# Patient Record
Sex: Female | Born: 1945
Health system: Southern US, Community
[De-identification: ages and names within clinical notes are randomized; demographics above are authoritative.]

## PROBLEM LIST (undated history)

## (undated) DIAGNOSIS — K5790 Diverticulosis of intestine, part unspecified, without perforation or abscess without bleeding: Secondary | ICD-10-CM

## (undated) DIAGNOSIS — K219 Gastro-esophageal reflux disease without esophagitis: Secondary | ICD-10-CM

## (undated) DIAGNOSIS — H269 Unspecified cataract: Secondary | ICD-10-CM

## (undated) DIAGNOSIS — M1990 Unspecified osteoarthritis, unspecified site: Secondary | ICD-10-CM

## (undated) DIAGNOSIS — T8859XA Other complications of anesthesia, initial encounter: Secondary | ICD-10-CM

## (undated) DIAGNOSIS — I10 Essential (primary) hypertension: Secondary | ICD-10-CM

## (undated) DIAGNOSIS — L80 Vitiligo: Secondary | ICD-10-CM

## (undated) DIAGNOSIS — K200 Eosinophilic esophagitis: Secondary | ICD-10-CM

## (undated) DIAGNOSIS — E079 Disorder of thyroid, unspecified: Secondary | ICD-10-CM

## (undated) DIAGNOSIS — Z9889 Other specified postprocedural states: Secondary | ICD-10-CM

## (undated) DIAGNOSIS — Z9289 Personal history of other medical treatment: Secondary | ICD-10-CM

## (undated) DIAGNOSIS — M8580 Other specified disorders of bone density and structure, unspecified site: Secondary | ICD-10-CM

## (undated) DIAGNOSIS — M62838 Other muscle spasm: Secondary | ICD-10-CM

## (undated) DIAGNOSIS — E785 Hyperlipidemia, unspecified: Secondary | ICD-10-CM

## (undated) DIAGNOSIS — T7840XA Allergy, unspecified, initial encounter: Secondary | ICD-10-CM

## (undated) HISTORY — PX: ABDOMINAL HYSTERECTOMY: SHX81

## (undated) HISTORY — DX: Hyperlipidemia, unspecified: E78.5

## (undated) HISTORY — PX: COLONOSCOPY: SHX174

## (undated) HISTORY — DX: Disorder of thyroid, unspecified: E07.9

## (undated) HISTORY — DX: Essential (primary) hypertension: I10

## (undated) HISTORY — PX: FRACTURE SURGERY: SHX138

## (undated) HISTORY — PX: BREAST SURGERY: SHX581

## (undated) HISTORY — DX: Unspecified osteoarthritis, unspecified site: M19.90

## (undated) HISTORY — PX: CHOLECYSTECTOMY: SHX55

## (undated) HISTORY — PX: TUBAL LIGATION: SHX77

## (undated) HISTORY — PX: ROTATOR CUFF REPAIR: SHX139

## (undated) HISTORY — DX: Unspecified cataract: H26.9

## (undated) HISTORY — PX: ESOPHAGEAL DILATION: SHX303

## (undated) HISTORY — DX: Gastro-esophageal reflux disease without esophagitis: K21.9

## (undated) HISTORY — DX: Allergy, unspecified, initial encounter: T78.40XA

## (undated) HISTORY — PX: APPENDECTOMY: SHX54

## (undated) HISTORY — DX: Diverticulosis of intestine, part unspecified, without perforation or abscess without bleeding: K57.90

## (undated) HISTORY — PX: UPPER GASTROINTESTINAL ENDOSCOPY: SHX188

## (undated) HISTORY — DX: Eosinophilic esophagitis: K20.0

## (undated) HISTORY — PX: BREAST EXCISIONAL BIOPSY: SUR124

## (undated) HISTORY — PX: EYE SURGERY: SHX253

## (undated) HISTORY — DX: Vitiligo: L80

## (undated) HISTORY — DX: Other muscle spasm: M62.838

## (undated) HISTORY — DX: Other specified disorders of bone density and structure, unspecified site: M85.80

## (undated) HISTORY — DX: Personal history of other medical treatment: Z92.89

---

## 1966-08-12 DIAGNOSIS — Z9289 Personal history of other medical treatment: Secondary | ICD-10-CM

## 1966-08-12 HISTORY — DX: Personal history of other medical treatment: Z92.89

## 1970-08-12 HISTORY — PX: BREAST EXCISIONAL BIOPSY: SUR124

## 1990-08-12 DIAGNOSIS — T7840XA Allergy, unspecified, initial encounter: Secondary | ICD-10-CM

## 1990-08-12 HISTORY — DX: Allergy, unspecified, initial encounter: T78.40XA

## 1997-12-21 ENCOUNTER — Other Ambulatory Visit: Admission: RE | Admit: 1997-12-21 | Discharge: 1997-12-21 | Payer: Self-pay | Admitting: Obstetrics and Gynecology

## 1998-12-26 ENCOUNTER — Other Ambulatory Visit: Admission: RE | Admit: 1998-12-26 | Discharge: 1998-12-26 | Payer: Self-pay | Admitting: Obstetrics and Gynecology

## 1999-12-27 ENCOUNTER — Other Ambulatory Visit: Admission: RE | Admit: 1999-12-27 | Discharge: 1999-12-27 | Payer: Self-pay | Admitting: Obstetrics and Gynecology

## 2000-09-10 ENCOUNTER — Ambulatory Visit (HOSPITAL_COMMUNITY): Admission: RE | Admit: 2000-09-10 | Discharge: 2000-09-10 | Payer: Self-pay | Admitting: Gastroenterology

## 2000-09-10 ENCOUNTER — Encounter: Payer: Self-pay | Admitting: Gastroenterology

## 2001-04-24 ENCOUNTER — Ambulatory Visit (HOSPITAL_COMMUNITY): Admission: RE | Admit: 2001-04-24 | Discharge: 2001-04-24 | Payer: Self-pay | Admitting: Internal Medicine

## 2001-04-24 ENCOUNTER — Encounter: Payer: Self-pay | Admitting: Internal Medicine

## 2001-05-01 ENCOUNTER — Other Ambulatory Visit: Admission: RE | Admit: 2001-05-01 | Discharge: 2001-05-01 | Payer: Self-pay | Admitting: Obstetrics and Gynecology

## 2002-06-13 ENCOUNTER — Encounter: Admission: RE | Admit: 2002-06-13 | Discharge: 2002-06-13 | Payer: Self-pay | Admitting: Orthopedic Surgery

## 2002-06-13 ENCOUNTER — Encounter: Payer: Self-pay | Admitting: Orthopedic Surgery

## 2002-07-05 ENCOUNTER — Other Ambulatory Visit: Admission: RE | Admit: 2002-07-05 | Discharge: 2002-07-05 | Payer: Self-pay | Admitting: Obstetrics and Gynecology

## 2003-07-14 ENCOUNTER — Other Ambulatory Visit: Admission: RE | Admit: 2003-07-14 | Discharge: 2003-07-14 | Payer: Self-pay | Admitting: Obstetrics and Gynecology

## 2003-09-26 ENCOUNTER — Encounter: Payer: Self-pay | Admitting: Gastroenterology

## 2003-11-15 ENCOUNTER — Encounter: Payer: Self-pay | Admitting: Internal Medicine

## 2003-11-25 ENCOUNTER — Encounter: Admission: RE | Admit: 2003-11-25 | Discharge: 2003-11-25 | Payer: Self-pay | Admitting: Internal Medicine

## 2004-03-20 ENCOUNTER — Encounter: Payer: Self-pay | Admitting: Internal Medicine

## 2004-06-27 ENCOUNTER — Ambulatory Visit: Payer: Self-pay | Admitting: Internal Medicine

## 2004-08-22 ENCOUNTER — Other Ambulatory Visit: Admission: RE | Admit: 2004-08-22 | Discharge: 2004-08-22 | Payer: Self-pay | Admitting: Obstetrics and Gynecology

## 2004-08-30 ENCOUNTER — Ambulatory Visit: Payer: Self-pay | Admitting: Internal Medicine

## 2004-09-11 ENCOUNTER — Ambulatory Visit: Payer: Self-pay | Admitting: Internal Medicine

## 2004-10-29 ENCOUNTER — Ambulatory Visit: Payer: Self-pay | Admitting: Family Medicine

## 2004-12-19 ENCOUNTER — Ambulatory Visit: Payer: Self-pay | Admitting: Internal Medicine

## 2004-12-25 ENCOUNTER — Ambulatory Visit: Payer: Self-pay | Admitting: Internal Medicine

## 2005-03-27 ENCOUNTER — Ambulatory Visit: Payer: Self-pay | Admitting: Internal Medicine

## 2005-04-03 ENCOUNTER — Ambulatory Visit: Payer: Self-pay | Admitting: Internal Medicine

## 2005-04-29 ENCOUNTER — Ambulatory Visit: Payer: Self-pay | Admitting: Gastroenterology

## 2005-05-23 ENCOUNTER — Ambulatory Visit: Payer: Self-pay | Admitting: Internal Medicine

## 2005-09-04 ENCOUNTER — Other Ambulatory Visit: Admission: RE | Admit: 2005-09-04 | Discharge: 2005-09-04 | Payer: Self-pay | Admitting: Obstetrics and Gynecology

## 2005-10-07 ENCOUNTER — Ambulatory Visit: Payer: Self-pay | Admitting: Gastroenterology

## 2005-10-21 ENCOUNTER — Ambulatory Visit: Payer: Self-pay | Admitting: Gastroenterology

## 2005-10-21 LAB — HM COLONOSCOPY

## 2005-11-19 ENCOUNTER — Ambulatory Visit: Payer: Self-pay | Admitting: Internal Medicine

## 2005-11-22 ENCOUNTER — Ambulatory Visit: Payer: Self-pay | Admitting: Internal Medicine

## 2006-01-15 ENCOUNTER — Ambulatory Visit: Payer: Self-pay | Admitting: Internal Medicine

## 2006-01-30 ENCOUNTER — Ambulatory Visit: Payer: Self-pay | Admitting: Internal Medicine

## 2006-02-03 ENCOUNTER — Ambulatory Visit: Payer: Self-pay | Admitting: Internal Medicine

## 2006-12-31 DIAGNOSIS — M899 Disorder of bone, unspecified: Secondary | ICD-10-CM

## 2006-12-31 DIAGNOSIS — M949 Disorder of cartilage, unspecified: Secondary | ICD-10-CM

## 2006-12-31 DIAGNOSIS — L80 Vitiligo: Secondary | ICD-10-CM

## 2007-01-07 ENCOUNTER — Ambulatory Visit: Payer: Self-pay | Admitting: Internal Medicine

## 2007-01-07 ENCOUNTER — Encounter: Payer: Self-pay | Admitting: Internal Medicine

## 2007-01-07 LAB — CONVERTED CEMR LAB
ALT: 19 units/L (ref 0–40)
AST: 21 units/L (ref 0–37)
Albumin: 3.9 g/dL (ref 3.5–5.2)
Alkaline Phosphatase: 73 units/L (ref 39–117)
BUN: 9 mg/dL (ref 6–23)
Basophils Absolute: 0 10*3/uL (ref 0.0–0.1)
Basophils Relative: 0.1 % (ref 0.0–1.0)
Bilirubin, Direct: 0.1 mg/dL (ref 0.0–0.3)
CO2: 32 meq/L (ref 19–32)
Calcium: 8.9 mg/dL (ref 8.4–10.5)
Chloride: 105 meq/L (ref 96–112)
Cholesterol: 179 mg/dL (ref 0–200)
Creatinine, Ser: 0.8 mg/dL (ref 0.4–1.2)
Eosinophils Absolute: 0.2 10*3/uL (ref 0.0–0.6)
Eosinophils Relative: 3.3 % (ref 0.0–5.0)
GFR calc Af Amer: 94 mL/min
GFR calc non Af Amer: 78 mL/min
Glucose, Bld: 104 mg/dL — ABNORMAL HIGH (ref 70–99)
HCT: 43 % (ref 36.0–46.0)
HDL: 53.3 mg/dL (ref >39.0)
Hemoglobin: 14.7 g/dL (ref 12.0–15.0)
Hgb A1c MFr Bld: 5.7 % (ref 4.6–6.0)
LDL Cholesterol: 106 mg/dL — ABNORMAL HIGH (ref 0–99)
Lymphocytes Relative: 23.2 % (ref 12.0–46.0)
MCHC: 34.2 g/dL (ref 30.0–36.0)
MCV: 89.2 fL (ref 78.0–100.0)
Monocytes Absolute: 0.7 10*3/uL (ref 0.2–0.7)
Monocytes Relative: 10.7 % (ref 3.0–11.0)
Neutro Abs: 3.9 10*3/uL (ref 1.4–7.7)
Neutrophils Relative %: 62.7 % (ref 43.0–77.0)
Platelets: 286 10*3/uL (ref 150–400)
Potassium: 4 meq/L (ref 3.5–5.1)
RBC: 4.82 M/uL (ref 3.87–5.11)
RDW: 12.1 % (ref 11.5–14.6)
Sodium: 141 meq/L (ref 135–145)
TSH: 4.77 microintl units/mL (ref 0.35–5.50)
Total Bilirubin: 0.7 mg/dL (ref 0.3–1.2)
Total CHOL/HDL Ratio: 3.4
Total Protein: 6.7 g/dL (ref 6.0–8.3)
Triglycerides: 99 mg/dL (ref 0–149)
VLDL: 20 mg/dL (ref 0–40)
WBC: 6.3 10*3/uL (ref 4.5–10.5)

## 2007-01-19 ENCOUNTER — Ambulatory Visit: Payer: Self-pay | Admitting: Gastroenterology

## 2007-01-21 ENCOUNTER — Ambulatory Visit: Payer: Self-pay | Admitting: Internal Medicine

## 2007-01-21 DIAGNOSIS — L259 Unspecified contact dermatitis, unspecified cause: Secondary | ICD-10-CM

## 2007-01-23 ENCOUNTER — Telehealth (INDEPENDENT_AMBULATORY_CARE_PROVIDER_SITE_OTHER): Payer: Self-pay | Admitting: *Deleted

## 2007-01-27 ENCOUNTER — Telehealth (INDEPENDENT_AMBULATORY_CARE_PROVIDER_SITE_OTHER): Payer: Self-pay | Admitting: *Deleted

## 2007-12-10 ENCOUNTER — Ambulatory Visit: Payer: Self-pay | Admitting: Internal Medicine

## 2007-12-26 DIAGNOSIS — K219 Gastro-esophageal reflux disease without esophagitis: Secondary | ICD-10-CM | POA: Insufficient documentation

## 2007-12-26 DIAGNOSIS — K5730 Diverticulosis of large intestine without perforation or abscess without bleeding: Secondary | ICD-10-CM

## 2007-12-26 DIAGNOSIS — E785 Hyperlipidemia, unspecified: Secondary | ICD-10-CM

## 2007-12-26 DIAGNOSIS — E039 Hypothyroidism, unspecified: Secondary | ICD-10-CM

## 2007-12-26 DIAGNOSIS — K319 Disease of stomach and duodenum, unspecified: Secondary | ICD-10-CM

## 2007-12-26 DIAGNOSIS — I10 Essential (primary) hypertension: Secondary | ICD-10-CM | POA: Insufficient documentation

## 2007-12-28 ENCOUNTER — Encounter (INDEPENDENT_AMBULATORY_CARE_PROVIDER_SITE_OTHER): Payer: Self-pay | Admitting: *Deleted

## 2008-02-09 ENCOUNTER — Telehealth: Payer: Self-pay | Admitting: Gastroenterology

## 2008-02-10 ENCOUNTER — Telehealth (INDEPENDENT_AMBULATORY_CARE_PROVIDER_SITE_OTHER): Payer: Self-pay | Admitting: *Deleted

## 2008-03-01 ENCOUNTER — Ambulatory Visit: Payer: Self-pay | Admitting: Internal Medicine

## 2008-03-07 ENCOUNTER — Telehealth: Payer: Self-pay | Admitting: Gastroenterology

## 2008-03-16 ENCOUNTER — Telehealth (INDEPENDENT_AMBULATORY_CARE_PROVIDER_SITE_OTHER): Payer: Self-pay | Admitting: *Deleted

## 2008-04-13 ENCOUNTER — Ambulatory Visit: Payer: Self-pay | Admitting: Gastroenterology

## 2008-04-13 ENCOUNTER — Encounter: Payer: Self-pay | Admitting: Internal Medicine

## 2008-05-05 ENCOUNTER — Ambulatory Visit: Payer: Self-pay | Admitting: Internal Medicine

## 2008-05-17 ENCOUNTER — Telehealth (INDEPENDENT_AMBULATORY_CARE_PROVIDER_SITE_OTHER): Payer: Self-pay | Admitting: *Deleted

## 2008-05-17 LAB — CONVERTED CEMR LAB
ALT: 28 units/L (ref 0–35)
AST: 29 units/L (ref 0–37)
Albumin: 3.6 g/dL (ref 3.5–5.2)
Alkaline Phosphatase: 71 units/L (ref 39–117)
BUN: 9 mg/dL (ref 6–23)
Basophils Absolute: 0 10*3/uL (ref 0.0–0.1)
Basophils Relative: 0.9 % (ref 0.0–3.0)
Bilirubin, Direct: 0.1 mg/dL (ref 0.0–0.3)
CO2: 30 meq/L (ref 19–32)
Calcium: 9 mg/dL (ref 8.4–10.5)
Chloride: 108 meq/L (ref 96–112)
Cholesterol: 134 mg/dL (ref 0–200)
Creatinine, Ser: 0.7 mg/dL (ref 0.4–1.2)
Eosinophils Absolute: 0.3 10*3/uL (ref 0.0–0.7)
Eosinophils Relative: 6 % — ABNORMAL HIGH (ref 0.0–5.0)
GFR calc Af Amer: 109 mL/min
GFR calc non Af Amer: 90 mL/min
Glucose, Bld: 97 mg/dL (ref 70–99)
HCT: 40 % (ref 36.0–46.0)
HDL: 42.4 mg/dL (ref >39.0)
Hemoglobin: 13.8 g/dL (ref 12.0–15.0)
Hgb A1c MFr Bld: 5.6 % (ref 4.6–6.0)
LDL Cholesterol: 74 mg/dL (ref 0–99)
Lymphocytes Relative: 25.3 % (ref 12.0–46.0)
MCHC: 34.4 g/dL (ref 30.0–36.0)
MCV: 89.5 fL (ref 78.0–100.0)
Monocytes Absolute: 0.7 10*3/uL (ref 0.1–1.0)
Monocytes Relative: 13.5 % — ABNORMAL HIGH (ref 3.0–12.0)
Neutro Abs: 3 10*3/uL (ref 1.4–7.7)
Neutrophils Relative %: 54.3 % (ref 43.0–77.0)
Platelets: 251 10*3/uL (ref 150–400)
Potassium: 4.3 meq/L (ref 3.5–5.1)
RBC: 4.47 M/uL (ref 3.87–5.11)
RDW: 13.1 % (ref 11.5–14.6)
Sodium: 144 meq/L (ref 135–145)
TSH: 1.95 microintl units/mL (ref 0.35–5.50)
Total Bilirubin: 0.6 mg/dL (ref 0.3–1.2)
Total CHOL/HDL Ratio: 3.2
Total Protein: 6.5 g/dL (ref 6.0–8.3)
Triglycerides: 86 mg/dL (ref 0–149)
VLDL: 17 mg/dL (ref 0–40)
WBC: 5.3 10*3/uL (ref 4.5–10.5)

## 2008-05-18 ENCOUNTER — Encounter (INDEPENDENT_AMBULATORY_CARE_PROVIDER_SITE_OTHER): Payer: Self-pay | Admitting: *Deleted

## 2008-08-15 ENCOUNTER — Telehealth (INDEPENDENT_AMBULATORY_CARE_PROVIDER_SITE_OTHER): Payer: Self-pay | Admitting: *Deleted

## 2008-08-31 ENCOUNTER — Ambulatory Visit: Payer: Self-pay | Admitting: Internal Medicine

## 2009-03-15 ENCOUNTER — Telehealth (INDEPENDENT_AMBULATORY_CARE_PROVIDER_SITE_OTHER): Payer: Self-pay | Admitting: *Deleted

## 2009-05-08 ENCOUNTER — Ambulatory Visit: Payer: Self-pay | Admitting: Internal Medicine

## 2009-05-08 LAB — CONVERTED CEMR LAB
ALT: 19 units/L (ref 0–35)
AST: 24 units/L (ref 0–37)
Albumin: 3.9 g/dL (ref 3.5–5.2)
Alkaline Phosphatase: 74 units/L (ref 39–117)
BUN: 11 mg/dL (ref 6–23)
Basophils Absolute: 0 10*3/uL (ref 0.0–0.1)
Basophils Relative: 0.6 % (ref 0.0–3.0)
Bilirubin, Direct: 0 mg/dL (ref 0.0–0.3)
CO2: 32 meq/L (ref 19–32)
Calcium: 9 mg/dL (ref 8.4–10.5)
Chloride: 106 meq/L (ref 96–112)
Cholesterol: 136 mg/dL (ref 0–200)
Creatinine, Ser: 0.7 mg/dL (ref 0.4–1.2)
Eosinophils Absolute: 0.4 10*3/uL (ref 0.0–0.7)
Eosinophils Relative: 6.9 % — ABNORMAL HIGH (ref 0.0–5.0)
GFR calc non Af Amer: 89.57 mL/min (ref >60)
Glucose, Bld: 92 mg/dL (ref 70–99)
HCT: 42.5 % (ref 36.0–46.0)
HDL: 41 mg/dL (ref >39.00)
Hemoglobin: 14.4 g/dL (ref 12.0–15.0)
LDL Cholesterol: 72 mg/dL (ref 0–99)
Lymphocytes Relative: 25.8 % (ref 12.0–46.0)
Lymphs Abs: 1.5 10*3/uL (ref 0.7–4.0)
MCHC: 34 g/dL (ref 30.0–36.0)
MCV: 89.7 fL (ref 78.0–100.0)
Monocytes Absolute: 0.6 10*3/uL (ref 0.1–1.0)
Monocytes Relative: 10.1 % (ref 3.0–12.0)
Neutro Abs: 3.2 10*3/uL (ref 1.4–7.7)
Neutrophils Relative %: 56.6 % (ref 43.0–77.0)
Platelets: 231 10*3/uL (ref 150.0–400.0)
Potassium: 4 meq/L (ref 3.5–5.1)
RBC: 4.73 M/uL (ref 3.87–5.11)
RDW: 12.5 % (ref 11.5–14.6)
Sodium: 142 meq/L (ref 135–145)
TSH: 7.64 microintl units/mL — ABNORMAL HIGH (ref 0.35–5.50)
Total Bilirubin: 0.7 mg/dL (ref 0.3–1.2)
Total CHOL/HDL Ratio: 3
Total Protein: 6.9 g/dL (ref 6.0–8.3)
Triglycerides: 117 mg/dL (ref 0.0–149.0)
VLDL: 23.4 mg/dL (ref 0.0–40.0)
WBC: 5.7 10*3/uL (ref 4.5–10.5)

## 2009-05-12 ENCOUNTER — Telehealth (INDEPENDENT_AMBULATORY_CARE_PROVIDER_SITE_OTHER): Payer: Self-pay | Admitting: *Deleted

## 2009-05-16 ENCOUNTER — Ambulatory Visit: Payer: Self-pay | Admitting: Internal Medicine

## 2009-05-16 DIAGNOSIS — E559 Vitamin D deficiency, unspecified: Secondary | ICD-10-CM

## 2009-05-29 ENCOUNTER — Telehealth: Payer: Self-pay | Admitting: Gastroenterology

## 2009-05-31 ENCOUNTER — Encounter (INDEPENDENT_AMBULATORY_CARE_PROVIDER_SITE_OTHER): Payer: Self-pay | Admitting: *Deleted

## 2009-05-31 ENCOUNTER — Ambulatory Visit: Payer: Self-pay | Admitting: Gastroenterology

## 2009-05-31 DIAGNOSIS — R1319 Other dysphagia: Secondary | ICD-10-CM

## 2009-06-26 ENCOUNTER — Emergency Department: Payer: Self-pay | Admitting: Emergency Medicine

## 2009-06-28 ENCOUNTER — Telehealth: Payer: Self-pay | Admitting: Gastroenterology

## 2009-06-30 ENCOUNTER — Ambulatory Visit: Payer: Self-pay | Admitting: Gastroenterology

## 2009-07-11 ENCOUNTER — Encounter: Payer: Self-pay | Admitting: Gastroenterology

## 2009-07-11 ENCOUNTER — Ambulatory Visit: Payer: Self-pay | Admitting: Internal Medicine

## 2009-07-19 ENCOUNTER — Encounter (INDEPENDENT_AMBULATORY_CARE_PROVIDER_SITE_OTHER): Payer: Self-pay | Admitting: *Deleted

## 2009-07-19 LAB — CONVERTED CEMR LAB: Vit D, 25-Hydroxy: 39 ng/mL (ref 30–89)

## 2009-07-24 LAB — CONVERTED CEMR LAB: TSH: 2.45 microintl units/mL (ref 0.35–5.50)

## 2009-07-26 ENCOUNTER — Ambulatory Visit: Payer: Self-pay | Admitting: Internal Medicine

## 2009-08-02 ENCOUNTER — Telehealth (INDEPENDENT_AMBULATORY_CARE_PROVIDER_SITE_OTHER): Payer: Self-pay | Admitting: *Deleted

## 2009-09-21 ENCOUNTER — Telehealth (INDEPENDENT_AMBULATORY_CARE_PROVIDER_SITE_OTHER): Payer: Self-pay | Admitting: *Deleted

## 2009-12-19 ENCOUNTER — Encounter: Payer: Self-pay | Admitting: Gastroenterology

## 2010-03-02 ENCOUNTER — Telehealth (INDEPENDENT_AMBULATORY_CARE_PROVIDER_SITE_OTHER): Payer: Self-pay | Admitting: *Deleted

## 2010-03-22 ENCOUNTER — Ambulatory Visit: Payer: Self-pay | Admitting: Internal Medicine

## 2010-03-22 ENCOUNTER — Encounter: Payer: Self-pay | Admitting: Internal Medicine

## 2010-05-07 ENCOUNTER — Ambulatory Visit: Payer: Self-pay | Admitting: Internal Medicine

## 2010-05-08 LAB — CONVERTED CEMR LAB
ALT: 20 units/L (ref 0–35)
AST: 22 units/L (ref 0–37)
Albumin: 3.8 g/dL (ref 3.5–5.2)
Alkaline Phosphatase: 79 units/L (ref 39–117)
BUN: 13 mg/dL (ref 6–23)
Basophils Absolute: 0.1 10*3/uL (ref 0.0–0.1)
Basophils Relative: 1.4 % (ref 0.0–3.0)
Bilirubin, Direct: 0.1 mg/dL (ref 0.0–0.3)
CO2: 32 meq/L (ref 19–32)
Calcium: 9.3 mg/dL (ref 8.4–10.5)
Chloride: 103 meq/L (ref 96–112)
Cholesterol: 141 mg/dL (ref 0–200)
Creatinine, Ser: 0.6 mg/dL (ref 0.4–1.2)
Eosinophils Absolute: 0.4 10*3/uL (ref 0.0–0.7)
Eosinophils Relative: 8.5 % — ABNORMAL HIGH (ref 0.0–5.0)
GFR calc non Af Amer: 99.02 mL/min (ref >60)
Glucose, Bld: 86 mg/dL (ref 70–99)
HCT: 42.5 % (ref 36.0–46.0)
HDL: 41.4 mg/dL (ref >39.00)
Hemoglobin: 14.5 g/dL (ref 12.0–15.0)
LDL Cholesterol: 79 mg/dL (ref 0–99)
Lymphocytes Relative: 31.8 % (ref 12.0–46.0)
Lymphs Abs: 1.6 10*3/uL (ref 0.7–4.0)
MCHC: 34.1 g/dL (ref 30.0–36.0)
MCV: 89.2 fL (ref 78.0–100.0)
Monocytes Absolute: 0.6 10*3/uL (ref 0.1–1.0)
Monocytes Relative: 11.8 % (ref 3.0–12.0)
Neutro Abs: 2.4 10*3/uL (ref 1.4–7.7)
Neutrophils Relative %: 46.5 % (ref 43.0–77.0)
Platelets: 236 10*3/uL (ref 150.0–400.0)
Potassium: 5 meq/L (ref 3.5–5.1)
RBC: 4.77 M/uL (ref 3.87–5.11)
RDW: 13.7 % (ref 11.5–14.6)
Sodium: 140 meq/L (ref 135–145)
TSH: 7.84 microintl units/mL — ABNORMAL HIGH (ref 0.35–5.50)
Total Bilirubin: 0.2 mg/dL — ABNORMAL LOW (ref 0.3–1.2)
Total CHOL/HDL Ratio: 3
Total Protein: 6.4 g/dL (ref 6.0–8.3)
Triglycerides: 101 mg/dL (ref 0.0–149.0)
VLDL: 20.2 mg/dL (ref 0.0–40.0)
WBC: 5.2 10*3/uL (ref 4.5–10.5)

## 2010-05-21 ENCOUNTER — Encounter: Payer: Self-pay | Admitting: Internal Medicine

## 2010-05-21 ENCOUNTER — Ambulatory Visit: Payer: Self-pay | Admitting: Internal Medicine

## 2010-05-21 DIAGNOSIS — M5010 Cervical disc disorder with radiculopathy, unspecified cervical region: Secondary | ICD-10-CM

## 2010-05-21 LAB — CONVERTED CEMR LAB
HDL goal, serum: 40 mg/dL
LDL Goal: 130 mg/dL

## 2010-06-20 ENCOUNTER — Telehealth: Payer: Self-pay | Admitting: Gastroenterology

## 2010-06-21 ENCOUNTER — Ambulatory Visit: Payer: Self-pay | Admitting: Internal Medicine

## 2010-08-01 ENCOUNTER — Ambulatory Visit: Payer: Self-pay | Admitting: Gastroenterology

## 2010-09-11 NOTE — Progress Notes (Signed)
Summary: cretore refill   Phone Note Refill Request Message from:  Fax from Pharmacy on [facility] FAX [phone]  Refills Requested: Medication #1:  CRESTOR 20 MG TABS 1/2 tab qd Initial call taken by: Barb Merino,  September 21, 2009 8:46 AM    Prescriptions: CRESTOR 20 MG TABS (ROSUVASTATIN CALCIUM) 1/2 tab qd  #90 x 1   Entered by:   Doristine Devoid   Authorized by:   Marga Melnick MD   Signed by:   Doristine Devoid on 09/21/2009   Method used:   Faxed to ...       [facility] (mail[facility]order)       [address] [place], [place]  [address]       Ph: [phone]       Fax: [phone]   RxID:   [id number]

## 2010-09-11 NOTE — Assessment & Plan Note (Signed)
Summary: PNEMONIA SHOT/KN   Nurse Visit  CC: Pneumo vacc./kb   Allergies: 1)  ! * Boniva 2)  ! Fosamax (Alendronate Sodium)  Immunizations Administered:  Pneumonia Vaccine:    Vaccine Type: Pneumovax    Site: left deltoid    Mfr: Merck    Dose: 0.5 ml    Route: IM    Given by: Lucious Groves CMA    Exp. Date: 10/27/2011    Lot #: [id number]    VIS given: 07/17/09 version given June 21, 2010.  Orders Added: 1)  Pneumococcal Vaccine [90732] 2)  Admin 1st Vaccine [04540]

## 2010-09-11 NOTE — Medication Information (Signed)
Summary: Prilosec Approved/[facility]  Prilosec Approved/[facility]   Imported By: Sherian Rein 12/27/2009 14:30:02  _____________________________________________________________________  External Attachment:    Type:   Image     Comment:   External Document

## 2010-09-11 NOTE — Progress Notes (Signed)
Summary: Medication Refill  Medications Added OMEPRAZOLE 40 MG CPDR (OMEPRAZOLE) one tablet by mouth once daily       Phone Note Call from Patient Call back at Home Phone [phone]   Caller: Patient Call For: Dr. Russella Dar Reason for Call: Refill Medication Details for Reason: Medication Refill Summary of Call: Pt. needs new prescription for Omeprazole as her insurance is changing. She requests that it be written for 90 days and also asks if she can come by and pick up a written prescription instead of calling it into her pharmacy. Please call pt. when the prescription is ready to be picked up.  Thank you. Initial call taken by: Schuyler Amor,  June 20, 2010 10:08 AM  Follow-up for Phone Call        Left message for patient  to call back and discuss omeprazole refills and scheduling her yearly follow up visit.  Follow-up by: Christie Nottingham CMA Duncan Dull),  June 20, 2010 1:37 PM  Additional Follow-up for Phone Call Additional follow up Details #1::        Scheduled appt on 08/01/10 at 10:00am and pt's wants a printed prescription for a one month supply until her office visit. Printed and left out front for pt to pick up. Additional Follow-up by: Christie Nottingham CMA Duncan Dull),  June 20, 2010 3:03 PM    New/Updated Medications: OMEPRAZOLE 40 MG CPDR (OMEPRAZOLE) one tablet by mouth once daily Prescriptions: OMEPRAZOLE 40 MG CPDR (OMEPRAZOLE) one tablet by mouth once daily  #30 x 0   Entered by:   Christie Nottingham CMA (AAMA)   Authorized by:   Meryl Dare MD [facility]   Signed by:   Christie Nottingham CMA (AAMA) on 06/20/2010   Method used:   Print then Give to Patient   RxID:   [id number]

## 2010-09-11 NOTE — Progress Notes (Signed)
Summary: ORDERS FOR LABS TO BE INTO THE SLOT  Phone Note Call from Patient   Summary of Call: PT CALL AND HAD HER CPX CHANGE FROM NOV-2011 TO 05/17/10 AND THEN MADE AN APPT FOR LABS TO BE DONE 05-07-2010 NEED ORDERS TO BE PUTINTHE SLOT FOR THAT DAY AND SHE KNOWS SHE NEED TO BE FASTING. Initial call taken by: Freddy Jaksch,  March 02, 2010 10:20 AM  Follow-up for Phone Call        TLB-Lipid Panel [80061-LIPID] TLB-BMP (Basic Metabolic Panel-BMET) [80048-METABOL] TLB-TSH (Thyroid Stimulating Hormone) [84443-TSH] TLB-CBC Platelet - w/Differential [85025-CBCD] TLB-Hepatic/Liver Function Pnl [80076-HEPATIC] Stool Cards V70.0/ 244.9/ 272.4/ 401.9/ 995.20 Follow-up by: Shonna Chock CMA,  March 05, 2010 1:50 PM  Additional Follow-up for Phone Call Additional follow up Details #1::        oders place in patient chart. Additional Follow-up by: Freddy Jaksch,  March 07, 2010 7:36 AM

## 2010-09-11 NOTE — Miscellaneous (Signed)
Summary: BONE DENSITY  Clinical Lists Changes  Orders: Added new Test order of T-Bone Densitometry (77080) - Signed Added new Test order of T-Lumbar Vertebral Assessment (77082) - Signed 

## 2010-09-11 NOTE — Assessment & Plan Note (Signed)
Summary: cpx/fasting//kn   Vital Signs:  Patient profile:   65 year old female Height:      63.25 inches Weight:      147 pounds BMI:     25.93 Temp:     98.5 degrees F oral Pulse rate:   76 / minute Resp:     14 per minute BP sitting:   136 / 88  (left arm) Cuff size:   large  Vitals Entered By: Shonna Chock CMA (May 21, 2010 1:07 PM)   Primary Care Provider:  Marga Melnick, MD  CC:  Heartburn.  History of Present Illness: Crystal Kidd is here for a physical; her only concern is intermittent numbness  in R thigh, better with ambulation  for past  6-7 months.      She also  presents  for ERD F/U.  The patient reports some  acid reflux, but denies sour taste in mouth, epigastric pain, chest pain, trouble swallowing, weight loss, and weight gain.  The patient denies the following alarm features: melena, dysphagia, hematemesis, and vomiting.  Symptoms are worse with citrus.  Prior evaluation has included EGD ( see PMH).  The patient has found the following treatments to be effective: a PPI.        The patient also presents for Hyperlipidemia follow-up.  The patient reports muscle aches, but denies flushing, itching, constipation, diarrhea, and fatigue.  The patient denies the following symptoms: exercise intolerance, dypsnea, palpitations, syncope, and pedal edema.  Compliance with medications (by patient report) has been near 100%.  Dietary compliance has been good.       The patient also presents for Hypertension follow-up.  The patient denies lightheadedness, urinary frequency, and headaches.  Adjunctive measures currently used by the patient include  modified salt restriction.  Off meds BP averages 118/70-128/80 @ home.  Lipid Management History:      Positive NCEP/ATP III risk factors include female age 54 years old or older and hypertension.  Negative NCEP/ATP III risk factors include non-diabetic, no family history for ischemic heart disease, non-tobacco-user status, no ASHD  (atherosclerotic heart disease), no prior stroke/TIA, no peripheral vascular disease, and no history of aortic aneurysm.     Current Medications (verified): 1)  Omeprazole 40 Mg Cpdr (Omeprazole) .... One Tablet By Mouth Once Daily 2)  Evista 60 Mg Tabs (Raloxifene Hcl) .Marland Kitchen.. 1 By Mouth Qd 3)  Multivitamin With Iron 4)  Hydrochlorothiazide 25 Mg Tabs (Hydrochlorothiazide) .... 1/2 Tab Once Daily Prn 5)  Crestor 20 Mg Tabs (Rosuvastatin Calcium) .... 1/2 Tab Qd 6)  Levothroid 50 Mcg Tabs (Levothyroxine Sodium) .Marland Kitchen.. 1 By Mouth Once Daily Except 1 & 1/2 On Weds 7)  Zyrtec Allergy 10 Mg  Tabs (Cetirizine Hcl) .Marland Kitchen.. 1 By Mouth Once Daily 8)  Vitamin D 1000 Unit  Tabs (Cholecalciferol) .Marland Kitchen.. 1 By Mouth Once Daily 9)  Tums 500 Mg  Chew (Calcium Carbonate Antacid) .... 2 By Mouth Once Daily 10)  Adult Gummy Calcium .... 500mg  Calcium, 1000 Vit D. 1 By Mouth Once Daily  Allergies: 1)  ! * Boniva 2)  ! Fosamax (Alendronate Sodium)  Past History:  Past Medical History: Diverticulosis  GERD with stricture Allergic rhinitis Hyperlipidemia: [place] Study LDL goal = < 130. NMR 2005: LDL 045(4098/119), HDL 50, TG 154. LDL goal = < 140. Hypertension Hypothyroidism Vitiligo ; Osteopenia: T score -2.1 @ femoral neck 2011 ; Vitamin D deficiency  Past Surgical History: Rotator cuff repair R shoulder Hysterectomy & USO for dysfunctional menses &  scar tissue  breast biopsy X 2 ; Esophageal dilation X2 ; Colonoscopy X2 : diverticulosis , Dr Russella Dar, due 2012  Family History: M breast & stomach cancer; s: breast cancer ;M aunt: colon cancer; F: COPD;cousin: DM; M aunt : cancer ? primary, benign cns tumor  Social History: Occupation:[employer] , part time Patient has never smoked.  Alcohol Use :yes- wine rarely Daily Caffeine Use -1 cup or < Patient gets regular exercise: as walking, biking>30 min , 3-5  X/week  Widowed ;2 sons  Review of Systems General:  Denies chills, fever, sleep  disorder, and sweats. Eyes:  Complains of blurring; denies double vision and vision loss-both eyes; Dry eyes & early cataract as per Ophth, Dr Hazle Quant. ENT:  Denies hoarseness. Resp:  Denies cough, shortness of breath, and sputum productive. GU:  Denies discharge, dysuria, and hematuria. MS:  Complains of stiffness; denies joint pain, joint redness, joint swelling, mid back pain, and thoracic pain; Occasional LBP. Derm:  Denies changes in nail beds, dryness, and hair loss. Neuro:  Denies brief paralysis, disturbances in coordination, falling down, poor balance, tingling, and weakness. Psych:  Denies anxiety and depression. Endo:  Complains of cold intolerance; denies heat intolerance. Heme:  Denies abnormal bruising and bleeding. Allergy:  Denies itching eyes and sneezing.  Physical Exam  General:  well-nourished; alert,appropriate and cooperative throughout examination Head:  Normocephalic and atraumatic without obvious abnormalities. Eyes:  No corneal or conjunctival inflammation noted. Perrla. Funduscopic exam benign, without hemorrhages, exudates or papilledema. Slight decreased light reflex OD. Some loss lateral eyebrows. No lid lag Ears:  External ear exam shows no significant lesions or deformities.  Otoscopic examination reveals clear canals, tympanic membranes are intact bilaterally without bulging, retraction, inflammation or discharge. Hearing is grossly normal bilaterally. Nose:  External nasal examination shows no deformity or inflammation. Nasal mucosa are pink and moist without lesions or exudates. Septal dislocation Mouth:  Oral mucosa and oropharynx without lesions or exudates.  Teeth in good repair.Upper & lower partials Neck:  No deformities, masses, or tenderness noted. Firm R goiter suggested Lungs:  Normal respiratory effort, chest expands symmetrically. Lungs are clear to auscultation, no crackles or wheezes. Heart:  Normal rate and regular rhythm. S1 and S2 normal without  gallop,  click, rub .S4 Grade 1/2  systolic murmur LSB Abdomen:  Bowel sounds positive,abdomen soft and non-tender without masses, organomegaly or hernias noted. Genitalia:  Dr Vincente Poli Msk:  No deformity or scoliosis noted of thoracic or lumbar spine.  Lay down & sat  w/o help Pulses:  R and L carotid,radial and posterior tibial pulses are full and equal bilaterally.DPP  decreased w/o ischemic changes Extremities:  No clubbing, cyanosis, edema, or deformity noted with normal full range of motion of all joints.   Neurologic:  alert & oriented X3, strength normal in all extremities, and DTRs symmetrical and normal.   Neg SLR  Skin:  Scattered vitiligo Cervical Nodes:  No lymphadenopathy noted Axillary Nodes:  No palpable lymphadenopathy Psych:  memory intact for recent and remote, normally interactive, and good eye contact.     Impression & Recommendations:  Problem # 1:  ROUTINE GENERAL MEDICAL EXAM@HEALTH  CARE FACL (ICD-V70.0)  Orders: EKG w/ Interpretation (93000)  Problem # 2:  LUMBAR RADICULOPATHY, RIGHT (ICD-724.4) intermittent ,probably from DDD  Problem # 3:  HYPERTENSION (ICD-401.9)  Her updated medication list for this problem includes:    Hydrochlorothiazide 25 Mg Tabs (Hydrochlorothiazide) .Marland Kitchen... 1/2 tab once daily prn  Problem # 4:  HYPOTHYROIDISM (  ICD-244.9)  Her updated medication list for this problem includes:    Levothroid 50 Mcg Tabs (Levothyroxine sodium) .Marland Kitchen... 1 by mouth once daily except 1 & 1/2 mon, weds & fri  Problem # 5:  DYSLIPIDEMIA (ICD-272.4)  The following medications were removed from the medication list:    Crestor 20 Mg Tabs (Rosuvastatin calcium) .Marland Kitchen... 1/2 tab qd  Problem # 6:  GERD (ICD-530.81)  Her updated medication list for this problem includes:    Omeprazole 40 Mg Cpdr (Omeprazole) ..... One tablet by mouth once daily    Tums 500 Mg Chew (Calcium carbonate antacid) .Marland Kitchen... 2 by mouth once daily  Complete Medication List: 1)  Omeprazole  40 Mg Cpdr (Omeprazole) .... One tablet by mouth once daily 2)  Evista 60 Mg Tabs (Raloxifene hcl) .Marland Kitchen.. 1 by mouth qd 3)  Multivitamin With Iron  4)  Hydrochlorothiazide 25 Mg Tabs (Hydrochlorothiazide) .... 1/2 tab once daily prn 5)  Levothroid 50 Mcg Tabs (Levothyroxine sodium) .Marland Kitchen.. 1 by mouth once daily except 1 & 1/2 mon, weds & fri 6)  Zyrtec Allergy 10 Mg Tabs (Cetirizine hcl) .Marland Kitchen.. 1 by mouth once daily 7)  Vitamin D 1000 Unit Tabs (Cholecalciferol) .Marland Kitchen.. 1 by mouth once daily 8)  Tums 500 Mg Chew (Calcium carbonate antacid) .... 2 by mouth once daily 9)  Adult Gummy Calcium  .... 500mg  calcium, 1000 vit d. 1 by mouth once daily  Other Orders: Admin 1st Vaccine (62130) Flu Vaccine 61yrs + (86578)  Lipid Assessment/Plan:      Based on NCEP/ATP III, the patient's risk factor category is "2 or more risk factors and a calculated 10 year CAD risk of < 20%".  The patient's lipid goals are as follows: Total cholesterol goal is 200; LDL cholesterol goal is 130; HDL cholesterol goal is 40; Triglyceride goal is 150.    Patient Instructions: 1)  Stop Crestor ; increase Thyroid as prescribed.Discuss  Evista with Dr Grewal.(Note : intolerant to Bisphosphonates X 2 trials) 2)  Please schedule a follow-up appointment in 4 months. 3)  Lipid Panel prior to visit, ICD-9:272.4 4)  TSH prior to visit, ICD-9:244.9 5)  Avoid foods high in acid (tomatoes, citrus juices, spicy foods). Avoid eating within two hours of lying down or before exercising. Do not over eat; try smaller more frequent meals. Elevate head of bed twelve inches when sleeping. Prescriptions: LEVOTHROID 50 MCG TABS (LEVOTHYROXINE SODIUM) 1 by mouth once daily except 1 & 1/2 Mon, Weds & Fri  #90 x 3   Entered and Authorized by:   Marga Melnick MD   Signed by:   Marga Melnick MD on 05/21/2010   Method used:   Print then Give to Patient   RxID:   [id number]  Flu Vaccine Consent Questions     Do you have a history of severe allergic  reactions to this vaccine? no    Any prior history of allergic reactions to egg and/or gelatin? no    Do you have a sensitivity to the preservative Thimersol? no    Do you have a past history of Guillan-Barre Syndrome? no    Do you currently have an acute febrile illness? no    Have you ever had a severe reaction to latex? no    Vaccine information given and explained to patient? yes    Are you currently pregnant? no    Lot Number:[id number]   Exp Date:02/09/2011   Site Given  Right Deltoid IMd by:   Marga Melnick MD  on 05/21/2010   Method used:   Print then Give to Patient   RxID:   [id number]    .lbflu

## 2010-09-13 NOTE — Assessment & Plan Note (Signed)
Summary: yearly GERD f/u   History of Present Illness Visit Type: Follow-up Visit Primary GI MD: Elie Goody MD [facility] Primary Provider: Marga Melnick, MD Chief Complaint: Yearly follow up for GERD and Omeprazole refills for 90day supply History of Present Illness:   This is a return office visit for GERD with a history of a peptic stricture. Her reflux symptoms are under excellent control on omeprazole 40 mg daily. She has no heartburn or dysphagia. She has no gastrointestinal complaints.   GI Review of Systems      Denies abdominal pain, acid reflux, belching, bloating, chest pain, dysphagia with liquids, dysphagia with solids, heartburn, loss of appetite, nausea, vomiting, vomiting blood, weight loss, and  weight gain.        Denies anal fissure, black tarry stools, change in bowel habit, constipation, diarrhea, diverticulosis, fecal incontinence, heme positive stool, hemorrhoids, irritable bowel syndrome, jaundice, light color stool, liver problems, rectal bleeding, and  rectal pain.   Current Medications (verified): 1)  Omeprazole 40 Mg Cpdr (Omeprazole) .... One Tablet By Mouth Once Daily 2)  Evista 60 Mg Tabs (Raloxifene Hcl) .Marland Kitchen.. 1 By Mouth Qd 3)  Multivitamin With Iron 4)  Hydrochlorothiazide 25 Mg Tabs (Hydrochlorothiazide) .... 1/2 Tab Once Daily Prn 5)  Levothroid 50 Mcg Tabs (Levothyroxine Sodium) .Marland Kitchen.. 1 By Mouth Once Daily Except 1 & 1/2 Mon, Weds & Fri 6)  Zyrtec Allergy 10 Mg  Tabs (Cetirizine Hcl) .Marland Kitchen.. 1 By Mouth Once Daily 7)  Vitamin D 1000 Unit  Tabs (Cholecalciferol) .Marland Kitchen.. 1 By Mouth Once Daily 8)  Tums 500 Mg  Chew (Calcium Carbonate Antacid) .... 2 By Mouth Once Daily 9)  Adult Gummy Calcium .... 500mg  Calcium, 1000 Vit D. 1 By Mouth Once Daily  Allergies (verified): 1)  ! * Boniva 2)  ! Fosamax (Alendronate Sodium)  Past History:  Past Medical History: Last updated: 05/21/2010 Diverticulosis  GERD with stricture Allergic rhinitis Hyperlipidemia:  [place] Study LDL goal = < 130. NMR 2005: LDL 981(1914/782), HDL 50, TG 154. LDL goal = < 140. Hypertension Hypothyroidism Vitiligo ; Osteopenia: T score -2.1 @ femoral neck 2011 ; Vitamin D deficiency  Past Surgical History: Last updated: 05/21/2010 Rotator cuff repair R shoulder Hysterectomy & USO for dysfunctional menses & scar tissue  breast biopsy X 2 ; Esophageal dilation X2 ; Colonoscopy X2 : diverticulosis , Dr Russella Dar, due 2012  Family History: Last updated: 05/21/2010 M breast & stomach cancer; s: breast cancer ;M aunt: colon cancer; F: COPD;cousin: DM; M aunt : cancer ? primary, benign cns tumor  Social History: Last updated: 05/21/2010 Occupation:[employer] , part time Patient has never smoked.  Alcohol Use :yes- wine rarely Daily Caffeine Use -1 cup or < Patient gets regular exercise: as walking, biking>30 min , 3-5  X/week  Widowed ;2 sons  Review of Systems       The pertinent positives and negatives are noted as above and in the HPI. All other ROS were reviewed and were negative.   Vital Signs:  Patient profile:   65 year old female Height:      63.25 inches Weight:      145 pounds BMI:     25.58 BSA:     1.69 Pulse rate:   80 / minute Pulse rhythm:   regular BP sitting:   140 / 84  (left arm)  Vitals Entered By: Merri Ray CMA Duncan Dull) (August 01, 2010 9:49 AM)  Physical Exam  General:  Well developed, well  nourished, no acute distress. Head:  Normocephalic and atraumatic. Eyes:  PERRLA, no icterus. Mouth:  No deformity or lesions, dentition normal. Lungs:  Clear throughout to auscultation. Heart:  Regular rate and rhythm; no murmurs, rubs,  or bruits. Abdomen:  Soft, nontender and nondistended. No masses, hepatosplenomegaly or hernias noted. Normal bowel sounds. Psych:  Alert and cooperative. Normal mood and affect.  Impression & Recommendations:  Problem # 1:  GERD (ICD-530.81) GERD with a prior history of peptic stricture.  Endoscopy with dilation performed last year. Continue standard antireflux measures and omeprazole 40 mg q.a.m. for long-term management.  Problem # 2:  Family Hx of COLON NEOPLASM, MALIGNANT (ICD-V16.0) Surveillance colonoscopy recommended March 2012.  Problem # 3:  DIVERTICULOSIS, COLON (ICD-562.10) Long-term high fiber diet with adequate daily water intake.  Patient Instructions: 1)  Omeprazole has been sent to your pharmacy for 90 day supply. 2)  Please schedule a follow-up appointment in 1 year. 3)  Copy sent to : Marga Melnick, MD 4)  The medication list was reviewed and reconciled.  All changed / newly prescribed medications were explained.  A complete medication list was provided to the patient / caregiver.  Prescriptions: OMEPRAZOLE 40 MG CPDR (OMEPRAZOLE) one tablet by mouth once daily  #90 x 3   Entered by:   Christie Nottingham CMA (AAMA)   Authorized by:   Meryl Dare MD [facility]   Signed by:   Christie Nottingham CMA (AAMA) on 08/01/2010   Method used:   Electronically to        [facility]* (retail)       [address]       [place], [place]  [address]       Ph: [phone]       Fax: [phone]   RxID:   [id number]

## 2010-09-14 ENCOUNTER — Other Ambulatory Visit: Payer: Self-pay | Admitting: Internal Medicine

## 2010-09-14 ENCOUNTER — Other Ambulatory Visit (INDEPENDENT_AMBULATORY_CARE_PROVIDER_SITE_OTHER): Payer: Medicare Other

## 2010-09-14 ENCOUNTER — Ambulatory Visit: Admit: 2010-09-14 | Payer: Self-pay | Admitting: Internal Medicine

## 2010-09-14 ENCOUNTER — Encounter (INDEPENDENT_AMBULATORY_CARE_PROVIDER_SITE_OTHER): Payer: Self-pay | Admitting: *Deleted

## 2010-09-14 DIAGNOSIS — E039 Hypothyroidism, unspecified: Secondary | ICD-10-CM

## 2010-09-14 DIAGNOSIS — E785 Hyperlipidemia, unspecified: Secondary | ICD-10-CM

## 2010-09-14 LAB — TSH: TSH: 4.27 u[IU]/mL (ref 0.35–5.50)

## 2010-09-14 LAB — LIPID PANEL
Total CHOL/HDL Ratio: 6
Triglycerides: 129 mg/dL (ref 0.0–149.0)

## 2010-09-21 ENCOUNTER — Ambulatory Visit (INDEPENDENT_AMBULATORY_CARE_PROVIDER_SITE_OTHER): Payer: Medicare Other | Admitting: Internal Medicine

## 2010-09-21 ENCOUNTER — Encounter: Payer: Self-pay | Admitting: Internal Medicine

## 2010-09-21 DIAGNOSIS — M949 Disorder of cartilage, unspecified: Secondary | ICD-10-CM

## 2010-09-21 DIAGNOSIS — E039 Hypothyroidism, unspecified: Secondary | ICD-10-CM

## 2010-09-21 DIAGNOSIS — E785 Hyperlipidemia, unspecified: Secondary | ICD-10-CM

## 2010-09-27 NOTE — Assessment & Plan Note (Signed)
Summary: FOLLOW UP OFFICE VISIT/DISCUSS LABS///[facility]   Vital Signs:  Patient profile:   65 year old female Weight:      147.8 pounds BMI:     26.07 Pulse rate:   72 / minute Resp:     14 per minute BP sitting:   140 / 78  (left arm) Cuff size:   large  Vitals Entered By: Shonna Chock CMA (September 21, 2010 9:17 AM) CC: Follow-up visit: discuss labs (copy given). Off Evista x 3 weeks (discuss)   Primary Care Provider:  Marga Melnick, MD  CC:  Follow-up visit: discuss labs (copy given). Off Evista x 3 weeks (discuss).  History of Present Illness:    Labs reviewed & risks discussed; LDL 182.4 off Crestor 20 mg 1/2  daily.  LDL goal = < 140. The patient denies the following symptoms: chest pain/pressure, exercise intolerance, dypsnea, palpitations, syncope, and pedal edema.  Dietary compliance has been good.  The patient reports exercising 3 X per week.    Allergies: 1)  ! * Boniva 2)  ! Fosamax (Alendronate Sodium)   Impression & Recommendations:  Problem # 1:  DYSLIPIDEMIA (ICD-272.4)  Her updated medication list for this problem includes:    Pravastatin Sodium 20 Mg Tabs (Pravastatin sodium) .Marland Kitchen... 1 at bedtime  Orders: Prescription Created Electronically [phone])  Problem # 2:  OSTEOPENIA (ICD-733.90)  Her updated medication list for this problem includes:    Evista 60 Mg Tabs (Raloxifene hcl) .Marland Kitchen... 1 by mouth once daily (taken > 10 tears)  Problem # 3:  HYPOTHYROIDISM (ICD-244.9) TSH is therapeutic Her updated medication list for this problem includes:    Levothroid 50 Mcg Tabs (Levothyroxine sodium) .Marland Kitchen... 1 by mouth once daily except 1 & 1/2 mon, weds & fri  Complete Medication List: 1)  Omeprazole 40 Mg Cpdr (Omeprazole) .... One tablet by mouth once daily 2)  Evista 60 Mg Tabs (Raloxifene hcl) .Marland Kitchen.. 1 by mouth qd 3)  Multivitamin With Iron  4)  Hydrochlorothiazide 25 Mg Tabs (Hydrochlorothiazide) .... 1/2 tab once daily prn 5)  Levothroid 50 Mcg Tabs  (Levothyroxine sodium) .Marland Kitchen.. 1 by mouth once daily except 1 & 1/2 mon, weds & fri 6)  Zyrtec Allergy 10 Mg Tabs (Cetirizine hcl) .Marland Kitchen.. 1 by mouth once daily 7)  Vitamin D 1000 Unit Tabs (Cholecalciferol) .Marland Kitchen.. 1 by mouth once daily 8)  Tums 500 Mg Chew (Calcium carbonate antacid) .... 2 by mouth once daily 9)  Adult Gummy Calcium  .... 500mg  calcium, 1000 vit d. 1 by mouth once daily 10)  Vitamin C 500 Mg Tabs (Ascorbic acid) .Marland Kitchen.. 1 by mouth once daily 11)  Pravastatin Sodium 20 Mg Tabs (Pravastatin sodium) .Marland Kitchen.. 1 at bedtime  Patient Instructions: 1)   Stop Evista; BMD every 25 months.Please schedule a follow-up appointment in 3 months. 2)  Hepatic Panel prior to visit, ICD-9:995.20 3)  Lipid Panel prior to visit, ICD-9:272.4 4)  TSH prior to visit, ICD-9:99244.9 Prescriptions: PRAVASTATIN SODIUM 20 MG TABS (PRAVASTATIN SODIUM) 1 at bedtime  #90 x 0   Entered and Authorized by:   Marga Melnick MD   Signed by:   Marga Melnick MD on 09/21/2010   Method used:   Electronically to        [facility]* (retail)       [address]       [place], [place]  [address]       Ph: [phone]       Fax: [phone]   RxID:  [id number]    Orders Added: 1)  Est. Patient Level III [[address]] 2)  Prescription Created Electronically [phone]  Appended Document: FOLLOW UP OFFICE VISIT/DISCUSS LABS///SPH S4 with slurring; all pulses intact; no carotid  bruits . No edema.

## 2010-10-01 ENCOUNTER — Telehealth: Payer: Self-pay | Admitting: Internal Medicine

## 2010-10-09 NOTE — Progress Notes (Signed)
Summary: Thyroid med rx misplaced by pharmacy  Phone Note Refill Request Message from:  Pharmacy on October 01, 2010 10:05 AM  Refills Requested: Medication #1:  LEVOTHROID 50 MCG TABS 1 by mouth once daily except 1 & 1/2 Mon Pharmacy notes that the patient brought them the script and told them to place it on hold. They have since misplaced the prescription and I gave it via phone.  Initial call taken by: Lucious Groves CMA,  October 01, 2010 10:05 AM    Prescriptions: LEVOTHROID 50 MCG TABS (LEVOTHYROXINE SODIUM) 1 by mouth once daily except 1 & 1/2 Mon, [place] & Fri  #102 x 0   Entered by:   Lucious Groves CMA   Authorized by:   Marga Melnick MD   Signed by:   Lucious Groves CMA on 10/01/2010   Method used:   Telephoned to ...       [facility]* (retail)       [address]       [place], [place]  [address]       Ph: [phone]       Fax: [phone]   RxID:   [id number]

## 2010-11-01 ENCOUNTER — Encounter: Payer: Self-pay | Admitting: Gastroenterology

## 2010-11-05 ENCOUNTER — Other Ambulatory Visit: Payer: Self-pay | Admitting: Dermatology

## 2010-11-08 NOTE — Letter (Signed)
Summary: Colonoscopy Date Change Letter  [facility]  [address] [place], [place] [address]   Phone: [phone]  Fax: [phone]      November 01, 2010 MRN: [id number]   Crystal Kidd [address] [place], [place]  [address]   Dear Ms. Lemma,   Previously you were recommended to have a repeat colonoscopy around this time. Your chart was recently reviewed by Dr. Claudette Head of [facility]. Follow up colonoscopy is now recommended in March 2017. This revised recommendation is based on current, nationally recognized guidelines for colorectal cancer screening and polyp surveillance. These guidelines are endorsed by the American Cancer Society, [employer] on Colorectal Cancer as well as numerous other major medical organizations.  Please understand that our recommendation assumes that you do not have any new symptoms such as bleeding, a change in bowel habits, anemia, or significant abdominal discomfort. If you do have any concerning GI symptoms or want to discuss the guideline recommendations, please call to arrange an office visit at your earliest convenience. Otherwise we will keep you in our reminder system and contact you 1-2 months prior to the date listed above to schedule your next colonoscopy.  Thank you,  Judie Petit T. Russella Dar, M.D.  [facility] [phone]

## 2010-12-18 ENCOUNTER — Other Ambulatory Visit: Payer: Self-pay

## 2010-12-18 MED ORDER — PRAVASTATIN SODIUM 20 MG PO TABS: 20.0000 mg | ORAL_TABLET | Freq: Every day | ORAL | Status: DC

## 2010-12-18 MED ORDER — LEVOTHYROXINE SODIUM 50 MCG PO TABS: 50.0000 ug | ORAL_TABLET | ORAL | Status: DC

## 2010-12-18 NOTE — Telephone Encounter (Signed)
Patient Instructions: 1)   Stop Evista; BMD every 25 months.Please schedule a follow-up appointment in 3 months. 2)  Hepatic Panel prior to visit, ICD-9:995.20 3)  Lipid Panel prior to visit, ICD-9:272.4 4)  TSH prior to visit, ICD-9:99244.9  Copied from 09/21/2010 OV patient instruction, labs due May 2012

## 2010-12-25 NOTE — Assessment & Plan Note (Signed)
[facility]                         GASTROENTEROLOGY OFFICE NOTE   Crystal, Pokorny MILINA Kidd                     MRN:          [id number]  DATE:01/19/2007                            DOB:          03-01-1946    This is a return office visit for GERD complicated by a peptic  stricture.  Her reflux symptoms have been under excellent control and  she has had no dysphagia, odynophagia, abdominal pain or  gastrointestinal bleeding.  Her last upper endoscopy was performed in  February 2005, with a dilation performed at that time. She has a family  history of colon cancer and her last colonoscopy was performed in March  2007, showing only diverticulosis.   CURRENT MEDICATIONS:  Listed on the chart, updated and reviewed.   ALLERGIES:  CODEINE leading to gastrointestinal upset.   PHYSICAL EXAMINATION:  GENERAL APPEARANCE:  No acute distress.  VITAL SIGNS:  Weight 148.6 pounds, blood pressure 122/70, pulse 76 and  regular.  CHEST:  Clear to auscultation bilaterally.  CARDIOVASCULAR:  Regular rate and rhythm without murmurs.  ABDOMEN:  Soft and nontender with normal active bowel sounds.   ASSESSMENT/PLAN:  1. Gastroesophageal reflux disease complicated by peptic stricture.      Maintain standard antireflux measures and omeprazole 20 mg p.o.      q.a.m.  Refills for one year supplied.  Return office visit one      year.  2. Diverticulosis.  Maintain a high fiber diet with adequate fluid      intake.  3. Family history of colon cancer.  Recall colonoscopy in March 2012.     Venita Lick. Russella Dar, MD, [facility]  Electronically Signed    MTS/MedQ  DD: 01/19/2007  DT: 01/19/2007  Job #: [id number]

## 2011-01-22 ENCOUNTER — Other Ambulatory Visit: Payer: Self-pay | Admitting: Internal Medicine

## 2011-01-22 DIAGNOSIS — E785 Hyperlipidemia, unspecified: Secondary | ICD-10-CM

## 2011-01-22 DIAGNOSIS — E039 Hypothyroidism, unspecified: Secondary | ICD-10-CM

## 2011-01-22 DIAGNOSIS — T887XXA Unspecified adverse effect of drug or medicament, initial encounter: Secondary | ICD-10-CM

## 2011-01-23 ENCOUNTER — Other Ambulatory Visit (INDEPENDENT_AMBULATORY_CARE_PROVIDER_SITE_OTHER): Payer: Medicare Other

## 2011-01-23 DIAGNOSIS — E039 Hypothyroidism, unspecified: Secondary | ICD-10-CM

## 2011-01-23 DIAGNOSIS — E785 Hyperlipidemia, unspecified: Secondary | ICD-10-CM

## 2011-01-23 DIAGNOSIS — T887XXA Unspecified adverse effect of drug or medicament, initial encounter: Secondary | ICD-10-CM

## 2011-01-23 LAB — HEPATIC FUNCTION PANEL
Alkaline Phosphatase: 82 U/L (ref 39–117)
Bilirubin, Direct: 0.1 mg/dL (ref 0.0–0.3)
Total Protein: 6.8 g/dL (ref 6.0–8.3)

## 2011-01-23 LAB — LIPID PANEL
HDL: 44.2 mg/dL (ref >39.00)
Total CHOL/HDL Ratio: 4
VLDL: 29.4 mg/dL (ref 0.0–40.0)

## 2011-01-23 LAB — TSH: TSH: 3.51 u[IU]/mL (ref 0.35–5.50)

## 2011-01-23 NOTE — Progress Notes (Signed)
Labs only

## 2011-01-26 ENCOUNTER — Encounter: Payer: Self-pay | Admitting: Internal Medicine

## 2011-01-30 ENCOUNTER — Encounter: Payer: Self-pay | Admitting: Internal Medicine

## 2011-01-30 ENCOUNTER — Ambulatory Visit (INDEPENDENT_AMBULATORY_CARE_PROVIDER_SITE_OTHER): Payer: Medicare Other | Admitting: Internal Medicine

## 2011-01-30 DIAGNOSIS — E039 Hypothyroidism, unspecified: Secondary | ICD-10-CM

## 2011-01-30 DIAGNOSIS — M949 Disorder of cartilage, unspecified: Secondary | ICD-10-CM

## 2011-01-30 DIAGNOSIS — E785 Hyperlipidemia, unspecified: Secondary | ICD-10-CM

## 2011-01-30 DIAGNOSIS — E559 Vitamin D deficiency, unspecified: Secondary | ICD-10-CM

## 2011-01-30 DIAGNOSIS — M899 Disorder of bone, unspecified: Secondary | ICD-10-CM

## 2011-01-30 DIAGNOSIS — I10 Essential (primary) hypertension: Secondary | ICD-10-CM

## 2011-01-30 MED ORDER — HYDROCHLOROTHIAZIDE 25 MG PO TABS: 25.0000 mg | ORAL_TABLET | Freq: Every day | ORAL | Status: DC

## 2011-01-30 MED ORDER — SIMVASTATIN 20 MG PO TABS: 20.0000 mg | ORAL_TABLET | Freq: Every evening | ORAL | Status: DC

## 2011-01-30 MED ORDER — LEVOTHYROXINE SODIUM 50 MCG PO TABS: 50.0000 ug | ORAL_TABLET | ORAL | Status: DC

## 2011-01-30 NOTE — Progress Notes (Signed)
  Subjective:    Patient ID: Crystal Kidd, female    DOB: June 25, 1946, 65 y.o.   MRN: [id number]  HPI  #1 CHRONIC HYPERTENSION Disease Monitoring  Blood pressure range: 117-157/57-76  Chest pain: no   Dyspnea: no   Claudication: no  Medication compliance: yes  Medication Side Effects  Lightheadedness: no   Urinary frequency: yes, nocturia x1   Edema: no    Preventitive Healthcare:  Exercise: yes, 45 walking 4X/week   Diet Pattern: no specific  Salt Restriction: minimal intake  #2 Dyslipidemia assessment: Prior Advanced Lipid Testing: NMR Lipoprofile 2005: LDL 141 (2338/1765), HDL 38, TG 175..   Family history of premature CAD/ MI: none .  Diabetes : no . Smoking history  : never .   #3 Hypothyroidism:  Weight :  Down 6 #. ROS: fatigue: no ;  palpitations: no; abd pain/bowel changes: no ; myalgias:no;  syncope : no ; memory loss:no skin changes: no; Temp intolerance: to cold. Lab results reviewed :TSH therapeutic.     Review of Systems     Objective:   Physical Exam Gen.: Healthy and well-nourished in appearance. Alert, appropriate and cooperative throughout exam. Eyes: No corneal or conjunctival inflammation noted.  Extraocular motion intact.no lid lag Neck: No deformities, masses, or tenderness noted. Range of motion normal. Thyroid firm & ULN insize. Lungs: Normal respiratory effort; chest expands symmetrically. Lungs are clear to auscultation without rales, wheezes, or increased work of breathing. Heart: Normal rate and rhythm. Normal S1 and S2. No gallop, click, or rub. S4 w/o  murmur. Abdomen: Bowel sounds normal; abdomen soft and nontender. No masses, organomegaly or hernias noted. No clubbing, cyanosis, or edema noted. Tone & strength  normal.Joints  With DIP OA changes. Nail health  good. Vascular: Carotid, radial artery, and  posterior tibial pulses are full and equal. No bruits present. No AAA . Decreased DPP Neurologic: Alert and oriented x3. Deep tendon reflexes  symmetrical and normal. No tremor       Skin: Intact without suspicious lesions or rashes. Lymph: No cervical or axillary  lymphadenopathy present. Psych: Mood and affect are normal. Normally interactive                                                                                         Assessment & Plan:  See problem List & Assessments Plan: change statin , check vit D level

## 2011-01-30 NOTE — Patient Instructions (Signed)
The most common cause of elevated triglycerides is the ingestion of sugar from high fructose corn syrup sources. You should consume less than 40 grams  of sugar per day from foods and drinks with high fructose corn syrup as number 2, 3, or #4 on the label. As TG go up, HDL or good cholesterol goes down. Also uric acid which causes gout will go up. Please  schedule fasting Labs in 10 weeks : BMET,Lipids, hepatic panel,  Vitamin D level; see Diagnoses for Codes.

## 2011-04-09 ENCOUNTER — Other Ambulatory Visit: Payer: Self-pay | Admitting: Internal Medicine

## 2011-04-09 DIAGNOSIS — E039 Hypothyroidism, unspecified: Secondary | ICD-10-CM

## 2011-04-09 DIAGNOSIS — M949 Disorder of cartilage, unspecified: Secondary | ICD-10-CM

## 2011-04-09 DIAGNOSIS — E785 Hyperlipidemia, unspecified: Secondary | ICD-10-CM

## 2011-04-09 DIAGNOSIS — I10 Essential (primary) hypertension: Secondary | ICD-10-CM

## 2011-04-09 DIAGNOSIS — E559 Vitamin D deficiency, unspecified: Secondary | ICD-10-CM

## 2011-04-10 ENCOUNTER — Other Ambulatory Visit (INDEPENDENT_AMBULATORY_CARE_PROVIDER_SITE_OTHER): Payer: Medicare Other

## 2011-04-10 DIAGNOSIS — E039 Hypothyroidism, unspecified: Secondary | ICD-10-CM

## 2011-04-10 DIAGNOSIS — M949 Disorder of cartilage, unspecified: Secondary | ICD-10-CM

## 2011-04-10 DIAGNOSIS — I10 Essential (primary) hypertension: Secondary | ICD-10-CM

## 2011-04-10 DIAGNOSIS — M899 Disorder of bone, unspecified: Secondary | ICD-10-CM

## 2011-04-10 DIAGNOSIS — E785 Hyperlipidemia, unspecified: Secondary | ICD-10-CM

## 2011-04-10 DIAGNOSIS — E559 Vitamin D deficiency, unspecified: Secondary | ICD-10-CM

## 2011-04-10 LAB — HEPATIC FUNCTION PANEL
ALT: 20 U/L (ref 0–35)
Bilirubin, Direct: 0 mg/dL (ref 0.0–0.3)
Total Bilirubin: 0.5 mg/dL (ref 0.3–1.2)

## 2011-04-10 LAB — LIPID PANEL
HDL: 49 mg/dL (ref >39.00)
Total CHOL/HDL Ratio: 3
VLDL: 21.2 mg/dL (ref 0.0–40.0)

## 2011-04-10 LAB — BASIC METABOLIC PANEL
BUN: 12 mg/dL (ref 6–23)
CO2: 32 mEq/L (ref 19–32)
GFR: 89.03 mL/min (ref >60.00)
Glucose, Bld: 93 mg/dL (ref 70–99)
Potassium: 4 mEq/L (ref 3.5–5.1)
Sodium: 141 mEq/L (ref 135–145)

## 2011-04-10 LAB — VITAMIN D 25 HYDROXY (VIT D DEFICIENCY, FRACTURES): Vit D, 25-Hydroxy: 44 ng/mL (ref 30–89)

## 2011-04-10 NOTE — Progress Notes (Signed)
Labs only

## 2011-04-13 ENCOUNTER — Ambulatory Visit: Payer: Self-pay | Admitting: Oncology

## 2011-04-27 ENCOUNTER — Emergency Department: Payer: Self-pay | Admitting: Emergency Medicine

## 2011-04-30 ENCOUNTER — Encounter: Payer: Self-pay | Admitting: Internal Medicine

## 2011-04-30 ENCOUNTER — Ambulatory Visit (INDEPENDENT_AMBULATORY_CARE_PROVIDER_SITE_OTHER): Payer: Medicare Other | Admitting: Internal Medicine

## 2011-04-30 DIAGNOSIS — M542 Cervicalgia: Secondary | ICD-10-CM

## 2011-04-30 DIAGNOSIS — R937 Abnormal findings on diagnostic imaging of other parts of musculoskeletal system: Secondary | ICD-10-CM

## 2011-04-30 DIAGNOSIS — I10 Essential (primary) hypertension: Secondary | ICD-10-CM

## 2011-04-30 MED ORDER — LOSARTAN POTASSIUM 50 MG PO TABS: 50.0000 mg | ORAL_TABLET | Freq: Every day | ORAL | Status: DC

## 2011-04-30 MED ORDER — DICLOFENAC SODIUM 75 MG PO TBEC: 75.0000 mg | DELAYED_RELEASE_TABLET | Freq: Two times a day (BID) | ORAL | Status: DC

## 2011-04-30 NOTE — Progress Notes (Signed)
Subjective:    Patient ID: Crystal Kidd, female    DOB: 05/30/1946, 65 y.o.   MRN: [id number]  HPI  #1 Neck Pain : Location: L base of skull   Onset: 9/13 eve   Severity: up to 10 Pain is described as: sharp - burning  Worse with: L  lateral rotation    Better with: Aleve; narcotic in ER but it caused itching  Pain radiates to: to base of neck & L shoulder   Impaired range of motion: no  History of repetitive motion:  no  History of overuse or hyperextension:  no  History of trauma:  no   Past history of similar problem:  yes, only lasted 1 day , 1 yr ago   Symptoms Back Pain:  no  Numbness/tingling:  no  Weakness:  no  Red Flags Fever:  yes, 99.6 high  Headache:  no  Bowel/bladder dysfunction:  No  CAT scan : "holes or lesions " in upper spine; "brain OK". Referral made to Dr Orlie Dakin , Oncologist    #2 HYPERTENSION: Disease Monitoring Blood pressure range-acute exacerbation to 213/100+ with pain & again today  Chest pain- no       Dyspnea- earlier today, resolved Medications Compliance- HCTZ 25 mg   Lightheadedness- yes    Edema- no       Review of Systems     Objective:   Physical Exam Gen.: Healthy and well-nourished in appearance. Appears tired but appropriate and cooperative throughout exam but uncomfortable. Head: Normocephalic without obvious abnormalities Eyes: No corneal or conjunctival inflammation noted. Pupils equal round reactive to light and accommodation.  Extraocular motion intact.FOV &  Vision grossly normal. Ears: External  ear exam reveals no significant lesions or deformities. Canals clear .TMs normal. Hearing is grossly normal bilaterally. Nose: External nasal exam reveals no deformity or inflammation. Nasal mucosa are pink and moist. No lesions or exudates noted. Septum dislocation Mouth: Oral mucosa and oropharynx reveal no lesions or exudates. Teeth in good repair.Tongue not deviated.Upper partial Neck: No deformities,  masses.Tenderness noted over base of spine. Range of motion normal; supple. . Thyroid normal.? Cervical rib on L Lungs: Normal respiratory effort; chest expands symmetrically. Lungs are clear to auscultation without rales, wheezes, or increased work of breathing. Heart: Normal rate and rhythm. Normal S1 and S2. No gallop, click, or rub. S4 w/o murmur.                                                                                    Musculoskeletal/extremities: No deformity or scoliosis noted of  the thoracic or lumbar spine. No clubbing, cyanosis, edema, or deformity noted. Range of motion  normal .Tone & strength  Normal.Joints: scattered DIP OA changes. Nail health  good. Vascular: Carotid, radial artery, dorsalis pedis and  posterior tibial pulses are full and equal. No bruits present. Neurologic: Alert and oriented x3. Deep tendon reflexes symmetrical and normal.          Skin: Intact without suspicious lesions or rashes.Scattered vitiligo Lymph: No cervical, axillary lymphadenopathy present. Psych: Mood and affect are normal. Normally interactive  Assessment & Plan:  #1 neck pain #2 abnormal CT of spine  #3 HTN exacerbated by pain Plan : see Orders

## 2011-04-30 NOTE — Patient Instructions (Signed)
.  Share results with all MDs seen  

## 2011-05-01 ENCOUNTER — Telehealth: Payer: Self-pay | Admitting: Internal Medicine

## 2011-05-01 LAB — CBC WITH DIFFERENTIAL/PLATELET
Basophils Relative: 1.8 % (ref 0.0–3.0)
Eosinophils Absolute: 0.1 10*3/uL (ref 0.0–0.7)
Hemoglobin: 15 g/dL (ref 12.0–15.0)
Lymphocytes Relative: 54.2 % — ABNORMAL HIGH (ref 12.0–46.0)
MCHC: 33.9 g/dL (ref 30.0–36.0)
MCV: 88.6 fl (ref 78.0–100.0)
Monocytes Absolute: 0.4 10*3/uL (ref 0.1–1.0)
Neutro Abs: 0.4 10*3/uL — ABNORMAL LOW (ref 1.4–7.7)
RBC: 5.01 Mil/uL (ref 3.87–5.11)

## 2011-05-01 NOTE — Telephone Encounter (Signed)
Pt would like to know if Dr hopper was able to get scan done at [facility]. Pt was seen on yesterday.

## 2011-05-01 NOTE — Telephone Encounter (Signed)
Spoke with patient, Patient would like to know what Dr.Hopper thought about reports from [facility] (reports were placed on the ledge for review this morning), patient would like to know if she should see cancer Dr on Monday? If yes would Dr.Hopper prefer that she see one in [place]. (currently with pending appointment with Dr.Finnergan in [place])   (cell: [phone])   Dr.Hopper please advise (patient informed out of office until tomorrow)

## 2011-05-02 ENCOUNTER — Ambulatory Visit: Payer: Medicare Other | Admitting: Internal Medicine

## 2011-05-03 NOTE — Telephone Encounter (Signed)
Please see CBC &dif. The low white count and types of cells suggests a possible recent viral infection. Please avoid contact with anyone who is ill. I reviewed the results from the emergency room. She should see the specialist on Monday. The special blood test & urine test we ordered are pending.

## 2011-05-03 NOTE — Telephone Encounter (Signed)
Pt is calling again to see if anything has been decided on from 9/19 phone call.Please call pt back as soon as possible.

## 2011-05-03 NOTE — Telephone Encounter (Signed)
Discuss with patient  

## 2011-05-03 NOTE — Telephone Encounter (Signed)
Left message to call office

## 2011-05-06 ENCOUNTER — Ambulatory Visit: Payer: Self-pay | Admitting: Oncology

## 2011-05-07 LAB — CANCER ANTIGEN 19-9: CA 19-9: <1 U/mL (ref 0–35)

## 2011-05-07 LAB — CANCER ANTIGEN 27.29: CA 27.29: 29.3 U/mL (ref 0.0–38.6)

## 2011-05-08 LAB — KAPPA/LAMBDA FREE LIGHT CHAINS (ARMC)

## 2011-05-08 LAB — PROT IMMUNOELECTROPHORES(ARMC)

## 2011-05-13 ENCOUNTER — Ambulatory Visit: Payer: Self-pay | Admitting: Oncology

## 2011-05-22 ENCOUNTER — Ambulatory Visit (INDEPENDENT_AMBULATORY_CARE_PROVIDER_SITE_OTHER): Payer: Medicare Other

## 2011-05-22 DIAGNOSIS — Z23 Encounter for immunization: Secondary | ICD-10-CM

## 2011-05-31 ENCOUNTER — Encounter: Payer: Self-pay | Admitting: Internal Medicine

## 2011-06-13 ENCOUNTER — Ambulatory Visit: Payer: Self-pay | Admitting: Oncology

## 2011-07-29 ENCOUNTER — Telehealth: Payer: Self-pay | Admitting: Internal Medicine

## 2011-07-29 NOTE — Telephone Encounter (Signed)
Patient was  Seen several months ago for shoulder/neck pain she was given pill - she is out of town & has same pain tylenol isn't helping wants a few of the same pills   ---  [employer] Kizzie Bane - [phone]

## 2011-07-29 NOTE — Telephone Encounter (Signed)
Dr.Hopper please advise ( I reviewed med list, patient was given voltaren)

## 2011-07-30 NOTE — Telephone Encounter (Signed)
GenericDiclofenac 50 mg twice a day with food as needed; dispense 14

## 2011-07-30 NOTE — Telephone Encounter (Signed)
RX called in .

## 2011-07-30 NOTE — Telephone Encounter (Signed)
Patient called back requesting medication be sent to [facility] pharmacy in [place] [phone]

## 2011-08-05 ENCOUNTER — Encounter: Payer: Self-pay | Admitting: Internal Medicine

## 2011-08-08 ENCOUNTER — Other Ambulatory Visit: Payer: Self-pay | Admitting: Internal Medicine

## 2011-08-08 ENCOUNTER — Encounter: Payer: Self-pay | Admitting: Internal Medicine

## 2011-08-08 ENCOUNTER — Ambulatory Visit (INDEPENDENT_AMBULATORY_CARE_PROVIDER_SITE_OTHER): Payer: Medicare Other | Admitting: Internal Medicine

## 2011-08-08 DIAGNOSIS — M542 Cervicalgia: Secondary | ICD-10-CM

## 2011-08-08 DIAGNOSIS — R51 Headache: Secondary | ICD-10-CM

## 2011-08-08 DIAGNOSIS — H538 Other visual disturbances: Secondary | ICD-10-CM

## 2011-08-08 MED ORDER — GABAPENTIN 100 MG PO CAPS: 100.0000 mg | ORAL_CAPSULE | Freq: Three times a day (TID) | ORAL | Status: DC

## 2011-08-08 NOTE — Telephone Encounter (Signed)
RX already filled, called pharmacy and informed them if not on file to please fill

## 2011-08-08 NOTE — Progress Notes (Signed)
  Subjective:    Patient ID: Crystal Kidd, female    DOB: 1945-10-17, 65 y.o.   MRN: [id number]  HPI NECK PAIN: Location: L neck   Onset: 2 weeks ago   Severity: up to 10+ Pain is described as: sharp , burning  Worse with: no exacerbating factors    Better with: ice  Pain radiates to: L ear & occiput   Impaired range of motion: no  History of repetitive motion:  no  History of overuse or hyperextension:  no  History of trauma:  no   Past history of similar problem:  yes, in 9/12 . It resolved with pain meds   Symptoms Back Pain:  no  Numbness/tingling:  no  Weakness:  no  Red Flags Fever:  no  Headache:  yes, occasionally over crown  Bowel/bladder dysfunction:  No  Some blurring of vision with neck pain      Review of Systems The abnormal spine & skull lesions in 9/12  were evaluated by Oncologist in [place] ; initially Multiple Myeloma diagnosed but subsequently diagnosis was CT scan malfunction.     Objective:   Physical Exam  Gen. appearance: Well-nourished, in no distress. Eyes: Extraocular motion intact, field of vision normal, vision grossly intact with lenses, no nystagmus ENT: Canals clear, tympanic membranes normal, tuning fork exam normal, hearing grossly normal Neck: Normal range of motion, no masses, normal thyroid Cardiovascular: Rate and rhythm normal; no murmurs, gallops or extra heart sounds Muscle skeletal:  tone, &  strength normal; mild OA hand changes Neuro:no cranial nerve deficit, deep tendon  reflexes normal, gait normal Lymph: No cervical or axillary LA Skin: Warm and dry without suspicious lesions or rashes.Tender to touch L temple Psych: no anxiety or mood change. Normally interactive and cooperative.         Assessment & Plan:  #1 neck pain & headaches. R/O Temporal Arteritis, PMR,cervical neuralgia , vs atypical migraine (doubt) Plan: see labs & meds . Ophthalmology  assessment

## 2011-08-22 ENCOUNTER — Other Ambulatory Visit: Payer: Self-pay | Admitting: Internal Medicine

## 2011-08-22 ENCOUNTER — Encounter: Payer: Self-pay | Admitting: Internal Medicine

## 2011-08-22 ENCOUNTER — Ambulatory Visit (INDEPENDENT_AMBULATORY_CARE_PROVIDER_SITE_OTHER): Payer: Medicare Other | Admitting: Internal Medicine

## 2011-08-22 VITALS — BP 122/80 | HR 94 | Temp 98.5°F | Resp 12 | Ht 63.0 in | Wt 145.6 lb

## 2011-08-22 DIAGNOSIS — M542 Cervicalgia: Secondary | ICD-10-CM

## 2011-08-22 DIAGNOSIS — K5730 Diverticulosis of large intestine without perforation or abscess without bleeding: Secondary | ICD-10-CM

## 2011-08-22 DIAGNOSIS — R51 Headache: Secondary | ICD-10-CM

## 2011-08-22 DIAGNOSIS — R519 Headache, unspecified: Secondary | ICD-10-CM

## 2011-08-22 DIAGNOSIS — K219 Gastro-esophageal reflux disease without esophagitis: Secondary | ICD-10-CM

## 2011-08-22 DIAGNOSIS — Z0000 Encounter for general adult medical examination without abnormal findings: Secondary | ICD-10-CM

## 2011-08-22 LAB — CBC WITH DIFFERENTIAL/PLATELET
Basophils Absolute: 0.1 10*3/uL (ref 0.0–0.1)
Basophils Relative: 1.4 % (ref 0.0–3.0)
Eosinophils Absolute: 0.2 10*3/uL (ref 0.0–0.7)
Eosinophils Relative: 4.2 % (ref 0.0–5.0)
HCT: 41.8 % (ref 36.0–46.0)
Hemoglobin: 14.3 g/dL (ref 12.0–15.0)
Lymphocytes Relative: 21 % (ref 12.0–46.0)
Lymphs Abs: 1.1 10*3/uL (ref 0.7–4.0)
MCHC: 34.1 g/dL (ref 30.0–36.0)
MCV: 90.1 fl (ref 78.0–100.0)
Monocytes Absolute: 0.5 10*3/uL (ref 0.1–1.0)
Monocytes Relative: 9.5 % (ref 3.0–12.0)
Neutro Abs: 3.3 10*3/uL (ref 1.4–7.7)
Neutrophils Relative %: 63.9 % (ref 43.0–77.0)
Platelets: 294 10*3/uL (ref 150.0–400.0)
RBC: 4.64 Mil/uL (ref 3.87–5.11)
RDW: 13 % (ref 11.5–14.6)
WBC: 5.2 10*3/uL (ref 4.5–10.5)

## 2011-08-22 MED ORDER — GABAPENTIN 100 MG PO CAPS: 100.0000 mg | ORAL_CAPSULE | Freq: Three times a day (TID) | ORAL | Status: DC

## 2011-08-22 NOTE — Patient Instructions (Signed)

## 2011-08-22 NOTE — Progress Notes (Signed)
Subjective:    Patient ID: Crystal Kidd, female    DOB: 12/25/1945, 66 y.o.   MRN: [id number]  HPI Medicare Wellness Visit:  The following psychosocial & medical history were reviewed as required by Medicare.   Social history: caffeine: 2 cups coffee/ day , alcohol:  Rarely  ,  tobacco use : never  & exercise :walking 3X/ week < 30 min  Home & personal  safety / fall risk: no issues, activities of daily living:no limitations , seatbelt use : yes , and smoke alarm employment : yes .  Power of Attorney/Living Will status : in place  Vision ( as recorded per Nurse) & Hearing  evaluation :  Complete Ophth exam last week (see below).Slight decreased acuity L ear. Orientation :oriented X 3  , memory & recall :good ,  math testing: good,and mood & affect : normal . Depression / anxiety: denied Travel history : 85 [place] , immunization status :up to date , transfusion history:  Yes , post partum hemorrhage 1968, and preventive health surveillance ( colonoscopies, BMD , etc as per protocol/ SOC): to see GI this month, Dental care: every 6 mos . Chart reviewed &  Updated. Active issues reviewed & addressed.       Review of Systems   The ophthalmologist found no significant findings on exam. C. reactive protein and CBC and differential were recommended by specialist .  Gabapentin resulted in dramatic improvement in the headaches. Intermittently she'll have a sharp pain in the left occipital area.       Objective:   Physical Exam Gen.: Healthy and well-nourished in appearance. Alert, appropriate and cooperative throughout exam. Head: Normocephalic without obvious abnormalities;  No temple tenderness Eyes: No corneal or conjunctival inflammation noted.  Extraocular motion intact.Slight ptosis OD. Ears: External  ear exam reveals no significant lesions or deformities. Canals clear .TMs normal.  Nose: External nasal exam reveals no deformity or inflammation. Nasal mucosa are pink and moist. No  lesions or exudates noted. Septum  :slight dislocation  Mouth: Oral mucosa and oropharynx reveal no lesions or exudates. Teeth in good repair; upper & lower partials. Neck: No deformities, masses, or tenderness noted. Range of motion normal. Thyroid : Physiologic asymmetry with right lobe greater than left. Thyroid is firm without nodularity. Lungs: Normal respiratory effort; chest expands symmetrically. Lungs are clear to auscultation without rales, wheezes, or increased work of breathing. Heart: Normal rate and rhythm. Normal S1 and S2. No gallop, click, or rub. Very faint, grade 1/2 systolic murmur. Abdomen: Bowel sounds normal; abdomen soft and nontender. No masses, organomegaly or hernias noted. Genitalia: Dr Vincente Poli   .                                                                                   Musculoskeletal/extremities: No deformity or scoliosis noted of  the thoracic or lumbar spine. No clubbing, cyanosis, edema, or deformity noted. Range of motion  normal .Tone & strength  normal.Joints :mild DIP changes. Nail health  good. Vascular: Carotid, radial artery, dorsalis pedis and  posterior tibial pulses are full and equal. No bruits present. Neurologic: Alert and oriented x3. Deep tendon reflexes symmetrical and normal.  Skin: Intact without suspicious lesions or rashes.Scattered vitiligo Lymph: No cervical, axillary lymphadenopathy present. Psych: Mood and affect are normal. Normally interactive                                                                                         Assessment & Plan:  #1 Medicare Wellness Exam; criteria met ; data entered #2 Problem List reviewed ; Assessment/ Recommendations made Plan: see Orders

## 2011-08-22 NOTE — Telephone Encounter (Signed)
Patient here in office now for CPX. Dr.Hopper will fill if med is to be continued

## 2011-08-22 NOTE — Assessment & Plan Note (Addendum)
She will be seeing her gastroenterologist in the near future.

## 2011-08-22 NOTE — Assessment & Plan Note (Signed)
She has no symptoms of dysphasia, melena, or rectal bleeding. Hematocrit was 44.4 on 04/30/11. CBC and differential will be rechecked.

## 2011-08-23 ENCOUNTER — Telehealth: Payer: Self-pay

## 2011-08-23 LAB — C-REACTIVE PROTEIN: CRP: 0.17 mg/dL (ref <0.60)

## 2011-08-23 MED ORDER — SIMVASTATIN 20 MG PO TABS: 20.0000 mg | ORAL_TABLET | Freq: Every evening | ORAL | Status: DC

## 2011-08-23 NOTE — Telephone Encounter (Signed)
Pt requesting rx for cholesterol.    Ok per Dr. Alwyn Ren to send rx.  Rx sent to pharmacy electronically.  Left a message for pt.

## 2011-08-26 ENCOUNTER — Other Ambulatory Visit: Payer: Self-pay | Admitting: Internal Medicine

## 2011-08-26 NOTE — Telephone Encounter (Signed)
Filled on 1 /06/2012, Duplicate request

## 2011-09-04 ENCOUNTER — Telehealth: Payer: Self-pay | Admitting: Gastroenterology

## 2011-09-04 MED ORDER — OMEPRAZOLE 40 MG PO CPDR: 40.0000 mg | DELAYED_RELEASE_CAPSULE | Freq: Every day | ORAL | Status: DC

## 2011-09-04 NOTE — Telephone Encounter (Signed)
Sent a prescription until she comes in for her appt in March and told her to keep her appt for any further refills. Pt agreed.

## 2011-09-05 ENCOUNTER — Ambulatory Visit: Payer: Medicare Other | Admitting: Gastroenterology

## 2011-10-16 ENCOUNTER — Ambulatory Visit: Payer: Medicare Other | Admitting: Gastroenterology

## 2011-10-28 ENCOUNTER — Encounter: Payer: Self-pay | Admitting: Gastroenterology

## 2011-10-28 ENCOUNTER — Ambulatory Visit (INDEPENDENT_AMBULATORY_CARE_PROVIDER_SITE_OTHER): Payer: Medicare Other | Admitting: Gastroenterology

## 2011-10-28 VITALS — BP 124/76 | HR 62 | Ht 63.0 in | Wt 149.0 lb

## 2011-10-28 DIAGNOSIS — K219 Gastro-esophageal reflux disease without esophagitis: Secondary | ICD-10-CM

## 2011-10-28 DIAGNOSIS — Z800 Family history of malignant neoplasm of digestive organs: Secondary | ICD-10-CM

## 2011-10-28 MED ORDER — OMEPRAZOLE 40 MG PO CPDR: 40.0000 mg | DELAYED_RELEASE_CAPSULE | Freq: Every day | ORAL | Status: DC

## 2011-10-28 NOTE — Progress Notes (Signed)
History of Present Illness: This is a 66 year old female with long-standing GERD and a history of esophageal stricture. Her reflux symptoms are under excellent control she only has rare episodes of breakthrough symptoms. She states she has annual Hemoccults done through her gynecologists office. Denies weight loss, abdominal pain, constipation, diarrhea, change in stool caliber, melena, hematochezia, nausea, vomiting, dysphagia, chest pain.  Current Medications, Allergies, Past Medical History, Past Surgical History, Family History and Social History were reviewed in [employer] record.  Physical Exam: General: Well developed , well nourished, no acute distress Head: Normocephalic and atraumatic Eyes:  sclerae anicteric, EOMI Ears: Normal auditory acuity Mouth: No deformity or lesions Lungs: Clear throughout to auscultation Heart: Regular rate and rhythm; no murmurs, rubs or bruits Abdomen: Soft, non tender and non distended. No masses, hepatosplenomegaly or hernias noted. Normal Bowel sounds Musculoskeletal: Symmetrical with no gross deformities  Pulses:  Normal pulses noted Extremities: No clubbing, cyanosis, edema or deformities noted Neurological: Alert oriented x 4, grossly nonfocal Psychological:  Alert and cooperative. Normal mood and affect  Assessment and Recommendations:  1. GERD with a history of an esophageal stricture. Continue standard antireflux measures and omeprazole 40 mg daily. Continue bone density studies through primary care office  2. Family history of colon cancer in a maternal aunt. We discussed the recommended guidelines for every 10 year colonoscopies for second degree relatives with colon cancer. Since is more than 5 years since her last colonoscopy I would continue with annual Hemoccults by her gynecologist or PCP.  3. Diverticulosis. Long-term high-fiber diet with adequate daily water intake.

## 2011-11-07 ENCOUNTER — Other Ambulatory Visit: Payer: Self-pay | Admitting: Internal Medicine

## 2011-11-07 DIAGNOSIS — I10 Essential (primary) hypertension: Secondary | ICD-10-CM

## 2011-11-07 MED ORDER — LOSARTAN POTASSIUM 50 MG PO TABS: 50.0000 mg | ORAL_TABLET | Freq: Every day | ORAL | Status: DC

## 2011-11-07 NOTE — Telephone Encounter (Signed)
Refill for  Losarton Potassium 50MG  Tab #30 Qty 30 Last filled 2.21.13 Take 1-tablet by mouth every day

## 2011-11-07 NOTE — Telephone Encounter (Signed)
RX sent

## 2011-11-28 ENCOUNTER — Encounter: Payer: Self-pay | Admitting: Internal Medicine

## 2011-12-03 ENCOUNTER — Other Ambulatory Visit: Payer: Self-pay | Admitting: Internal Medicine

## 2011-12-03 MED ORDER — SIMVASTATIN 20 MG PO TABS: 20.0000 mg | ORAL_TABLET | Freq: Every evening | ORAL | Status: DC

## 2011-12-03 NOTE — Telephone Encounter (Signed)
refill Simvastatin 20MG  Tab #90 Qty 90 Take one tablet by mouth each evening Last filled 1.11.2013 Last OV 1.10.2013

## 2011-12-03 NOTE — Telephone Encounter (Signed)
RX sent

## 2012-02-10 ENCOUNTER — Telehealth: Payer: Self-pay | Admitting: Internal Medicine

## 2012-02-10 DIAGNOSIS — E039 Hypothyroidism, unspecified: Secondary | ICD-10-CM

## 2012-02-10 MED ORDER — LEVOTHYROXINE SODIUM 50 MCG PO TABS: 50.0000 ug | ORAL_TABLET | ORAL | Status: DC

## 2012-02-10 NOTE — Telephone Encounter (Signed)
Refill: Levothyroxine sodium tab #108. Take one tablet by mouth every day except 1 1/2 tabs on mon, wed, and fri. Last fill 11-18-11

## 2012-03-11 ENCOUNTER — Other Ambulatory Visit (INDEPENDENT_AMBULATORY_CARE_PROVIDER_SITE_OTHER): Payer: Medicare Other

## 2012-03-11 DIAGNOSIS — E039 Hypothyroidism, unspecified: Secondary | ICD-10-CM

## 2012-03-11 LAB — TSH: TSH: 2.84 u[IU]/mL (ref 0.35–5.50)

## 2012-03-16 ENCOUNTER — Other Ambulatory Visit: Payer: Self-pay | Admitting: Internal Medicine

## 2012-03-16 DIAGNOSIS — E039 Hypothyroidism, unspecified: Secondary | ICD-10-CM

## 2012-03-16 MED ORDER — LEVOTHYROXINE SODIUM 50 MCG PO TABS: 50.0000 ug | ORAL_TABLET | ORAL | Status: DC

## 2012-03-16 NOTE — Telephone Encounter (Signed)
Refill Levothyroxine Sodium (Tab) SYNTHROID, LEVOTHROID 50 MCG #36 Take 1 tablet (50 mcg total) by mouth as directed. 1 by mouth daily except 1 1/2 by mouth M,W,& F  Last fill 3.18.13 Last OV 3.13.13 Follow up labs done 7.31.13

## 2012-04-29 ENCOUNTER — Ambulatory Visit (INDEPENDENT_AMBULATORY_CARE_PROVIDER_SITE_OTHER): Payer: Medicare Other

## 2012-04-29 DIAGNOSIS — Z23 Encounter for immunization: Secondary | ICD-10-CM

## 2012-05-07 ENCOUNTER — Telehealth: Payer: Self-pay | Admitting: Internal Medicine

## 2012-05-07 DIAGNOSIS — I10 Essential (primary) hypertension: Secondary | ICD-10-CM

## 2012-05-07 MED ORDER — LOSARTAN POTASSIUM 50 MG PO TABS: 50.0000 mg | ORAL_TABLET | Freq: Every day | ORAL | Status: DC

## 2012-05-07 MED ORDER — LOSARTAN POTASSIUM 50 MG PO TABS
50.0000 mg | ORAL_TABLET | Freq: Every day | ORAL | Status: DC
Start: 2012-05-07 — End: 2012-05-07

## 2012-05-07 NOTE — Telephone Encounter (Signed)
RX sent

## 2012-05-07 NOTE — Telephone Encounter (Signed)
Refill: Losartan potassium 50mg  tab #30. Take one tablet by mouth every day. Last fill 04-14-12

## 2012-06-19 ENCOUNTER — Telehealth: Payer: Self-pay | Admitting: Internal Medicine

## 2012-06-19 MED ORDER — SIMVASTATIN 20 MG PO TABS: 20.0000 mg | ORAL_TABLET | Freq: Every evening | ORAL | Status: DC

## 2012-06-19 NOTE — Telephone Encounter (Signed)
Refill: Simvastatin 20 mg tab #90. Take one tablet by mouth each evening. Last fill 03-09-12

## 2012-06-19 NOTE — Telephone Encounter (Signed)
RX sent, patient with pending appointment 08/2012

## 2012-08-24 ENCOUNTER — Ambulatory Visit (INDEPENDENT_AMBULATORY_CARE_PROVIDER_SITE_OTHER): Payer: Medicare Other | Admitting: Internal Medicine

## 2012-08-24 ENCOUNTER — Encounter: Payer: Self-pay | Admitting: Internal Medicine

## 2012-08-24 VITALS — BP 124/72 | HR 89 | Temp 98.0°F | Resp 12 | Wt 145.2 lb

## 2012-08-24 DIAGNOSIS — I10 Essential (primary) hypertension: Secondary | ICD-10-CM

## 2012-08-24 DIAGNOSIS — R51 Headache: Secondary | ICD-10-CM

## 2012-08-24 DIAGNOSIS — Z0000 Encounter for general adult medical examination without abnormal findings: Secondary | ICD-10-CM

## 2012-08-24 DIAGNOSIS — E559 Vitamin D deficiency, unspecified: Secondary | ICD-10-CM

## 2012-08-24 DIAGNOSIS — M949 Disorder of cartilage, unspecified: Secondary | ICD-10-CM

## 2012-08-24 DIAGNOSIS — K219 Gastro-esophageal reflux disease without esophagitis: Secondary | ICD-10-CM

## 2012-08-24 DIAGNOSIS — R918 Other nonspecific abnormal finding of lung field: Secondary | ICD-10-CM | POA: Insufficient documentation

## 2012-08-24 DIAGNOSIS — E785 Hyperlipidemia, unspecified: Secondary | ICD-10-CM

## 2012-08-24 DIAGNOSIS — E039 Hypothyroidism, unspecified: Secondary | ICD-10-CM

## 2012-08-24 LAB — HEPATIC FUNCTION PANEL
AST: 19 U/L (ref 0–37)
Albumin: 3.9 g/dL (ref 3.5–5.2)
Alkaline Phosphatase: 85 U/L (ref 39–117)
Total Bilirubin: 0.5 mg/dL (ref 0.3–1.2)

## 2012-08-24 LAB — CBC WITH DIFFERENTIAL/PLATELET
Basophils Absolute: 0.1 10*3/uL (ref 0.0–0.1)
Basophils Relative: 1.3 % (ref 0.0–3.0)
Eosinophils Absolute: 0.2 10*3/uL (ref 0.0–0.7)
Eosinophils Relative: 4.9 % (ref 0.0–5.0)
HCT: 42 % (ref 36.0–46.0)
Hemoglobin: 14.1 g/dL (ref 12.0–15.0)
Lymphocytes Relative: 23.3 % (ref 12.0–46.0)
Lymphs Abs: 1.1 10*3/uL (ref 0.7–4.0)
MCHC: 33.6 g/dL (ref 30.0–36.0)
MCV: 87.2 fl (ref 78.0–100.0)
Monocytes Absolute: 0.5 10*3/uL (ref 0.1–1.0)
Monocytes Relative: 9.7 % (ref 3.0–12.0)
Neutro Abs: 3 10*3/uL (ref 1.4–7.7)
Neutrophils Relative %: 60.8 % (ref 43.0–77.0)
Platelets: 259 10*3/uL (ref 150.0–400.0)
RBC: 4.81 Mil/uL (ref 3.87–5.11)
RDW: 13.7 % (ref 11.5–14.6)
WBC: 4.9 10*3/uL (ref 4.5–10.5)

## 2012-08-24 LAB — LIPID PANEL
Cholesterol: 150 mg/dL (ref 0–200)
HDL: 41.3 mg/dL (ref >39.00)
LDL Cholesterol: 85 mg/dL (ref 0–99)
VLDL: 23.6 mg/dL (ref 0.0–40.0)

## 2012-08-24 LAB — BASIC METABOLIC PANEL
CO2: 29 mEq/L (ref 19–32)
Calcium: 9.2 mg/dL (ref 8.4–10.5)
Chloride: 107 mEq/L (ref 96–112)
Sodium: 141 mEq/L (ref 135–145)

## 2012-08-24 MED ORDER — HYDROCHLOROTHIAZIDE 25 MG PO TABS: ORAL_TABLET | ORAL | Status: DC

## 2012-08-24 MED ORDER — GABAPENTIN 100 MG PO CAPS: 100.0000 mg | ORAL_CAPSULE | Freq: Three times a day (TID) | ORAL | Status: DC

## 2012-08-24 MED ORDER — LEVOTHYROXINE SODIUM 50 MCG PO TABS: 50.0000 ug | ORAL_TABLET | ORAL | Status: DC

## 2012-08-24 MED ORDER — LOSARTAN POTASSIUM 50 MG PO TABS: 50.0000 mg | ORAL_TABLET | Freq: Every day | ORAL | Status: DC

## 2012-08-24 NOTE — Patient Instructions (Addendum)
Preventive Health Care: Exercise  30-45  minutes a day, 3-4 days a week. Walking is especially valuable in preventing Osteoporosis. Eat a low-fat diet with lots of fruits and vegetables, up to 7-9 servings per day. Consume less than 30 grams of sugar per day from foods & drinks with High Fructose Corn Syrup as #1,2,3 or #4 on label. Minimal Blood Pressure Goal= AVERAGE < 140/90;  Ideal is an AVERAGE < 135/85. This AVERAGE should be calculated from @ least 5-7 BP readings taken @ different times of day on different days of week. You should not respond to isolated BP readings , but rather the AVERAGE for that week .Please bring your  blood pressure cuff to office visits to verify that it is reliable.It  can also be checked against the blood pressure device at the pharmacy. Finger or wrist cuffs are not dependable; an arm cuff is.  If you activate My Chart; the results can be released to you as soon as they populate from the lab. If you choose not to use this program; the labs have to be reviewed, copied & mailed   causing a delay in getting the results to you.

## 2012-08-24 NOTE — Progress Notes (Signed)
Subjective:    Patient ID: Crystal Kidd, female    DOB: 1946/02/19, 67 y.o.   MRN: [id number]  HPI Medicare Wellness Visit:  The following psychosocial & medical history were reviewed as required by Medicare.   Social history: caffeine: 1 cup coffee / day & 1 can soda , alcohol:  rare ,  tobacco use : never  & exercise : care giver for relative.   Home & personal  safety / fall risk: no issues, activities of daily living: no limitations , seatbelt use : yes , and smoke alarm employment : yes .  Power of Attorney/Living Will status :?  in place  Vision ( as recorded per Nurse) & Hearing  evaluation :  Ophth exam today; no hearing exam. Orientation :oriented X 3 , memory & recall :good,  math testing: good,and mood & affect : normal . Depression / anxiety: denied Travel history : last 1996 [place] , immunization status :? Up to date , transfusion history:  1968 post partum, and preventive health surveillance ( colonoscopies, BMD , etc as per protocol/ [facility]): colonoscopy & mammograms up to date, Dental care: every 6 months . Chart reviewed &  Updated. Active issues reviewed & addressed.       Review of Systems  HYPERTENSION: Disease Monitoring: Blood pressure range-1206-140/69-80  Chest pain, palpitations- no       Dyspnea- no Medications: Compliance- yes  Lightheadedness,Syncope- rare lightheadedness posturally   Edema- no  HYPERLIPIDEMIA: Disease Monitoring: See symptoms for Hypertension Medications: Compliance- yes  Abd pain, bowel changes- no Muscle aches- no         Objective:   Physical Exam Gen.: Healthy and well-nourished in appearance. Alert, appropriate and cooperative throughout exam.   Head: Normocephalic without obvious abnormalities  Eyes: No corneal or conjunctival inflammation noted. Pupils equal round reactive to light and accommodation.  Extraocular motion intact. Vision grossly normal with lenses. Ears: External  ear exam reveals no significant lesions or  deformities. Canals clear .TMs normal. Hearing is grossly normal bilaterally. Nose: External nasal exam reveals no deformity or inflammation. Nasal mucosa are pink and moist. No lesions or exudates noted.   Mouth: Oral mucosa and oropharynx reveal no lesions or exudates. Teeth in good repair. Neck: No deformities, masses, or tenderness noted. Range of motion normal. Thyroid firm w/o enlargement or nodules. Lungs: Normal respiratory effort; chest expands symmetrically. Lungs are clear to auscultation without rales, wheezes, or increased work of breathing. Chest: R clavicular head > L. Heart: Normal rate and rhythm. Normal S1 and S2. No gallop, click, or rub. S4 w/o murmur. Abdomen: Bowel sounds normal; abdomen soft and nontender. No masses, organomegaly or hernias noted.                                    Musculoskeletal/extremities: No deformity or scoliosis noted of  the thoracic or lumbar spine. No clubbing, cyanosis, edema, or significant extremity  deformity noted. Range of motion normal .Tone & strength  normal.Joints normal / reveal mild  DJD DIP changes, L > R. Nail health good. Able to lie down & sit up w/o help. Negative SLR bilaterally Vascular: Carotid, radial artery, dorsalis pedis and  posterior tibial pulses are full and equal. No bruits present. Neurologic: Alert and oriented x3. Deep tendon reflexes symmetrical and normal. Rhomberg & finger to nose testing normal.Gait  normal.        Skin: Intact without suspicious  lesions or rashes. Lymph: No cervical, axillary lymphadenopathy present. Psych: Mood and affect are normal. Normally interactive                                                                                         Assessment & Plan:  #1 Medicare Wellness Exam; criteria met ; data entered #2 Problem List reviewed ; Assessment/ Recommendations made Plan: see Orders

## 2012-08-25 ENCOUNTER — Ambulatory Visit: Payer: Medicare Other

## 2012-08-25 DIAGNOSIS — R7309 Other abnormal glucose: Secondary | ICD-10-CM

## 2012-08-25 LAB — HEMOGLOBIN A1C: Hgb A1c MFr Bld: 5.6 % (ref 4.6–6.5)

## 2012-08-28 LAB — VITAMIN D 1,25 DIHYDROXY: Vitamin D 1, 25 (OH)2 Total: 42 pg/mL (ref 18–72)

## 2012-09-01 ENCOUNTER — Ambulatory Visit (INDEPENDENT_AMBULATORY_CARE_PROVIDER_SITE_OTHER)
Admission: RE | Admit: 2012-09-01 | Discharge: 2012-09-01 | Disposition: A | Discharge status: Home / Self Care | Payer: Medicare Other | Source: Ambulatory Visit

## 2012-09-01 DIAGNOSIS — M949 Disorder of cartilage, unspecified: Secondary | ICD-10-CM

## 2012-09-01 DIAGNOSIS — M899 Disorder of bone, unspecified: Secondary | ICD-10-CM

## 2012-09-13 ENCOUNTER — Other Ambulatory Visit: Payer: Self-pay | Admitting: Internal Medicine

## 2012-09-14 MED ORDER — SIMVASTATIN 20 MG PO TABS: 20.0000 mg | ORAL_TABLET | Freq: Every evening | ORAL | Status: DC

## 2012-09-29 ENCOUNTER — Other Ambulatory Visit (INDEPENDENT_AMBULATORY_CARE_PROVIDER_SITE_OTHER): Payer: Medicare Other

## 2012-09-29 DIAGNOSIS — Z1289 Encounter for screening for malignant neoplasm of other sites: Secondary | ICD-10-CM

## 2012-09-29 LAB — HEMOCCULT GUIAC POC 1CARD (OFFICE)
Card #2 Fecal Occult Blod, POC: NEGATIVE
Card #3 Fecal Occult Blood, POC: NEGATIVE
Fecal Occult Blood, POC: NEGATIVE

## 2012-11-17 ENCOUNTER — Telehealth: Payer: Self-pay | Admitting: Gastroenterology

## 2012-11-17 MED ORDER — OMEPRAZOLE 40 MG PO CPDR: 40.0000 mg | DELAYED_RELEASE_CAPSULE | Freq: Every day | ORAL | Status: DC

## 2012-11-17 NOTE — Telephone Encounter (Signed)
Informed patient that we need to see her in the office soon if she is getting prescription refills with [place] unless she would like to get her omeprazole with her PCP. Patient states she was told to come every 2 years for follow up with Dr. Russella Dar. Told patient if she is getting prescription refills with [place] then it is every year we have to see her. Patient states if we can give her one 3 month supply until she can call her PCP then she will call them for future refills. Agreed and sent one 3 month supply to [facility].

## 2012-12-09 ENCOUNTER — Encounter: Payer: Self-pay | Admitting: Internal Medicine

## 2013-02-17 ENCOUNTER — Encounter: Payer: Self-pay | Admitting: Gastroenterology

## 2013-02-17 ENCOUNTER — Ambulatory Visit (INDEPENDENT_AMBULATORY_CARE_PROVIDER_SITE_OTHER): Payer: Medicare Other | Admitting: Gastroenterology

## 2013-02-17 VITALS — BP 132/70 | HR 78 | Ht 63.0 in | Wt 145.2 lb

## 2013-02-17 DIAGNOSIS — R143 Flatulence: Secondary | ICD-10-CM

## 2013-02-17 DIAGNOSIS — K219 Gastro-esophageal reflux disease without esophagitis: Secondary | ICD-10-CM

## 2013-02-17 DIAGNOSIS — R142 Eructation: Secondary | ICD-10-CM

## 2013-02-17 MED ORDER — OMEPRAZOLE 40 MG PO CPDR: 40.0000 mg | DELAYED_RELEASE_CAPSULE | Freq: Every day | ORAL | Status: DC

## 2013-02-17 NOTE — Patient Instructions (Addendum)
We sent omeprazole to your mail order pharmacy.   Use Gas-x over the counter four times a day as needed for gas and bloating.  Use a lactose free diet x 1 week.   You have been given a gas prevention diet.   Thank you for choosing me and [facility].  Venita Lick. Pleas Koch., MD., Clementeen Graham

## 2013-02-17 NOTE — Progress Notes (Signed)
History of Present Illness: This is a   Current Medications, Allergies, Past Medical History, Past Surgical History, Family History and Social History were reviewed in [employer] record.  Physical Exam: General: Well developed , well nourished, no acute distress Head: Normocephalic and atraumatic Eyes:  sclerae anicteric, EOMI Ears: Normal auditory acuity Mouth: No deformity or lesions Lungs: Clear throughout to auscultation Heart: Regular rate and rhythm; no murmurs, rubs or bruits Abdomen: Soft, non tender and non distended. No masses, hepatosplenomegaly or hernias noted. Normal Bowel sounds Musculoskeletal: Symmetrical with no gross deformities  Pulses:  Normal pulses noted Extremities: No clubbing, cyanosis, edema or deformities noted Neurological: Alert oriented x 4, grossly nonfocal Psychological:  Alert and cooperative. Normal mood and affect  Assessment and Recommendations:  1. GERD with a history of an esophageal stricture. Continue standard antireflux measures and omeprazole 40 mg daily. Continue bone density studies through primary care office.  2. Family history of colon cancer in a maternal aunt. We discussed the recommended guidelines for every 10 year colonoscopies, 10/2015, for a second degree relatives with colon cancer. Since is more than 5 years since her last colonoscopy I would continue with annual Hemoccults by her gynecologist or PCP.   3. Diverticulosis. Long-term high-fiber diet with adequate daily water intake.   4. Intestinal gas. Lactose free diet for 1 week. Low gas diet. Gas-X qid prn.

## 2013-03-29 ENCOUNTER — Telehealth: Payer: Self-pay | Admitting: Internal Medicine

## 2013-03-29 ENCOUNTER — Ambulatory Visit (INDEPENDENT_AMBULATORY_CARE_PROVIDER_SITE_OTHER): Payer: Medicare Other | Admitting: Internal Medicine

## 2013-03-29 ENCOUNTER — Encounter: Payer: Self-pay | Admitting: Internal Medicine

## 2013-03-29 VITALS — BP 150/80 | HR 80 | Temp 98.2°F | Ht 63.75 in | Wt 145.8 lb

## 2013-03-29 DIAGNOSIS — R5381 Other malaise: Secondary | ICD-10-CM

## 2013-03-29 DIAGNOSIS — I10 Essential (primary) hypertension: Secondary | ICD-10-CM

## 2013-03-29 LAB — BASIC METABOLIC PANEL
BUN: 10 mg/dL (ref 6–23)
Calcium: 9.3 mg/dL (ref 8.4–10.5)
Chloride: 107 mEq/L (ref 96–112)
Creatinine, Ser: 0.6 mg/dL (ref 0.4–1.2)
GFR: 103.74 mL/min (ref >60.00)

## 2013-03-29 LAB — CBC WITH DIFFERENTIAL/PLATELET
Basophils Relative: 1.1 % (ref 0.0–3.0)
Eosinophils Relative: 6.8 % — ABNORMAL HIGH (ref 0.0–5.0)
Lymphocytes Relative: 24.1 % (ref 12.0–46.0)
Monocytes Absolute: 0.6 10*3/uL (ref 0.1–1.0)
Monocytes Relative: 11 % (ref 3.0–12.0)
Neutrophils Relative %: 57 % (ref 43.0–77.0)
Platelets: 242 10*3/uL (ref 150.0–400.0)
RBC: 4.9 Mil/uL (ref 3.87–5.11)
WBC: 5.2 10*3/uL (ref 4.5–10.5)

## 2013-03-29 LAB — TSH: TSH: 1.56 u[IU]/mL (ref 0.35–5.50)

## 2013-03-29 MED ORDER — LOSARTAN POTASSIUM 50 MG PO TABS: ORAL_TABLET | ORAL | Status: DC

## 2013-03-29 NOTE — Progress Notes (Signed)
  Subjective:    Patient ID: Crystal Kidd, female    DOB: 06-Dec-1945, 67 y.o.   MRN: [id number]  HPI CHRONIC HYPERTENSION follow-up:  Home blood pressure range 120/67-170/72( X1) , usually 150-160/70 since 8/16. This has been associated with fatigue.  Patient is compliant with medications except HCTZ is as needed  No adverse effects noted from medication  Exercise program as walking or mowing 3 times  per week for  20-76minutes  No specific dietary program but no cooking with salt     Review of Systems No chest pain, palpitations, dyspnea, claudication,edema or paroxysmal nocturnal dyspnea described  No significant lightheadedness, headache, epistaxis, or syncope  She has  had increased heartburn which she relates to ingesting cucumbers in salads 8/17          Objective:   Physical Exam  Appears healthy and well-nourished & in no acute distress  No carotid bruits are present.No neck pain distention present at 10 - 15 degrees. Thyroid normal to palpation.  Asymmetry of the thyroid; right lobe small; left lobe upper limits of normal. No nodules palpable.  Heart rhythm and rate are normal with no significant murmurs or gallops. S 4  Chest is clear with no increased work of breathing  There is no evidence of aortic aneurysm or renal artery bruits  Abdomen soft with no organomegaly or masses. No HJR  No clubbing, cyanosis or edema present.DIP OA changes LUE > RUE  Pedal pulses are intact   No ischemic skin changes are present . Nails  Healthy .Scattered vitiligo Alert and oriented. Strength, tone, DTRs reflexes normal       Assessment & Plan:  #1 HTN #2 fatigue See orders

## 2013-03-29 NOTE — Patient Instructions (Addendum)

## 2013-03-29 NOTE — Telephone Encounter (Signed)
Patient Information:  Caller Name: Trena  Phone: [phone]  Patient: Crystal Kidd, Crystal Kidd  Gender: Female  DOB: 28-Jan-1946  Age: 67 Years  PCP: Marga Melnick  Office Follow Up:  Does the office need to follow up with this patient?: No  Instructions For The Office: N/A  RN Note:  Asking if needs to increse BP medication.  Symptoms  Reason For Call & Symptoms: Increasing systolic blood pressure.  BP range 150-170/67-72.  BP 120/67 at 0645 then 151/70 at 0830. Noted > stress lately.  Reviewed Health History In EMR: Yes  Reviewed Medications In EMR: Yes  Reviewed Allergies In EMR: Yes  Reviewed Surgeries / Procedures: Yes  Date of Onset of Symptoms: 03/27/2013  Treatments Tried: walking daily  Treatments Tried Worked: No  Guideline(s) Used:  High Blood Pressure  Disposition Per Guideline:   See Within 2 Weeks in Office  Reason For Disposition Reached:   BP > 140/90 and is taking BP medications  Advice Given:  General:  Untreated high blood pressure may cause damage to the heart, brain, kidneys, and eyes.  Treatment of high blood pressure can reduce the risk of stroke, heart attack, and heart failure.  The goal of blood pressure treatment for most patients with hypertension is to keep the blood pressure under 140/90.  Lifestyle Changes  Maintain a healthy weight. Lose weight if you are overweight.  Do 30 minutes of aerobic physical activity (e.g., brisk walking) most days of the week.  Call Back If:  Headache, blurred vision, difficulty talking, or difficulty walking occurs  Chest pain or difficulty breathing occurs  You want to go in to the office for a blood pressure check  You become worse.  Patient Will Follow Care Advice:  YES  Appointment Scheduled:  03/29/2013 11:00:00 Appointment Scheduled Provider:  Marga Melnick

## 2013-03-30 ENCOUNTER — Encounter: Payer: Self-pay | Admitting: Internal Medicine

## 2013-03-31 NOTE — Telephone Encounter (Signed)
Pt has on her med list that she's taking Zrytec Allergy 10mg  daily.   Please advise.//AB/CMA

## 2013-04-07 ENCOUNTER — Encounter: Payer: Self-pay | Admitting: Internal Medicine

## 2013-04-08 MED ORDER — FLUTICASONE PROPIONATE 50 MCG/ACT NA SUSP: 2.0000 | Freq: Every day | NASAL | Status: DC

## 2013-04-08 NOTE — Telephone Encounter (Signed)
Does the pt need to be seen or can a nasal spray be called in.  Please advise.//AB/CMA

## 2013-06-17 ENCOUNTER — Other Ambulatory Visit: Payer: Self-pay

## 2013-06-22 ENCOUNTER — Encounter: Payer: Self-pay | Admitting: Internal Medicine

## 2013-06-23 ENCOUNTER — Other Ambulatory Visit: Payer: Self-pay | Admitting: *Deleted

## 2013-06-23 DIAGNOSIS — I10 Essential (primary) hypertension: Secondary | ICD-10-CM

## 2013-06-23 MED ORDER — LOSARTAN POTASSIUM 50 MG PO TABS: ORAL_TABLET | ORAL | Status: DC

## 2013-06-23 NOTE — Telephone Encounter (Signed)
Losartan refilled 

## 2013-06-23 NOTE — Telephone Encounter (Signed)
Patient is calling in regards to her losartan (COZAAR) 50 MG refill. She needs this sent to [facility] instead. Requesting 90-day refill. Please advise.

## 2013-06-23 NOTE — Telephone Encounter (Signed)
Losartan resent to [facility] per patient request

## 2013-07-07 ENCOUNTER — Other Ambulatory Visit: Payer: Self-pay | Admitting: *Deleted

## 2013-07-07 DIAGNOSIS — E039 Hypothyroidism, unspecified: Secondary | ICD-10-CM

## 2013-07-07 MED ORDER — LEVOTHYROXINE SODIUM 50 MCG PO TABS: 50.0000 ug | ORAL_TABLET | ORAL | Status: DC

## 2013-07-07 NOTE — Telephone Encounter (Signed)
Levothyroxine refilled per protocol 

## 2013-07-16 ENCOUNTER — Other Ambulatory Visit: Payer: Self-pay | Admitting: *Deleted

## 2013-07-16 ENCOUNTER — Telehealth: Payer: Self-pay | Admitting: Internal Medicine

## 2013-07-16 DIAGNOSIS — E039 Hypothyroidism, unspecified: Secondary | ICD-10-CM

## 2013-07-16 MED ORDER — SIMVASTATIN 20 MG PO TABS: 20.0000 mg | ORAL_TABLET | Freq: Every evening | ORAL | Status: DC

## 2013-07-16 MED ORDER — LEVOTHYROXINE SODIUM 50 MCG PO TABS: 50.0000 ug | ORAL_TABLET | ORAL | Status: DC

## 2013-07-16 NOTE — Telephone Encounter (Signed)
Simvastatin refilled per protocol.  

## 2013-07-16 NOTE — Telephone Encounter (Signed)
Patient states that [facility] never received her levothyroxine (SYNTHROID, LEVOTHROID) 50 MCG tablet refill and she is also needing a refill on simvastatin (ZOCOR) 20 MG tablet. Please advise.

## 2013-07-16 NOTE — Telephone Encounter (Signed)
Both medications sent to [facility]. JG//CMA

## 2013-08-18 ENCOUNTER — Telehealth: Payer: Self-pay | Admitting: *Deleted

## 2013-08-18 NOTE — Telephone Encounter (Signed)
Patient is experiencing problems with her blood pressure going up and down. Patient wanted to talk to a nurse

## 2013-08-24 NOTE — Telephone Encounter (Signed)
Ok but monitor BP.See goals: Minimal Blood Pressure Goal= AVERAGE < 140/90;  Ideal is an AVERAGE < 135/85. This AVERAGE should be calculated from @ least 5-7 BP readings taken @ different times of day on different days of week.

## 2013-08-24 NOTE — Telephone Encounter (Signed)
Spoke with the pt on 08-20-13 and she stated that her Losartan was increased to 11/2 tablets a day.  Pt stated that her BP has been averaging 130/72.  But this week she noticed her BP has been running low.  The top number has been between (112-116), but the bottom number was (79,48,01).  Pt stated that she has been exercising for 4 weeks 3x/week 40 mins each time since Nov.  Pt wanting to know if she should stop taking the 1/2 pill and only take 1 tablet.  Pt advise.//AB/CMA

## 2013-08-25 NOTE — Telephone Encounter (Signed)
Called and spoke with patient regarding Dr. Clayborn Heron recommendations. She stated she would continue to monitor her BP and bring them in next week to her appointment. JG//CMA

## 2013-08-31 ENCOUNTER — Telehealth: Payer: Self-pay

## 2013-08-31 NOTE — Telephone Encounter (Signed)
Medication and allergies:  Reviewed and updated  90 day supply/mail order: [facility] Local pharmacy: [place]   Immunizations due:  UED  A/P:   No changes to FH, PSH or Personal Hx Flu vaccine--04/2013 Tdap--04/2004 PNA--06/2010 Shingles--07/2009 MMG--11/2012--nml Bone Density--08/2012--osteopenia CCS--2007--Dr Stark--per 02/2013 visit should have iFOB by pcp or gyn due to hx) Endoscopy 2010  To Discuss with Provider: Please pass along Patient had questions concerning referrals per new insurance--patient will not need to do anything.  Her specialist that need referrals will contact PCP. Referrals are only valid for 3 months

## 2013-09-02 ENCOUNTER — Ambulatory Visit (INDEPENDENT_AMBULATORY_CARE_PROVIDER_SITE_OTHER): Payer: Medicare HMO | Admitting: Internal Medicine

## 2013-09-02 ENCOUNTER — Encounter: Payer: Self-pay | Admitting: Internal Medicine

## 2013-09-02 VITALS — BP 147/73 | HR 81 | Temp 98.3°F | Ht 63.0 in | Wt 117.6 lb

## 2013-09-02 DIAGNOSIS — K5730 Diverticulosis of large intestine without perforation or abscess without bleeding: Secondary | ICD-10-CM

## 2013-09-02 DIAGNOSIS — E559 Vitamin D deficiency, unspecified: Secondary | ICD-10-CM

## 2013-09-02 DIAGNOSIS — Z0000 Encounter for general adult medical examination without abnormal findings: Secondary | ICD-10-CM

## 2013-09-02 DIAGNOSIS — Z9289 Personal history of other medical treatment: Secondary | ICD-10-CM

## 2013-09-02 DIAGNOSIS — E039 Hypothyroidism, unspecified: Secondary | ICD-10-CM

## 2013-09-02 DIAGNOSIS — Z9189 Other specified personal risk factors, not elsewhere classified: Secondary | ICD-10-CM

## 2013-09-02 DIAGNOSIS — E785 Hyperlipidemia, unspecified: Secondary | ICD-10-CM

## 2013-09-02 DIAGNOSIS — I10 Essential (primary) hypertension: Secondary | ICD-10-CM

## 2013-09-02 DIAGNOSIS — R51 Headache: Secondary | ICD-10-CM

## 2013-09-02 LAB — HEPATIC FUNCTION PANEL
ALT: 17 U/L (ref 0–35)
AST: 18 U/L (ref 0–37)
Albumin: 3.9 g/dL (ref 3.5–5.2)
Alkaline Phosphatase: 81 U/L (ref 39–117)
BILIRUBIN TOTAL: 0.6 mg/dL (ref 0.3–1.2)
Bilirubin, Direct: 0 mg/dL (ref 0.0–0.3)
Total Protein: 7.1 g/dL (ref 6.0–8.3)

## 2013-09-02 LAB — CBC WITH DIFFERENTIAL/PLATELET
Basophils Absolute: 0 10*3/uL (ref 0.0–0.1)
Basophils Relative: 0.7 % (ref 0.0–3.0)
Eosinophils Absolute: 0.2 10*3/uL (ref 0.0–0.7)
Eosinophils Relative: 3.9 % (ref 0.0–5.0)
HEMATOCRIT: 42.8 % (ref 36.0–46.0)
Hemoglobin: 14.3 g/dL (ref 12.0–15.0)
LYMPHS ABS: 1.4 10*3/uL (ref 0.7–4.0)
Lymphocytes Relative: 21.7 % (ref 12.0–46.0)
MCHC: 33.4 g/dL (ref 30.0–36.0)
MCV: 88.5 fl (ref 78.0–100.0)
MONO ABS: 0.6 10*3/uL (ref 0.1–1.0)
Monocytes Relative: 9.3 % (ref 3.0–12.0)
NEUTROS PCT: 64.4 % (ref 43.0–77.0)
Neutro Abs: 4.1 10*3/uL (ref 1.4–7.7)
PLATELETS: 270 10*3/uL (ref 150.0–400.0)
RBC: 4.84 Mil/uL (ref 3.87–5.11)
RDW: 13.3 % (ref 11.5–14.6)
WBC: 6.4 10*3/uL (ref 4.5–10.5)

## 2013-09-02 LAB — BASIC METABOLIC PANEL
BUN: 12 mg/dL (ref 6–23)
CO2: 29 mEq/L (ref 19–32)
CREATININE: 0.8 mg/dL (ref 0.4–1.2)
Calcium: 9.3 mg/dL (ref 8.4–10.5)
Chloride: 103 mEq/L (ref 96–112)
GFR: 79.18 mL/min (ref >60.00)
GLUCOSE: 98 mg/dL (ref 70–99)
POTASSIUM: 3.7 meq/L (ref 3.5–5.1)
Sodium: 140 mEq/L (ref 135–145)

## 2013-09-02 LAB — LIPID PANEL
CHOLESTEROL: 159 mg/dL (ref 0–200)
HDL: 52.6 mg/dL (ref >39.00)
LDL Cholesterol: 86 mg/dL (ref 0–99)
Total CHOL/HDL Ratio: 3
Triglycerides: 102 mg/dL (ref 0.0–149.0)
VLDL: 20.4 mg/dL (ref 0.0–40.0)

## 2013-09-02 LAB — TSH: TSH: 2.71 u[IU]/mL (ref 0.35–5.50)

## 2013-09-02 MED ORDER — GABAPENTIN 100 MG PO CAPS: 100.0000 mg | ORAL_CAPSULE | Freq: Three times a day (TID) | ORAL | Status: DC

## 2013-09-02 MED ORDER — FLUTICASONE PROPIONATE 50 MCG/ACT NA SUSP: 2.0000 | Freq: Every day | NASAL | Status: DC

## 2013-09-02 MED ORDER — HYDROCHLOROTHIAZIDE 25 MG PO TABS: ORAL_TABLET | ORAL | Status: DC

## 2013-09-02 NOTE — Patient Instructions (Signed)

## 2013-09-02 NOTE — Progress Notes (Signed)
Pre visit review using our clinic review tool, if applicable. No additional management support is needed unless otherwise documented below in the visit note. 

## 2013-09-02 NOTE — Progress Notes (Signed)
Subjective:    Patient ID: Crystal Kidd, female    DOB: [birth year], 68 y.o.   MRN: [id number]  HPI  Medicare Wellness Visit: Psychosocial and medical history were reviewed as required by Medicare (history related to abuse, antisocial behavior , firearm risk). Social history: Caffeine: 2 cups coffee & diet cola , Alcohol: no , Tobacco XTG:GYIRS Exercise:3X a week, 30 minutes to one hour Personal safety/fall risk:None Limitations of activities of daily living:None Seatbelt/ smoke alarm use:Yes Healthcare Power of Attorney/Living Will status:in place  Ophthalmologic exam status:UTD Hearing evaluation status:not current Orientation: Oriented X 4 Memory and recall: Intact Math testing:Intact  Depression/anxiety assessment: no Foreign travel history:2014 [place] Immunization status for influenza/pneumonia/ shingles /tetanus:UTD Transfusion history:1968 Preventive health care maintenance status: Colonoscopy/BMD/mammogram/Pap as per protocol/standard care: UTD (TAH/USO) Dental care:every 6 mos Chart reviewed and updated. Active issues reviewed and addressed as documented below.   Review of Systems Blood pressure range 106/54-135/70; average 119/62 Compliant with anti hypertemsive medication. No lightheadedness or other adverse medication effect described.  Significant headaches, epistaxis, chest pain, palpitations, exertional dyspnea, claudication, paroxysmal nocturnal dyspnea, or edema absent.     Objective:   Physical Exam  Gen.: Healthy and well-nourished in appearance. Alert, appropriate and cooperative throughout exam. Appears younger than stated age  Head: Normocephalic without obvious abnormalities Eyes: No corneal or conjunctival inflammation noted. Pupils equal round reactive to light and accommodation. Extraocular motion intact.  Ears: External  ear exam reveals no significant lesions or deformities. Canals clear .TMs normal. Hearing is grossly normal bilaterally. Nose:  External nasal exam reveals no deformity or inflammation. Nasal mucosa are pink and moist. No lesions or exudates noted. Septum slightly dislocated Mouth: Oral mucosa and oropharynx reveal no lesions or exudates. Teeth in good repair. Neck: No deformities, masses, or tenderness noted. Range of motion &. Thyroid normal. Lungs: Normal respiratory effort; chest expands symmetrically. Lungs are clear to auscultation without rales, wheezes, or increased work of breathing. Heart: Normal rate and rhythm. Normal S1 and S2. No gallop, click, or rub. S4 w/o murmur. Abdomen: Bowel sounds normal; abdomen soft and nontender. No masses, organomegaly or hernias noted. Genitalia:  as per Gyn                                  Musculoskeletal/extremities: No deformity or scoliosis noted of  the thoracic or lumbar spine.   No clubbing, cyanosis, edema, or significant extremity  deformity noted. Range of motion normal .Tone & strength normal. Hand joints  reveal mild  DJD DIP changes & 5th L PIP fusiform change. Fingernail health good. Able to lie down & sit up w/o help. Negative SLR bilaterally Vascular: Carotid, radial artery, dorsalis pedis and  posterior tibial pulses are full and equal. No bruits present. Neurologic: Alert and oriented x3. Deep tendon reflexes symmetrical and normal.        Skin: Intact without suspicious lesions .scattered vitiligo Lymph: No cervical, axillary lymphadenopathy present. Psych: Mood and affect are normal. Normally interactive  Assessment & Plan:  #1 Medicare Wellness Exam; criteria met ; data entered #2 Problem List/Diagnoses reviewed Plan:  Assessments made/ Orders entered  

## 2013-09-03 LAB — HEPATITIS C ANTIBODY: HCV Ab: NEGATIVE

## 2013-09-13 ENCOUNTER — Telehealth: Payer: Self-pay

## 2013-09-13 DIAGNOSIS — H269 Unspecified cataract: Secondary | ICD-10-CM

## 2013-09-13 DIAGNOSIS — Z1239 Encounter for other screening for malignant neoplasm of breast: Secondary | ICD-10-CM

## 2013-09-13 NOTE — Telephone Encounter (Signed)
The patient called and is in need of two referrals:  1) for a mammogram at [facility]  2) Eye dr for her cataracts - [address] - [phone]

## 2013-09-15 ENCOUNTER — Telehealth: Payer: Self-pay | Admitting: Internal Medicine

## 2013-09-15 NOTE — Telephone Encounter (Signed)
Relevant patient education assigned to patient using Emmi. ° °

## 2013-09-20 ENCOUNTER — Telehealth: Payer: Self-pay | Admitting: Internal Medicine

## 2013-09-20 DIAGNOSIS — E039 Hypothyroidism, unspecified: Secondary | ICD-10-CM

## 2013-09-20 MED ORDER — OMEPRAZOLE 40 MG PO CPDR: 40.0000 mg | DELAYED_RELEASE_CAPSULE | Freq: Every day | ORAL | Status: DC

## 2013-09-20 MED ORDER — SIMVASTATIN 20 MG PO TABS: 20.0000 mg | ORAL_TABLET | Freq: Every evening | ORAL | Status: DC

## 2013-09-20 MED ORDER — LOSARTAN POTASSIUM 50 MG PO TABS: 50.0000 mg | ORAL_TABLET | Freq: Every day | ORAL | Status: DC

## 2013-09-20 MED ORDER — LEVOTHYROXINE SODIUM 50 MCG PO TABS: 50.0000 ug | ORAL_TABLET | ORAL | Status: DC

## 2013-09-20 NOTE — Telephone Encounter (Signed)
Rx's sent to the pharmacy by e-script.//AB/CMA 

## 2013-09-20 NOTE — Telephone Encounter (Signed)
[facility] called with the patient on [address] to request rx's be sent to them to fill for patient. Patient needs refills on:  levothyroxine (SYNTHROID, LEVOTHROID) 50 MCG tablet  losartan (COZAAR) 50 MG tablet simvastatin (ZOCOR) 20 MG tablet  omeprazole (PRILOSEC) 40 MG capsule   Can be sent electronically or faxed to Fax#: [phone]  Ph#: [phone]

## 2013-11-15 LAB — HM MAMMOGRAPHY: HM MAMMO: NORMAL

## 2013-12-03 ENCOUNTER — Ambulatory Visit: Payer: Medicare HMO | Admitting: Internal Medicine

## 2013-12-06 ENCOUNTER — Encounter: Payer: Self-pay | Admitting: Internal Medicine

## 2013-12-06 ENCOUNTER — Ambulatory Visit (INDEPENDENT_AMBULATORY_CARE_PROVIDER_SITE_OTHER): Payer: Commercial Managed Care - HMO | Admitting: Internal Medicine

## 2013-12-06 VITALS — BP 148/70 | HR 84 | Temp 98.2°F | Resp 18 | Ht 63.0 in | Wt 146.5 lb

## 2013-12-06 DIAGNOSIS — R918 Other nonspecific abnormal finding of lung field: Secondary | ICD-10-CM

## 2013-12-06 DIAGNOSIS — H269 Unspecified cataract: Secondary | ICD-10-CM | POA: Insufficient documentation

## 2013-12-06 DIAGNOSIS — Z90710 Acquired absence of both cervix and uterus: Secondary | ICD-10-CM | POA: Insufficient documentation

## 2013-12-06 DIAGNOSIS — M25519 Pain in unspecified shoulder: Secondary | ICD-10-CM

## 2013-12-06 DIAGNOSIS — E785 Hyperlipidemia, unspecified: Secondary | ICD-10-CM

## 2013-12-06 DIAGNOSIS — Z90721 Acquired absence of ovaries, unilateral: Secondary | ICD-10-CM

## 2013-12-06 NOTE — Assessment & Plan Note (Signed)
Follow up ct at appropriate intervals will be advised.

## 2013-12-06 NOTE — Assessment & Plan Note (Signed)
Well controlled on current statin therapy.   Liver enzymes are normal , no changes today.  , Lab Results  Component Value Date   CHOL 159 09/02/2013   HDL 52.60 09/02/2013   LDLCALC 86 09/02/2013   LDLDIRECT 182.4 09/14/2010   TRIG 102.0 09/02/2013   CHOLHDL 3 09/02/2013   Lab Results  Component Value Date   ALT 17 09/02/2013   AST 18 09/02/2013   ALKPHOS 81 09/02/2013   BILITOT 0.6 09/02/2013

## 2013-12-06 NOTE — Progress Notes (Signed)
Patient ID: Crystal Kidd, female   DOB: 1946-05-06, 68 y.o.   MRN: [id number]  Patient Active Problem List   Diagnosis Date Noted  . Pain in joint, shoulder region 12/06/2013  . S/P hysterectomy with oophorectomy 12/06/2013  . Cataract 12/06/2013  . Transfusion history   . Abnormal CT scan of lung 08/24/2012  . New onset of headaches after age 46 08/22/2011  . Cervical disc disorder with radiculopathy of cervical region 05/21/2010  . OTHER DYSPHAGIA 05/31/2009  . VITAMIN D DEFICIENCY 05/16/2009  . HYPOTHYROIDISM 12/26/2007  . DYSLIPIDEMIA 12/26/2007  . HYPERTENSION 12/26/2007  . GERD 12/26/2007  . DIVERTICULOSIS, COLON 12/26/2007  . VITILIGO 12/31/2006  . OSTEOPENIA 12/31/2006    Subjective:  CC:   Chief Complaint  Patient presents with  . Establish Care    HPI:   Crystal Kidd a 68 y.o. female who presents  Past Medical History  Diagnosis Date  . Diverticulosis   . GERD (gastroesophageal reflux disease)     PMH of esophageal stricture  . Allergic rhinoconjunctivitis   . Hyperlipidemia   . Hypertension   . Thyroid disease     hypothyroidism  . Vitiligo   . Osteopenia     BMD @[facility]   . Transfusion history 1968    post partum       @[facility] @  Past Surgical History  Procedure Laterality Date  . Rotator cuff repair      R shoulder  . Abdominal hysterectomy      for dysfunctional menses; USO with TAH  . Breast surgery      biopsy X 2  . Appendectomy    . Esophageal dilation      X2  . Colonoscopy      X2; Tics; Dr Fuller Plan, GI    History   Social History  . Marital Status: Widowed    Spouse Name: N/A    Number of Children: N/A  . Years of Education: N/A   Occupational History  . Not on file.   Social History Main Topics  . Smoking status: Never Smoker   . Smokeless tobacco: Never Used  . Alcohol Use: Yes     Comment:  rare wine  . Drug Use: No  . Sexual Activity: Not on file   Other Topics Concern  . Not on file   Social History  Narrative  . No narrative on file   Family History  Problem Relation Age of Onset  . Cancer Mother     breast&stomach  . Breast cancer Sister     mammograms @ [facility]  . Colon cancer Maternal Aunt   . Cancer Maternal Aunt 80    colon cA  . COPD Father   . Coronary artery disease Brother   . Diabetes Neg Hx   . Stroke Neg Hx   . Heart disease Neg Hx   . Cancer Sister 30    BREAST     Review of Systems:   The rest of the review of systems was negative except those addressed in the HPI.      Objective:  BP 148/70  Pulse 84  Temp(Src) 98.2 F (36.8 C) (Oral)  Resp 18  Ht 5\' 3"  (1.6 m)  Wt 146 lb 8 oz (66.452 kg)  BMI 25.96 kg/m2  SpO2 97%  General appearance: alert, cooperative and appears stated age Ears: normal TM's and external ear canals both ears Throat: lips, mucosa, and tongue normal; teeth and gums normal Neck: no adenopathy, no carotid  bruit, supple, symmetrical, trachea midline and thyroid not enlarged, symmetric, no tenderness/mass/nodules Back: symmetric, no curvature. ROM normal. No CVA tenderness. Lungs: clear to auscultation bilaterally Heart: regular rate and rhythm, S1, S2 normal, no murmur, click, rub or gallop Abdomen: soft, non-tender; bowel sounds normal; no masses,  no organomegaly Pulses: 2+ and symmetric Skin: Skin color, texture, turgor normal. No rashes or lesions Lymph nodes: Cervical, supraclavicular, and axillary nodes normal.  Assessment and Plan:  Abnormal CT scan of lung Follow up ct at appropriate intervals will be advised.   DYSLIPIDEMIA Well controlled on current statin therapy.   Liver enzymes are normal , no changes today.  , Lab Results  Component Value Date   CHOL 159 09/02/2013   HDL 52.60 09/02/2013   LDLCALC 86 09/02/2013   LDLDIRECT 182.4 09/14/2010   TRIG 102.0 09/02/2013   CHOLHDL 3 09/02/2013   Lab Results  Component Value Date   ALT 17 09/02/2013   AST 18 09/02/2013   ALKPHOS 81 09/02/2013   BILITOT 0.6  09/02/2013      Updated Medication List Outpatient Encounter Prescriptions as of 12/06/2013  Medication Sig  . calcium carbonate (TUMS) 500 MG chewable tablet Chew 1 tablet by mouth as needed.   Marland Kitchen CALCIUM PO Take by mouth daily.    . cetirizine (ZYRTEC ALLERGY) 10 MG tablet Take 10 mg by mouth daily.    Marland Kitchen FIBER SELECT GUMMIES PO Take by mouth.    . fluticasone (FLONASE) 50 MCG/ACT nasal spray Place 2 sprays into both nostrils daily.  Marland Kitchen gabapentin (NEURONTIN) 100 MG capsule Take 1 capsule (100 mg total) by mouth 3 (three) times daily. Every 8 hours as needed  . hydrochlorothiazide (HYDRODIURIL) 25 MG tablet 1/2 tablet daily prn  . levothyroxine (SYNTHROID, LEVOTHROID) 50 MCG tablet Take 1 tablet (50 mcg total) by mouth as directed. 1 by mouth daily except 1 1/2 by mouth M,W,& F  . losartan (COZAAR) 50 MG tablet Take 1 tablet (50 mg total) by mouth daily. 1 tablets by mouth once daily  . Multiple Vitamin (MULTIVITAMIN) tablet Take 1 tablet by mouth daily.    Marland Kitchen omeprazole (PRILOSEC) 40 MG capsule Take 1 capsule (40 mg total) by mouth daily.  . simvastatin (ZOCOR) 20 MG tablet Take 1 tablet (20 mg total) by mouth every evening.     Orders Placed This Encounter  Procedures  . [facility] MAMMOGRAPHY    No Follow-up on file.

## 2013-12-06 NOTE — Progress Notes (Signed)
Pre-visit discussion using our clinic review tool. No additional management support is needed unless otherwise documented below in the visit note.  

## 2013-12-08 ENCOUNTER — Encounter: Payer: Self-pay | Admitting: Internal Medicine

## 2014-02-07 ENCOUNTER — Other Ambulatory Visit: Payer: Self-pay | Admitting: Internal Medicine

## 2014-02-14 ENCOUNTER — Other Ambulatory Visit: Payer: Self-pay | Admitting: Internal Medicine

## 2014-02-18 ENCOUNTER — Other Ambulatory Visit: Payer: Self-pay | Admitting: Internal Medicine

## 2014-02-18 ENCOUNTER — Telehealth: Payer: Self-pay | Admitting: Internal Medicine

## 2014-02-18 MED ORDER — LOSARTAN POTASSIUM 50 MG PO TABS: 50.0000 mg | ORAL_TABLET | Freq: Every day | ORAL | Status: DC

## 2014-02-18 NOTE — Telephone Encounter (Signed)
losartan (COZAAR) 50 MG tablet °

## 2014-02-18 NOTE — Telephone Encounter (Signed)
Refill sent.

## 2014-03-07 ENCOUNTER — Telehealth: Payer: Self-pay | Admitting: Internal Medicine

## 2014-03-07 DIAGNOSIS — R5381 Other malaise: Secondary | ICD-10-CM

## 2014-03-07 DIAGNOSIS — E785 Hyperlipidemia, unspecified: Secondary | ICD-10-CM

## 2014-03-07 DIAGNOSIS — E039 Hypothyroidism, unspecified: Secondary | ICD-10-CM

## 2014-03-07 DIAGNOSIS — E559 Vitamin D deficiency, unspecified: Secondary | ICD-10-CM

## 2014-03-07 DIAGNOSIS — R5383 Other fatigue: Secondary | ICD-10-CM

## 2014-03-07 NOTE — Telephone Encounter (Signed)
The patient is needing lab orders put in the system . She is coming in for lab work on Thursday 7.30.15

## 2014-03-10 ENCOUNTER — Other Ambulatory Visit (INDEPENDENT_AMBULATORY_CARE_PROVIDER_SITE_OTHER): Payer: Commercial Managed Care - HMO

## 2014-03-10 DIAGNOSIS — E039 Hypothyroidism, unspecified: Secondary | ICD-10-CM

## 2014-03-10 DIAGNOSIS — E785 Hyperlipidemia, unspecified: Secondary | ICD-10-CM

## 2014-03-10 DIAGNOSIS — R5381 Other malaise: Secondary | ICD-10-CM

## 2014-03-10 DIAGNOSIS — R5383 Other fatigue: Principal | ICD-10-CM

## 2014-03-10 DIAGNOSIS — E559 Vitamin D deficiency, unspecified: Secondary | ICD-10-CM

## 2014-03-11 ENCOUNTER — Encounter: Payer: Self-pay | Admitting: Internal Medicine

## 2014-03-11 LAB — COMPREHENSIVE METABOLIC PANEL
A/G RATIO: 1.8 (ref 1.1–2.5)
ALBUMIN: 4 g/dL (ref 3.6–4.8)
ALK PHOS: 96 IU/L (ref 39–117)
ALT: 10 IU/L (ref 0–32)
AST: 16 IU/L (ref 0–40)
BUN / CREAT RATIO: 17 (ref 11–26)
BUN: 13 mg/dL (ref 8–27)
CO2: 27 mmol/L (ref 18–29)
Calcium: 9 mg/dL (ref 8.7–10.3)
Chloride: 103 mmol/L (ref 97–108)
Creatinine, Ser: 0.78 mg/dL (ref 0.57–1.00)
GFR calc Af Amer: 90 mL/min/{1.73_m2} (ref >59)
GFR calc non Af Amer: 78 mL/min/{1.73_m2} (ref >59)
Globulin, Total: 2.2 g/dL (ref 1.5–4.5)
Glucose: 92 mg/dL (ref 65–99)
Potassium: 4.5 mmol/L (ref 3.5–5.2)
SODIUM: 143 mmol/L (ref 134–144)
Total Bilirubin: 0.4 mg/dL (ref 0.0–1.2)
Total Protein: 6.2 g/dL (ref 6.0–8.5)

## 2014-03-11 LAB — CBC WITH DIFFERENTIAL/PLATELET
Basophils Absolute: 0.1 10*3/uL (ref 0.0–0.2)
Basos: 1 %
EOS: 9 %
Eosinophils Absolute: 0.5 10*3/uL — ABNORMAL HIGH (ref 0.0–0.4)
HEMATOCRIT: 41.2 % (ref 34.0–46.6)
HEMOGLOBIN: 14.2 g/dL (ref 11.1–15.9)
IMMATURE GRANULOCYTES: 0 %
Immature Grans (Abs): 0 10*3/uL (ref 0.0–0.1)
LYMPHS ABS: 1.5 10*3/uL (ref 0.7–3.1)
Lymphs: 31 %
MCH: 29.8 pg (ref 26.6–33.0)
MCHC: 34.5 g/dL (ref 31.5–35.7)
MCV: 86 fL (ref 79–97)
MONOS ABS: 0.6 10*3/uL (ref 0.1–0.9)
Monocytes: 11 %
NEUTROS ABS: 2.4 10*3/uL (ref 1.4–7.0)
Neutrophils Relative %: 48 %
RBC: 4.77 x10E6/uL (ref 3.77–5.28)
RDW: 13.7 % (ref 12.3–15.4)
WBC: 5 10*3/uL (ref 3.4–10.8)

## 2014-03-11 LAB — LIPID PANEL
CHOL/HDL RATIO: 3.3 ratio (ref 0.0–4.4)
Cholesterol, Total: 157 mg/dL (ref 100–199)
HDL: 47 mg/dL (ref >39)
LDL Calculated: 87 mg/dL (ref 0–99)
Triglycerides: 117 mg/dL (ref 0–149)
VLDL Cholesterol Cal: 23 mg/dL (ref 5–40)

## 2014-03-11 LAB — VITAMIN D 25 HYDROXY (VIT D DEFICIENCY, FRACTURES): VIT D 25 HYDROXY: 35.5 ng/mL (ref 30.0–100.0)

## 2014-03-11 LAB — TSH: TSH: 4.28 u[IU]/mL (ref 0.450–4.500)

## 2014-04-11 ENCOUNTER — Other Ambulatory Visit: Payer: Self-pay | Admitting: Internal Medicine

## 2014-04-19 ENCOUNTER — Other Ambulatory Visit: Payer: Self-pay | Admitting: Internal Medicine

## 2014-04-20 ENCOUNTER — Ambulatory Visit (INDEPENDENT_AMBULATORY_CARE_PROVIDER_SITE_OTHER): Payer: Commercial Managed Care - HMO

## 2014-04-20 DIAGNOSIS — Z23 Encounter for immunization: Secondary | ICD-10-CM

## 2014-04-25 ENCOUNTER — Other Ambulatory Visit: Payer: Self-pay | Admitting: Internal Medicine

## 2014-04-25 ENCOUNTER — Telehealth: Payer: Self-pay | Admitting: Internal Medicine

## 2014-04-25 DIAGNOSIS — E039 Hypothyroidism, unspecified: Secondary | ICD-10-CM

## 2014-04-25 MED ORDER — SIMVASTATIN 20 MG PO TABS: 20.0000 mg | ORAL_TABLET | Freq: Every evening | ORAL | Status: DC

## 2014-04-25 MED ORDER — LEVOTHYROXINE SODIUM 50 MCG PO TABS: 50.0000 ug | ORAL_TABLET | ORAL | Status: DC

## 2014-04-25 MED ORDER — OMEPRAZOLE 40 MG PO CPDR: 40.0000 mg | DELAYED_RELEASE_CAPSULE | Freq: Every day | ORAL | Status: DC

## 2014-04-25 MED ORDER — LOSARTAN POTASSIUM 50 MG PO TABS: 50.0000 mg | ORAL_TABLET | Freq: Every day | ORAL | Status: DC

## 2014-04-25 NOTE — Telephone Encounter (Signed)
Patient called for refill request to be sent to [facility] refill sent.

## 2014-05-03 ENCOUNTER — Ambulatory Visit: Payer: Commercial Managed Care - HMO

## 2014-05-05 ENCOUNTER — Telehealth: Payer: Self-pay

## 2014-05-05 NOTE — Telephone Encounter (Signed)
The patient has an apt with her dermatologist (Dr.Susan Stinehelfer) in October  [place] # [id number]

## 2014-05-06 ENCOUNTER — Encounter: Payer: Self-pay | Admitting: Internal Medicine

## 2014-05-11 ENCOUNTER — Ambulatory Visit (INDEPENDENT_AMBULATORY_CARE_PROVIDER_SITE_OTHER): Payer: Commercial Managed Care - HMO | Admitting: *Deleted

## 2014-05-11 DIAGNOSIS — Z23 Encounter for immunization: Secondary | ICD-10-CM

## 2014-05-11 NOTE — Progress Notes (Signed)
Pt here for Tdap, has been 10 years since last Td. States she spoke to her insurance company and they will cover it in the office. Pt tolerated without difficulty

## 2014-06-13 ENCOUNTER — Ambulatory Visit (INDEPENDENT_AMBULATORY_CARE_PROVIDER_SITE_OTHER): Payer: Commercial Managed Care - HMO | Admitting: Internal Medicine

## 2014-06-13 ENCOUNTER — Encounter: Payer: Self-pay | Admitting: Internal Medicine

## 2014-06-13 VITALS — BP 134/78 | HR 74 | Temp 98.3°F | Resp 14 | Ht 63.0 in | Wt 142.8 lb

## 2014-06-13 DIAGNOSIS — E038 Other specified hypothyroidism: Secondary | ICD-10-CM

## 2014-06-13 DIAGNOSIS — E034 Atrophy of thyroid (acquired): Secondary | ICD-10-CM

## 2014-06-13 DIAGNOSIS — E559 Vitamin D deficiency, unspecified: Secondary | ICD-10-CM

## 2014-06-13 DIAGNOSIS — Z23 Encounter for immunization: Secondary | ICD-10-CM

## 2014-06-13 DIAGNOSIS — R5381 Other malaise: Secondary | ICD-10-CM

## 2014-06-13 DIAGNOSIS — R918 Other nonspecific abnormal finding of lung field: Secondary | ICD-10-CM

## 2014-06-13 DIAGNOSIS — I10 Essential (primary) hypertension: Secondary | ICD-10-CM

## 2014-06-13 DIAGNOSIS — E039 Hypothyroidism, unspecified: Secondary | ICD-10-CM

## 2014-06-13 DIAGNOSIS — R911 Solitary pulmonary nodule: Secondary | ICD-10-CM

## 2014-06-13 DIAGNOSIS — E785 Hyperlipidemia, unspecified: Secondary | ICD-10-CM

## 2014-06-13 DIAGNOSIS — Z0000 Encounter for general adult medical examination without abnormal findings: Secondary | ICD-10-CM

## 2014-06-13 DIAGNOSIS — Z79899 Other long term (current) drug therapy: Secondary | ICD-10-CM

## 2014-06-13 DIAGNOSIS — R5383 Other fatigue: Secondary | ICD-10-CM

## 2014-06-13 NOTE — Progress Notes (Signed)
Patient ID: Crystal Kidd, female   DOB: 19-Jan-1946, 68 y.o.   MRN: [id number]     Patient Active Problem List   Diagnosis Date Noted  . Medicare annual wellness visit, subsequent 06/14/2014  . Pain in joint, shoulder region 12/06/2013  . S/P hysterectomy with oophorectomy 12/06/2013  . Cataract 12/06/2013  . Transfusion history   . Abnormal CT scan of lung 08/24/2012  . New onset of headaches after age 49 08/22/2011  . Cervical disc disorder with radiculopathy of cervical region 05/21/2010  . OTHER DYSPHAGIA 05/31/2009  . VITAMIN D DEFICIENCY 05/16/2009  . Hypothyroidism 12/26/2007  . Hyperlipidemia LDL goal <100 12/26/2007  . Essential hypertension 12/26/2007  . GERD 12/26/2007  . DIVERTICULOSIS, COLON 12/26/2007  . VITILIGO 12/31/2006  . OSTEOPENIA 12/31/2006    Subjective:  CC:   Chief Complaint  Patient presents with  . Follow-up    Breast exam for Mammogram done in April. No longer has GYN and HX of breast cancer in family.    HPI:   Crystal Kidd is a 68 y.o. female who presents for  Follow up on chronic conditions including hypertension, hyperlipidemia and hypothyroidism.   She feels generally well and hs no new complaints.  She has been taking her medications as directed with  No side effects.  Exercising sveral days per week and watching her diet.   Past Medical History  Diagnosis Date  . Diverticulosis   . GERD (gastroesophageal reflux disease)     PMH of esophageal stricture  . Allergic rhinoconjunctivitis   . Hyperlipidemia   . Hypertension   . Thyroid disease     hypothyroidism  . Vitiligo   . Osteopenia     BMD @[facility]   . Transfusion history 1968    post partum    Past Surgical History  Procedure Laterality Date  . Rotator cuff repair      R shoulder  . Abdominal hysterectomy      for dysfunctional menses; USO with TAH  . Breast surgery      biopsy X 2  . Appendectomy    . Esophageal dilation      X2  . Colonoscopy      X2; Tics; Dr  Fuller Plan, GI       The following portions of the patient's history were reviewed and updated as appropriate: Allergies, current medications, and problem list.    Review of Systems:   Patient denies headache, fevers, malaise, unintentional weight loss, skin rash, eye pain, sinus congestion and sinus pain, sore throat, dysphagia,  hemoptysis , cough, dyspnea, wheezing, chest pain, palpitations, orthopnea, edema, abdominal pain, nausea, melena, diarrhea, constipation, flank pain, dysuria, hematuria, urinary  Frequency, nocturia, numbness, tingling, seizures,  Focal weakness, Loss of consciousness,  Tremor, insomnia, depression, anxiety, and suicidal ideation.     History   Social History  . Marital Status: Widowed    Spouse Name: N/A    Number of Children: N/A  . Years of Education: N/A   Occupational History  . Not on file.   Social History Main Topics  . Smoking status: Never Smoker   . Smokeless tobacco: Never Used  . Alcohol Use: Yes     Comment:  rare wine  . Drug Use: No  . Sexual Activity: Not on file   Other Topics Concern  . Not on file   Social History Narrative    Objective:  Filed Vitals:   06/13/14 0825  BP: 134/78  Pulse: 74  Temp: 98.3  F (36.8 C)  Resp: 14     General appearance: alert, cooperative and appears stated age Ears: normal TM's and external ear canals both ears Throat: lips, mucosa, and tongue normal; teeth and gums normal Neck: no adenopathy, no carotid bruit, supple, symmetrical, trachea midline and thyroid not enlarged, symmetric, no tenderness/mass/nodules Back: symmetric, no curvature. ROM normal. No CVA tenderness. Lungs: clear to auscultation bilaterally Heart: regular rate and rhythm, S1, S2 normal, no murmur, click, rub or gallop Abdomen: soft, non-tender; bowel sounds normal; no masses,  no organomegaly Pulses: 2+ and symmetric Skin: Skin color, texture, turgor normal. No rashes or lesions Lymph nodes: Cervical,  supraclavicular, and axillary nodes normal.  Assessment and Plan:  Hypothyroidism Thyroid function is WNL on current dose.  No current changes needed.   Lab Results  Component Value Date   TSH 3.470 06/13/2014     Essential hypertension Well controlled on current regimen. Renal function stable, no changes today.  Lab Results  Component Value Date   CREATININE 0.75 06/13/2014   Lab Results  Component Value Date   NA 140 06/13/2014   K 4.9 06/13/2014   CL 101 06/13/2014   CO2 27 06/13/2014     Hyperlipidemia LDL goal <100 LDL and triglycerides are at goal on current medications. He has no side effects and liver enzymes are normal. No changes today   Lab Results  Component Value Date   CHOL 159 09/02/2013   HDL 47 06/13/2014   LDLCALC 96 06/13/2014   LDLDIRECT 182.4 09/14/2010   TRIG 134 06/13/2014   CHOLHDL 3.6 06/13/2014   Lab Results  Component Value Date   ALT 8 06/13/2014   AST 13 06/13/2014   ALKPHOS 99 06/13/2014   BILITOT 0.4 06/13/2014      Updated Medication List Outpatient Encounter Prescriptions as of 06/13/2014  Medication Sig  . calcium carbonate (TUMS) 500 MG chewable tablet Chew 1 tablet by mouth as needed.   Marland Kitchen CALCIUM PO Take by mouth daily.    . cetirizine (ZYRTEC ALLERGY) 10 MG tablet Take 10 mg by mouth daily.    Marland Kitchen FIBER SELECT GUMMIES PO Take by mouth.    . fluticasone (FLONASE) 50 MCG/ACT nasal spray Place 2 sprays into both nostrils daily.  Marland Kitchen gabapentin (NEURONTIN) 100 MG capsule Take 1 capsule (100 mg total) by mouth 3 (three) times daily. Every 8 hours as needed  . hydrochlorothiazide (HYDRODIURIL) 25 MG tablet 1/2 tablet daily prn  . levothyroxine (SYNTHROID, LEVOTHROID) 50 MCG tablet Take 1 tablet (50 mcg total) by mouth as directed. 1 by mouth daily except 1 1/2 by mouth M,W,& F  . losartan (COZAAR) 50 MG tablet Take 1 tablet (50 mg total) by mouth daily. 1 tablets by mouth once daily  . Multiple Vitamin (MULTIVITAMIN) tablet  Take 1 tablet by mouth daily.    Marland Kitchen omeprazole (PRILOSEC) 40 MG capsule Take 1 capsule (40 mg total) by mouth daily.  . simvastatin (ZOCOR) 20 MG tablet Take 1 tablet (20 mg total) by mouth every evening.     Orders Placed This Encounter  Procedures  . CT Chest Wo Contrast  . Pneumococcal conjugate vaccine 13-valent  . Comprehensive metabolic panel  . TSH    No Follow-up on file.

## 2014-06-13 NOTE — Patient Instructions (Addendum)
Try Sudafed PE  10 mg every 6 to 8 hours for the congestion/ear issue you are experiencing.  If no improvement call for prednisone taper   We will try to get a breast MRI next year  When you are due for mammogram given your family history  Return in 6 months or in Appril for your annual wellness exam  We will call you with the labs  prevnar vaccine received today

## 2014-06-13 NOTE — Progress Notes (Signed)
Pre-visit discussion using our clinic review tool. No additional management support is needed unless otherwise documented below in the visit note.  

## 2014-06-14 DIAGNOSIS — Z0000 Encounter for general adult medical examination without abnormal findings: Secondary | ICD-10-CM | POA: Insufficient documentation

## 2014-06-14 LAB — CBC WITH DIFFERENTIAL/PLATELET
Basophils Absolute: 0.1 10*3/uL (ref 0.0–0.2)
Basos: 1 %
EOS ABS: 0.3 10*3/uL (ref 0.0–0.4)
Eos: 6 %
HCT: 42.1 % (ref 34.0–46.6)
Hemoglobin: 14.5 g/dL (ref 11.1–15.9)
IMMATURE GRANS (ABS): 0 10*3/uL (ref 0.0–0.1)
IMMATURE GRANULOCYTES: 0 %
Lymphocytes Absolute: 1.4 10*3/uL (ref 0.7–3.1)
Lymphs: 29 %
MCH: 29.6 pg (ref 26.6–33.0)
MCHC: 34.4 g/dL (ref 31.5–35.7)
MCV: 86 fL (ref 79–97)
MONOS ABS: 0.5 10*3/uL (ref 0.1–0.9)
Monocytes: 11 %
Neutrophils Absolute: 2.6 10*3/uL (ref 1.4–7.0)
Neutrophils Relative %: 53 %
RBC: 4.9 x10E6/uL (ref 3.77–5.28)
RDW: 13.5 % (ref 12.3–15.4)
WBC: 4.9 10*3/uL (ref 3.4–10.8)

## 2014-06-14 LAB — LIPID PANEL
CHOL/HDL RATIO: 3.6 ratio (ref 0.0–4.4)
Cholesterol, Total: 170 mg/dL (ref 100–199)
HDL: 47 mg/dL (ref >39)
LDL Calculated: 96 mg/dL (ref 0–99)
Triglycerides: 134 mg/dL (ref 0–149)
VLDL Cholesterol Cal: 27 mg/dL (ref 5–40)

## 2014-06-14 LAB — COMPREHENSIVE METABOLIC PANEL
A/G RATIO: 1.8 (ref 1.1–2.5)
ALT: 8 IU/L (ref 0–32)
AST: 13 IU/L (ref 0–40)
Albumin: 4.2 g/dL (ref 3.6–4.8)
Alkaline Phosphatase: 99 IU/L (ref 39–117)
BUN/Creatinine Ratio: 13 (ref 11–26)
BUN: 10 mg/dL (ref 8–27)
CO2: 27 mmol/L (ref 18–29)
Calcium: 9.3 mg/dL (ref 8.7–10.3)
Chloride: 101 mmol/L (ref 97–108)
Creatinine, Ser: 0.75 mg/dL (ref 0.57–1.00)
GFR calc Af Amer: 95 mL/min/{1.73_m2} (ref >59)
GFR, EST NON AFRICAN AMERICAN: 82 mL/min/{1.73_m2} (ref >59)
Globulin, Total: 2.3 g/dL (ref 1.5–4.5)
Glucose: 94 mg/dL (ref 65–99)
POTASSIUM: 4.9 mmol/L (ref 3.5–5.2)
SODIUM: 140 mmol/L (ref 134–144)
Total Bilirubin: 0.4 mg/dL (ref 0.0–1.2)
Total Protein: 6.5 g/dL (ref 6.0–8.5)

## 2014-06-14 LAB — TSH: TSH: 3.47 u[IU]/mL (ref 0.450–4.500)

## 2014-06-14 LAB — VITAMIN D 25 HYDROXY (VIT D DEFICIENCY, FRACTURES): Vit D, 25-Hydroxy: 33.3 ng/mL (ref 30.0–100.0)

## 2014-06-14 NOTE — Assessment & Plan Note (Signed)
Thyroid function is WNL on current dose.  No current changes needed.   Lab Results  Component Value Date   TSH 3.470 06/13/2014

## 2014-06-14 NOTE — Assessment & Plan Note (Signed)
LDL and triglycerides are at goal on current medications. He has no side effects and liver enzymes are normal. No changes today   Lab Results  Component Value Date   CHOL 159 09/02/2013   HDL 47 06/13/2014   LDLCALC 96 06/13/2014   LDLDIRECT 182.4 09/14/2010   TRIG 134 06/13/2014   CHOLHDL 3.6 06/13/2014   Lab Results  Component Value Date   ALT 8 06/13/2014   AST 13 06/13/2014   ALKPHOS 99 06/13/2014   BILITOT 0.4 06/13/2014

## 2014-06-14 NOTE — Assessment & Plan Note (Signed)
Well controlled on current regimen. Renal function stable, no changes today.  Lab Results  Component Value Date   CREATININE 0.75 06/13/2014   Lab Results  Component Value Date   NA 140 06/13/2014   K 4.9 06/13/2014   CL 101 06/13/2014   CO2 27 06/13/2014

## 2014-06-14 NOTE — Assessment & Plan Note (Deleted)

## 2014-06-15 ENCOUNTER — Other Ambulatory Visit: Payer: Self-pay | Admitting: Internal Medicine

## 2014-06-17 ENCOUNTER — Encounter: Payer: Self-pay | Admitting: Internal Medicine

## 2014-06-20 ENCOUNTER — Ambulatory Visit: Payer: Self-pay | Admitting: Internal Medicine

## 2014-06-21 ENCOUNTER — Telehealth: Payer: Self-pay | Admitting: Internal Medicine

## 2014-06-21 DIAGNOSIS — R918 Other nonspecific abnormal finding of lung field: Secondary | ICD-10-CM

## 2014-06-21 NOTE — Telephone Encounter (Signed)
Patient called checking on results of CT

## 2014-06-21 NOTE — Telephone Encounter (Signed)
The previously seen pulmonary nodules are unchanged from 2012 and are considered benign, therefore no further follow up Cts are needed

## 2014-06-21 NOTE — Assessment & Plan Note (Signed)
The previously seen pulmonary nodules are unchanged from 2012 and are considered benign, therefore no further follow up Cts are needed

## 2014-06-22 NOTE — Telephone Encounter (Signed)
Patient notified of results and voiced understanding. 

## 2014-06-28 ENCOUNTER — Other Ambulatory Visit: Payer: Self-pay | Admitting: Internal Medicine

## 2014-07-12 ENCOUNTER — Encounter: Payer: Self-pay | Admitting: Internal Medicine

## 2014-07-25 ENCOUNTER — Other Ambulatory Visit: Payer: Self-pay | Admitting: Internal Medicine

## 2014-08-16 ENCOUNTER — Other Ambulatory Visit: Payer: Self-pay | Admitting: Internal Medicine

## 2014-09-30 ENCOUNTER — Other Ambulatory Visit: Payer: Self-pay | Admitting: Internal Medicine

## 2014-10-21 ENCOUNTER — Other Ambulatory Visit: Payer: Self-pay | Admitting: *Deleted

## 2014-10-21 MED ORDER — LEVOTHYROXINE SODIUM 50 MCG PO TABS: ORAL_TABLET | ORAL | Status: DC

## 2014-11-07 ENCOUNTER — Other Ambulatory Visit: Payer: Self-pay | Admitting: Internal Medicine

## 2014-11-09 ENCOUNTER — Other Ambulatory Visit: Payer: Self-pay | Admitting: Internal Medicine

## 2014-11-14 ENCOUNTER — Encounter: Payer: Commercial Managed Care - HMO | Admitting: Internal Medicine

## 2014-11-18 ENCOUNTER — Encounter: Payer: Self-pay | Admitting: Internal Medicine

## 2014-11-18 ENCOUNTER — Other Ambulatory Visit (INDEPENDENT_AMBULATORY_CARE_PROVIDER_SITE_OTHER): Payer: Commercial Managed Care - HMO

## 2014-11-18 DIAGNOSIS — E038 Other specified hypothyroidism: Secondary | ICD-10-CM

## 2014-11-18 DIAGNOSIS — E034 Atrophy of thyroid (acquired): Secondary | ICD-10-CM

## 2014-11-18 LAB — TSH: TSH: 3.57 u[IU]/mL (ref 0.35–4.50)

## 2014-11-21 ENCOUNTER — Encounter: Payer: Self-pay | Admitting: Internal Medicine

## 2014-11-21 ENCOUNTER — Ambulatory Visit (INDEPENDENT_AMBULATORY_CARE_PROVIDER_SITE_OTHER): Payer: Commercial Managed Care - HMO | Admitting: Internal Medicine

## 2014-11-21 VITALS — BP 130/82 | HR 83 | Temp 98.4°F | Resp 16 | Ht 62.5 in | Wt 138.2 lb

## 2014-11-21 DIAGNOSIS — H269 Unspecified cataract: Secondary | ICD-10-CM

## 2014-11-21 DIAGNOSIS — Z1211 Encounter for screening for malignant neoplasm of colon: Secondary | ICD-10-CM

## 2014-11-21 DIAGNOSIS — I10 Essential (primary) hypertension: Secondary | ICD-10-CM | POA: Diagnosis not present

## 2014-11-21 DIAGNOSIS — E785 Hyperlipidemia, unspecified: Secondary | ICD-10-CM | POA: Diagnosis not present

## 2014-11-21 DIAGNOSIS — Z79899 Other long term (current) drug therapy: Secondary | ICD-10-CM

## 2014-11-21 DIAGNOSIS — Z0000 Encounter for general adult medical examination without abnormal findings: Secondary | ICD-10-CM | POA: Diagnosis not present

## 2014-11-21 DIAGNOSIS — R918 Other nonspecific abnormal finding of lung field: Secondary | ICD-10-CM

## 2014-11-21 NOTE — Patient Instructions (Signed)
Referral to [facility] is in process  Return in 6 months for repeat thyroid check  Please get your BP repeated at the pharmacy and let me know what the readings are   Health Maintenance Adopting a healthy lifestyle and getting preventive care can go a long way to promote health and wellness. Talk with your health care provider about what schedule of regular examinations is right for you. This is a good chance for you to check in with your provider about disease prevention and staying healthy. In between checkups, there are plenty of things you can do on your own. Experts have done a lot of research about which lifestyle changes and preventive measures are most likely to keep you healthy. Ask your health care provider for more information. WEIGHT AND DIET  Eat a healthy diet  Be sure to include plenty of vegetables, fruits, low-fat dairy products, and lean protein.  Do not eat a lot of foods high in solid fats, added sugars, or salt.  Get regular exercise. This is one of the most important things you can do for your health.  Most adults should exercise for at least 150 minutes each week. The exercise should increase your heart rate and make you sweat (moderate-intensity exercise).  Most adults should also do strengthening exercises at least twice a week. This is in addition to the moderate-intensity exercise.  Maintain a healthy weight  Body mass index (BMI) is a measurement that can be used to identify possible weight problems. It estimates body fat based on height and weight. Your health care provider can help determine your BMI and help you achieve or maintain a healthy weight.  For females 51 years of age and older:   A BMI below 18.5 is considered underweight.  A BMI of 18.5 to 24.9 is normal.  A BMI of 25 to 29.9 is considered overweight.  A BMI of 30 and above is considered obese.  Watch levels of cholesterol and blood lipids  You should start having your blood tested for  lipids and cholesterol at 70 years of age, then have this test every 5 years.  You may need to have your cholesterol levels checked more often if:  Your lipid or cholesterol levels are high.  You are older than 70 years of age.  You are at high risk for heart disease.  CANCER SCREENING   Lung Cancer  Lung cancer screening is recommended for adults 70-33 years old who are at high risk for lung cancer because of a history of smoking.  A yearly low-dose CT scan of the lungs is recommended for people who:  Currently smoke.  Have quit within the past 15 years.  Have at least a 30-pack-year history of smoking. A pack year is smoking an average of one pack of cigarettes a day for 1 year.  Yearly screening should continue until it has been 15 years since you quit.  Yearly screening should stop if you develop a health problem that would prevent you from having lung cancer treatment.  Breast Cancer  Practice breast self-awareness. This means understanding how your breasts normally appear and feel.  It also means doing regular breast self-exams. Let your health care provider know about any changes, no matter how small.  If you are in your 20s or 30s, you should have a clinical breast exam (CBE) by a health care provider every 1-3 years as part of a regular health exam.  If you are 74 or older, have a  CBE every year. Also consider having a breast X-ray (mammogram) every year.  If you have a family history of breast cancer, talk to your health care provider about genetic screening.  If you are at high risk for breast cancer, talk to your health care provider about having an MRI and a mammogram every year.  Breast cancer gene (BRCA) assessment is recommended for women who have family members with BRCA-related cancers. BRCA-related cancers include:  Breast.  Ovarian.  Tubal.  Peritoneal cancers.  Results of the assessment will determine the need for genetic counseling and BRCA1  and BRCA2 testing. Cervical Cancer Routine pelvic examinations to screen for cervical cancer are no longer recommended for nonpregnant women who are considered low risk for cancer of the pelvic organs (ovaries, uterus, and vagina) and who do not have symptoms. A pelvic examination may be necessary if you have symptoms including those associated with pelvic infections. Ask your health care provider if a screening pelvic exam is right for you.   The Pap test is the screening test for cervical cancer for women who are considered at risk.  If you had a hysterectomy for a problem that was not cancer or a condition that could lead to cancer, then you no longer need Pap tests.  If you are older than 65 years, and you have had normal Pap tests for the past 10 years, you no longer need to have Pap tests.  If you have had past treatment for cervical cancer or a condition that could lead to cancer, you need Pap tests and screening for cancer for at least 20 years after your treatment.  If you no longer get a Pap test, assess your risk factors if they change (such as having a new sexual partner). This can affect whether you should start being screened again.  Some women have medical problems that increase their chance of getting cervical cancer. If this is the case for you, your health care provider may recommend more frequent screening and Pap tests.  The human papillomavirus (HPV) test is another test that may be used for cervical cancer screening. The HPV test looks for the virus that can cause cell changes in the cervix. The cells collected during the Pap test can be tested for HPV.  The HPV test can be used to screen women 45 years of age and older. Getting tested for HPV can extend the interval between normal Pap tests from three to five years.  An HPV test also should be used to screen women of any age who have unclear Pap test results.  After 70 years of age, women should have HPV testing as often  as Pap tests.  Colorectal Cancer  This type of cancer can be detected and often prevented.  Routine colorectal cancer screening usually begins at 70 years of age and continues through 70 years of age.  Your health care provider may recommend screening at an earlier age if you have risk factors for colon cancer.  Your health care provider may also recommend using home test kits to check for hidden blood in the stool.  A small camera at the end of a tube can be used to examine your colon directly (sigmoidoscopy or colonoscopy). This is done to check for the earliest forms of colorectal cancer.  Routine screening usually begins at age 3.  Direct examination of the colon should be repeated every 5-10 years through 70 years of age. However, you may need to be screened more often  if early forms of precancerous polyps or small growths are found. Skin Cancer  Check your skin from head to toe regularly.  Tell your health care provider about any new moles or changes in moles, especially if there is a change in a mole's shape or color.  Also tell your health care provider if you have a mole that is larger than the size of a pencil eraser.  Always use sunscreen. Apply sunscreen liberally and repeatedly throughout the day.  Protect yourself by wearing long sleeves, pants, a wide-brimmed hat, and sunglasses whenever you are outside. HEART DISEASE, DIABETES, AND HIGH BLOOD PRESSURE   Have your blood pressure checked at least every 1-2 years. High blood pressure causes heart disease and increases the risk of stroke.  If you are between 51 years and 67 years old, ask your health care provider if you should take aspirin to prevent strokes.  Have regular diabetes screenings. This involves taking a blood sample to check your fasting blood sugar level.  If you are at a normal weight and have a low risk for diabetes, have this test once every three years after 70 years of age.  If you are overweight  and have a high risk for diabetes, consider being tested at a younger age or more often. PREVENTING INFECTION  Hepatitis B  If you have a higher risk for hepatitis B, you should be screened for this virus. You are considered at high risk for hepatitis B if:  You were born in a country where hepatitis B is common. Ask your health care provider which countries are considered high risk.  Your parents were born in a high-risk country, and you have not been immunized against hepatitis B (hepatitis B vaccine).  You have HIV or AIDS.  You use needles to inject street drugs.  You live with someone who has hepatitis B.  You have had sex with someone who has hepatitis B.  You get hemodialysis treatment.  You take certain medicines for conditions, including cancer, organ transplantation, and autoimmune conditions. Hepatitis C  Blood testing is recommended for:  Everyone born from [birth year] through 1966.  Anyone with known risk factors for hepatitis C. Sexually transmitted infections (STIs)  You should be screened for sexually transmitted infections (STIs) including gonorrhea and chlamydia if:  You are sexually active and are younger than 70 years of age.  You are older than 70 years of age and your health care provider tells you that you are at risk for this type of infection.  Your sexual activity has changed since you were last screened and you are at an increased risk for chlamydia or gonorrhea. Ask your health care provider if you are at risk.  If you do not have HIV, but are at risk, it may be recommended that you take a prescription medicine daily to prevent HIV infection. This is called pre-exposure prophylaxis (PrEP). You are considered at risk if:  You are sexually active and do not regularly use condoms or know the HIV status of your partner(s).  You take drugs by injection.  You are sexually active with a partner who has HIV. Talk with your health care provider about whether  you are at high risk of being infected with HIV. If you choose to begin PrEP, you should first be tested for HIV. You should then be tested every 3 months for as long as you are taking PrEP.  PREGNANCY   If you are premenopausal and you may become pregnant,  ask your health care provider about preconception counseling.  If you may become pregnant, take 400 to 800 micrograms (mcg) of folic acid every day.  If you want to prevent pregnancy, talk to your health care provider about birth control (contraception). OSTEOPOROSIS AND MENOPAUSE   Osteoporosis is a disease in which the bones lose minerals and strength with aging. This can result in serious bone fractures. Your risk for osteoporosis can be identified using a bone density scan.  If you are 71 years of age or older, or if you are at risk for osteoporosis and fractures, ask your health care provider if you should be screened.  Ask your health care provider whether you should take a calcium or vitamin D supplement to lower your risk for osteoporosis.  Menopause may have certain physical symptoms and risks.  Hormone replacement therapy may reduce some of these symptoms and risks. Talk to your health care provider about whether hormone replacement therapy is right for you.  HOME CARE INSTRUCTIONS   Schedule regular health, dental, and eye exams.  Stay current with your immunizations.   Do not use any tobacco products including cigarettes, chewing tobacco, or electronic cigarettes.  If you are pregnant, do not drink alcohol.  If you are breastfeeding, limit how much and how often you drink alcohol.  Limit alcohol intake to no more than 1 drink per day for nonpregnant women. One drink equals 12 ounces of beer, 5 ounces of wine, or 1 ounces of hard liquor.  Do not use street drugs.  Do not share needles.  Ask your health care provider for help if you need support or information about quitting drugs.  Tell your health care  provider if you often feel depressed.  Tell your health care provider if you have ever been abused or do not feel safe at home. Document Released: 02/11/2011 Document Revised: 12/13/2013 Document Reviewed: 06/30/2013 [facility] Patient Information 2015 [place], [place]. This information is not intended to replace advice given to you by your health care provider. Make sure you discuss any questions you have with your health care provider.

## 2014-11-21 NOTE — Progress Notes (Signed)
Patient ID: Crystal Kidd, female   DOB: 1946/05/07, 69 y.o.   MRN: [id number]   The patient is here for annual Medicare wellness examination and management of other chronic and acute problems.  Annal wellness exam.  mammogram scheduled for tomorrow,  She has lost 8 lbs since last  wellness exam one year ago .  Working out 3 times week.   Home bps usually better,  Gets elevated with doctor's office and worried  About her granddaughter who is having uncontrolled seizures  Bowels moving regularly but  Having trouble finishing bowel evacuation  Has reduced fiber supplements recently, may be the cause  BP at home 170/70 to 140/80.  Gets elevated in doctor's office.   colonoscopy normal 2007,  Due to [place] was supposed to repeat in 5 years but dr stark offered her the alternative of annual FOBTs .   Has not done one since 2014     The risk factors are reflected in the social history.  The roster of all physicians providing medical care to patient - is listed in the Snapshot section of the chart.  Activities of daily living:  The patient is 100% independent in all ADLs: dressing, toileting, feeding as well as independent mobility  Home safety : The patient has smoke detectors in the home. They wear seatbelts.  There are no firearms at home. There is no violence in the home.   There is no risks for hepatitis, STDs or HIV. There is no   history of blood transfusion. They have no travel history to infectious disease endemic areas of the world.  The patient has seen their dentist in the last six month. They have seen their eye doctor in the last year. They admit to slight hearing difficulty with regard to whispered voices and some television programs.  They have deferred audiologic testing in the last year.  They do not  have excessive sun exposure. Discussed the need for sun protection: hats, long sleeves and use of sunscreen if there is significant sun exposure.   Diet: the importance of a healthy diet  is discussed. They do have a healthy diet.  The benefits of regular aerobic exercise were discussed. She walks 4 times per week ,  20 minutes.   Depression screen: there are no signs or vegative symptoms of depression- irritability, change in appetite, anhedonia, sadness/tearfullness  Cognitive assessment: the patient manages all their financial and personal affairs and is actively engaged. They could relate day,date,year and events; recalled 2/3 objects at 3 minutes; performed clock-face test normally.  The following portions of the patient's history were reviewed and updated as appropriate: allergies, current medications, past family history, past medical history,  past surgical history, past social history  and problem list.  Visual acuity was not assessed per patient preference since she has regular follow up with her ophthalmologist. Hearing and body mass index were assessed and reviewed.   During the course of the visit the patient was educated and counseled about appropriate screening and preventive services including : fall prevention , diabetes screening, nutrition counseling, colorectal cancer screening, and recommended immunizations.   Review of Systems:  Patient denies headache, fevers, malaise, unintentional weight loss, skin rash, eye pain, sinus congestion and sinus pain, sore throat, dysphagia,  hemoptysis , cough, dyspnea, wheezing, chest pain, palpitations, orthopnea, edema, abdominal pain, nausea, melena, diarrhea, constipation, flank pain, dysuria, hematuria, urinary  Frequency, nocturia, numbness, tingling, seizures,  Focal weakness, Loss of consciousness,  Tremor, insomnia, depression, anxiety, and  suicidal ideation.    Objective:  BP 130/82 mmHg  Pulse 83  Temp(Src) 98.4 F (36.9 C) (Oral)  Resp 16  Ht 5' 2.5" (1.588 m)  Wt 138 lb 4 oz (62.71 kg)  BMI 24.87 kg/m2  SpO2 97%  General appearance: alert, cooperative and appears stated age Head: Normocephalic, without  obvious abnormality, atraumatic Eyes: conjunctivae/corneas clear. PERRL, EOM's intact. Fundi benign. Ears: normal TM's and external ear canals both ears Nose: Nares normal. Septum midline. Mucosa normal. No drainage or sinus tenderness. Throat: lips, mucosa, and tongue normal; teeth and gums normal Neck: no adenopathy, no carotid bruit, no JVD, supple, symmetrical, trachea midline and thyroid not enlarged, symmetric, no tenderness/mass/nodules Lungs: clear to auscultation bilaterally Breasts: normal appearance, no masses or tenderness Heart: regular rate and rhythm, S1, S2 normal, no murmur, click, rub or gallop Abdomen: soft, non-tender; bowel sounds normal; no masses,  no organomegaly Extremities: extremities normal, atraumatic, no cyanosis or edema Pulses: 2+ and symmetric Skin: Skin color, texture, turgor normal. No rashes or lesions Neurologic: Alert and oriented X 3, normal strength and tone. Normal symmetric reflexes. Normal coordination and gait.    Assessment and Plan:  Problem List Items Addressed This Visit      Unprioritized   Hyperlipidemia LDL goal <100    LDL and triglycerides have been at goal on current medications. She  has no side effects and liver enzymes are normal. No changes today.and will return for fasting lipids.  Lab Results  Component Value Date   CHOL 170 06/13/2014   HDL 47 06/13/2014   LDLCALC 96 06/13/2014   LDLDIRECT 182.4 09/14/2010   TRIG 134 06/13/2014   CHOLHDL 3.6 06/13/2014         Essential hypertension    Elevated today.  Reviewed list of meds, patient is not taking OTC meds that could be causing,. It.  Have asked patient to recheck bp at home a minimum of 5 times over the next 4 weeks and call readings to office for adjustment of medications.        Abnormal CT scan of lung    The previously seen pulmonary nodules are unchanged by Nov 2015 CT and are considered benign, therefore no further follow up Cts are needed         Cataract - Primary   Relevant Orders   Ambulatory referral to Ophthalmology   Medicare annual wellness visit, subsequent    Annual Medicare wellness  exam was done as well as a comprehensive physical exam and management of acute and chronic conditions .  During the course of the visit the patient was educated and counseled about appropriate screening and preventive services including : fall prevention , diabetes screening, nutrition counseling, colorectal cancer screening, and recommended immunizations.  Printed recommendations for health maintenance screenings was given.        Other Visit Diagnoses    Long-term use of high-risk medication        Relevant Orders    Comprehensive metabolic panel (Completed)    Screening for colon cancer        Relevant Orders    Fecal occult blood, imunochemical

## 2014-11-21 NOTE — Progress Notes (Signed)
Pre-visit discussion using our clinic review tool. No additional management support is needed unless otherwise documented below in the visit note.  

## 2014-11-22 DIAGNOSIS — Z1231 Encounter for screening mammogram for malignant neoplasm of breast: Secondary | ICD-10-CM | POA: Diagnosis not present

## 2014-11-22 DIAGNOSIS — Z803 Family history of malignant neoplasm of breast: Secondary | ICD-10-CM | POA: Diagnosis not present

## 2014-11-22 LAB — COMPREHENSIVE METABOLIC PANEL
ALT: 14 U/L (ref 0–35)
AST: 16 U/L (ref 0–37)
Albumin: 3.9 g/dL (ref 3.5–5.2)
Alkaline Phosphatase: 95 U/L (ref 39–117)
BILIRUBIN TOTAL: 0.3 mg/dL (ref 0.2–1.2)
BUN: 11 mg/dL (ref 6–23)
CO2: 30 meq/L (ref 19–32)
CREATININE: 0.73 mg/dL (ref 0.40–1.20)
Calcium: 9.5 mg/dL (ref 8.4–10.5)
Chloride: 106 mEq/L (ref 96–112)
GFR: 83.91 mL/min (ref >60.00)
GLUCOSE: 103 mg/dL — AB (ref 70–99)
Potassium: 4.2 mEq/L (ref 3.5–5.1)
Sodium: 139 mEq/L (ref 135–145)
Total Protein: 6.7 g/dL (ref 6.0–8.3)

## 2014-11-22 NOTE — Assessment & Plan Note (Signed)
LDL and triglycerides have been at goal on current medications. She  has no side effects and liver enzymes are normal. No changes today.and will return for fasting lipids.  Lab Results  Component Value Date   CHOL 170 06/13/2014   HDL 47 06/13/2014   LDLCALC 96 06/13/2014   LDLDIRECT 182.4 09/14/2010   TRIG 134 06/13/2014   CHOLHDL 3.6 06/13/2014

## 2014-11-22 NOTE — Assessment & Plan Note (Signed)
Elevated today.  Reviewed list of meds, patient is not taking OTC meds that could be causing,. It.  Have asked patient to recheck bp at home a minimum of 5 times over the next 4 weeks and call readings to office for adjustment of medications.   

## 2014-11-22 NOTE — Assessment & Plan Note (Signed)

## 2014-11-22 NOTE — Assessment & Plan Note (Signed)
The previously seen pulmonary nodules are unchanged by Nov 2015 CT and are considered benign, therefore no further follow up Cts are needed

## 2014-11-24 ENCOUNTER — Telehealth: Payer: Self-pay

## 2014-11-24 DIAGNOSIS — G4452 New daily persistent headache (NDPH): Secondary | ICD-10-CM

## 2014-11-24 MED ORDER — GABAPENTIN 100 MG PO CAPS: 100.0000 mg | ORAL_CAPSULE | Freq: Three times a day (TID) | ORAL | Status: DC

## 2014-11-24 NOTE — Telephone Encounter (Signed)
The pt called and is hoping to get a refill on her gabapentin medication [facility]acy - [facility] [address]  Pt callback - [phone]

## 2014-11-24 NOTE — Telephone Encounter (Signed)
Script sent electronically 

## 2014-11-24 NOTE — Telephone Encounter (Signed)
OK to refill Gabapentin please advise filled by historical provider.

## 2014-11-24 NOTE — Telephone Encounter (Signed)
Ok to refill,  Refill sent  

## 2014-11-25 ENCOUNTER — Encounter: Payer: Self-pay | Admitting: Internal Medicine

## 2014-11-25 DIAGNOSIS — I10 Essential (primary) hypertension: Secondary | ICD-10-CM

## 2014-11-28 NOTE — Assessment & Plan Note (Signed)
REPEAT BP AT [facility] WAS 116/66  PER E MAIL

## 2014-11-29 ENCOUNTER — Other Ambulatory Visit (INDEPENDENT_AMBULATORY_CARE_PROVIDER_SITE_OTHER): Payer: Commercial Managed Care - HMO

## 2014-11-29 DIAGNOSIS — Z1211 Encounter for screening for malignant neoplasm of colon: Secondary | ICD-10-CM | POA: Diagnosis not present

## 2014-11-29 LAB — FECAL OCCULT BLOOD, IMMUNOCHEMICAL: Fecal Occult Bld: NEGATIVE

## 2014-12-01 ENCOUNTER — Encounter: Payer: Self-pay | Admitting: Internal Medicine

## 2014-12-09 LAB — HM MAMMOGRAPHY: HM Mammogram: NORMAL

## 2014-12-16 ENCOUNTER — Encounter: Payer: Self-pay | Admitting: Internal Medicine

## 2014-12-19 ENCOUNTER — Telehealth: Payer: Self-pay | Admitting: *Deleted

## 2014-12-19 ENCOUNTER — Telehealth: Payer: Self-pay | Admitting: Internal Medicine

## 2014-12-19 NOTE — Telephone Encounter (Signed)
After taking the first IFOB with the one stool card patient is concerned that insurance may pay for a second. The second being the 3 card test. Please advise.

## 2014-12-19 NOTE — Telephone Encounter (Signed)
They will .  I will code for it so they do

## 2014-12-20 DIAGNOSIS — Z961 Presence of intraocular lens: Secondary | ICD-10-CM | POA: Diagnosis not present

## 2014-12-20 DIAGNOSIS — H26493 Other secondary cataract, bilateral: Secondary | ICD-10-CM | POA: Diagnosis not present

## 2014-12-20 DIAGNOSIS — H04123 Dry eye syndrome of bilateral lacrimal glands: Secondary | ICD-10-CM | POA: Diagnosis not present

## 2014-12-20 NOTE — Telephone Encounter (Signed)
Patient notified

## 2014-12-20 NOTE — Telephone Encounter (Signed)
Left message for patient to call office.  

## 2014-12-23 ENCOUNTER — Other Ambulatory Visit (INDEPENDENT_AMBULATORY_CARE_PROVIDER_SITE_OTHER): Payer: Commercial Managed Care - HMO

## 2014-12-23 ENCOUNTER — Other Ambulatory Visit: Payer: Self-pay | Admitting: Internal Medicine

## 2014-12-23 DIAGNOSIS — Z1211 Encounter for screening for malignant neoplasm of colon: Secondary | ICD-10-CM

## 2014-12-23 LAB — FECAL OCCULT BLOOD, IMMUNOCHEMICAL: Fecal Occult Bld: NEGATIVE

## 2014-12-24 ENCOUNTER — Encounter: Payer: Self-pay | Admitting: Internal Medicine

## 2015-03-21 ENCOUNTER — Encounter: Payer: Self-pay | Admitting: Gastroenterology

## 2015-03-23 ENCOUNTER — Telehealth: Payer: Self-pay | Admitting: *Deleted

## 2015-03-23 NOTE — Telephone Encounter (Signed)
Patient has questions regarding her bill & billing told her to contact our office. Pt states that she had a wellness visit on 11/21/14 & apparently something was discussed that was not part of her wellness. Her statement shows a consultation fee. Please contact patient

## 2015-03-24 NOTE — Telephone Encounter (Signed)
Sent information to charge correction waiting on a response.

## 2015-03-31 ENCOUNTER — Other Ambulatory Visit: Payer: Self-pay | Admitting: Internal Medicine

## 2015-04-02 ENCOUNTER — Encounter: Payer: Self-pay | Admitting: Internal Medicine

## 2015-04-04 ENCOUNTER — Encounter: Payer: Self-pay | Admitting: Internal Medicine

## 2015-04-05 ENCOUNTER — Other Ambulatory Visit: Payer: Self-pay | Admitting: Internal Medicine

## 2015-04-05 DIAGNOSIS — I10 Essential (primary) hypertension: Secondary | ICD-10-CM

## 2015-04-05 MED ORDER — LOSARTAN POTASSIUM 50 MG PO TABS: 75.0000 mg | ORAL_TABLET | Freq: Every day | ORAL | Status: DC

## 2015-04-05 MED ORDER — HYDROCHLOROTHIAZIDE 25 MG PO TABS: 25.0000 mg | ORAL_TABLET | Freq: Every day | ORAL | Status: DC

## 2015-04-07 ENCOUNTER — Other Ambulatory Visit: Payer: Self-pay | Admitting: *Deleted

## 2015-04-07 DIAGNOSIS — I10 Essential (primary) hypertension: Secondary | ICD-10-CM

## 2015-04-07 MED ORDER — HYDROCHLOROTHIAZIDE 25 MG PO TABS: ORAL_TABLET | ORAL | Status: DC

## 2015-04-12 ENCOUNTER — Ambulatory Visit (INDEPENDENT_AMBULATORY_CARE_PROVIDER_SITE_OTHER): Payer: Commercial Managed Care - HMO | Admitting: Family Medicine

## 2015-04-12 ENCOUNTER — Encounter: Payer: Self-pay | Admitting: Family Medicine

## 2015-04-12 ENCOUNTER — Telehealth: Payer: Self-pay | Admitting: *Deleted

## 2015-04-12 VITALS — BP 138/84 | HR 84 | Temp 98.0°F | Ht 62.5 in | Wt 140.4 lb

## 2015-04-12 DIAGNOSIS — H00016 Hordeolum externum left eye, unspecified eyelid: Secondary | ICD-10-CM | POA: Diagnosis not present

## 2015-04-12 DIAGNOSIS — M6248 Contracture of muscle, other site: Secondary | ICD-10-CM | POA: Diagnosis not present

## 2015-04-12 DIAGNOSIS — M62838 Other muscle spasm: Secondary | ICD-10-CM

## 2015-04-12 MED ORDER — BACLOFEN 10 MG PO TABS: 5.0000 mg | ORAL_TABLET | Freq: Three times a day (TID) | ORAL | Status: DC | PRN

## 2015-04-12 NOTE — Progress Notes (Signed)
Pre visit review using our clinic review tool, if applicable. No additional management support is needed unless otherwise documented below in the visit note. 

## 2015-04-12 NOTE — Telephone Encounter (Signed)
Pt called states the directions on the Baclofen bottle states not to stop suddenly.  Pt requests clarification.  She says she was not planning on taking the medication for an extended period of time.  Please advise

## 2015-04-12 NOTE — Patient Instructions (Signed)
Nice to meet you. You likely have a muscle spasm in your neck. We will treat this with a muscle relaxer.  If you develop fever, numbness, weakness, worsening pain, loss of bowel or bladder function, or tingling please seek medical attention.  Please apply warm compresses to your left eye to help with the stye. If this enlarges, becomes painful, or red, or if you have vision changes please seek medical attention.

## 2015-04-12 NOTE — Progress Notes (Signed)
Patient ID: Crystal Kidd, female   DOB: 1946/03/24, 69 y.o.   MRN: [id number]  Crystal Rumps, MD Phone: [phone]  Crystal Kidd is a 69 y.o. female who presents today for same day appointment.   Neck pain: patient notes her left neck has been painful and tight for the past 3 weeks. Notes it feels like a pinching sensation and spasm in left neck and shoulder. She denies numbness, weakness, radiation of pain down arms, and fever. Notes she took gabapentin with no benefit. She tried aleve, though this made her blood pressure go up. Has history of cervical spine issues in the past. Did note one episode of tinnitus 2 weeks ago, though no ear pain or fullness or decreased hearing. No recurrence of this.   Left lower eyelid lesion: notes presence of a nodule for the past 1.5 weeks. Notes initially the area looked bruised, though this has improved. It is on the edge of the eyelid. No pain or redness. No drainage. Initially it had a point on the inner portion of her lid that is no longer there. No vision changes. Used OTC stye treatment for this. No warm compresses.   PMH: nonsmoker.   ROS see HPI  Objective  Physical Exam Filed Vitals:   04/12/15 0934  BP: 138/84  Pulse: 84  Temp: 98 F (36.7 C)    BP Readings from Last 3 Encounters:  04/12/15 138/84  11/21/14 130/82  06/13/14 134/78   Wt Readings from Last 3 Encounters:  04/12/15 140 lb 6.4 oz (63.685 kg)  11/21/14 138 lb 4 oz (62.71 kg)  06/13/14 142 lb 12 oz (64.751 kg)    Physical Exam  Constitutional: She is well-developed, well-nourished, and in no distress.  HENT:  Head: Normocephalic and atraumatic.  Right Ear: External ear normal.  Left Ear: External ear normal.  Mouth/Throat: Oropharynx is clear and moist.  Eyes: Conjunctivae are normal. Pupils are equal, round, and reactive to light. Right eye exhibits no discharge. Left eye exhibits no discharge.  Left lower lateral edge of eyelid with small 2-3 mm nodule  noted, no erythema, no tenderness, no conjunctival irritation  Neck: Neck supple.  Cardiovascular: Normal rate, regular rhythm and normal heart sounds.  Exam reveals no gallop and no friction rub.   No murmur heard. Pulmonary/Chest: Effort normal and breath sounds normal. No respiratory distress. She has no wheezes. She has no rales.  Musculoskeletal:  Left lateral and proximal portion of the trapezius with spasm and tenderness, no midline spine tenderness, no swelling or erythema, full neck ROM  Lymphadenopathy:    She has no cervical adenopathy.  Neurological: She is alert.  CN 2-12 intact, 5/5 strength in bilateral biceps, triceps, grip, quads, hamstrings, plantar and dorsiflexion, sensation to light touch intact in bilateral UE and LE, normal gait, 2+ patellar and brachioradialis reflexes  Skin: Skin is warm and dry. She is not diaphoretic.     Assessment/Plan: Please see individual problem list.  Trapezius muscle spasm Exam consistent with left trapezius spasm. No neurological deficits. Afebrile and appears well. Will treat with baclofen for spasm. Given return precautions.   Stye Nodule is likely a stye or chalazion. Advised on warm compresses and continued monitoring. If not improving with this will need to consider referral to optho for further evaluation. Given return precautions.     Meds ordered this encounter  Medications  . DISCONTD: baclofen (LIORESAL) 10 MG tablet    Sig: Take 0.5 tablets (5 mg total) by mouth 3 (  three) times daily as needed for muscle spasms.    Dispense:  30 each    Refill:  0  . baclofen (LIORESAL) 10 MG tablet    Sig: Take 0.5 tablets (5 mg total) by mouth 3 (three) times daily as needed for muscle spasms.    Dispense:  30 each    Refill:  0    Crystal Kidd

## 2015-04-13 DIAGNOSIS — M62838 Other muscle spasm: Secondary | ICD-10-CM | POA: Insufficient documentation

## 2015-04-13 DIAGNOSIS — H00019 Hordeolum externum unspecified eye, unspecified eyelid: Secondary | ICD-10-CM | POA: Insufficient documentation

## 2015-04-13 HISTORY — DX: Other muscle spasm: M62.838

## 2015-04-13 NOTE — Assessment & Plan Note (Signed)
Exam consistent with left trapezius spasm. No neurological deficits. Afebrile and appears well. Will treat with baclofen for spasm. Given return precautions.

## 2015-04-13 NOTE — Telephone Encounter (Signed)
There should be no issue with short term use at a low dosage. If she ends up taking this long term or needing higher doses we will need to discuss tapering her off of this medication. Thanks.

## 2015-04-13 NOTE — Assessment & Plan Note (Signed)
Nodule is likely a stye or chalazion. Advised on warm compresses and continued monitoring. If not improving with this will need to consider referral to optho for further evaluation. Given return precautions.

## 2015-04-13 NOTE — Telephone Encounter (Signed)
Spoke with pt advised of MDs message.  Pt verbalized understanding. 

## 2015-04-25 ENCOUNTER — Telehealth: Payer: Self-pay | Admitting: Internal Medicine

## 2015-04-25 ENCOUNTER — Other Ambulatory Visit: Payer: Self-pay | Admitting: Internal Medicine

## 2015-05-02 ENCOUNTER — Ambulatory Visit (INDEPENDENT_AMBULATORY_CARE_PROVIDER_SITE_OTHER): Payer: Commercial Managed Care - HMO

## 2015-05-02 DIAGNOSIS — Z23 Encounter for immunization: Secondary | ICD-10-CM | POA: Diagnosis not present

## 2015-05-12 ENCOUNTER — Other Ambulatory Visit: Payer: Self-pay

## 2015-05-12 ENCOUNTER — Telehealth: Payer: Self-pay

## 2015-05-12 MED ORDER — LOSARTAN POTASSIUM 50 MG PO TABS: 75.0000 mg | ORAL_TABLET | Freq: Every day | ORAL | Status: DC

## 2015-05-12 NOTE — Telephone Encounter (Signed)
I did not change the patients losartan. Thanks. Eric.

## 2015-05-12 NOTE — Telephone Encounter (Signed)
Please advise, Should patient be on Losartan 50mg , 1 tablet or 1.5tablet.  There are orders for both.  Thanks

## 2015-05-12 NOTE — Telephone Encounter (Signed)
I have not seen her since April,  But  Dr Caryl Bis saw her recently  Did he change it?

## 2015-05-12 NOTE — Telephone Encounter (Signed)
Dr. Caryl Bis, can you look at this patient's losartan order, did you happen to make changes at her last appointment? Thanks

## 2015-05-25 ENCOUNTER — Other Ambulatory Visit: Payer: Self-pay

## 2015-05-25 MED ORDER — LEVOTHYROXINE SODIUM 50 MCG PO TABS: ORAL_TABLET | ORAL | Status: DC

## 2015-05-26 ENCOUNTER — Encounter: Payer: Self-pay | Admitting: Internal Medicine

## 2015-05-26 ENCOUNTER — Ambulatory Visit (INDEPENDENT_AMBULATORY_CARE_PROVIDER_SITE_OTHER): Payer: Commercial Managed Care - HMO | Admitting: Internal Medicine

## 2015-05-26 VITALS — BP 120/78 | HR 85 | Temp 97.8°F | Resp 12 | Ht 63.0 in | Wt 138.2 lb

## 2015-05-26 DIAGNOSIS — Z0000 Encounter for general adult medical examination without abnormal findings: Secondary | ICD-10-CM | POA: Diagnosis not present

## 2015-05-26 DIAGNOSIS — E034 Atrophy of thyroid (acquired): Secondary | ICD-10-CM

## 2015-05-26 DIAGNOSIS — E785 Hyperlipidemia, unspecified: Secondary | ICD-10-CM

## 2015-05-26 DIAGNOSIS — E038 Other specified hypothyroidism: Secondary | ICD-10-CM | POA: Diagnosis not present

## 2015-05-26 DIAGNOSIS — I10 Essential (primary) hypertension: Secondary | ICD-10-CM | POA: Diagnosis not present

## 2015-05-26 DIAGNOSIS — K5730 Diverticulosis of large intestine without perforation or abscess without bleeding: Secondary | ICD-10-CM

## 2015-05-26 LAB — COMPREHENSIVE METABOLIC PANEL
ALBUMIN: 3.9 g/dL (ref 3.5–5.2)
ALT: 13 U/L (ref 0–35)
AST: 18 U/L (ref 0–37)
Alkaline Phosphatase: 86 U/L (ref 39–117)
BUN: 13 mg/dL (ref 6–23)
CHLORIDE: 101 meq/L (ref 96–112)
CO2: 32 mEq/L (ref 19–32)
CREATININE: 0.75 mg/dL (ref 0.40–1.20)
Calcium: 9.7 mg/dL (ref 8.4–10.5)
GFR: 81.21 mL/min (ref >60.00)
GLUCOSE: 92 mg/dL (ref 70–99)
POTASSIUM: 4.6 meq/L (ref 3.5–5.1)
SODIUM: 138 meq/L (ref 135–145)
TOTAL PROTEIN: 6.9 g/dL (ref 6.0–8.3)
Total Bilirubin: 0.4 mg/dL (ref 0.2–1.2)

## 2015-05-26 LAB — TSH: TSH: 2.85 u[IU]/mL (ref 0.35–4.50)

## 2015-05-26 MED ORDER — PROMETHAZINE HCL 12.5 MG PO TABS: 12.5000 mg | ORAL_TABLET | Freq: Three times a day (TID) | ORAL | Status: DC | PRN

## 2015-05-26 NOTE — Patient Instructions (Signed)
Your recent stomach issue may have been due to a  a viral gastroenteritis  [facility] for [facility]ergan,  This is a medication for nausea.  It will make you sleepy, but may help your next episode.

## 2015-05-26 NOTE — Progress Notes (Signed)
Pre-visit discussion using our clinic review tool. No additional management support is needed unless otherwise documented below in the visit note.  

## 2015-05-26 NOTE — Progress Notes (Signed)
Subjective:  Patient ID: Crystal Kidd, female    DOB: Oct 09, 1945  Age: 69 y.o. MRN: [id number]  CC: The primary encounter diagnosis was Medicare annual wellness visit, subsequent. Diagnoses of Hypothyroidism due to acquired atrophy of thyroid, Essential hypertension, Hyperlipidemia, Hyperlipidemia LDL goal <100, and Diverticulosis of large intestine without hemorrhage were also pertinent to this visit.   Crystal Kidd presents for 6 month follow up on chronic issues including hypertensoin, hyperlipidemaia and hypothyroidism, and follow up on recent acute issues.  She feels fine today and is tolerating her medications without side effects.    Her recent episode of trapezius  muscle spasms  has resolved with appropriate treatment .   She notes that her left foot 4th toe was broken 2 weeks ago when she stubbed it against a stool  Last week had a 48 to 72 hour episode of gastroenteritis with Tm 100.6.  Vomiting and abd pain lasted a full day,  No diarrhea,  Actually became constipated  Could not keep fluids down for nearly 12 hours , then on Saturday the vomiting stopped .     Outpatient Prescriptions Prior to Visit  Medication Sig Dispense Refill  . calcium carbonate (TUMS) 500 MG chewable tablet Chew 1 tablet by mouth as needed.     Marland Kitchen CALCIUM PO Take by mouth daily.      . Cholecalciferol (VITAMIN D3) 2000 UNITS TABS Take 2,000 Units by mouth daily.    Marland Kitchen FIBER SELECT GUMMIES PO Take by mouth.      . fluticasone (FLONASE) 50 MCG/ACT nasal spray Place 2 sprays into both nostrils daily. 16 g 6  . gabapentin (NEURONTIN) 100 MG capsule Take 1 capsule (100 mg total) by mouth 3 (three) times daily. Every 8 hours as needed 90 capsule 3  . hydrochlorothiazide (HYDRODIURIL) 25 MG tablet 1/2 tablet daily prn 45 tablet 1  . levothyroxine (SYNTHROID, LEVOTHROID) 50 MCG tablet TAKE 1 TABLET DAILY EXCEPT TAKE 1 AND 1/2 TABLETS (75MCG) MONDAY, WEDNESDAY AND FRIDAY AS DIRECTED 108 tablet 2  . loratadine  (CLARITIN) 10 MG tablet Take 10 mg by mouth daily.    Marland Kitchen losartan (COZAAR) 50 MG tablet Take 1.5 tablets (75 mg total) by mouth daily. 135 tablet 1  . Multiple Vitamin (MULTIVITAMIN) tablet Take 1 tablet by mouth daily.      Marland Kitchen omeprazole (PRILOSEC) 40 MG capsule TAKE 1 CAPSULE EVERY DAY 90 capsule 1  . simvastatin (ZOCOR) 20 MG tablet TAKE 1 TABLET EVERY EVENING 90 tablet 1  . baclofen (LIORESAL) 10 MG tablet Take 0.5 tablets (5 mg total) by mouth 3 (three) times daily as needed for muscle spasms. (Patient not taking: Reported on 05/26/2015) 30 each 0   No facility-administered medications prior to visit.    Review of Systems;  Patient denies headache, fevers, malaise, unintentional weight loss, skin rash, eye pain, sinus congestion and sinus pain, sore throat, dysphagia,  hemoptysis , cough, dyspnea, wheezing, chest pain, palpitations, orthopnea, edema, abdominal pain, nausea, melena, diarrhea, constipation, flank pain, dysuria, hematuria, urinary  Frequency, nocturia, numbness, tingling, seizures,  Focal weakness, Loss of consciousness,  Tremor, insomnia, depression, anxiety, and suicidal ideation.      Objective:  BP 120/78 mmHg  Pulse 85  Temp(Src) 97.8 F (36.6 C) (Oral)  Resp 12  Ht 5\' 3"  (1.6 m)  Wt 138 lb 4 oz (62.71 kg)  BMI 24.50 kg/m2  SpO2 98%  BP Readings from Last 3 Encounters:  05/26/15 120/78  04/12/15 138/84  11/21/14  130/82    Wt Readings from Last 3 Encounters:  05/26/15 138 lb 4 oz (62.71 kg)  04/12/15 140 lb 6.4 oz (63.685 kg)  11/21/14 138 lb 4 oz (62.71 kg)    General appearance: alert, cooperative and appears stated age Ears: normal TM's and external ear canals both ears Throat: lips, mucosa, and tongue normal; teeth and gums normal Neck: no adenopathy, no carotid bruit, supple, symmetrical, trachea midline and thyroid not enlarged, symmetric, no tenderness/mass/nodules Back: symmetric, no curvature. ROM normal. No CVA tenderness. Lungs: clear to  auscultation bilaterally Heart: regular rate and rhythm, S1, S2 normal, no murmur, click, rub or gallop Abdomen: soft, non-tender; bowel sounds normal; no masses,  no organomegaly Pulses: 2+ and symmetric Skin: Skin color, texture, turgor normal. No rashes or lesions Lymph nodes: Cervical, supraclavicular, and axillary nodes normal.  Lab Results  Component Value Date   HGBA1C 5.6 08/25/2012   HGBA1C 5.6 05/05/2008   HGBA1C 5.7 01/07/2007    Lab Results  Component Value Date   CREATININE 0.75 05/26/2015   CREATININE 0.73 11/21/2014   CREATININE 0.75 06/13/2014    Lab Results  Component Value Date   WBC 4.9 06/13/2014   HGB 14.5 06/13/2014   HCT 42.1 06/13/2014   PLT 270.0 09/02/2013   GLUCOSE 92 05/26/2015   CHOL 170 06/13/2014   TRIG 134 06/13/2014   HDL 47 06/13/2014   LDLDIRECT 182.4 09/14/2010   LDLCALC 96 06/13/2014   ALT 13 05/26/2015   AST 18 05/26/2015   NA 138 05/26/2015   K 4.6 05/26/2015   CL 101 05/26/2015   CREATININE 0.75 05/26/2015   BUN 13 05/26/2015   CO2 32 05/26/2015   TSH 2.85 05/26/2015   HGBA1C 5.6 08/25/2012    No results found.  Assessment & Plan:   Problem List Items Addressed This Visit    Hypothyroidism    Thyroid function is WNL on current dose.  No current changes needed.   Lab Results  Component Value Date   TSH 2.85 05/26/2015           Relevant Orders   TSH (Completed)   Hyperlipidemia LDL goal <100    LDL and triglycerides have been at goal on current medications. She  has no side effects and liver enzymes are normal. No changes today.and will return for fasting lipids.  Lab Results  Component Value Date   CHOL 170 06/13/2014   HDL 47 06/13/2014   LDLCALC 96 06/13/2014   LDLDIRECT 182.4 09/14/2010   TRIG 134 06/13/2014   CHOLHDL 3.6 06/13/2014   Lab Results  Component Value Date   ALT 13 05/26/2015   AST 18 05/26/2015   ALKPHOS 86 05/26/2015   BILITOT 0.4 05/26/2015         Essential hypertension     Well controlled on current regimen. Renal function stable, no changes today.  Lab Results  Component Value Date   CREATININE 0.75 05/26/2015   Lab Results  Component Value Date   NA 138 05/26/2015   K 4.6 05/26/2015   CL 101 05/26/2015   CO2 32 05/26/2015         Relevant Orders   Comprehensive metabolic panel (Completed)   Diverticulosis of large intestine    Recent episode of viral GE may have been diverticulitis given concurrent constipation. Exam is normal today. Phenergan given to keep on hand to managed vomiting.   Lab Results  Component Value Date   WBC 4.9 06/13/2014   HGB 14.5 06/13/2014  HCT 42.1 06/13/2014   MCV 86 06/13/2014   PLT 270.0 09/02/2013         Medicare annual wellness visit, subsequent - Primary    Other Visit Diagnoses    Hyperlipidemia           I am having Ms. Rockers maintain her CALCIUM PO, multivitamin, calcium carbonate, FIBER SELECT GUMMIES PO, fluticasone, simvastatin, loratadine, Vitamin D3, gabapentin, hydrochlorothiazide, baclofen, omeprazole, losartan, levothyroxine, and promethazine.  Meds ordered this encounter  Medications  . DISCONTD: promethazine (PHENERGAN) 12.5 MG tablet    Sig: Take 1 tablet (12.5 mg total) by mouth every 8 (eight) hours as needed for nausea or vomiting.    Dispense:  20 tablet    Refill:  0  . promethazine (PHENERGAN) 12.5 MG tablet    Sig: Take 1 tablet (12.5 mg total) by mouth every 8 (eight) hours as needed for nausea or vomiting.    Dispense:  20 tablet    Refill:  0    Medications Discontinued During This Encounter  Medication Reason  . promethazine (PHENERGAN) 12.5 MG tablet Reorder   A total of 25 minutes of face to face time was spent with patient more than half of which was spent in counselling about the above mentioned conditions  and coordination of care   Follow-up: Return in about 6 months (around 11/24/2015).   [name], MD

## 2015-05-28 ENCOUNTER — Encounter: Payer: Self-pay | Admitting: Internal Medicine

## 2015-05-28 NOTE — Assessment & Plan Note (Signed)
Well controlled on current regimen. Renal function stable, no changes today.  Lab Results  Component Value Date   CREATININE 0.75 05/26/2015   Lab Results  Component Value Date   NA 138 05/26/2015   K 4.6 05/26/2015   CL 101 05/26/2015   CO2 32 05/26/2015

## 2015-05-28 NOTE — Assessment & Plan Note (Addendum)
Recent episode of viral GE may have been diverticulitis given concurrent constipation. Exam is normal today. Phenergan given to keep on hand to managed vomiting.   Lab Results  Component Value Date   WBC 4.9 06/13/2014   HGB 14.5 06/13/2014   HCT 42.1 06/13/2014   MCV 86 06/13/2014   PLT 270.0 09/02/2013

## 2015-05-28 NOTE — Assessment & Plan Note (Signed)
LDL and triglycerides have been at goal on current medications. She  has no side effects and liver enzymes are normal. No changes today.and will return for fasting lipids.  Lab Results  Component Value Date   CHOL 170 06/13/2014   HDL 47 06/13/2014   LDLCALC 96 06/13/2014   LDLDIRECT 182.4 09/14/2010   TRIG 134 06/13/2014   CHOLHDL 3.6 06/13/2014   Lab Results  Component Value Date   ALT 13 05/26/2015   AST 18 05/26/2015   ALKPHOS 86 05/26/2015   BILITOT 0.4 05/26/2015

## 2015-05-28 NOTE — Assessment & Plan Note (Signed)
Thyroid function is WNL on current dose.  No current changes needed.   Lab Results  Component Value Date   TSH 2.85 05/26/2015

## 2015-05-29 NOTE — Telephone Encounter (Signed)
Patient acknowledged

## 2015-06-07 DIAGNOSIS — D2271 Melanocytic nevi of right lower limb, including hip: Secondary | ICD-10-CM | POA: Diagnosis not present

## 2015-06-07 DIAGNOSIS — L814 Other melanin hyperpigmentation: Secondary | ICD-10-CM | POA: Diagnosis not present

## 2015-06-07 DIAGNOSIS — D1801 Hemangioma of skin and subcutaneous tissue: Secondary | ICD-10-CM | POA: Diagnosis not present

## 2015-06-07 DIAGNOSIS — L821 Other seborrheic keratosis: Secondary | ICD-10-CM | POA: Diagnosis not present

## 2015-06-07 DIAGNOSIS — L80 Vitiligo: Secondary | ICD-10-CM | POA: Diagnosis not present

## 2015-06-07 DIAGNOSIS — D225 Melanocytic nevi of trunk: Secondary | ICD-10-CM | POA: Diagnosis not present

## 2015-06-13 ENCOUNTER — Other Ambulatory Visit: Payer: Self-pay | Admitting: Internal Medicine

## 2015-06-13 NOTE — Telephone Encounter (Signed)
Ok to fill 

## 2015-06-17 NOTE — Telephone Encounter (Signed)
Ok to refill,  Refill sent  

## 2015-06-21 ENCOUNTER — Other Ambulatory Visit: Payer: Self-pay | Admitting: Internal Medicine

## 2015-08-19 ENCOUNTER — Other Ambulatory Visit: Payer: Self-pay | Admitting: Internal Medicine

## 2015-09-06 ENCOUNTER — Other Ambulatory Visit: Payer: Self-pay | Admitting: Internal Medicine

## 2015-09-06 NOTE — Telephone Encounter (Signed)
filled

## 2015-09-21 ENCOUNTER — Encounter: Payer: Self-pay | Admitting: Gastroenterology

## 2015-09-25 ENCOUNTER — Other Ambulatory Visit: Payer: Self-pay | Admitting: Internal Medicine

## 2015-10-30 ENCOUNTER — Encounter: Payer: Self-pay | Admitting: Gastroenterology

## 2015-11-07 ENCOUNTER — Other Ambulatory Visit: Payer: Self-pay | Admitting: Internal Medicine

## 2015-11-24 DIAGNOSIS — Z803 Family history of malignant neoplasm of breast: Secondary | ICD-10-CM | POA: Diagnosis not present

## 2015-11-24 DIAGNOSIS — Z1231 Encounter for screening mammogram for malignant neoplasm of breast: Secondary | ICD-10-CM | POA: Diagnosis not present

## 2015-11-24 LAB — HM MAMMOGRAPHY

## 2015-11-30 ENCOUNTER — Encounter: Payer: Self-pay | Admitting: Internal Medicine

## 2015-11-30 ENCOUNTER — Ambulatory Visit (INDEPENDENT_AMBULATORY_CARE_PROVIDER_SITE_OTHER): Payer: Commercial Managed Care - HMO | Admitting: Internal Medicine

## 2015-11-30 VITALS — BP 138/78 | HR 75 | Temp 98.1°F | Resp 12 | Ht 62.5 in | Wt 142.8 lb

## 2015-11-30 DIAGNOSIS — M25421 Effusion, right elbow: Secondary | ICD-10-CM | POA: Diagnosis not present

## 2015-11-30 DIAGNOSIS — M8580 Other specified disorders of bone density and structure, unspecified site: Secondary | ICD-10-CM | POA: Diagnosis not present

## 2015-11-30 DIAGNOSIS — M7989 Other specified soft tissue disorders: Secondary | ICD-10-CM

## 2015-11-30 DIAGNOSIS — Z23 Encounter for immunization: Secondary | ICD-10-CM | POA: Diagnosis not present

## 2015-11-30 DIAGNOSIS — E038 Other specified hypothyroidism: Secondary | ICD-10-CM | POA: Diagnosis not present

## 2015-11-30 DIAGNOSIS — Z803 Family history of malignant neoplasm of breast: Secondary | ICD-10-CM

## 2015-11-30 DIAGNOSIS — E785 Hyperlipidemia, unspecified: Secondary | ICD-10-CM | POA: Diagnosis not present

## 2015-11-30 DIAGNOSIS — E559 Vitamin D deficiency, unspecified: Secondary | ICD-10-CM | POA: Diagnosis not present

## 2015-11-30 DIAGNOSIS — E034 Atrophy of thyroid (acquired): Secondary | ICD-10-CM

## 2015-11-30 DIAGNOSIS — Z0000 Encounter for general adult medical examination without abnormal findings: Secondary | ICD-10-CM

## 2015-11-30 DIAGNOSIS — I10 Essential (primary) hypertension: Secondary | ICD-10-CM | POA: Diagnosis not present

## 2015-11-30 LAB — COMPREHENSIVE METABOLIC PANEL
ALT: 16 U/L (ref 0–35)
AST: 21 U/L (ref 0–37)
Albumin: 4 g/dL (ref 3.5–5.2)
Alkaline Phosphatase: 85 U/L (ref 39–117)
BUN: 9 mg/dL (ref 6–23)
CHLORIDE: 105 meq/L (ref 96–112)
CO2: 31 meq/L (ref 19–32)
Calcium: 9.7 mg/dL (ref 8.4–10.5)
Creatinine, Ser: 0.71 mg/dL (ref 0.40–1.20)
GFR: 86.39 mL/min (ref >60.00)
GLUCOSE: 94 mg/dL (ref 70–99)
POTASSIUM: 4.6 meq/L (ref 3.5–5.1)
Sodium: 142 mEq/L (ref 135–145)
Total Bilirubin: 0.6 mg/dL (ref 0.2–1.2)
Total Protein: 6.3 g/dL (ref 6.0–8.3)

## 2015-11-30 LAB — CBC WITH DIFFERENTIAL/PLATELET
BASOS ABS: 0.1 10*3/uL (ref 0.0–0.1)
Basophils Relative: 1.3 % (ref 0.0–3.0)
EOS ABS: 0.4 10*3/uL (ref 0.0–0.7)
Eosinophils Relative: 7.3 % — ABNORMAL HIGH (ref 0.0–5.0)
HCT: 41 % (ref 36.0–46.0)
Hemoglobin: 13.9 g/dL (ref 12.0–15.0)
LYMPHS ABS: 1.1 10*3/uL (ref 0.7–4.0)
Lymphocytes Relative: 20.6 % (ref 12.0–46.0)
MCHC: 34.1 g/dL (ref 30.0–36.0)
MCV: 88.5 fl (ref 78.0–100.0)
Monocytes Absolute: 0.5 10*3/uL (ref 0.1–1.0)
Monocytes Relative: 9.6 % (ref 3.0–12.0)
NEUTROS ABS: 3.4 10*3/uL (ref 1.4–7.7)
NEUTROS PCT: 61.2 % (ref 43.0–77.0)
PLATELETS: 250 10*3/uL (ref 150.0–400.0)
RBC: 4.63 Mil/uL (ref 3.87–5.11)
RDW: 13.4 % (ref 11.5–15.5)
WBC: 5.5 10*3/uL (ref 4.0–10.5)

## 2015-11-30 LAB — D-DIMER, QUANTITATIVE (NOT AT ARMC): D DIMER QUANT: 0.27 ug{FEU}/mL (ref 0.00–0.48)

## 2015-11-30 LAB — LIPID PANEL
CHOL/HDL RATIO: 3
Cholesterol: 141 mg/dL (ref 0–200)
HDL: 44.2 mg/dL (ref >39.00)
LDL CALC: 72 mg/dL (ref 0–99)
NonHDL: 96.7
TRIGLYCERIDES: 125 mg/dL (ref 0.0–149.0)
VLDL: 25 mg/dL (ref 0.0–40.0)

## 2015-11-30 LAB — VITAMIN D 25 HYDROXY (VIT D DEFICIENCY, FRACTURES): VITD: 45.83 ng/mL (ref 30.00–100.00)

## 2015-11-30 LAB — TSH: TSH: 3.44 u[IU]/mL (ref 0.35–4.50)

## 2015-11-30 LAB — LDL CHOLESTEROL, DIRECT: LDL DIRECT: 79 mg/dL

## 2015-11-30 MED ORDER — FLUTICASONE PROPIONATE 50 MCG/ACT NA SUSP: NASAL | Status: DC

## 2015-11-30 NOTE — Progress Notes (Signed)
Patient ID: Crystal Kidd, female    DOB: 07/09/1946  Age: 70 y.o. MRN: [id number]  The patient is here for annual Medicare wellness examination and management of other chronic and acute problems.   FOBT NEG MAY 2016 MAMMOGRAM DONE LAST Friday  SOLIS MAY 216 high risk breast mri suggested last year  TAH AT AGE 29 FOR MENORRHAGIA  Took Evista for osteopenia , for 3 years but her PCP stopped it due to  Iron of BRCA  (SISTER IN LATE 50'S,  MOTHER AT AGE 68'S)  Colonoscopy may 15   The risk factors are reflected in the social history.  The roster of all physicians providing medical care to patient - is listed in the Snapshot section of the chart.  Activities of daily living:  The patient is 100% independent in all ADLs: dressing, toileting, feeding as well as independent mobility  Home safety : The patient has smoke detectors in the home. They wear seatbelts.  There are no firearms at home. There is no violence in the home.   There is no risks for hepatitis, STDs or HIV. There is no   history of blood transfusion. They have no travel history to infectious disease endemic areas of the world.  The patient has seen their dentist in the last six month. They have seen their eye doctor in the last year. They admit to slight hearing difficulty with regard to whispered voices and some television programs.  They have deferred audiologic testing in the last year.  They do not  have excessive sun exposure. Discussed the need for sun protection: hats, long sleeves and use of sunscreen if there is significant sun exposure.   Diet: the importance of a healthy diet is discussed. They do have a healthy diet.  The benefits of regular aerobic exercise were discussed. She walks 4 times per week ,  20 minutes.   Depression screen: there are no signs or vegative symptoms of depression- irritability, change in appetite, anhedonia, sadness/tearfullness.  Cognitive assessment: the patient manages all their financial  and personal affairs and is actively engaged. They could relate day,date,year and events; recalled 2/3 objects at 3 minutes; performed clock-face test normally.  The following portions of the patient's history were reviewed and updated as appropriate: allergies, current medications, past family history, past medical history,  past surgical history, past social history  and problem list.  Visual acuity was not assessed per patient preference since she has regular follow up with her ophthalmologist. Hearing and body mass index were assessed and reviewed.   During the course of the visit the patient was educated and counseled about appropriate screening and preventive services including : fall prevention , diabetes screening, nutrition counseling, colorectal cancer screening, and recommended immunizations.    CC: The primary encounter diagnosis was Swelling of joint of upper arm, right. Diagnoses of Osteopenia, Hypothyroidism due to acquired atrophy of thyroid, Hyperlipidemia, Vitamin D deficiency, Essential hypertension, Family history of breast cancer in first degree relative, Swelling of limb, left, Need for prophylactic vaccination against Streptococcus pneumoniae (pneumococcus), Medicare annual wellness visit, subsequent, and Swelling of right upper extremity were also pertinent to this visit.  Right arm has developed swelling in the medial epicondyle/antecubital fossa near the basilic vein and the beins in the arm above it are engorged  Allergic rhinitis symptoms without fever, headache or wheezing   History [place] has a past medical history of Diverticulosis; GERD (gastroesophageal reflux disease); Allergic rhinoconjunctivitis; Hyperlipidemia; Hypertension; Thyroid disease; Vitiligo; Osteopenia; Transfusion  history (1968); Allergy (1992); Arthritis; Cataract; and Trapezius muscle spasm (04/13/2015).   She has past surgical history that includes Rotator cuff repair; Abdominal hysterectomy; Breast  surgery; Appendectomy; Esophageal dilation; Colonoscopy; Eye surgery; Fracture surgery (2003or 2004); and Tubal ligation.   Her family history includes Breast cancer in her sister; COPD in her brother, father, and son; Cancer in her mother; Cancer (age of onset: 31) in her sister; Cancer (age of onset: 30) in her maternal aunt; Colon cancer in her maternal aunt; Coronary artery disease in her brother. There is no history of Diabetes, Stroke, or Heart disease.She reports that she has never smoked. She has never used smokeless tobacco. She reports that she drinks alcohol. She reports that she does not use illicit drugs.  Outpatient Prescriptions Prior to Visit  Medication Sig Dispense Refill  . baclofen (LIORESAL) 10 MG tablet Take 0.5 tablets (5 mg total) by mouth 3 (three) times daily as needed for muscle spasms. 30 each 0  . calcium carbonate (TUMS) 500 MG chewable tablet Chew 1 tablet by mouth as needed.     Marland Kitchen FIBER SELECT GUMMIES PO Take by mouth.      . gabapentin (NEURONTIN) 100 MG capsule TAKE 1 CAPSULE (100 MG TOTAL) BY MOUTH 3 (THREE) TIMES DAILY. EVERY 8 HOURS AS NEEDED 90 capsule 3  . hydrochlorothiazide (HYDRODIURIL) 25 MG tablet TAKE 1/2 TABLET DAILY AS NEEDED 45 tablet 1  . levothyroxine (SYNTHROID, LEVOTHROID) 50 MCG tablet TAKE 1 TABLET DAILY EXCEPT TAKE 1 AND 1/2 TABLETS (75MCG) MONDAY, WEDNESDAY AND FRIDAY AS DIRECTED 108 tablet 2  . loratadine (CLARITIN) 10 MG tablet Take 10 mg by mouth daily.    Marland Kitchen losartan (COZAAR) 50 MG tablet TAKE 1 AND 1/2 TABLETS EVERY DAY (CALL OFFICE FOR A NURSE BLOOD PRESSURE CHECK) 135 tablet 1  . Multiple Vitamin (MULTIVITAMIN) tablet Take 2 tablets by mouth daily.     Marland Kitchen omeprazole (PRILOSEC) 40 MG capsule TAKE 1 CAPSULE EVERY DAY 90 capsule 3  . promethazine (PHENERGAN) 12.5 MG tablet Take 1 tablet (12.5 mg total) by mouth every 8 (eight) hours as needed for nausea or vomiting. 20 tablet 0  . simvastatin (ZOCOR) 20 MG tablet TAKE 1 TABLET EVERY EVENING  90 tablet 1  . Cholecalciferol (VITAMIN D3) 2000 UNITS TABS Take 2,000 Units by mouth daily. Reported on 11/30/2015    . fluticasone (FLONASE) 50 MCG/ACT nasal spray Place 2 sprays into both nostrils daily. (Patient not taking: Reported on 11/30/2015) 16 g 6  . CALCIUM PO Take by mouth daily.       No facility-administered medications prior to visit.    Review of Systems  Objective:  BP 138/78 mmHg  Pulse 75  Temp(Src) 98.1 F (36.7 C) (Oral)  Resp 12  Ht 5' 2.5" (1.588 m)  Wt 142 lb 12 oz (64.751 kg)  BMI 25.68 kg/m2  SpO2 97%  Physical Exam    Assessment & Plan:   Problem List Items Addressed This Visit    Medicare annual wellness visit, subsequent    Annual Medicare wellness  exam was done as well as a comprehensive physical exam and management of acute and chronic conditions .  During the course of the visit the patient was educated and counseled about appropriate screening and preventive services including : fall prevention , diabetes screening, nutrition counseling, colorectal cancer screening, and recommended immunizations.  Printed recommendations for health maintenance screenings was given.       Swelling of right upper extremity   Hypothyroidism  Essential hypertension   Relevant Orders   Comprehensive metabolic panel (Completed)    Other Visit Diagnoses    Swelling of joint of upper arm, right    -  Primary    Relevant Orders    UE VENOUS DUPLEX    CBC with Differential/Platelet (Completed)    Osteopenia        Relevant Orders    TSH (Completed)    DG Bone Density    Hyperlipidemia        Relevant Orders    Lipid panel (Completed)    LDL cholesterol, direct (Completed)    Vitamin D deficiency        Relevant Orders    VITAMIN D 25 Hydroxy (Vit-D Deficiency, Fractures) (Completed)    Family history of breast cancer in first degree relative        Relevant Orders    MR Breast Bilateral W Contrast    Swelling of limb, left        Relevant Orders     D-Dimer, Quantitative (Completed)    Need for prophylactic vaccination against Streptococcus pneumoniae (pneumococcus)        Relevant Orders    Pneumococcal polysaccharide vaccine 23-valent greater than or equal to 2yo subcutaneous/IM (Completed)       I have discontinued Ms. Mofield's CALCIUM PO. I am also having her start on fluticasone. Additionally, I am having her maintain her multivitamin, calcium carbonate, FIBER SELECT GUMMIES PO, fluticasone, loratadine, Vitamin D3, baclofen, levothyroxine, promethazine, gabapentin, hydrochlorothiazide, omeprazole, losartan, and simvastatin.  Meds ordered this encounter  Medications  . fluticasone (FLONASE) 50 MCG/ACT nasal spray    Sig: 2 sprays in each nostril once daily    Dispense:  16 g    Refill:  6    Medications Discontinued During This Encounter  Medication Reason  . CALCIUM PO Patient Preference    Follow-up: No Follow-up on file.   [name], MD

## 2015-11-30 NOTE — Patient Instructions (Addendum)
I am ordering an ultrasound of your left arm to rule out a blood clot.   For your allergies ,  You can use Benadryl but you should also consider adding one of these newer second generation antihistamines that are longer acting, non sedating and  available OTC:  - Generic  Zyrtec, which is cetirizine.    - generic Allegra , available generically as fexofenadine ; comes in 60 mg and 180 mg once daily strengths.    - Generic Claritin :  also available as loratidine .     I recommend considering restarting Evista after your Bone Density  Test IF YOUR ultrasound is negative for blood clot   Menopause is a normal process in which your reproductive ability comes to an end. This process happens gradually over a span of months to years, usually between the ages of 34 and 30. Menopause is complete when you have missed 12 consecutive menstrual periods. It is important to talk with your health care provider about some of the most common conditions that affect postmenopausal women, such as heart disease, cancer, and bone loss (osteoporosis). Adopting a healthy lifestyle and getting preventive care can help to promote your health and wellness. Those actions can also lower your chances of developing some of these common conditions. WHAT SHOULD I KNOW ABOUT MENOPAUSE? During menopause, you may experience a number of symptoms, such as:  Moderate-to-severe hot flashes.  Night sweats.  Decrease in sex drive.  Mood swings.  Headaches.  Tiredness.  Irritability.  Memory problems.  Insomnia. Choosing to treat or not to treat menopausal changes is an individual decision that you make with your health care provider. WHAT SHOULD I KNOW ABOUT HORMONE REPLACEMENT THERAPY AND SUPPLEMENTS? Hormone therapy products are effective for treating symptoms that are associated with menopause, such as hot flashes and night sweats. Hormone replacement carries certain risks, especially as you become older. If you are  thinking about using estrogen or estrogen with progestin treatments, discuss the benefits and risks with your health care provider. WHAT SHOULD I KNOW ABOUT HEART DISEASE AND STROKE? Heart disease, heart attack, and stroke become more likely as you age. This may be due, in part, to the hormonal changes that your body experiences during menopause. These can affect how your body processes dietary fats, triglycerides, and cholesterol. Heart attack and stroke are both medical emergencies. There are many things that you can do to help prevent heart disease and stroke:  Have your blood pressure checked at least every 1-2 years. High blood pressure causes heart disease and increases the risk of stroke.  If you are 55-36 years old, ask your health care provider if you should take aspirin to prevent a heart attack or a stroke.  Do not use any tobacco products, including cigarettes, chewing tobacco, or electronic cigarettes. If you need help quitting, ask your health care provider.  It is important to eat a healthy diet and maintain a healthy weight.  Be sure to include plenty of vegetables, fruits, low-fat dairy products, and lean protein.  Avoid eating foods that are high in solid fats, added sugars, or salt (sodium).  Get regular exercise. This is one of the most important things that you can do for your health.  Try to exercise for at least 150 minutes each week. The type of exercise that you do should increase your heart rate and make you sweat. This is known as moderate-intensity exercise.  Try to do strengthening exercises at least twice each week. Do  these in addition to the moderate-intensity exercise.  Know your numbers.Ask your health care provider to check your cholesterol and your blood glucose. Continue to have your blood tested as directed by your health care provider. WHAT SHOULD I KNOW ABOUT CANCER SCREENING? There are several types of cancer. Take the following steps to reduce your  risk and to catch any cancer development as early as possible. Breast Cancer  Practice breast self-awareness.  This means understanding how your breasts normally appear and feel.  It also means doing regular breast self-exams. Let your health care provider know about any changes, no matter how small.  If you are 67 or older, have a clinician do a breast exam (clinical breast exam or CBE) every year. Depending on your age, family history, and medical history, it may be recommended that you also have a yearly breast X-ray (mammogram).  If you have a family history of breast cancer, talk with your health care provider about genetic screening.  If you are at high risk for breast cancer, talk with your health care provider about having an MRI and a mammogram every year.  Breast cancer (BRCA) gene test is recommended for women who have family members with BRCA-related cancers. Results of the assessment will determine the need for genetic counseling and BRCA1 and for BRCA2 testing. BRCA-related cancers include these types:  Breast. This occurs in males or females.  Ovarian.  Tubal. This may also be called fallopian tube cancer.  Cancer of the abdominal or pelvic lining (peritoneal cancer).  Prostate.  Pancreatic. Cervical, Uterine, and Ovarian Cancer Your health care provider may recommend that you be screened regularly for cancer of the pelvic organs. These include your ovaries, uterus, and vagina. This screening involves a pelvic exam, which includes checking for microscopic changes to the surface of your cervix (Pap test).  For women ages 21-65, health care providers may recommend a pelvic exam and a Pap test every three years. For women ages 48-65, they may recommend the Pap test and pelvic exam, combined with testing for human papilloma virus (HPV), every five years. Some types of HPV increase your risk of cervical cancer. Testing for HPV may also be done on women of any age who have  unclear Pap test results.  Other health care providers may not recommend any screening for nonpregnant women who are considered low risk for pelvic cancer and have no symptoms. Ask your health care provider if a screening pelvic exam is right for you.  If you have had past treatment for cervical cancer or a condition that could lead to cancer, you need Pap tests and screening for cancer for at least 20 years after your treatment. If Pap tests have been discontinued for you, your risk factors (such as having a new sexual partner) need to be reassessed to determine if you should start having screenings again. Some women have medical problems that increase the chance of getting cervical cancer. In these cases, your health care provider may recommend that you have screening and Pap tests more often.  If you have a family history of uterine cancer or ovarian cancer, talk with your health care provider about genetic screening.  If you have vaginal bleeding after reaching menopause, tell your health care provider.  There are currently no reliable tests available to screen for ovarian cancer. Lung Cancer Lung cancer screening is recommended for adults 63-8 years old who are at high risk for lung cancer because of a history of smoking. A yearly  low-dose CT scan of the lungs is recommended if you:  Currently smoke.  Have a history of at least 30 pack-years of smoking and you currently smoke or have quit within the past 15 years. A pack-year is smoking an average of one pack of cigarettes per day for one year. Yearly screening should:  Continue until it has been 15 years since you quit.  Stop if you develop a health problem that would prevent you from having lung cancer treatment. Colorectal Cancer  This type of cancer can be detected and can often be prevented.  Routine colorectal cancer screening usually begins at age 53 and continues through age 79.  If you have risk factors for colon cancer,  your health care provider may recommend that you be screened at an earlier age.  If you have a family history of colorectal cancer, talk with your health care provider about genetic screening.  Your health care provider may also recommend using home test kits to check for hidden blood in your stool.  A small camera at the end of a tube can be used to examine your colon directly (sigmoidoscopy or colonoscopy). This is done to check for the earliest forms of colorectal cancer.  Direct examination of the colon should be repeated every 5-10 years until age 42. However, if early forms of precancerous polyps or small growths are found or if you have a family history or genetic risk for colorectal cancer, you may need to be screened more often. Skin Cancer  Check your skin from head to toe regularly.  Monitor any moles. Be sure to tell your health care provider:  About any new moles or changes in moles, especially if there is a change in a mole's shape or color.  If you have a mole that is larger than the size of a pencil eraser.  If any of your family members has a history of skin cancer, especially at a young age, talk with your health care provider about genetic screening.  Always use sunscreen. Apply sunscreen liberally and repeatedly throughout the day.  Whenever you are outside, protect yourself by wearing long sleeves, pants, a wide-brimmed hat, and sunglasses. WHAT SHOULD I KNOW ABOUT OSTEOPOROSIS? Osteoporosis is a condition in which bone destruction happens more quickly than new bone creation. After menopause, you may be at an increased risk for osteoporosis. To help prevent osteoporosis or the bone fractures that can happen because of osteoporosis, the following is recommended:  If you are 63-48 years old, get at least 1,000 mg of calcium and at least 600 mg of vitamin D per day.  If you are older than age 22 but younger than age 55, get at least 1,200 mg of calcium and at least 600  mg of vitamin D per day.  If you are older than age 27, get at least 1,200 mg of calcium and at least 800 mg of vitamin D per day. Smoking and excessive alcohol intake increase the risk of osteoporosis. Eat foods that are rich in calcium and vitamin D, and do weight-bearing exercises several times each week as directed by your health care provider. WHAT SHOULD I KNOW ABOUT HOW MENOPAUSE AFFECTS [place]? Depression may occur at any age, but it is more common as you become older. Common symptoms of depression include:  Low or sad mood.  Changes in sleep patterns.  Changes in appetite or eating patterns.  Feeling an overall lack of motivation or enjoyment of activities that you previously  enjoyed.  Frequent crying spells. Talk with your health care provider if you think that you are experiencing depression. WHAT SHOULD I KNOW ABOUT IMMUNIZATIONS? It is important that you get and maintain your immunizations. These include:  Tetanus, diphtheria, and pertussis (Tdap) booster vaccine.  Influenza every year before the flu season begins.  Pneumonia vaccine.  Shingles vaccine. Your health care provider may also recommend other immunizations.   This information is not intended to replace advice given to you by your health care provider. Make sure you discuss any questions you have with your health care provider.   Document Released: 09/20/2005 Document Revised: 08/19/2014 Document Reviewed: 03/31/2014 Elsevier Interactive Patient Education [employer].

## 2015-11-30 NOTE — Progress Notes (Signed)
Pre-visit discussion using our clinic review tool. No additional management support is needed unless otherwise documented below in the visit note.  

## 2015-12-01 ENCOUNTER — Other Ambulatory Visit: Payer: Self-pay | Admitting: Internal Medicine

## 2015-12-01 DIAGNOSIS — M25511 Pain in right shoulder: Secondary | ICD-10-CM

## 2015-12-02 DIAGNOSIS — M7989 Other specified soft tissue disorders: Secondary | ICD-10-CM | POA: Insufficient documentation

## 2015-12-02 NOTE — Assessment & Plan Note (Signed)

## 2015-12-03 ENCOUNTER — Encounter: Payer: Self-pay | Admitting: Internal Medicine

## 2015-12-04 ENCOUNTER — Other Ambulatory Visit: Payer: Self-pay | Admitting: Internal Medicine

## 2015-12-04 DIAGNOSIS — Z1239 Encounter for other screening for malignant neoplasm of breast: Secondary | ICD-10-CM

## 2015-12-05 ENCOUNTER — Encounter: Payer: Self-pay | Admitting: Internal Medicine

## 2015-12-07 ENCOUNTER — Ambulatory Visit: Payer: Commercial Managed Care - HMO

## 2015-12-11 ENCOUNTER — Ambulatory Visit (AMBULATORY_SURGERY_CENTER): Payer: Self-pay | Admitting: *Deleted

## 2015-12-11 ENCOUNTER — Encounter: Payer: Self-pay | Admitting: Gastroenterology

## 2015-12-11 VITALS — Ht 62.5 in | Wt 142.0 lb

## 2015-12-11 DIAGNOSIS — Z1211 Encounter for screening for malignant neoplasm of colon: Secondary | ICD-10-CM

## 2015-12-11 MED ORDER — NA SULFATE-K SULFATE-MG SULF 17.5-3.13-1.6 GM/177ML PO SOLN: 1.0000 | Freq: Once | ORAL | Status: DC

## 2015-12-11 NOTE — Progress Notes (Signed)
No egg or soy allergy known to patient  No issues with past sedation with any surgeries  or procedures-with general anesthesia has post op N/V, no intubation problems  No diet pills per patient No home 02 use per patient  No blood thinners per patient  Pt denies issues with constipation

## 2015-12-12 ENCOUNTER — Ambulatory Visit
Admission: RE | Admit: 2015-12-12 | Discharge: 2015-12-12 | Disposition: A | Discharge status: Home / Self Care | Payer: Commercial Managed Care - HMO | Source: Ambulatory Visit | Attending: Internal Medicine | Admitting: Internal Medicine

## 2015-12-12 DIAGNOSIS — M25511 Pain in right shoulder: Secondary | ICD-10-CM | POA: Insufficient documentation

## 2015-12-12 DIAGNOSIS — M79601 Pain in right arm: Secondary | ICD-10-CM | POA: Diagnosis not present

## 2015-12-14 ENCOUNTER — Encounter: Payer: Self-pay | Admitting: Internal Medicine

## 2015-12-20 ENCOUNTER — Other Ambulatory Visit: Payer: Commercial Managed Care - HMO

## 2015-12-20 DIAGNOSIS — H04123 Dry eye syndrome of bilateral lacrimal glands: Secondary | ICD-10-CM | POA: Diagnosis not present

## 2015-12-20 DIAGNOSIS — H524 Presbyopia: Secondary | ICD-10-CM | POA: Diagnosis not present

## 2015-12-20 DIAGNOSIS — H26493 Other secondary cataract, bilateral: Secondary | ICD-10-CM | POA: Diagnosis not present

## 2015-12-21 ENCOUNTER — Ambulatory Visit
Admission: RE | Admit: 2015-12-21 | Discharge: 2015-12-21 | Disposition: A | Discharge status: Home / Self Care | Payer: Commercial Managed Care - HMO | Source: Ambulatory Visit | Attending: Internal Medicine | Admitting: Internal Medicine

## 2015-12-21 ENCOUNTER — Encounter: Payer: Self-pay | Admitting: Internal Medicine

## 2015-12-21 DIAGNOSIS — Z1239 Encounter for other screening for malignant neoplasm of breast: Secondary | ICD-10-CM

## 2015-12-21 DIAGNOSIS — N6489 Other specified disorders of breast: Secondary | ICD-10-CM | POA: Diagnosis not present

## 2015-12-21 MED ORDER — GADOBENATE DIMEGLUMINE 529 MG/ML IV SOLN
13.0000 mL | Freq: Once | INTRAVENOUS | Status: AC | PRN
  Administered 2015-12-21: 13 mL via INTRAVENOUS

## 2015-12-22 ENCOUNTER — Encounter: Payer: Self-pay | Admitting: Internal Medicine

## 2015-12-23 ENCOUNTER — Encounter: Payer: Self-pay | Admitting: Internal Medicine

## 2015-12-25 ENCOUNTER — Ambulatory Visit (AMBULATORY_SURGERY_CENTER): Payer: Commercial Managed Care - HMO | Admitting: Gastroenterology

## 2015-12-25 ENCOUNTER — Encounter: Payer: Self-pay | Admitting: Gastroenterology

## 2015-12-25 VITALS — BP 140/83 | HR 74 | Temp 98.0°F | Resp 12 | Ht 62.0 in | Wt 142.0 lb

## 2015-12-25 DIAGNOSIS — I10 Essential (primary) hypertension: Secondary | ICD-10-CM | POA: Diagnosis not present

## 2015-12-25 DIAGNOSIS — Z1211 Encounter for screening for malignant neoplasm of colon: Secondary | ICD-10-CM

## 2015-12-25 DIAGNOSIS — D123 Benign neoplasm of transverse colon: Secondary | ICD-10-CM

## 2015-12-25 DIAGNOSIS — K219 Gastro-esophageal reflux disease without esophagitis: Secondary | ICD-10-CM | POA: Diagnosis not present

## 2015-12-25 DIAGNOSIS — K5730 Diverticulosis of large intestine without perforation or abscess without bleeding: Secondary | ICD-10-CM | POA: Diagnosis not present

## 2015-12-25 DIAGNOSIS — D122 Benign neoplasm of ascending colon: Secondary | ICD-10-CM | POA: Diagnosis not present

## 2015-12-25 MED ORDER — SODIUM CHLORIDE 0.9 % IV SOLN: 500.0000 mL | INTRAVENOUS | Status: DC

## 2015-12-25 NOTE — Op Note (Signed)
[facility] Patient Name: Crystal Kidd Procedure Date: 12/25/2015 8:34 AM MRN: [id number] Endoscopist: Ladene Artist , MD Age: 70 Referring MD:  Date of Birth: Nov 24, 1945 Gender: Female Procedure:                Colonoscopy Indications:              Screening for colorectal malignant neoplasm Medicines:                Monitored Anesthesia Care Procedure:                Pre-Anesthesia Assessment:                           - Prior to the procedure, a History and Physical                            was performed, and patient medications and                            allergies were reviewed. The patient's tolerance of                            previous anesthesia was also reviewed. The risks                            and benefits of the procedure and the sedation                            options and risks were discussed with the patient.                            All questions were answered, and informed consent                            was obtained. Prior Anticoagulants: The patient has                            taken no previous anticoagulant or antiplatelet                            agents. ASA Grade Assessment: II - A patient with                            mild systemic disease. After reviewing the risks                            and benefits, the patient was deemed in                            satisfactory condition to undergo the procedure.                           After obtaining informed consent, the colonoscope  was passed under direct vision. Throughout the                            procedure, the patient's blood pressure, pulse, and                            oxygen saturations were monitored continuously. The                            Model PCF-H190L [id number]) scope was introduced                            through the anus and advanced to the the cecum,                            identified by appendiceal orifice and  ileocecal                            valve. The colonoscopy was performed without                            difficulty. The patient tolerated the procedure                            well. The quality of the bowel preparation was                            adequate. The ileocecal valve, appendiceal orifice,                            and rectum were photographed. Scope In: 8:45:09 AM Scope Out: 8:59:31 AM Scope Withdrawal Time: 0 hours 11 minutes 40 seconds  Total Procedure Duration: 0 hours 14 minutes 22 seconds  Findings:                 The digital rectal exam was normal.                           Two sessile polyps were found in the ascending                            colon. The polyps were 10 to 15 mm in size. These                            polyps were removed with a hot snare. Resection and                            retrieval were complete.                           A 10 mm polyp was found in the ascending colon. The                            polyp was semi-pedunculated. The polyp  was removed                            with a hot snare. Resection and retrieval were                            complete.                           A 7 mm polyp was found in the transverse colon. The                            polyp was sessile. The polyp was removed with a                            cold snare. Resection and retrieval were complete.                           Many small-mouthed diverticula were found in the                            sigmoid colon and descending colon. There was                            narrowing of the colon in association with the                            diverticular opening. There was evidence of                            diverticular spasm. There was no evidence of                            diverticular bleeding.                           The exam was otherwise normal throughout the                            examined colon.                            The retroflexed view of the distal rectum and anal                            verge was normal and showed no anal or rectal                            abnormalities. Complications:            No immediate complications. Estimated Blood Loss:     Estimated blood loss: none. Impression:               - Two 10 to 15 mm polyps in the ascending colon,  removed with a hot snare. Resected and retrieved.                           - One 10 mm polyp in the ascending colon, removed                            with a hot snare. Resected and retrieved.                           - One 7 mm polyp in the transverse colon, removed                            with a cold snare. Resected and retrieved.                           - Moderate diverticulosis in the sigmoid colon and                            in the descending colon. Recommendation:           - Patient has a contact number available for                            emergencies. The signs and symptoms of potential                            delayed complications were discussed with the                            patient. Return to normal activities tomorrow.                            Written discharge instructions were provided to the                            patient.                           - High fiber diet indefinitely.                           - Continue present medications.                           - No aspirin, ibuprofen, naproxen, or other                            non-steroidal anti-inflammatory drugs for 2 weeks                            after polyp removal.                           - Await pathology results.                           -  Repeat colonoscopy in 3 years for surveillance                            pending pathology review. Ladene Artist, MD 12/25/2015 9:05:47 AM This report has been signed electronically.

## 2015-12-25 NOTE — Progress Notes (Signed)
Called to room to assist during endoscopic procedure.  Patient ID and intended procedure confirmed with present staff. Received instructions for my participation in the procedure from the performing physician.  

## 2015-12-25 NOTE — Progress Notes (Signed)
Report given to PACU RN, vss 

## 2015-12-25 NOTE — Patient Instructions (Addendum)
YOU HAD AN ENDOSCOPIC PROCEDURE TODAY AT [facility]:   Refer to the procedure report that was given to you for any specific questions about what was found during the examination.  If the procedure report does not answer your questions, please call your gastroenterologist to clarify.  If you requested that your care partner not be given the details of your procedure findings, then the procedure report has been included in a sealed envelope for you to review at your convenience later.  YOU SHOULD EXPECT: Some feelings of bloating in the abdomen. Passage of more gas than usual.  Walking can help get rid of the air that was put into your GI tract during the procedure and reduce the bloating. If you had a lower endoscopy (such as a colonoscopy or flexible sigmoidoscopy) you may notice spotting of blood in your stool or on the toilet paper. If you underwent a bowel prep for your procedure, you may not have a normal bowel movement for a few days.  Please Note:  You might notice some irritation and congestion in your nose or some drainage.  This is from the oxygen used during your procedure.  There is no need for concern and it should clear up in a day or so.  SYMPTOMS TO REPORT IMMEDIATELY:   Following lower endoscopy (colonoscopy or flexible sigmoidoscopy):  Excessive amounts of blood in the stool  Significant tenderness or worsening of abdominal pains  Swelling of the abdomen that is new, acute  Fever of 100F or higher  For urgent or emergent issues, a gastroenterologist can be reached at any hour by calling [phone].   DIET: Your first meal following the procedure should be a small meal and then it is ok to progress to your normal diet. Heavy or fried foods are harder to digest and may make you feel nauseous or bloated.  Likewise, meals heavy in dairy and vegetables can increase bloating.  Drink plenty of fluids but you should avoid alcoholic beverages for 24  hours.  ACTIVITY:  You should plan to take it easy for the rest of today and you should NOT DRIVE or use heavy machinery until tomorrow (because of the sedation medicines used during the test).    FOLLOW UP: Our staff will call the number listed on your records the next business day following your procedure to check on you and address any questions or concerns that you may have regarding the information given to you following your procedure. If we do not reach you, we will leave a message.  However, if you are feeling well and you are not experiencing any problems, there is no need to return our call.  We will assume that you have returned to your regular daily activities without incident.  If any biopsies were taken you will be contacted by phone or by letter within the next 1-3 weeks.  Please call us at [phone] if you have not heard about the biopsies in 3 weeks.    SIGNATURES/CONFIDENTIALITY: You and/or your care partner have signed paperwork which will be entered into your electronic medical record.  These signatures attest to the fact that that the information above on your After Visit Summary has been reviewed and is understood.  Full responsibility of the confidentiality of this discharge information lies with you and/or your care-partner.  Please review polyp, diverticulosis, and high fiber diet, handouts provided. No aspirin, ibuprofen, naproxen, aleve, or other non-steroidal anti-inflammatory drugs for 2 weeks after polyp  removal. Next colonoscopy 3 years.

## 2015-12-26 ENCOUNTER — Telehealth: Payer: Self-pay | Admitting: Internal Medicine

## 2015-12-26 ENCOUNTER — Encounter (HOSPITAL_COMMUNITY): Payer: Self-pay

## 2015-12-26 ENCOUNTER — Ambulatory Visit
Admission: RE | Admit: 2015-12-26 | Discharge: 2015-12-26 | Disposition: A | Discharge status: Home / Self Care | Payer: Commercial Managed Care - HMO | Source: Ambulatory Visit | Attending: Internal Medicine | Admitting: Internal Medicine

## 2015-12-26 ENCOUNTER — Emergency Department (HOSPITAL_COMMUNITY)
Admission: EM | Admit: 2015-12-26 | Discharge: 2015-12-27 | Disposition: A | Discharge status: Home / Self Care | Payer: Commercial Managed Care - HMO | Attending: Emergency Medicine | Admitting: Emergency Medicine

## 2015-12-26 ENCOUNTER — Telehealth: Payer: Self-pay | Admitting: *Deleted

## 2015-12-26 DIAGNOSIS — E039 Hypothyroidism, unspecified: Secondary | ICD-10-CM | POA: Diagnosis not present

## 2015-12-26 DIAGNOSIS — M8580 Other specified disorders of bone density and structure, unspecified site: Secondary | ICD-10-CM

## 2015-12-26 DIAGNOSIS — E785 Hyperlipidemia, unspecified: Secondary | ICD-10-CM | POA: Diagnosis not present

## 2015-12-26 DIAGNOSIS — R109 Unspecified abdominal pain: Secondary | ICD-10-CM

## 2015-12-26 DIAGNOSIS — Z79899 Other long term (current) drug therapy: Secondary | ICD-10-CM | POA: Insufficient documentation

## 2015-12-26 DIAGNOSIS — M85852 Other specified disorders of bone density and structure, left thigh: Secondary | ICD-10-CM | POA: Diagnosis not present

## 2015-12-26 DIAGNOSIS — R1010 Upper abdominal pain, unspecified: Secondary | ICD-10-CM | POA: Diagnosis present

## 2015-12-26 DIAGNOSIS — M1990 Unspecified osteoarthritis, unspecified site: Secondary | ICD-10-CM | POA: Diagnosis not present

## 2015-12-26 DIAGNOSIS — N390 Urinary tract infection, site not specified: Secondary | ICD-10-CM | POA: Diagnosis not present

## 2015-12-26 DIAGNOSIS — I10 Essential (primary) hypertension: Secondary | ICD-10-CM | POA: Diagnosis not present

## 2015-12-26 DIAGNOSIS — R140 Abdominal distension (gaseous): Secondary | ICD-10-CM | POA: Insufficient documentation

## 2015-12-26 DIAGNOSIS — M8588 Other specified disorders of bone density and structure, other site: Secondary | ICD-10-CM | POA: Insufficient documentation

## 2015-12-26 DIAGNOSIS — R1013 Epigastric pain: Secondary | ICD-10-CM | POA: Diagnosis not present

## 2015-12-26 DIAGNOSIS — K219 Gastro-esophageal reflux disease without esophagitis: Secondary | ICD-10-CM | POA: Diagnosis not present

## 2015-12-26 DIAGNOSIS — Z8669 Personal history of other diseases of the nervous system and sense organs: Secondary | ICD-10-CM | POA: Insufficient documentation

## 2015-12-26 DIAGNOSIS — R110 Nausea: Secondary | ICD-10-CM | POA: Diagnosis not present

## 2015-12-26 DIAGNOSIS — Z7951 Long term (current) use of inhaled steroids: Secondary | ICD-10-CM | POA: Insufficient documentation

## 2015-12-26 DIAGNOSIS — M8589 Other specified disorders of bone density and structure, multiple sites: Secondary | ICD-10-CM | POA: Diagnosis not present

## 2015-12-26 DIAGNOSIS — K2970 Gastritis, unspecified, without bleeding: Secondary | ICD-10-CM | POA: Diagnosis not present

## 2015-12-26 DIAGNOSIS — Z780 Asymptomatic menopausal state: Secondary | ICD-10-CM | POA: Diagnosis not present

## 2015-12-26 MED ORDER — ONDANSETRON HCL 4 MG/2ML IJ SOLN
4.0000 mg | Freq: Once | INTRAMUSCULAR | Status: DC | PRN
  Filled 2015-12-26: qty 2

## 2015-12-26 NOTE — Telephone Encounter (Signed)
  Follow up Call-  Call back number 12/25/2015  Post procedure Call Back phone  # [phone]  Permission to leave phone message Yes     Patient questions:  Do you have a fever, pain , or abdominal swelling? No. Pain Score  0 *  Have you tolerated food without any problems? Yes.    Have you been able to return to your normal activities? Yes.    Do you have any questions about your discharge instructions: Diet   No. Medications  No. Follow up visit  No.  Do you have questions or concerns about your Care? No.  Actions: * If pain score is 4 or above: No action needed, pain <4.

## 2015-12-26 NOTE — ED Notes (Signed)
Per EMS pt had  Normal colonoscopy on 12/25/15; Pt was up walking today for about 2 miles and started having upper abdominal pain that felt like stabbing when she returned; pt states she noticed abnormal bloating to abdomen; pt also c/o nausea; Pt from home; pt a&ox 4 on arrival.

## 2015-12-26 NOTE — Telephone Encounter (Signed)
Colonoscopy and hot/cold snare polypectomy yesterday AM Ok until 1 hr ago with severe crampy RUQ pain into back. Belched and passed some flatus and a bit better. No fever. No bleeding. Has not had stool yet today. Walked a lot today .  No vomiting.,  Advised try gas ex, stay on warm clears tonight and if no better, worse, fever, bleeding, vomiting - go to closest ED

## 2015-12-27 ENCOUNTER — Telehealth: Payer: Self-pay | Admitting: Gastroenterology

## 2015-12-27 ENCOUNTER — Emergency Department (HOSPITAL_COMMUNITY): Payer: Commercial Managed Care - HMO

## 2015-12-27 DIAGNOSIS — R1013 Epigastric pain: Secondary | ICD-10-CM | POA: Diagnosis not present

## 2015-12-27 LAB — COMPREHENSIVE METABOLIC PANEL
ALBUMIN: 3.7 g/dL (ref 3.5–5.0)
ALT: 17 U/L (ref 14–54)
AST: 22 U/L (ref 15–41)
Alkaline Phosphatase: 90 U/L (ref 38–126)
Anion gap: 11 (ref 5–15)
BILIRUBIN TOTAL: 0.4 mg/dL (ref 0.3–1.2)
BUN: 7 mg/dL (ref 6–20)
CO2: 25 mmol/L (ref 22–32)
CREATININE: 0.62 mg/dL (ref 0.44–1.00)
Calcium: 9.2 mg/dL (ref 8.9–10.3)
Chloride: 101 mmol/L (ref 101–111)
GFR calc Af Amer: >60 mL/min (ref >60)
GLUCOSE: 140 mg/dL — AB (ref 65–99)
POTASSIUM: 3.4 mmol/L — AB (ref 3.5–5.1)
Sodium: 137 mmol/L (ref 135–145)
TOTAL PROTEIN: 6.7 g/dL (ref 6.5–8.1)

## 2015-12-27 LAB — URINALYSIS, ROUTINE W REFLEX MICROSCOPIC
BILIRUBIN URINE: NEGATIVE
GLUCOSE, UA: 100 mg/dL — AB
Hgb urine dipstick: NEGATIVE
KETONES UR: NEGATIVE mg/dL
NITRITE: POSITIVE — AB
PH: 7 (ref 5.0–8.0)
PROTEIN: NEGATIVE mg/dL
Specific Gravity, Urine: 1.013 (ref 1.005–1.030)

## 2015-12-27 LAB — CBC
HEMATOCRIT: 40.1 % (ref 36.0–46.0)
Hemoglobin: 13.8 g/dL (ref 12.0–15.0)
MCH: 30.3 pg (ref 26.0–34.0)
MCHC: 34.4 g/dL (ref 30.0–36.0)
MCV: 87.9 fL (ref 78.0–100.0)
PLATELETS: 250 10*3/uL (ref 150–400)
RBC: 4.56 MIL/uL (ref 3.87–5.11)
RDW: 12.6 % (ref 11.5–15.5)
WBC: 9.7 10*3/uL (ref 4.0–10.5)

## 2015-12-27 LAB — URINE MICROSCOPIC-ADD ON

## 2015-12-27 LAB — LIPASE, BLOOD: Lipase: 34 U/L (ref 11–51)

## 2015-12-27 MED ORDER — DEXTROSE 5 % IV SOLN
INTRAVENOUS | Status: AC
  Administered 2015-12-27: 1 g
  Filled 2015-12-27: qty 10

## 2015-12-27 MED ORDER — GI COCKTAIL ~~LOC~~
ORAL | Status: AC
  Administered 2015-12-27: 30 mL
  Filled 2015-12-27: qty 30

## 2015-12-27 MED ORDER — CEPHALEXIN 500 MG PO CAPS: 500.0000 mg | ORAL_CAPSULE | Freq: Two times a day (BID) | ORAL | Status: DC

## 2015-12-27 MED ORDER — FENTANYL CITRATE (PF) 100 MCG/2ML IJ SOLN
INTRAMUSCULAR | Status: DC
Start: 2015-12-27 — End: 2015-12-27
  Filled 2015-12-27: qty 2

## 2015-12-27 MED ORDER — SIMETHICONE 80 MG PO CHEW
80.0000 mg | CHEWABLE_TABLET | Freq: Once | ORAL | Status: AC
  Administered 2015-12-27: 80 mg via ORAL
  Filled 2015-12-27: qty 1

## 2015-12-27 NOTE — Telephone Encounter (Signed)
Patient notified.  She knows to call back for continued symptoms

## 2015-12-27 NOTE — Telephone Encounter (Signed)
I reviewed ED evaluation including blood work and UA. Complete entire course of antibiotics as prescribed. Light diet and light activity until symptoms resolve. UTI and some colon air retention post colonoscopy could have caused all her symptoms. Call if not better in next 2-3 days.

## 2015-12-27 NOTE — ED Notes (Signed)
See [facility] notes for prior care

## 2015-12-27 NOTE — Discharge Instructions (Signed)
Abdominal Pain, Adult °Many things can cause abdominal pain. Usually, abdominal pain is not caused by a disease and will improve without treatment. It can often be observed and treated at home. Your health care provider will do a physical exam and possibly order blood tests and X-rays to help determine the seriousness of your pain. However, in many cases, more time must pass before a clear cause of the pain can be found. Before that point, your health care provider may not know if you need more testing or further treatment. °HOME CARE INSTRUCTIONS °Monitor your abdominal pain for any changes. The following actions may help to alleviate any discomfort you are experiencing: °· Only take over-the-counter or prescription medicines as directed by your health care provider. °· Do not take laxatives unless directed to do so by your health care provider. °· Try a clear liquid diet (broth, tea, or water) as directed by your health care provider. Slowly move to a bland diet as tolerated. °SEEK MEDICAL CARE IF: °· You have unexplained abdominal pain. °· You have abdominal pain associated with nausea or diarrhea. °· You have pain when you urinate or have a bowel movement. °· You experience abdominal pain that wakes you in the night. °· You have abdominal pain that is worsened or improved by eating food. °· You have abdominal pain that is worsened with eating fatty foods. °· You have a fever. °SEEK IMMEDIATE MEDICAL CARE IF: °· Your pain does not go away within 2 hours. °· You keep throwing up (vomiting). °· Your pain is felt only in portions of the abdomen, such as the right side or the left lower portion of the abdomen. °· You pass bloody or black tarry stools. °MAKE SURE YOU: °· Understand these instructions. °· Will watch your condition. °· Will get help right away if you are not doing well or get worse. °  °This information is not intended to replace advice given to you by your health care provider. Make sure you discuss  any questions you have with your health care provider. °  °Document Released: 05/08/2005 Document Revised: 04/19/2015 Document Reviewed: 04/07/2013 °Elsevier Interactive Patient Education ©2016 [employer] °Urinary Tract Infection °Urinary tract infections (UTIs) can develop anywhere along your urinary tract. Your urinary tract is your body's drainage system for removing wastes and extra water. Your urinary tract includes two kidneys, two ureters, a bladder, and a urethra. Your kidneys are a pair of bean-shaped organs. Each kidney is about the size of your fist. They are located below your ribs, one on each side of your spine. °CAUSES °Infections are caused by microbes, which are microscopic organisms, including fungi, viruses, and bacteria. These organisms are so small that they can only be seen through a microscope. Bacteria are the microbes that most commonly cause UTIs. °SYMPTOMS  °Symptoms of UTIs may vary by age and gender of the patient and by the location of the infection. Symptoms in young women typically include a frequent and intense urge to urinate and a painful, burning feeling in the bladder or urethra during urination. Older women and men are more likely to be tired, shaky, and weak and have muscle aches and abdominal pain. A fever may mean the infection is in your kidneys. Other symptoms of a kidney infection include pain in your back or sides below the ribs, nausea, and vomiting. °DIAGNOSIS °To diagnose a UTI, your caregiver will ask you about your symptoms. Your caregiver will also ask you to provide a urine sample. The   urine sample will be tested for bacteria and white blood cells. White blood cells are made by your body to help fight infection. °TREATMENT  °Typically, UTIs can be treated with medication. Because most UTIs are caused by a bacterial infection, they usually can be treated with the use of antibiotics. The choice of antibiotic and length of treatment depend on your symptoms and the  type of bacteria causing your infection. °HOME CARE INSTRUCTIONS °· If you were prescribed antibiotics, take them exactly as your caregiver instructs you. Finish the medication even if you feel better after you have only taken some of the medication. °· Drink enough water and fluids to keep your urine clear or pale yellow. °· Avoid caffeine, tea, and carbonated beverages. They tend to irritate your bladder. °· Empty your bladder often. Avoid holding urine for long periods of time. °· Empty your bladder before and after sexual intercourse. °· After a bowel movement, women should cleanse from front to back. Use each tissue only once. °SEEK MEDICAL CARE IF:  °· You have back pain. °· You develop a fever. °· Your symptoms do not begin to resolve within 3 days. °SEEK IMMEDIATE MEDICAL CARE IF:  °· You have severe back pain or lower abdominal pain. °· You develop chills. °· You have nausea or vomiting. °· You have continued burning or discomfort with urination. °MAKE SURE YOU:  °· Understand these instructions. °· Will watch your condition. °· Will get help right away if you are not doing well or get worse. °  °This information is not intended to replace advice given to you by your health care provider. Make sure you discuss any questions you have with your health care provider. °  °Document Released: 05/08/2005 Document Revised: 04/19/2015 Document Reviewed: 09/06/2011 °Elsevier Interactive Patient Education ©2016 [employer] ° °

## 2015-12-27 NOTE — ED Provider Notes (Signed)
CSN: [id number]     Arrival date & time 12/26/15  2351 History  By signing my name below, I, Altamease Oiler, attest that this documentation has been prepared under the direction and in the presence of Quintella Reichert, MD. Electronically Signed: Altamease Oiler, ED Scribe. 12/27/2015. 3:17 AM   Chief Complaint  Patient presents with  . Abdominal Pain    The history is provided by the patient. No language interpreter was used.   Crystal Kidd is a 70 y.o. female with PMHx of diverticulosis, GERD, HTN, HLD, and thyroid disease who presents to the Emergency Department complaining of new, constant, "hard" upper abdominal pain with sudden onset approximately 4 hours ago. The pain radiates to the back. She had a colonoscopy yesterday and 4 polyps were removed. Tonight she called her doctor who told her to come to the ED if the pain worsened. Associated symptoms include nausea and a small amount of vomiting. Pt denies fever. She has not had a bowel movement since the procedure yesterday but has been able to pass gas. Pt states that she allergic to codeine and Dilaudid.   Past Medical History  Diagnosis Date  . Diverticulosis   . GERD (gastroesophageal reflux disease)     PMH of esophageal stricture  . Allergic rhinoconjunctivitis   . Hyperlipidemia   . Hypertension   . Thyroid disease     hypothyroidism  . Vitiligo   . Osteopenia     BMD @[facility]   . Transfusion history 1968    post partum  . Allergy 1992  . Arthritis   . Cataract   . Trapezius muscle spasm 04/13/2015   Past Surgical History  Procedure Laterality Date  . Rotator cuff repair      R shoulder  . Abdominal hysterectomy      for dysfunctional menses; USO with TAH  . Breast surgery      biopsy X 2  . Appendectomy    . Esophageal dilation      X2  . Colonoscopy      X2; Tics; Dr Fuller Plan, GI  . Eye surgery    . Fracture surgery  2003or 2004  . Tubal ligation      befor hysterectomy  . Upper gastrointestinal endoscopy      Family History  Problem Relation Age of Onset  . Breast cancer Sister     mammograms @ [facility]  . Colon polyps Sister   . Colon cancer Maternal Aunt   . Cancer Maternal Aunt 80    colon cA  . COPD Father   . Coronary artery disease Brother   . COPD Brother   . Colon polyps Brother   . Diabetes Neg Hx   . Stroke Neg Hx   . Heart disease Neg Hx   . Esophageal cancer Neg Hx   . Rectal cancer Neg Hx   . Cancer Sister 31    BREAST  . Cancer Mother     breast&stomach  . Stomach cancer Mother   . Breast cancer Mother   . COPD Son    Social History  Substance Use Topics  . Smoking status: Never Smoker   . Smokeless tobacco: Never Used  . Alcohol Use: Yes    1-6 Glasses of wine per week     Comment: have wine 1 glass every 6 month   OB History    No data available     Review of Systems  All other systems reviewed and are negative.  Allergies  Besivance; Alendronate sodium; Dilaudid; and Ibandronate sodium  Home Medications   Prior to Admission medications   Medication Sig Start Date End Date Taking? Authorizing Provider  baclofen (LIORESAL) 10 MG tablet Take 0.5 tablets (5 mg total) by mouth 3 (three) times daily as needed for muscle spasms. Patient not taking: Reported on 12/25/2015 04/12/15   Leone Haven, MD  calcium carbonate (TUMS) 500 MG chewable tablet Chew 1 tablet by mouth as needed.     Historical Provider, MD  cephALEXin (KEFLEX) 500 MG capsule Take 1 capsule (500 mg total) by mouth 2 (two) times daily. 12/27/15   Quintella Reichert, MD  Cholecalciferol (VITAMIN D3) 2000 UNITS TABS Take 2,000 Units by mouth daily. Reported on 12/11/2015    Historical Provider, MD  [place] Take by mouth.      Historical Provider, MD  fluticasone (FLONASE) 50 MCG/ACT nasal spray Place 2 sprays into both nostrils daily. 09/02/13   Hendricks Limes, MD  gabapentin (NEURONTIN) 100 MG capsule TAKE 1 CAPSULE (100 MG TOTAL) BY MOUTH 3 (THREE) TIMES DAILY. EVERY 8  HOURS AS NEEDED 06/17/15   [name], MD  hydrochlorothiazide (HYDRODIURIL) 25 MG tablet TAKE 1/2 TABLET DAILY AS NEEDED 08/21/15   [name], MD  levothyroxine (SYNTHROID, LEVOTHROID) 50 MCG tablet TAKE 1 TABLET DAILY EXCEPT TAKE 1 AND 1/2 TABLETS (75MCG) MONDAY, [facility] AND FRIDAY AS DIRECTED 05/25/15   [name], MD  loratadine (CLARITIN) 10 MG tablet Take 10 mg by mouth daily.    Historical Provider, MD  losartan (COZAAR) 50 MG tablet TAKE 1 AND 1/2 TABLETS EVERY DAY (CALL OFFICE FOR A NURSE BLOOD PRESSURE CHECK) 09/26/15   [name], MD  Multiple Vitamin (MULTIVITAMIN) tablet Take 2 tablets by mouth daily.     Historical Provider, MD  omeprazole (PRILOSEC) 40 MG capsule TAKE 1 CAPSULE EVERY DAY 09/06/15   [name], MD  promethazine (PHENERGAN) 12.5 MG tablet Take 1 tablet (12.5 mg total) by mouth every 8 (eight) hours as needed for nausea or vomiting. 05/26/15   [name], MD  simvastatin (ZOCOR) 20 MG tablet TAKE 1 TABLET EVERY EVENING 11/07/15   [name], MD   BP 123/67 mmHg  Pulse 70  Temp(Src) 97.8 F (36.6 C) (Oral)  Resp 20  Ht 5\' 2"  (1.575 m)  Wt 140 lb (63.504 kg)  BMI 25.60 kg/m2  SpO2 95% Physical Exam  Constitutional: She is oriented to person, place, and time. She appears well-developed and well-nourished.  HENT:  Head: Normocephalic and atraumatic.  Cardiovascular: Normal rate and regular rhythm.   No murmur heard. Pulmonary/Chest: Effort normal and breath sounds normal. No respiratory distress.  Abdominal: Soft. Bowel sounds are normal. There is tenderness. There is no rebound and no guarding.  Mild upper abdominal TTP Mild upper abdominal bloating  Musculoskeletal: She exhibits no edema or tenderness.  Neurological: She is alert and oriented to person, place, and time.  Skin: Skin is warm and dry.  Psychiatric: She has a normal mood and affect. Her behavior is normal.  Nursing note and vitals reviewed.   ED Course  Procedures  (including critical care time) DIAGNOSTIC STUDIES: Oxygen Saturation is 95% on RA,  normal by my interpretation.    COORDINATION OF CARE: 12:28 AM Discussed treatment plan which includes lab work and IVF with pt at bedside and pt agreed to plan.  Labs Review Labs Reviewed  COMPREHENSIVE METABOLIC PANEL - Abnormal; Notable for  the following:    Potassium 3.4 (*)    Glucose, Bld 140 (*)    All other components within normal limits  URINALYSIS, ROUTINE W REFLEX MICROSCOPIC (NOT AT [facility]) - Abnormal; Notable for the following:    Color, Urine STRAW (*)    APPearance CLOUDY (*)    Glucose, UA 100 (*)    Nitrite POSITIVE (*)    Leukocytes, UA SMALL (*)    All other components within normal limits  URINE MICROSCOPIC-ADD ON - Abnormal; Notable for the following:    Squamous Epithelial / LPF 0-5 (*)    Bacteria, UA MANY (*)    All other components within normal limits  LIPASE, BLOOD  CBC    Imaging Review Dg Bone Density  12/26/2015  EXAM: DUAL X-RAY ABSORPTIOMETRY (DXA) FOR BONE MINERAL DENSITY IMPRESSION: Dear Dr. Derrel Nip, Your patient Zettie Schult completed a BMD test on 12/26/2015 using the [place] (analysis version: 14.10) manufactured by [employer]. The following summarizes the results of our evaluation. PATIENT BIOGRAPHICAL: Name: Jordin, Sinon Patient ID: [id number] Birth Date: 1945-09-19 Height: 62.0 in. Gender: Female Exam Date: 12/26/2015 Weight: 142.0 lbs. Indications: Advanced Age, Caucasian, Early Menopause, Family History of Fracture, History of Fracture (Adult), Hysterectomy, neuropathy, Oophorectomy Bilateral, Postmenopausal Fractures: Right ankle Treatments: CALCIUM VIT D, gabapentin, Multi-Vitamin with calcium, omeprazole, simvastatin, zyrtec ASSESSMENT: The BMD measured at Femur Neck Left is 0.710 g/cm2 with a T-score of -2.4. This patient is considered osteopenic according to [facility]) criteria. Site Region Measured Measured WHO Young  Adult BMD Date       Age      Classification T-score AP Spine L1-L4 12/26/2015 70.5 Osteopenia -1.3 1.035 g/cm2 DualFemur Neck Left 12/26/2015 70.5 Osteopenia -2.4 0.710 g/cm2 World Health [facility]) criteria for post-menopausal, Caucasian Women: Normal:       T-score at or above -1 SD Osteopenia:   T-score between -1 and -2.5 SD Osteoporosis: T-score at or below -2.5 SD RECOMMENDATIONS: [place] recommends that FDA-approved medical therapies be considered in postmenopausal women and men age 34 or older with a: 1. Hip or vertebral (clinical or morphometric) fracture. 2. T-score of < -2.5 at the spine or hip. 3. Ten-year fracture probability by FRAX of 3% or greater for hip fracture or 20% or greater for major osteoporotic fracture. All treatment decisions require clinical judgment and consideration of individual patient factors, including patient preferences, co-morbidities, previous drug use, risk factors not captured in the FRAX model (e.g. falls, vitamin D deficiency, increased bone turnover, interval significant decline in bone density) and possible under - or over-estimation of fracture risk by FRAX. All patients should ensure an adequate intake of dietary calcium (1200 mg/d) and vitamin D (800 IU daily) unless contraindicated. FOLLOW-UP: People with diagnosed cases of osteoporosis or at high risk for fracture should have regular bone mineral density tests. For patients eligible for Medicare, routine testing is allowed once every 2 years. The testing frequency can be increased to one year for patients who have rapidly progressing disease, those who are receiving or discontinuing medical therapy to restore bone mass, or have additional risk factors. I have reviewed this report, and agree with the above findings. [facility]iology Dear Dr. Derrel Nip, Your patient [name] completed a FRAX assessment on 12/26/2015 using the [place] (analysis version: 14.10)  manufactured by [employer]. The following summarizes the results of our evaluation. PATIENT BIOGRAPHICAL: Name: Shacora, Hallums Patient ID: [id number] Birth Date: Mar 05, 1946  Height:    62.0 in. Gender:     Female    [age]       Weight:    142.0 lbs. Ethnicity:  White                            Exam Date: 12/26/2015 FRAX* RESULTS:  (version: 3.5) 10-year Probability of Fracture1 Major Osteoporotic Fracture2 Hip Fracture 22.1% 5.3% Population: [place] (Caucasian) Risk Factors: History of Fracture (Adult) Based on Femur (Left) Neck BMD 1 -The 10-year probability of fracture may be lower than reported if the patient has received treatment. 2 -Major Osteoporotic Fracture: Clinical Spine, Forearm, Hip or Shoulder *FRAX is a Materials engineer of the [employer] for Metabolic Bone Disease, a [place] (WHO) [facility]. ASSESSMENT: The probability of a major osteoporotic fracture is 22.1% within the next ten years. The probability of a hip fracture is 5.3% within the next ten years. . Electronically Signed   By: David  Martinique M.D.   On: 12/26/2015 11:44   Dg Abd Acute W/chest  12/27/2015  CLINICAL DATA:  Epigastric pain with bloating and nausea tonight. EXAM: DG ABDOMEN ACUTE W/ 1V CHEST COMPARISON:  Chest 11/25/2003 FINDINGS: Normal heart size and pulmonary vascularity. Slight interstitial pattern to the lungs is probably due to fibrosis. No focal airspace disease or consolidation in the lungs. No blunting of costophrenic angles. No pneumothorax. Calcification of the aorta. Degenerative changes in the spine and shoulders. Scattered gas and stool in the colon. No small or large bowel distention. No free intra-abdominal air. No abnormal air-fluid levels. No radiopaque stones. Calcified granulomas in the spleen. Visualized bones appear intact. IMPRESSION: No evidence of active pulmonary disease. Normal nonobstructive bowel gas pattern. Electronically Signed    By: Lucienne Capers M.D.   On: 12/27/2015 04:09   I have personally reviewed and evaluated these images and lab results as part of my medical decision-making.   EKG Interpretation None      MDM   Final diagnoses:  Abdominal bloating  UTI (lower urinary tract infection)   Patient here for abdominal bloating and pain following colonoscopy procedure yesterday. She has a benign abdominal examination and after treatment in emergency department her pain is significantly improved. Presentation is not consistent with perforation. Discussed with patient postprocedural bloating with home care. UAs concerning for UTI, will treat with Keflex. Discussed him care for UTI as well as abdominal bloating, outpatient follow-up, return options.  I personally performed the services described in this documentation, which was scribed in my presence. The recorded information has been reviewed and is accurate.    Quintella Reichert, MD 12/27/15 0730

## 2015-12-27 NOTE — Telephone Encounter (Signed)
Patient was treated in the ED last night for  She was having abdominal pain and excess gas following colonoscopy on 12/25/15 with polypectomy.  Perforation was ruled out.  She was also treated for a UTI.Marland Kitchen She is feeling better today, symptoms  have resolved but she is weak.  She was asked to let Dr. Fuller Plan know about ED visit.  She is advised to rest with light activity and soft bland diet for 24 hours.  She will call back if symptoms worsen.

## 2015-12-28 ENCOUNTER — Encounter: Payer: Self-pay | Admitting: Internal Medicine

## 2016-01-01 ENCOUNTER — Encounter: Payer: Self-pay | Admitting: Internal Medicine

## 2016-01-01 ENCOUNTER — Ambulatory Visit (INDEPENDENT_AMBULATORY_CARE_PROVIDER_SITE_OTHER): Payer: Commercial Managed Care - HMO | Admitting: Internal Medicine

## 2016-01-01 ENCOUNTER — Encounter: Payer: Self-pay | Admitting: Gastroenterology

## 2016-01-01 ENCOUNTER — Telehealth: Payer: Self-pay | Admitting: Gastroenterology

## 2016-01-01 VITALS — BP 128/84 | HR 98 | Temp 98.1°F | Resp 12 | Ht 63.0 in | Wt 138.2 lb

## 2016-01-01 DIAGNOSIS — R300 Dysuria: Secondary | ICD-10-CM | POA: Diagnosis not present

## 2016-01-01 DIAGNOSIS — K567 Ileus, unspecified: Secondary | ICD-10-CM | POA: Diagnosis not present

## 2016-01-01 DIAGNOSIS — I700 Atherosclerosis of aorta: Secondary | ICD-10-CM | POA: Diagnosis not present

## 2016-01-01 DIAGNOSIS — N3000 Acute cystitis without hematuria: Secondary | ICD-10-CM | POA: Diagnosis not present

## 2016-01-01 NOTE — Progress Notes (Signed)
Pre-visit discussion using our clinic review tool. No additional management support is needed unless otherwise documented below in the visit note.  

## 2016-01-01 NOTE — Telephone Encounter (Signed)
Constipation, bloating for several days. No BM since 5/18. Generalized abd pain started last night with vomiting. Take phenergan 12.5-25 mg now. Clear liquids today. Take 1 Ducolax tablet if no vomiting for 1 hour post Phenergan. If no BM or vomiting or pain persists this morning call back or go to ED for evaluation.

## 2016-01-01 NOTE — Progress Notes (Signed)
Subjective:  Patient ID: Crystal Kidd, female    DOB: 20-Apr-1946  Age: 70 y.o. MRN: [id number]  CC: The primary encounter diagnosis was Dysuria. Diagnoses of Ileus ([address]), Acute cystitis without hematuria, and Thoracic aortic atherosclerosis ([place]) were also pertinent to this visit.  HPI Crystal Kidd presents for evaluation of symptoms of nausea and vomiting  and ER follow up.  Patient underwent screening colonoscopy on May 15 at [place]. Felt fine afterward,  Was passing gas but on May 16 developed sudden onset of abdominal distension, and pain accompanied by nausea.  Symptoms persisted  for several hours and progressed to include back pain and anorexia so she followed instructions by GI to go to ER. On May 17.   After evaluation by ER physician with 3 way abd films and UA  to rule out perforation  Ileus, and UTI, she was told she had a UTI,  Was given meds for nausea and IV abx and sent home with rx for cephalexin 500 mg bid.   Took the antibiotic and phenergan as directed, drank a liquid diet  and on May 20,   Started having nausea and vomiting, which persisted until this morning. No BM from May 16 until this morning, but since this morning has had a solid stool after taking dulcolax last evening per direction from on call GI   Nausea and vomiting are currently resolved.         Outpatient Prescriptions Prior to Visit  Medication Sig Dispense Refill  . baclofen (LIORESAL) 10 MG tablet Take 0.5 tablets (5 mg total) by mouth 3 (three) times daily as needed for muscle spasms. 30 each 0  . calcium carbonate (TUMS) 500 MG chewable tablet Chew 1 tablet by mouth as needed.     . cephALEXin (KEFLEX) 500 MG capsule Take 1 capsule (500 mg total) by mouth 2 (two) times daily. 20 capsule 0  . FIBER SELECT GUMMIES PO Take by mouth.      . fluticasone (FLONASE) 50 MCG/ACT nasal spray Place 2 sprays into both nostrils daily. 16 g 6  . gabapentin (NEURONTIN) 100 MG capsule TAKE 1 CAPSULE (100 MG TOTAL) BY  MOUTH 3 (THREE) TIMES DAILY. EVERY 8 HOURS AS NEEDED 90 capsule 3  . hydrochlorothiazide (HYDRODIURIL) 25 MG tablet TAKE 1/2 TABLET DAILY AS NEEDED 45 tablet 1  . levothyroxine (SYNTHROID, LEVOTHROID) 50 MCG tablet TAKE 1 TABLET DAILY EXCEPT TAKE 1 AND 1/2 TABLETS (75MCG) MONDAY, WEDNESDAY AND FRIDAY AS DIRECTED 108 tablet 2  . loratadine (CLARITIN) 10 MG tablet Take 10 mg by mouth daily.    Marland Kitchen losartan (COZAAR) 50 MG tablet TAKE 1 AND 1/2 TABLETS EVERY DAY (CALL OFFICE FOR A NURSE BLOOD PRESSURE CHECK) 135 tablet 1  . Multiple Vitamin (MULTIVITAMIN) tablet Take 2 tablets by mouth daily.     Marland Kitchen omeprazole (PRILOSEC) 40 MG capsule TAKE 1 CAPSULE EVERY DAY 90 capsule 3  . promethazine (PHENERGAN) 12.5 MG tablet Take 1 tablet (12.5 mg total) by mouth every 8 (eight) hours as needed for nausea or vomiting. 20 tablet 0  . simvastatin (ZOCOR) 20 MG tablet TAKE 1 TABLET EVERY EVENING 90 tablet 1  . Cholecalciferol (VITAMIN D3) 2000 UNITS TABS Take 2,000 Units by mouth daily. Reported on 01/01/2016     No facility-administered medications prior to visit.    Review of Systems;  Patient denies headache, fevers, malaise, unintentional weight loss, skin rash, eye pain, sinus congestion and sinus pain, sore throat, dysphagia,  hemoptysis ,  cough, dyspnea, wheezing, chest pain, palpitations, orthopnea, edema, abdominal pain, nausea, melena, diarrhea, constipation, flank pain, dysuria, hematuria, urinary  Frequency, nocturia, numbness, tingling, seizures,  Focal weakness, Loss of consciousness,  Tremor, insomnia, depression, anxiety, and suicidal ideation.      Objective:  BP 128/84 mmHg  Pulse 98  Temp(Src) 98.1 F (36.7 C) (Oral)  Resp 12  Ht 5\' 3"  (1.6 m)  Wt 138 lb 4 oz (62.71 kg)  BMI 24.50 kg/m2  SpO2 96%  BP Readings from Last 3 Encounters:  01/01/16 128/84  12/27/15 135/69  12/25/15 140/83    Wt Readings from Last 3 Encounters:  01/01/16 138 lb 4 oz (62.71 kg)  12/26/15 140 lb (63.504  kg)  12/25/15 142 lb (64.411 kg)    General appearance: alert, cooperative and appears stated age Ears: normal TM's and external ear canals both ears Throat: lips, mucosa, and tongue normal; teeth and gums normal Neck: no adenopathy, no carotid bruit, supple, symmetrical, trachea midline and thyroid not enlarged, symmetric, no tenderness/mass/nodules Back: symmetric, no curvature. ROM normal. No CVA tenderness. Lungs: clear to auscultation bilaterally Heart: regular rate and rhythm, S1, S2 normal, no murmur, click, rub or gallop Abdomen: soft, non-tender; bowel sounds quiet/hypoactive but present ; no masses,  no organomegaly Pulses: 2+ and symmetric Skin: Skin color, texture, turgor normal. No rashes or lesions Lymph nodes: Cervical, supraclavicular, and axillary nodes normal.  Lab Results  Component Value Date   HGBA1C 5.6 08/25/2012   HGBA1C 5.6 05/05/2008   HGBA1C 5.7 01/07/2007    Lab Results  Component Value Date   CREATININE 0.62 12/27/2015   CREATININE 0.71 11/30/2015   CREATININE 0.75 05/26/2015    Lab Results  Component Value Date   WBC 9.7 12/27/2015   HGB 13.8 12/27/2015   HCT 40.1 12/27/2015   PLT 250 12/27/2015   GLUCOSE 140* 12/27/2015   CHOL 141 11/30/2015   TRIG 125.0 11/30/2015   HDL 44.20 11/30/2015   LDLDIRECT 79.0 11/30/2015   LDLCALC 72 11/30/2015   ALT 17 12/27/2015   AST 22 12/27/2015   NA 137 12/27/2015   K 3.4* 12/27/2015   CL 101 12/27/2015   CREATININE 0.62 12/27/2015   BUN 7 12/27/2015   CO2 25 12/27/2015   TSH 3.44 11/30/2015   HGBA1C 5.6 08/25/2012    Dg Bone Density  12/26/2015  EXAM: DUAL X-RAY ABSORPTIOMETRY (DXA) FOR BONE MINERAL DENSITY IMPRESSION: Dear Dr. Derrel Nip, Your patient Crystal Kidd completed a BMD test on 12/26/2015 using the [place] (analysis version: 14.10) manufactured by [employer]. The following summarizes the results of our evaluation. PATIENT BIOGRAPHICAL: Name: Crystal Kidd Patient ID:  [id number] Birth Date: 04-05-1946 Height: 62.0 in. Gender: Female Exam Date: 12/26/2015 Weight: 142.0 lbs. Indications: Advanced Age, Caucasian, Early Menopause, Family History of Fracture, History of Fracture (Adult), Hysterectomy, neuropathy, Oophorectomy Bilateral, Postmenopausal Fractures: Right ankle Treatments: CALCIUM VIT D, gabapentin, Multi-Vitamin with calcium, omeprazole, simvastatin, zyrtec ASSESSMENT: The BMD measured at Femur Neck Left is 0.710 g/cm2 with a T-score of -2.4. This patient is considered osteopenic according to [facility]) criteria. Site Region Measured Measured WHO Young Adult BMD Date       Age      Classification T-score AP Spine L1-L4 12/26/2015 70.5 Osteopenia -1.3 1.035 g/cm2 DualFemur Neck Left 12/26/2015 70.5 Osteopenia -2.4 0.710 g/cm2 World Health Organization [facility]) criteria for post-menopausal, Caucasian Women: Normal:       T-score at or above -1 SD Osteopenia:   T-score  between -1 and -2.5 SD Osteoporosis: T-score at or below -2.5 SD RECOMMENDATIONS: [place] recommends that FDA-approved medical therapies be considered in postmenopausal women and men age 18 or older with a: 1. Hip or vertebral (clinical or morphometric) fracture. 2. T-score of < -2.5 at the spine or hip. 3. Ten-year fracture probability by FRAX of 3% or greater for hip fracture or 20% or greater for major osteoporotic fracture. All treatment decisions require clinical judgment and consideration of individual patient factors, including patient preferences, co-morbidities, previous drug use, risk factors not captured in the FRAX model (e.g. falls, vitamin D deficiency, increased bone turnover, interval significant decline in bone density) and possible under - or over-estimation of fracture risk by FRAX. All patients should ensure an adequate intake of dietary calcium (1200 mg/d) and vitamin D (800 IU daily) unless contraindicated. FOLLOW-UP: People with diagnosed cases  of osteoporosis or at high risk for fracture should have regular bone mineral density tests. For patients eligible for Medicare, routine testing is allowed once every 2 years. The testing frequency can be increased to one year for patients who have rapidly progressing disease, those who are receiving or discontinuing medical therapy to restore bone mass, or have additional risk factors. I have reviewed this report, and agree with the above findings. [facility] Radiology Dear Dr. Derrel Nip, Your patient Crystal Kidd completed a FRAX assessment on 12/26/2015 using the [place] (analysis version: 14.10) manufactured by [employer]. The following summarizes the results of our evaluation. PATIENT BIOGRAPHICAL: Name: Kyrielle, Drennen Patient ID: [id number] Birth Date: [birth year] Height:    62.0 in. Gender:     Female    [age]       Weight:    142.0 lbs. Ethnicity:  White                            Exam Date: 12/26/2015 FRAX* RESULTS:  (version: 3.5) 10-year Probability of Fracture1 Major Osteoporotic Fracture2 Hip Fracture 22.1% 5.3% Population: [place] (Caucasian) Risk Factors: History of Fracture (Adult) Based on Femur (Left) Neck BMD 1 -The 10-year probability of fracture may be lower than reported if the patient has received treatment. 2 -Major Osteoporotic Fracture: Clinical Spine, Forearm, Hip or Shoulder *FRAX is a Materials engineer of the [employer] for Metabolic Bone Disease, a [place] (WHO) [facility]. ASSESSMENT: The probability of a major osteoporotic fracture is 22.1% within the next ten years. The probability of a hip fracture is 5.3% within the next ten years. . Electronically Signed   By: David  Martinique M.D.   On: 12/26/2015 11:44   Dg Abd Acute W/chest  12/27/2015  CLINICAL DATA:  Epigastric pain with bloating and nausea tonight. EXAM: DG ABDOMEN ACUTE W/ 1V CHEST COMPARISON:  Chest 11/25/2003 FINDINGS: Normal heart size and  pulmonary vascularity. Slight interstitial pattern to the lungs is probably due to fibrosis. No focal airspace disease or consolidation in the lungs. No blunting of costophrenic angles. No pneumothorax. Calcification of the aorta. Degenerative changes in the spine and shoulders. Scattered gas and stool in the colon. No small or large bowel distention. No free intra-abdominal air. No abnormal air-fluid levels. No radiopaque stones. Calcified granulomas in the spleen. Visualized bones appear intact. IMPRESSION: No evidence of active pulmonary disease. Normal nonobstructive bowel gas pattern. Electronically Signed   By: Lucienne Capers M.D.   On: 12/27/2015 04:09  Assessment & Plan:   Problem List Items Addressed This Visit    Ileus [facility])    Reviewed ER imaging studies and labs with patient.  Post procedural ileus now appears resolved. Abdominal exam reassuring.    Transition from liquid diet using BRAT and prn laxatives.       UTI (urinary tract infection)    Advised to finish antibiotics and start probiotic. Samples of Align given.  Patient was unable to provide a urine sample to confirm resolution of asymptomatic UTI diagnosed in ED.       Thoracic aortic atherosclerosis ([place])    Noted on plain films done in ED. Patient is tolerating statin therapy and LDL is < 100.  Lab Results  Component Value Date   CHOL 141 11/30/2015   HDL 44.20 11/30/2015   LDLCALC 72 11/30/2015   LDLDIRECT 79.0 11/30/2015   TRIG 125.0 11/30/2015   CHOLHDL 3 11/30/2015          Other Visit Diagnoses    Dysuria    -  Primary    Relevant Orders    POCT Urinalysis Dipstick    Urine Culture    Urinalysis, Routine w reflex microscopic      . I am having Ms. Mehaffey maintain her multivitamin, calcium carbonate, FIBER SELECT GUMMIES PO, fluticasone, loratadine, Vitamin D3, baclofen, levothyroxine, promethazine, gabapentin, hydrochlorothiazide, omeprazole, losartan, simvastatin, and cephALEXin.  No orders of  the defined types were placed in this encounter.    There are no discontinued medications.  Follow-up: Return in about 4 weeks (around 01/29/2016) for discuss results of DEXA .   [name], MD

## 2016-01-01 NOTE — Patient Instructions (Addendum)
Ok to use dulcolax ,  One more dose  Tonight  Ok to continue Gas X   As directed on bax to help dissolve the gas bubbles  Keep your diet easy to digest"  B.R>A>T>  BANANAS rice,  Apple sauce and toast.   Avoid salads, and dairy for the next  4-5 days  Stay hydrated    Please take a probiotic ( Align, [place] or [place]lle), or he generic version of one of these over the counter medications,  for a minimum of 3 weeks to prevent a serious antibiotic associated diarrhea  Called clostridium dificile colitis.  Taking a probiotic may also prevent vaginitis due to yeast infections and can be continued indefinitely if you feel that it improves your digestion or your elimination (bowels).    Food Choices to Help Relieve Diarrhea, Adult When you have diarrhea, the foods you eat and your eating habits are very important. Choosing the right foods and drinks can help relieve diarrhea. Also, because diarrhea can last up to 7 days, you need to replace lost fluids and electrolytes (such as sodium, potassium, and chloride) in order to help prevent dehydration.  WHAT GENERAL GUIDELINES DO I NEED TO FOLLOW?  Slowly drink 1 cup (8 oz) of fluid for each episode of diarrhea. If you are getting enough fluid, your urine will be clear or pale yellow.  Eat starchy foods. Some good choices include white rice, white toast, pasta, low-fiber cereal, baked potatoes (without the skin), saltine crackers, and bagels.  Avoid large servings of any cooked vegetables.  Limit fruit to two servings per day. A serving is  cup or 1 small piece.  Choose foods with less than 2 g of fiber per serving.  Limit fats to less than 8 tsp (38 g) per day.  Avoid fried foods.  Eat foods that have probiotics in them. Probiotics can be found in certain dairy products.  Avoid foods and beverages that may increase the speed at which food moves through the stomach and intestines (gastrointestinal tract). Things to avoid  include:  High-fiber foods, such as dried fruit, raw fruits and vegetables, nuts, seeds, and whole grain foods.  Spicy foods and high-fat foods.  Foods and beverages sweetened with high-fructose corn syrup, honey, or sugar alcohols such as xylitol, sorbitol, and mannitol. WHAT FOODS ARE RECOMMENDED? Grains White rice. White, [place], or pita breads (fresh or toasted), including plain rolls, buns, or bagels. White pasta. Saltine, soda, or graham crackers. Pretzels. Low-fiber cereal. Cooked cereals made with water (such as cornmeal, farina, or cream cereals). Plain muffins. Matzo. Melba toast. Zwieback.  Vegetables Potatoes (without the skin). Strained tomato and vegetable juices. Most well-cooked and canned vegetables without seeds. Tender lettuce. Fruits Cooked or canned applesauce, apricots, cherries, fruit cocktail, grapefruit, peaches, pears, or plums. Fresh bananas, apples without skin, cherries, grapes, cantaloupe, grapefruit, peaches, oranges, or plums.  Meat and Other Protein Products Baked or boiled chicken. Eggs. Tofu. Fish. Seafood. Smooth peanut butter. Ground or well-cooked tender beef, ham, veal, lamb, pork, or poultry.  Dairy Plain yogurt, kefir, and unsweetened liquid yogurt. Lactose-free milk, buttermilk, or soy milk. Plain hard cheese. Beverages Sport drinks. Clear broths. Diluted fruit juices (except prune). Regular, caffeine-free sodas such as ginger ale. Water. Decaffeinated teas. Oral rehydration solutions. Sugar-free beverages not sweetened with sugar alcohols. Other Bouillon, broth, or soups made from recommended foods.  The items listed above may not be a complete list of recommended foods or beverages. Contact your dietitian for more options. WHAT FOODS  ARE NOT RECOMMENDED? Grains Whole grain, whole wheat, bran, or rye breads, rolls, pastas, crackers, and cereals. Wild or brown rice. Cereals that contain more than 2 g of fiber per serving. Corn tortillas or taco  shells. Cooked or dry oatmeal. Granola. Popcorn. Vegetables Raw vegetables. Cabbage, broccoli, Brussels sprouts, artichokes, baked beans, beet greens, corn, kale, legumes, peas, sweet potatoes, and yams. Potato skins. Cooked spinach and cabbage. Fruits Dried fruit, including raisins and dates. Raw fruits. Stewed or dried prunes. Fresh apples with skin, apricots, mangoes, pears, raspberries, and strawberries.  Meat and Other Protein Products Chunky peanut butter. Nuts and seeds. Beans and lentils. Berniece Salines.  Dairy High-fat cheeses. Milk, chocolate milk, and beverages made with milk, such as milk shakes. Cream. Ice cream. Sweets and Desserts Sweet rolls, doughnuts, and sweet breads. Pancakes and waffles. Fats and Oils Butter. Cream sauces. Margarine. Salad oils. Plain salad dressings. Olives. Avocados.  Beverages Caffeinated beverages (such as coffee, tea, soda, or energy drinks). Alcoholic beverages. Fruit juices with pulp. Prune juice. Soft drinks sweetened with high-fructose corn syrup or sugar alcohols. Other Coconut. Hot sauce. Chili powder. Mayonnaise. Gravy. Cream-based or milk-based soups.  The items listed above may not be a complete list of foods and beverages to avoid. Contact your dietitian for more information. WHAT SHOULD I DO IF I BECOME DEHYDRATED? Diarrhea can sometimes lead to dehydration. Signs of dehydration include dark urine and dry mouth and skin. If you think you are dehydrated, you should rehydrate with an oral rehydration solution. These solutions can be purchased at pharmacies, retail stores, or online.  Drink -1 cup (120-240 mL) of oral rehydration solution each time you have an episode of diarrhea. If drinking this amount makes your diarrhea worse, try drinking smaller amounts more often. For example, drink 1-3 tsp (5-15 mL) every 5-10 minutes.  A general rule for staying hydrated is to drink 1-2 L of fluid per day. Talk to your health care provider about the specific  amount you should be drinking each day. Drink enough fluids to keep your urine clear or pale yellow.   This information is not intended to replace advice given to you by your health care provider. Make sure you discuss any questions you have with your health care provider.   Document Released: 10/19/2003 Document Revised: 08/19/2014 Document Reviewed: 06/21/2013 Elsevier Interactive Patient Education [employer].

## 2016-01-02 ENCOUNTER — Encounter: Payer: Self-pay | Admitting: Internal Medicine

## 2016-01-02 ENCOUNTER — Other Ambulatory Visit: Payer: Commercial Managed Care - HMO

## 2016-01-02 DIAGNOSIS — I700 Atherosclerosis of aorta: Secondary | ICD-10-CM | POA: Insufficient documentation

## 2016-01-02 DIAGNOSIS — N390 Urinary tract infection, site not specified: Secondary | ICD-10-CM | POA: Insufficient documentation

## 2016-01-02 DIAGNOSIS — R300 Dysuria: Secondary | ICD-10-CM | POA: Diagnosis not present

## 2016-01-02 DIAGNOSIS — K567 Ileus, unspecified: Secondary | ICD-10-CM | POA: Insufficient documentation

## 2016-01-02 LAB — URINALYSIS, ROUTINE W REFLEX MICROSCOPIC
BILIRUBIN URINE: NEGATIVE
Hgb urine dipstick: NEGATIVE
Ketones, ur: NEGATIVE
LEUKOCYTES UA: NEGATIVE
NITRITE: NEGATIVE
PH: 5.5 (ref 5.0–8.0)
RBC / HPF: NONE SEEN (ref 0–?)
Specific Gravity, Urine: 1.01 (ref 1.000–1.030)
TOTAL PROTEIN, URINE-UPE24: NEGATIVE
URINE GLUCOSE: NEGATIVE
UROBILINOGEN UA: 0.2 (ref 0.0–1.0)
WBC UA: NONE SEEN (ref 0–?)

## 2016-01-02 NOTE — Assessment & Plan Note (Signed)
Noted on plain films done in ED. Patient is tolerating statin therapy and LDL is < 100.  Lab Results  Component Value Date   CHOL 141 11/30/2015   HDL 44.20 11/30/2015   LDLCALC 72 11/30/2015   LDLDIRECT 79.0 11/30/2015   TRIG 125.0 11/30/2015   CHOLHDL 3 11/30/2015

## 2016-01-02 NOTE — Assessment & Plan Note (Signed)
Advised to finish antibiotics and start probiotic. Samples of Align given.  Patient was unable to provide a urine sample to confirm resolution of asymptomatic UTI diagnosed in ED.

## 2016-01-02 NOTE — Assessment & Plan Note (Signed)
Reviewed ER imaging studies and labs with patient.  Post procedural ileus now appears resolved. Abdominal exam reassuring.    Transition from liquid diet using BRAT and prn laxatives.

## 2016-01-05 ENCOUNTER — Telehealth: Payer: Self-pay | Admitting: Internal Medicine

## 2016-01-05 LAB — URINE CULTURE: Colony Count: >=100000

## 2016-01-05 MED ORDER — CEFDINIR 300 MG PO CAPS: 300.0000 mg | ORAL_CAPSULE | Freq: Two times a day (BID) | ORAL | Status: DC

## 2016-01-05 NOTE — Telephone Encounter (Signed)
SPoke with the patient she has picked up the new prescription. Thanks

## 2016-01-05 NOTE — Telephone Encounter (Signed)
HER UTI is resistant to cephalexin and sensitive to cipro,  but she has a documented  prior adverse reaction to flouroquinolones . So i have called in cefdinir,  Twice daily for 5 days   Probiotic advised.

## 2016-01-05 NOTE — Telephone Encounter (Signed)
Left a VM to call me back.

## 2016-01-05 NOTE — Telephone Encounter (Signed)
Pt called to follow up on her urine sample that was dropped off on 01/02/16.   Call pt @ [phone]. Thank you!

## 2016-01-05 NOTE — Addendum Note (Signed)
Addended by: [name] on: 01/05/2016 02:43 PM   Modules accepted: Orders, SmartSet

## 2016-01-12 ENCOUNTER — Other Ambulatory Visit: Payer: Self-pay | Admitting: Internal Medicine

## 2016-01-15 ENCOUNTER — Telehealth: Payer: Self-pay | Admitting: Internal Medicine

## 2016-01-15 NOTE — Telephone Encounter (Signed)
She spoke with Lavella Lemons about  The results and the change in antibiotics  on May 26,  See chart

## 2016-01-15 NOTE — Telephone Encounter (Signed)
Pt called returning your call.   Call pt @ [phone]. Thank you!

## 2016-01-15 NOTE — Telephone Encounter (Signed)
Spoke with her, explained that that was the same one we discussed last week, she was okay with that. thanks

## 2016-01-15 NOTE — Telephone Encounter (Signed)
Sorry I do see that, I tried to call her back to clarify as we had a discussion about this already.

## 2016-01-15 NOTE — Telephone Encounter (Signed)
Pt called wanting to get her urine results from 05/23.   Call pt @ [phone]. Thank you!

## 2016-01-15 NOTE — Telephone Encounter (Signed)
Results in EPIC please advise, thanks

## 2016-03-20 ENCOUNTER — Other Ambulatory Visit: Payer: Self-pay | Admitting: Internal Medicine

## 2016-04-03 ENCOUNTER — Other Ambulatory Visit: Payer: Self-pay | Admitting: Internal Medicine

## 2016-04-04 DIAGNOSIS — R05 Cough: Secondary | ICD-10-CM | POA: Diagnosis not present

## 2016-04-04 DIAGNOSIS — J0140 Acute pansinusitis, unspecified: Secondary | ICD-10-CM | POA: Diagnosis not present

## 2016-04-05 ENCOUNTER — Telehealth: Payer: Self-pay | Admitting: Internal Medicine

## 2016-04-05 NOTE — Telephone Encounter (Signed)
Spoke to patient and informed her that [place] nor [place] station have any openings for appointments. Instructed patient to go to Urgent care.  Patient then stated she is going to go back to [facility].

## 2016-04-05 NOTE — Telephone Encounter (Signed)
Ms. Crystal Kidd called saying she's experiencing the following symptoms: ear redness, head stopped up, possible UTI, fever, consistent cough. [facility] went to [facility] to the [facility] and they informed her that she needs to be seen by a provider. Since we have no openings, she's wondering if she should go back to the minute clinic, go to [facility], or go to an Urgent Care. Please give her a phone call regarding this.  Pt's ph# [phone] Thank you.

## 2016-04-09 ENCOUNTER — Encounter: Payer: Self-pay | Admitting: Family

## 2016-04-09 ENCOUNTER — Ambulatory Visit (INDEPENDENT_AMBULATORY_CARE_PROVIDER_SITE_OTHER): Payer: Commercial Managed Care - HMO | Admitting: Family

## 2016-04-09 VITALS — BP 152/80 | HR 84 | Temp 98.2°F | Ht 62.5 in | Wt 141.2 lb

## 2016-04-09 DIAGNOSIS — N390 Urinary tract infection, site not specified: Secondary | ICD-10-CM | POA: Diagnosis not present

## 2016-04-09 DIAGNOSIS — R0981 Nasal congestion: Secondary | ICD-10-CM

## 2016-04-09 DIAGNOSIS — Z23 Encounter for immunization: Secondary | ICD-10-CM

## 2016-04-09 LAB — POCT URINALYSIS DIPSTICK
Bilirubin, UA: NEGATIVE
Blood, UA: NEGATIVE
Glucose, UA: NEGATIVE
KETONES UA: NEGATIVE
LEUKOCYTES UA: NEGATIVE
Nitrite, UA: NEGATIVE
PH UA: 7
PROTEIN UA: NEGATIVE
SPEC GRAV UA: 1.01
UROBILINOGEN UA: 0.2

## 2016-04-09 NOTE — Progress Notes (Signed)
Subjective:    Patient ID: Crystal Kidd, female    DOB: May 06, 1946, 70 y.o.   MRN: [id number]  CC: Crystal Kidd is a 70 y.o. female who presents today for an acute visit.    HPI: Patient here for acute visit with chief complaint of follow-up after being seen at urgent care 5 days ago for sinus congestion and urinary tract infection. At that time had cough, ear pain, fever of 101, chills, and 'dizzy like.' No dysuria. On cefdinir and tessalon. Fever resolved. Still coughing and has congestion however improved. No wheezing, SOB.     HISTORY:  Past Medical History:  Diagnosis Date  . Allergic rhinoconjunctivitis   . Allergy 1992  . Arthritis   . Cataract   . Diverticulosis   . GERD (gastroesophageal reflux disease)    PMH of esophageal stricture  . Hyperlipidemia   . Hypertension   . Osteopenia    BMD @[facility]   . Thyroid disease    hypothyroidism  . Transfusion history 1968   post partum  . Trapezius muscle spasm 04/13/2015  . Vitiligo    Past Surgical History:  Procedure Laterality Date  . ABDOMINAL HYSTERECTOMY     for dysfunctional menses; USO with TAH  . APPENDECTOMY    . BREAST SURGERY     biopsy X 2  . COLONOSCOPY     X2; Tics; Dr Fuller Plan, [facility]  . [place]    . FRACTURE SURGERY  2003or 2004  . ROTATOR CUFF REPAIR     R shoulder  . TUBAL LIGATION     befor hysterectomy  . UPPER GASTROINTESTINAL ENDOSCOPY     Family History  Problem Relation Age of Onset  . Breast cancer Sister     mammograms @ [facility]  . Colon polyps Sister   . Colon cancer Maternal Aunt   . Cancer Maternal Aunt 80    colon cA  . COPD Father   . Coronary artery disease Brother   . COPD Brother   . Colon polyps Brother   . Diabetes Neg Hx   . Stroke Neg Hx   . Heart disease Neg Hx   . Esophageal cancer Neg Hx   . Rectal cancer Neg Hx   . Cancer Sister 58    BREAST  . Cancer Mother     breast&stomach  . Stomach cancer Mother   . Breast cancer Mother     . COPD Son     Allergies: Besivance [besifloxacin hcl]; Alendronate sodium; Dilaudid [hydromorphone hcl]; and Ibandronate sodium Current Outpatient Prescriptions on File Prior to Visit  Medication Sig Dispense Refill  . baclofen (LIORESAL) 10 MG tablet Take 0.5 tablets (5 mg total) by mouth 3 (three) times daily as needed for muscle spasms. 30 each 0  . calcium carbonate (TUMS) 500 MG chewable tablet Chew 1 tablet by mouth as needed.     . cefdinir (OMNICEF) 300 MG capsule Take 1 capsule (300 mg total) by mouth 2 (two) times daily. 10 capsule 0  . cephALEXin (KEFLEX) 500 MG capsule Take 1 capsule (500 mg total) by mouth 2 (two) times daily. 20 capsule 0  . Cholecalciferol (VITAMIN D3) 2000 UNITS TABS Take 2,000 Units by mouth daily. Reported on 01/01/2016    . FIBER SELECT GUMMIES PO Take by mouth.      . fluticasone (FLONASE) 50 MCG/ACT nasal spray Place 2 sprays into both nostrils daily. 16 g 6  .  gabapentin (NEURONTIN) 100 MG capsule TAKE 1 CAPSULE (100 MG TOTAL) BY MOUTH 3 (THREE) TIMES DAILY. EVERY 8 HOURS AS NEEDED 90 capsule 3  . hydrochlorothiazide (HYDRODIURIL) 25 MG tablet TAKE 1/2 TABLET DAILY AS NEEDED 45 tablet 1  . levothyroxine (SYNTHROID, LEVOTHROID) 50 MCG tablet TAKE 1 TABLET DAILY EXCEPT TAKE 1 AND 1/2 TABLETS (75MCG) MONDAY, WEDNESDAY AND FRIDAY AS DIRECTED 108 tablet 2  . loratadine (CLARITIN) 10 MG tablet Take 10 mg by mouth daily.    Marland Kitchen losartan (COZAAR) 50 MG tablet TAKE 1 AND 1/2 TABLETS EVERY DAY 135 tablet 1  . Multiple Vitamin (MULTIVITAMIN) tablet Take 2 tablets by mouth daily.     Marland Kitchen omeprazole (PRILOSEC) 40 MG capsule TAKE 1 CAPSULE EVERY DAY 90 capsule 3  . promethazine (PHENERGAN) 12.5 MG tablet Take 1 tablet (12.5 mg total) by mouth every 8 (eight) hours as needed for nausea or vomiting. 20 tablet 0  . simvastatin (ZOCOR) 20 MG tablet TAKE 1 TABLET EVERY EVENING 90 tablet 1   No current facility-administered medications on file prior to visit.     Social  History  Substance Use Topics  . Smoking status: Never Smoker  . Smokeless tobacco: Never Used  . Alcohol use Yes    1 - 6 Glasses of wine per week     Comment: have wine 1 glass every 6 month    Review of Systems  Constitutional: Negative for chills and fever.  HENT: Positive for congestion and rhinorrhea. Negative for ear pain and sinus pressure.   Respiratory: Positive for cough. Negative for shortness of breath and wheezing.   Cardiovascular: Negative for chest pain and palpitations.  Gastrointestinal: Negative for nausea and vomiting.  Genitourinary: Negative for dysuria, flank pain and frequency.      Objective:    BP (!) 152/80   Pulse 84   Temp 98.2 F (36.8 C) (Oral)   Ht 5' 2.5" (1.588 m)   Wt 141 lb 3.2 oz (64 kg)   SpO2 98%   BMI 25.41 kg/m    Physical Exam  Constitutional: She appears well-developed and well-nourished.  HENT:  Head: Normocephalic and atraumatic.  Right Ear: Hearing, tympanic membrane, external ear and ear canal normal. No drainage, swelling or tenderness. No foreign bodies. Tympanic membrane is not erythematous and not bulging. No middle ear effusion. No decreased hearing is noted.  Left Ear: Hearing, tympanic membrane, external ear and ear canal normal. No drainage, swelling or tenderness. No foreign bodies. Tympanic membrane is not erythematous and not bulging.  No middle ear effusion. No decreased hearing is noted.  Nose: Nose normal. No rhinorrhea. Right sinus exhibits no maxillary sinus tenderness and no frontal sinus tenderness. Left sinus exhibits no maxillary sinus tenderness and no frontal sinus tenderness.  Mouth/Throat: Uvula is midline, oropharynx is clear and moist and mucous membranes are normal. No oropharyngeal exudate, posterior oropharyngeal edema, posterior oropharyngeal erythema or tonsillar abscesses.  Eyes: Conjunctivae are normal.  Cardiovascular: Normal rate, regular rhythm, normal heart sounds and normal pulses.     Pulmonary/Chest: Effort normal and breath sounds normal. She has no wheezes. She has no rhonchi. She has no rales.  Abdominal: There is no CVA tenderness.  Lymphadenopathy:       Head (right side): No submental, no submandibular, no tonsillar, no preauricular, no posterior auricular and no occipital adenopathy present.       Head (left side): No submental, no submandibular, no tonsillar, no preauricular, no posterior auricular and no occipital adenopathy present.  She has no cervical adenopathy.  Neurological: She is alert.  Skin: Skin is warm and dry.  Psychiatric: She has a normal mood and affect. Her speech is normal and behavior is normal. Thought content normal.  Vitals reviewed.      Assessment & Plan:   1. Urinary tract infection without hematuria, site unspecified Patient is asymptomatic. Urinalysis is negative for leukocytes, blood, nitrites. I reassured patient it appears her UTI is resolving on the Ceftinir.  - POCT Urinalysis Dipstick  2. Sinus congestion Patient symptoms are improving. She is afebrile today. No adventitious lung sounds. I reassured patient that her sinusitis appears to be improving as well on Ceftinir.    I am having Ms. Murray maintain her multivitamin, calcium carbonate, FIBER SELECT GUMMIES PO, fluticasone, loratadine, Vitamin D3, baclofen, levothyroxine, promethazine, gabapentin, omeprazole, cephALEXin, cefdinir, hydrochlorothiazide, simvastatin, losartan, Phenylephrine-Acetaminophen, Acetaminophen, benzonatate, and ZYRTEC ALLERGY.   Meds ordered this encounter  Medications  . Phenylephrine-Acetaminophen 5-325 MG TABS  . Acetaminophen 500 MG coapsule  . benzonatate (TESSALON) 100 MG capsule  . ZYRTEC ALLERGY 10 MG CAPS    Return precautions given.   Risks, benefits, and alternatives of the medications and treatment plan prescribed today were discussed, and patient expressed understanding.   Education regarding symptom management and  diagnosis given to patient on AVS.  Continue to follow with TULLO, Aris Everts, MD for routine health maintenance.   Crystal Kidd and I agreed with plan.   Mable Paris, FNP

## 2016-04-09 NOTE — Progress Notes (Signed)
Pre visit review using our clinic review tool, if applicable. No additional management support is needed unless otherwise documented below in the visit note. 

## 2016-04-09 NOTE — Patient Instructions (Signed)
May try mucinex ( plain) to loosen congestion. Drinks lots of water.  Honey for cough.

## 2016-04-16 ENCOUNTER — Other Ambulatory Visit: Payer: Self-pay | Admitting: Internal Medicine

## 2016-04-17 ENCOUNTER — Telehealth: Payer: Self-pay | Admitting: Internal Medicine

## 2016-04-17 DIAGNOSIS — D492 Neoplasm of unspecified behavior of bone, soft tissue, and skin: Secondary | ICD-10-CM

## 2016-04-17 NOTE — Telephone Encounter (Signed)
Pt called stating she needs a referral to the dermatologist [facility] [phone]. Pt appt is on 06/06/16 @ 10:20am. Thank you!  Call pt @ [phone]. Thank you!

## 2016-04-17 NOTE — Telephone Encounter (Signed)
Referral made 

## 2016-04-17 NOTE — Telephone Encounter (Signed)
Please advise 

## 2016-04-29 ENCOUNTER — Encounter (HOSPITAL_COMMUNITY): Payer: Self-pay | Admitting: Emergency Medicine

## 2016-04-29 ENCOUNTER — Emergency Department (HOSPITAL_COMMUNITY): Payer: Commercial Managed Care - HMO

## 2016-04-29 ENCOUNTER — Observation Stay (HOSPITAL_COMMUNITY)
Admission: EM | Admit: 2016-04-29 | Discharge: 2016-04-30 | Disposition: A | Discharge status: Home / Self Care | Payer: Commercial Managed Care - HMO | Attending: General Surgery | Admitting: General Surgery

## 2016-04-29 DIAGNOSIS — K219 Gastro-esophageal reflux disease without esophagitis: Secondary | ICD-10-CM | POA: Insufficient documentation

## 2016-04-29 DIAGNOSIS — R7981 Abnormal blood-gas level: Secondary | ICD-10-CM

## 2016-04-29 DIAGNOSIS — R0789 Other chest pain: Secondary | ICD-10-CM | POA: Diagnosis present

## 2016-04-29 DIAGNOSIS — I10 Essential (primary) hypertension: Secondary | ICD-10-CM | POA: Diagnosis not present

## 2016-04-29 DIAGNOSIS — R1011 Right upper quadrant pain: Secondary | ICD-10-CM | POA: Diagnosis not present

## 2016-04-29 DIAGNOSIS — E039 Hypothyroidism, unspecified: Secondary | ICD-10-CM | POA: Insufficient documentation

## 2016-04-29 DIAGNOSIS — R072 Precordial pain: Secondary | ICD-10-CM | POA: Diagnosis not present

## 2016-04-29 DIAGNOSIS — K810 Acute cholecystitis: Secondary | ICD-10-CM | POA: Diagnosis not present

## 2016-04-29 DIAGNOSIS — Z419 Encounter for procedure for purposes other than remedying health state, unspecified: Secondary | ICD-10-CM

## 2016-04-29 DIAGNOSIS — R112 Nausea with vomiting, unspecified: Secondary | ICD-10-CM | POA: Diagnosis not present

## 2016-04-29 DIAGNOSIS — E785 Hyperlipidemia, unspecified: Secondary | ICD-10-CM | POA: Diagnosis not present

## 2016-04-29 DIAGNOSIS — R531 Weakness: Secondary | ICD-10-CM | POA: Diagnosis not present

## 2016-04-29 DIAGNOSIS — Z792 Long term (current) use of antibiotics: Secondary | ICD-10-CM | POA: Diagnosis not present

## 2016-04-29 DIAGNOSIS — Z79899 Other long term (current) drug therapy: Secondary | ICD-10-CM | POA: Insufficient documentation

## 2016-04-29 DIAGNOSIS — R1013 Epigastric pain: Secondary | ICD-10-CM | POA: Diagnosis present

## 2016-04-29 DIAGNOSIS — K2970 Gastritis, unspecified, without bleeding: Secondary | ICD-10-CM | POA: Diagnosis not present

## 2016-04-29 DIAGNOSIS — K819 Cholecystitis, unspecified: Secondary | ICD-10-CM | POA: Diagnosis not present

## 2016-04-29 DIAGNOSIS — R079 Chest pain, unspecified: Secondary | ICD-10-CM | POA: Diagnosis not present

## 2016-04-29 LAB — COMPREHENSIVE METABOLIC PANEL
ALK PHOS: 89 U/L (ref 38–126)
ALT: 14 U/L (ref 14–54)
ANION GAP: 8 (ref 5–15)
AST: 20 U/L (ref 15–41)
Albumin: 3.8 g/dL (ref 3.5–5.0)
BUN: 8 mg/dL (ref 6–20)
CALCIUM: 9.2 mg/dL (ref 8.9–10.3)
CO2: 26 mmol/L (ref 22–32)
CREATININE: 0.71 mg/dL (ref 0.44–1.00)
Chloride: 102 mmol/L (ref 101–111)
Glucose, Bld: 120 mg/dL — ABNORMAL HIGH (ref 65–99)
Potassium: 3.3 mmol/L — ABNORMAL LOW (ref 3.5–5.1)
SODIUM: 136 mmol/L (ref 135–145)
Total Bilirubin: 0.7 mg/dL (ref 0.3–1.2)
Total Protein: 6.9 g/dL (ref 6.5–8.1)

## 2016-04-29 LAB — URINE MICROSCOPIC-ADD ON: RBC / HPF: NONE SEEN RBC/hpf (ref 0–5)

## 2016-04-29 LAB — CBC
HCT: 43.2 % (ref 36.0–46.0)
HEMOGLOBIN: 14.3 g/dL (ref 12.0–15.0)
MCH: 29.4 pg (ref 26.0–34.0)
MCHC: 33.1 g/dL (ref 30.0–36.0)
MCV: 88.7 fL (ref 78.0–100.0)
PLATELETS: 257 10*3/uL (ref 150–400)
RBC: 4.87 MIL/uL (ref 3.87–5.11)
RDW: 12.9 % (ref 11.5–15.5)
WBC: 12.8 10*3/uL — AB (ref 4.0–10.5)

## 2016-04-29 LAB — URINALYSIS, ROUTINE W REFLEX MICROSCOPIC
Bilirubin Urine: NEGATIVE
GLUCOSE, UA: NEGATIVE mg/dL
Hgb urine dipstick: NEGATIVE
KETONES UR: 15 mg/dL — AB
NITRITE: POSITIVE — AB
PH: 6.5 (ref 5.0–8.0)
Protein, ur: NEGATIVE mg/dL
SPECIFIC GRAVITY, URINE: 1.009 (ref 1.005–1.030)

## 2016-04-29 LAB — I-STAT TROPONIN, ED
Troponin i, poc: 0.01 ng/mL (ref 0.00–0.08)
Troponin i, poc: 0.01 ng/mL (ref 0.00–0.08)

## 2016-04-29 LAB — LIPASE, BLOOD: Lipase: 24 U/L (ref 11–51)

## 2016-04-29 MED ORDER — MORPHINE SULFATE (PF) 2 MG/ML IV SOLN
1.0000 mg | INTRAVENOUS | Status: DC | PRN
  Administered 2016-04-29: 2 mg via INTRAVENOUS
  Administered 2016-04-29 (×2): 4 mg via INTRAVENOUS
  Administered 2016-04-30: 2 mg via INTRAVENOUS
  Administered 2016-04-30 (×2): 4 mg via INTRAVENOUS
  Filled 2016-04-29: qty 2
  Filled 2016-04-29: qty 1
  Filled 2016-04-29: qty 2
  Filled 2016-04-29: qty 1
  Filled 2016-04-29 (×2): qty 2

## 2016-04-29 MED ORDER — FENTANYL CITRATE (PF) 100 MCG/2ML IJ SOLN
50.0000 ug | Freq: Once | INTRAMUSCULAR | Status: AC
  Administered 2016-04-29: 50 ug via INTRAVENOUS
  Filled 2016-04-29: qty 2

## 2016-04-29 MED ORDER — MORPHINE SULFATE (PF) 2 MG/ML IV SOLN
2.0000 mg | Freq: Once | INTRAVENOUS | Status: AC
  Administered 2016-04-29: 2 mg via INTRAVENOUS
  Filled 2016-04-29: qty 1

## 2016-04-29 MED ORDER — SODIUM CHLORIDE 0.9 % IV SOLN
Freq: Once | INTRAVENOUS | Status: AC
  Administered 2016-04-29: 17:00:00 via INTRAVENOUS

## 2016-04-29 MED ORDER — SODIUM CHLORIDE 0.9 % IV BOLUS (SEPSIS)
500.0000 mL | Freq: Once | INTRAVENOUS | Status: AC
  Administered 2016-04-29: 500 mL via INTRAVENOUS

## 2016-04-29 MED ORDER — OXYCODONE HCL 5 MG PO TABS
5.0000 mg | ORAL_TABLET | ORAL | Status: DC | PRN
  Administered 2016-04-30 – 2016-05-02 (×6): 10 mg via ORAL
  Filled 2016-04-29 (×6): qty 2

## 2016-04-29 MED ORDER — POTASSIUM CHLORIDE CRYS ER 20 MEQ PO TBCR
40.0000 meq | EXTENDED_RELEASE_TABLET | Freq: Once | ORAL | Status: DC
  Filled 2016-04-29: qty 2

## 2016-04-29 MED ORDER — ONDANSETRON HCL 4 MG/2ML IJ SOLN
4.0000 mg | Freq: Four times a day (QID) | INTRAMUSCULAR | Status: DC | PRN
  Administered 2016-04-30 – 2016-05-01 (×4): 4 mg via INTRAVENOUS
  Filled 2016-04-29 (×4): qty 2

## 2016-04-29 MED ORDER — ONDANSETRON 4 MG PO TBDP
4.0000 mg | ORAL_TABLET | Freq: Four times a day (QID) | ORAL | Status: DC | PRN
  Administered 2016-05-01: 4 mg via ORAL
  Filled 2016-04-29: qty 1

## 2016-04-29 MED ORDER — ONDANSETRON HCL 4 MG/2ML IJ SOLN
4.0000 mg | Freq: Once | INTRAMUSCULAR | Status: AC
  Administered 2016-04-29: 4 mg via INTRAVENOUS
  Filled 2016-04-29: qty 2

## 2016-04-29 MED ORDER — METHOCARBAMOL 500 MG PO TABS
500.0000 mg | ORAL_TABLET | Freq: Four times a day (QID) | ORAL | Status: DC | PRN
  Administered 2016-04-29 – 2016-05-01 (×4): 500 mg via ORAL
  Filled 2016-04-29 (×5): qty 1

## 2016-04-29 NOTE — ED Triage Notes (Signed)
Pt brought in by EMS due to chest pain and ABD pain. Pt does endorse nausea and vomiting. Per EMS pt was having some left sided facial numbness, but since has been resolved.. Pt is a&ox4.

## 2016-04-29 NOTE — H&P (Signed)
Crystal Kidd is an 70 y.o. female.   Chief Complaint: Epigastric abdominal pain with nausea and vomiting HPI: This is a 70 year old female who presented with epigastric abdominal pain. She had a negative cardiac workup. The pain occurred up into her chest. She also has had nausea and vomiting. This actually started 2 days ago. She now also has pain in her right upper quadrant. She has had an ultrasound showing find consistent with cholecystitis. She may have had a similar attack earlier this year which was worked up his chest pain. The pain is described as sharp, moderate to severe, and in the right upper quadrant and epigastrium  Past Medical History:  Diagnosis Date  . Allergic rhinoconjunctivitis   . Allergy 1992  . Arthritis   . Cataract   . Diverticulosis   . GERD (gastroesophageal reflux disease)    PMH of esophageal stricture  . Hyperlipidemia   . Hypertension   . Osteopenia    BMD _0   . Thyroid disease    hypothyroidism  . Transfusion history 1968   post partum  . Trapezius muscle spasm 04/13/2015  . Vitiligo     Past Surgical History:  Procedure Laterality Date  . ABDOMINAL HYSTERECTOMY     for dysfunctional menses; USO with TAH  . APPENDECTOMY    . BREAST SURGERY     biopsy X 2  . COLONOSCOPY     X2; Tics; Dr Fuller Plan, GI  . [place]    . FRACTURE SURGERY  2003or 2004  . ROTATOR CUFF REPAIR     R shoulder  . TUBAL LIGATION     befor hysterectomy  . UPPER GASTROINTESTINAL ENDOSCOPY      Family History  Problem Relation Age of Onset  . Breast cancer Sister     mammograms @ [facility]  . Colon polyps Sister   . COPD Father   . Cancer Mother     breast&stomach  . Stomach cancer Mother   . Breast cancer Mother   . Colon cancer Maternal Aunt   . Cancer Maternal Aunt 80    colon cA  . Coronary artery disease Brother   . COPD Brother   . Colon polyps Brother   . Cancer Sister 88    BREAST  . COPD Son   . Diabetes Neg Hx    . Stroke Neg Hx   . Heart disease Neg Hx   . Esophageal cancer Neg Hx   . Rectal cancer Neg Hx    Social History:  reports that she has never smoked. She has never used smokeless tobacco. She reports that she drinks alcohol. She reports that she does not use drugs.  Allergies:  Allergies  Allergen Reactions  . Besivance [Besifloxacin Hcl] Shortness Of Breath and Other (See Comments)    Dizziness  . Alendronate Sodium Other (See Comments)    REACTION: intolerant ( PMH of esophageal stricture)  . Dilaudid [Hydromorphone Hcl] Itching    Nasal itching; resolution with Benadryl  . Ibandronate Sodium Other (See Comments)     bone pain     (Not in a hospital admission)  Results for orders placed or performed during the hospital encounter of 04/29/16 (from the past 48 hour(s))  Comprehensive metabolic panel     Status: Abnormal   Collection Time: 04/29/16  2:29 PM  Result Value Ref Range   Sodium 136 135 - 145 mmol/L   Potassium 3.3 (L) 3.5 - 5.1  mmol/L   Chloride 102 101 - 111 mmol/L   CO2 26 22 - 32 mmol/L   Glucose, Bld 120 (H) 65 - 99 mg/dL   BUN 8 6 - 20 mg/dL   Creatinine, Ser 0.71 0.44 - 1.00 mg/dL   Calcium 9.2 8.9 - 10.3 mg/dL   Total Protein 6.9 6.5 - 8.1 g/dL   Albumin 3.8 3.5 - 5.0 g/dL   AST 20 15 - 41 U/L   ALT 14 14 - 54 U/L   Alkaline Phosphatase 89 38 - 126 U/L   Total Bilirubin 0.7 0.3 - 1.2 mg/dL   GFR calc non Af Amer >60 >60 mL/min   GFR calc Af Amer >60 >60 mL/min    Comment: (NOTE) The eGFR has been calculated using the CKD EPI equation. This calculation has not been validated in all clinical situations. eGFR's persistently <60 mL/min signify possible Chronic Kidney Disease.    Anion gap 8 5 - 15  CBC     Status: Abnormal   Collection Time: 04/29/16  2:29 PM  Result Value Ref Range   WBC 12.8 (H) 4.0 - 10.5 K/uL   RBC 4.87 3.87 - 5.11 MIL/uL   Hemoglobin 14.3 12.0 - 15.0 g/dL   HCT 43.2 36.0 - 46.0 %   MCV 88.7 78.0 - 100.0 fL   MCH 29.4 26.0  - 34.0 pg   MCHC 33.1 30.0 - 36.0 g/dL   RDW 12.9 11.5 - 15.5 %   Platelets 257 150 - 400 K/uL  Lipase, blood     Status: None   Collection Time: 04/29/16  2:29 PM  Result Value Ref Range   Lipase 24 11 - 51 U/L  I-stat troponin, ED     Status: None   Collection Time: 04/29/16  2:37 PM  Result Value Ref Range   Troponin i, poc 0.01 0.00 - 0.08 ng/mL   Comment 3            Comment: Due to the release kinetics of cTnI, a negative result within the first hours of the onset of symptoms does not rule out myocardial infarction with certainty. If myocardial infarction is still suspected, repeat the test at appropriate intervals.   Urinalysis, Routine w reflex microscopic (not at [facility])     Status: Abnormal   Collection Time: 04/29/16  4:36 PM  Result Value Ref Range   Color, Urine AMBER (A) YELLOW    Comment: BIOCHEMICALS MAY BE AFFECTED BY COLOR   APPearance CLEAR CLEAR   Specific Gravity, Urine 1.009 1.005 - 1.030   pH 6.5 5.0 - 8.0   Glucose, UA NEGATIVE NEGATIVE mg/dL   Hgb urine dipstick NEGATIVE NEGATIVE   Bilirubin Urine NEGATIVE NEGATIVE   Ketones, ur 15 (A) NEGATIVE mg/dL   Protein, ur NEGATIVE NEGATIVE mg/dL   Nitrite POSITIVE (A) NEGATIVE   Leukocytes, UA TRACE (A) NEGATIVE  Urine microscopic-add on     Status: Abnormal   Collection Time: 04/29/16  4:36 PM  Result Value Ref Range   Squamous Epithelial / LPF 0-5 (A) NONE SEEN   WBC, UA 0-5 0 - 5 WBC/hpf   RBC / HPF NONE SEEN 0 - 5 RBC/hpf   Bacteria, UA RARE (A) NONE SEEN   Casts HYALINE CASTS (A) NEGATIVE  I-stat troponin, ED     Status: None   Collection Time: 04/29/16  5:45 PM  Result Value Ref Range   Troponin i, poc 0.01 0.00 - 0.08 ng/mL   Comment 3  Comment: Due to the release kinetics of cTnI, a negative result within the first hours of the onset of symptoms does not rule out myocardial infarction with certainty. If myocardial infarction is still suspected, repeat the test at appropriate  intervals.    US Abdomen Complete  Result Date: 04/29/2016 CLINICAL DATA:  Right upper quadrant abdominal pain. EXAM: ABDOMEN ULTRASOUND COMPLETE COMPARISON:  Acute abdominal series radiographs 12/27/2015 FINDINGS: Gallbladder: An 8 mm non mobile non shadowing polyp is present along the gallbladder wall. The gallbladder wall is thickened at 3.6 mm. There is a sonographic Murphy sign. Sludge is present. Pericholecystic fluid is present. Common bile duct: Diameter: 5.9 mm, upper limits of normal Liver: No focal lesion identified. Within normal limits in parenchymal echogenicity. IVC: No abnormality visualized. Pancreas: Visualized portion unremarkable. Spleen: Size and appearance within normal limits. Right Kidney: Length: 11.6 cm. Kidney is of normal echogenicity. The parenchyma is somewhat thin. No focal mass or stone is present. There is no hydronephrosis. Left Kidney: Length: 11.1 cm, within normal limits. Kidney is of normal echogenicity. The parenchyma is somewhat thin. No focal mass or stone is present. There is no hydronephrosis. Abdominal aorta: No aneurysm visualized. Other findings: None. IMPRESSION: 1. Gallbladder wall thickening, sonographic Murphy's sign, and pericholecystic fluid is highly suggestive of acute cholecystitis 2. Nonmobile polyp within the gallbladder. 3. The common bile duct is at the upper limits of normal. 4. Both kidneys are somewhat atrophic without focal lesion. Electronically Signed   By: San Morelle M.D.   On: 04/29/2016 16:43   Dg Chest Port 1 View  Result Date: 04/29/2016 CLINICAL DATA:  Chest pain, abdominal pain EXAM: PORTABLE CHEST 1 VIEW COMPARISON:  12/27/2015 FINDINGS: Cardiomediastinal silhouette is stable. Mild elevation of the right hemidiaphragm. No acute infiltrate or pleural effusion. No pulmonary edema. IMPRESSION: No active disease. Electronically Signed   By: Lahoma Crocker M.D.   On: 04/29/2016 14:57    Review of Systems  All other systems  reviewed and are negative.   Blood pressure 178/79, pulse 101, temperature 98.2 F (36.8 C), temperature source Oral, resp. rate 26, weight 63.5 kg (140 lb), SpO2 93 %. Physical Exam  Constitutional: She is oriented to person, place, and time. She appears well-developed and well-nourished. She appears distressed.  HENT:  Head: Normocephalic and atraumatic.  Right Ear: External ear normal.  Left Ear: External ear normal.  Nose: Nose normal.  Mouth/Throat: Oropharynx is clear and moist.  Eyes: Conjunctivae are normal. Pupils are equal, round, and reactive to light. Right eye exhibits no discharge. Left eye exhibits no discharge. No scleral icterus.  Neck: Normal range of motion. No tracheal deviation present.  Cardiovascular: Normal rate, regular rhythm, normal heart sounds and intact distal pulses.   No murmur heard. Respiratory: Effort normal. No respiratory distress. She has no wheezes.  GI: Soft. She exhibits no distension. There is tenderness. There is guarding.  Tenderness with guarding in the right upper quadrant  Musculoskeletal: Normal range of motion. She exhibits no edema or tenderness.  Neurological: She is alert and oriented to person, place, and time.  Skin: Skin is warm and dry. No rash noted. She is not diaphoretic. No erythema.  Psychiatric: Her behavior is normal. Judgment normal.     Assessment/Plan Acute cholecystitis  Patient has an ultrasound showing a thickened gallbladder wall with pericholecystic inflammation, sludge, a gallbladder polyp, and a slightly dilated bile duct. Her white blood count is also elevated. Liver function tests are normal. I discussed the findings with  the patient and her family. I recommend admission to the hospital for IV antibiotics, pain control, and eventual laparoscopic cholecystectomy with possible cholangiogram tomorrow depending on the schedule. I discussed the surgical procedure with her briefly. She understands and agrees with the  admission.  Harl Bowie, MD 04/29/2016, 6:47 PM

## 2016-04-29 NOTE — ED Notes (Signed)
Gave pt Sprite, per McKenzie - RN.

## 2016-04-29 NOTE — ED Notes (Addendum)
Patient transported to Ultrasound 

## 2016-04-29 NOTE — ED Provider Notes (Addendum)
[facility] DEPT Provider Note   CSN: [id number] Arrival date & time: 04/29/16  1412     History   Chief Complaint Chief Complaint  Patient presents with  . Chest Pain    HPI Crystal Kidd is a 70 y.o. female.  Patient c/o mid to lower chest pain, onset this AM.  Patient indicates initially felt sick 2 nights ago, when developed nv illness, had approximately 10 episodes emesis throughout the night, not bloody or bilious. Yesterday felt improved. Today pain in epigastric and lower sternal and left lower chest. Radiates to mid back. Pain dull, moderate, persistent. Nausea. No diaphoresis. No sob. Pain is not pleuritic. Denies leg pain or swelling. No recent surgery, immobility, travel or trauma. Denies other recent cp or discomfort, and denies any exertional cp. No flank pain. No dysuria or gu c/o although hx uti. Denies cough or uri c/o. No ever or chills. Last bm yesterday.    The history is provided by the patient.    Past Medical History:  Diagnosis Date  . Allergic rhinoconjunctivitis   . Allergy 1992  . Arthritis   . Cataract   . Diverticulosis   . GERD (gastroesophageal reflux disease)    PMH of esophageal stricture  . Hyperlipidemia   . Hypertension   . Osteopenia    BMD @[facility]   . Thyroid disease    hypothyroidism  . Transfusion history 1968   post partum  . Trapezius muscle spasm 04/13/2015  . Vitiligo     Patient Active Problem List   Diagnosis Date Noted  . Ileus ([place]) 01/02/2016  . UTI (urinary tract infection) 01/02/2016  . Thoracic aortic atherosclerosis ([place]) 01/02/2016  . Swelling of right upper extremity 12/02/2015  . Trapezius muscle spasm 04/13/2015  . Stye 04/13/2015  . Medicare annual wellness visit, subsequent 06/14/2014  . Pain in joint, shoulder region 12/06/2013  . S/P hysterectomy with oophorectomy 12/06/2013  . Cataract 12/06/2013  . Transfusion history   . Abnormal CT scan of lung 08/24/2012  . New onset of headaches after age 79  08/22/2011  . Cervical disc disorder with radiculopathy of cervical region 05/21/2010  . OTHER DYSPHAGIA 05/31/2009  . VITAMIN D DEFICIENCY 05/16/2009  . Hypothyroidism 12/26/2007  . Hyperlipidemia LDL goal <100 12/26/2007  . Essential hypertension 12/26/2007  . GERD 12/26/2007  . Diverticulosis of large intestine 12/26/2007  . VITILIGO 12/31/2006  . OSTEOPENIA 12/31/2006    Past Surgical History:  Procedure Laterality Date  . ABDOMINAL HYSTERECTOMY     for dysfunctional menses; USO with TAH  . APPENDECTOMY    . BREAST SURGERY     biopsy X 2  . COLONOSCOPY     X2; Tics; Dr Fuller Plan, GI  . [place]    . FRACTURE SURGERY  2003or 2004  . ROTATOR CUFF REPAIR     R shoulder  . TUBAL LIGATION     befor hysterectomy  . UPPER GASTROINTESTINAL ENDOSCOPY      OB History    No data available       Home Medications    Prior to Admission medications   Medication Sig Start Date End Date Taking? Authorizing Provider  Acetaminophen 500 MG coapsule  03/15/16   Historical Provider, MD  baclofen (LIORESAL) 10 MG tablet Take 0.5 tablets (5 mg total) by mouth 3 (three) times daily as needed for muscle spasms. 04/12/15   Leone Haven, MD  benzonatate (TESSALON) 100 MG capsule  04/05/16   Historical Provider, MD  calcium carbonate (TUMS) 500 MG chewable tablet Chew 1 tablet by mouth as needed.     Historical Provider, MD  cefdinir (OMNICEF) 300 MG capsule Take 1 capsule (300 mg total) by mouth 2 (two) times daily. 01/05/16   [name], MD  cephALEXin (KEFLEX) 500 MG capsule Take 1 capsule (500 mg total) by mouth 2 (two) times daily. 12/27/15   Quintella Reichert, MD  Cholecalciferol (VITAMIN D3) 2000 UNITS TABS Take 2,000 Units by mouth daily. Reported on 01/01/2016    Historical Provider, MD  [place]kins Take by mouth.      Historical Provider, MD  fluticasone (FLONASE) 50 MCG/ACT nasal spray USE 2 SPRAYS IN EACH NOSTRIL EVERY DAY 04/16/16   [name], MD  gabapentin (NEURONTIN) 100 MG capsule TAKE 1 CAPSULE (100 MG TOTAL) BY MOUTH 3 (THREE) TIMES DAILY. EVERY 8 HOURS AS NEEDED 06/17/15   [name], MD  hydrochlorothiazide (HYDRODIURIL) 25 MG tablet TAKE 1/2 TABLET DAILY AS NEEDED 01/15/16   [name], MD  levothyroxine (SYNTHROID, LEVOTHROID) 50 MCG tablet TAKE 1 TABLET DAILY EXCEPT TAKE 1 AND 1/2 TABLETS (75MCG) MONDAY, [facility] AND FRIDAY AS DIRECTED 05/25/15   [name], MD  loratadine (CLARITIN) 10 MG tablet Take 10 mg by mouth daily.    Historical Provider, MD  losartan (COZAAR) 50 MG tablet TAKE 1 AND 1/2 TABLETS EVERY DAY 04/03/16   [name], MD  Multiple Vitamin (MULTIVITAMIN) tablet Take 2 tablets by mouth daily.     Historical Provider, MD  omeprazole (PRILOSEC) 40 MG capsule TAKE 1 CAPSULE EVERY DAY 09/06/15   [name], MD  Phenylephrine-Acetaminophen 5-325 MG TABS  03/15/16   Historical Provider, MD  promethazine (PHENERGAN) 12.5 MG tablet Take 1 tablet (12.5 mg total) by mouth every 8 (eight) hours as needed for nausea or vomiting. 05/26/15   [name], MD  simvastatin (ZOCOR) 20 MG tablet TAKE 1 TABLET EVERY EVENING 03/20/16   [name], MD  ZYRTEC ALLERGY 10 MG CAPS  03/18/16   Historical Provider, MD    Family History Family History  Problem Relation Age of Onset  . Breast cancer Sister     mammograms @ [facility]  . Colon polyps Sister   . Colon cancer Maternal Aunt   . Cancer Maternal Aunt 80    colon cA  . COPD Father   . Coronary artery disease Brother   . COPD Brother   . Colon polyps Brother   . Diabetes Neg Hx   . Stroke Neg Hx   . Heart disease Neg Hx   . Esophageal cancer Neg Hx   . Rectal cancer Neg Hx   . Cancer Sister 81    BREAST  . Cancer Mother     breast&stomach  . Stomach cancer Mother   . Breast cancer Mother   . COPD Son     Social History Social History  Substance Use Topics  . Smoking status: Never Smoker  . Smokeless tobacco: Never Used  . Alcohol  use Yes    1 - 6 Glasses of wine per week     Comment: have wine 1 glass every 6 month     Allergies   Besivance [besifloxacin hcl]; Alendronate sodium; Dilaudid [hydromorphone hcl]; and Ibandronate sodium   Review of Systems Review of Systems  Constitutional: Negative for chills and fever.  HENT: Negative for sore throat.   Eyes:  Negative for redness.  Respiratory: Negative for shortness of breath.   Cardiovascular: Positive for chest pain. Negative for leg swelling.  Gastrointestinal: Positive for nausea and vomiting. Negative for abdominal pain, constipation and diarrhea.  Genitourinary: Negative for dysuria and flank pain.  Musculoskeletal: Negative for back pain and neck pain.  Skin: Negative for rash.  Neurological: Negative for headaches.  Hematological: Does not bruise/bleed easily.  Psychiatric/Behavioral: Negative for confusion.     Physical Exam Updated Vital Signs BP 191/85 (BP Location: Right Arm)   Pulse 75   Temp 98.2 F (36.8 C) (Oral)   Resp 17   SpO2 95%   Physical Exam  Constitutional: She appears well-developed and well-nourished. No distress.  HENT:  Mouth/Throat: Oropharynx is clear and moist.  Eyes: Conjunctivae are normal. No scleral icterus.  Neck: Neck supple. No tracheal deviation present.  Cardiovascular: Normal rate, regular rhythm, normal heart sounds and intact distal pulses.  Exam reveals no gallop and no friction rub.   No murmur heard. Pulmonary/Chest: Effort normal and breath sounds normal. No respiratory distress. She exhibits no tenderness.  Abdominal: Soft. Normal appearance and bowel sounds are normal. She exhibits no distension and no mass. There is no tenderness. There is no rebound and no guarding. No hernia.  Genitourinary:  Genitourinary Comments: No cva tenderness  Musculoskeletal: She exhibits no edema or tenderness.  Neurological: She is alert.  Skin: Skin is warm and dry. No rash noted. She is not diaphoretic.  No  shingles/rash in area of pain  Psychiatric: She has a normal mood and affect.  Nursing note and vitals reviewed.    ED Treatments / Results  Labs (all labs ordered are listed, but only abnormal results are displayed) Results for orders placed or performed during the hospital encounter of 04/29/16  Comprehensive metabolic panel  Result Value Ref Range   Sodium 136 135 - 145 mmol/L   Potassium 3.3 (L) 3.5 - 5.1 mmol/L   Chloride 102 101 - 111 mmol/L   CO2 26 22 - 32 mmol/L   Glucose, Bld 120 (H) 65 - 99 mg/dL   BUN 8 6 - 20 mg/dL   Creatinine, Ser 0.71 0.44 - 1.00 mg/dL   Calcium 9.2 8.9 - 10.3 mg/dL   Total Protein 6.9 6.5 - 8.1 g/dL   Albumin 3.8 3.5 - 5.0 g/dL   AST 20 15 - 41 U/L   ALT 14 14 - 54 U/L   Alkaline Phosphatase 89 38 - 126 U/L   Total Bilirubin 0.7 0.3 - 1.2 mg/dL   GFR calc non Af Amer >60 >60 mL/min   GFR calc Af Amer >60 >60 mL/min   Anion gap 8 5 - 15  CBC  Result Value Ref Range   WBC 12.8 (H) 4.0 - 10.5 K/uL   RBC 4.87 3.87 - 5.11 MIL/uL   Hemoglobin 14.3 12.0 - 15.0 g/dL   HCT 43.2 36.0 - 46.0 %   MCV 88.7 78.0 - 100.0 fL   MCH 29.4 26.0 - 34.0 pg   MCHC 33.1 30.0 - 36.0 g/dL   RDW 12.9 11.5 - 15.5 %   Platelets 257 150 - 400 K/uL  Lipase, blood  Result Value Ref Range   Lipase 24 11 - 51 U/L  I-stat troponin, ED  Result Value Ref Range   Troponin i, poc 0.01 0.00 - 0.08 ng/mL   Comment 3           Dg Chest Port 1 View  Result Date: 04/29/2016  CLINICAL DATA:  Chest pain, abdominal pain EXAM: PORTABLE CHEST 1 VIEW COMPARISON:  12/27/2015 FINDINGS: Cardiomediastinal silhouette is stable. Mild elevation of the right hemidiaphragm. No acute infiltrate or pleural effusion. No pulmonary edema. IMPRESSION: No active disease. Electronically Signed   By: Lahoma Crocker M.D.   On: 04/29/2016 14:57    EKG  EKG Interpretation  Date/Time:  Monday April 29 2016 14:13:24 EDT Ventricular Rate:  70 PR Interval:    QRS Duration: 98 QT Interval:  414 QTC  Calculation: 447 R Axis:   6 Text Interpretation:  Sinus rhythm No previous tracing Confirmed by Ashok Cordia  MD, Lennette Bihari ([id number]) on 04/29/2016 2:35:31 PM       Radiology Dg Chest Port 1 View  Result Date: 04/29/2016 CLINICAL DATA:  Chest pain, abdominal pain EXAM: PORTABLE CHEST 1 VIEW COMPARISON:  12/27/2015 FINDINGS: Cardiomediastinal silhouette is stable. Mild elevation of the right hemidiaphragm. No acute infiltrate or pleural effusion. No pulmonary edema. IMPRESSION: No active disease. Electronically Signed   By: Lahoma Crocker M.D.   On: 04/29/2016 14:57    Procedures Procedures (including critical care time)  Medications Ordered in ED Medications - No data to display   Initial Impression / Assessment and Plan / ED Course  I have reviewed the triage vital signs and the nursing notes.  Pertinent labs & imaging results that were available during my care of the patient were reviewed by me and considered in my medical decision making (see chart for details).  Clinical Course    Iv ns bolus. Patient had zofran pta, nausea improved.  Ecg. Cxr. Labs.  Reviewed nursing notes and prior charts for additional history.   Initial trop negative.  UA remains pending.  Give epigastric area pain radiating to back will also get u/s r/o gb disease/gallstones.   Kcl po, trial po fluids.  1510 UA and repeat troponin pending, u/s pending - signed out to Dr Sabra Heck to check ua, repeat trop,u/s,   recheck pt, dispo appropriately.     Final Clinical Impressions(s) / ED Diagnoses   Final diagnoses:  None    New Prescriptions New Prescriptions   No medications on file        Lajean Saver, MD 04/29/16 1523

## 2016-04-29 NOTE — ED Provider Notes (Signed)
The patient was seen and examined by myself after the ultrasound was completed, she has ongoing pain in the right upper quadrant with tenderness to palpation and a Murphy sign. Ultrasound confirms cholecystitis, this was discussed with Dr. Ninfa Linden who very kindly will see the patient for surgical consultation and admission. Nothing by mouth status verified   Noemi Chapel, MD 04/29/16 [phone]

## 2016-04-30 ENCOUNTER — Encounter (HOSPITAL_COMMUNITY): Payer: Self-pay | Admitting: Anesthesiology

## 2016-04-30 ENCOUNTER — Encounter (HOSPITAL_COMMUNITY)
Admission: EM | Disposition: A | Discharge status: Home / Self Care | Payer: Self-pay | Source: Home / Self Care | Attending: Emergency Medicine

## 2016-04-30 ENCOUNTER — Observation Stay (HOSPITAL_COMMUNITY): Payer: Commercial Managed Care - HMO | Admitting: Anesthesiology

## 2016-04-30 DIAGNOSIS — Z79899 Other long term (current) drug therapy: Secondary | ICD-10-CM | POA: Diagnosis not present

## 2016-04-30 DIAGNOSIS — K810 Acute cholecystitis: Secondary | ICD-10-CM | POA: Diagnosis not present

## 2016-04-30 DIAGNOSIS — Z792 Long term (current) use of antibiotics: Secondary | ICD-10-CM | POA: Diagnosis not present

## 2016-04-30 DIAGNOSIS — I10 Essential (primary) hypertension: Secondary | ICD-10-CM | POA: Diagnosis not present

## 2016-04-30 DIAGNOSIS — K819 Cholecystitis, unspecified: Secondary | ICD-10-CM | POA: Diagnosis not present

## 2016-04-30 DIAGNOSIS — K219 Gastro-esophageal reflux disease without esophagitis: Secondary | ICD-10-CM | POA: Diagnosis not present

## 2016-04-30 DIAGNOSIS — E039 Hypothyroidism, unspecified: Secondary | ICD-10-CM | POA: Diagnosis not present

## 2016-04-30 DIAGNOSIS — K812 Acute cholecystitis with chronic cholecystitis: Secondary | ICD-10-CM | POA: Diagnosis not present

## 2016-04-30 DIAGNOSIS — E785 Hyperlipidemia, unspecified: Secondary | ICD-10-CM | POA: Diagnosis not present

## 2016-04-30 HISTORY — PX: CHOLECYSTECTOMY: SHX55

## 2016-04-30 LAB — CBC
HCT: 42.2 % (ref 36.0–46.0)
HEMOGLOBIN: 13.8 g/dL (ref 12.0–15.0)
MCH: 29.4 pg (ref 26.0–34.0)
MCHC: 32.7 g/dL (ref 30.0–36.0)
MCV: 89.8 fL (ref 78.0–100.0)
Platelets: 229 10*3/uL (ref 150–400)
RBC: 4.7 MIL/uL (ref 3.87–5.11)
RDW: 13.2 % (ref 11.5–15.5)
WBC: 20 10*3/uL — AB (ref 4.0–10.5)

## 2016-04-30 LAB — COMPREHENSIVE METABOLIC PANEL
ALK PHOS: 77 U/L (ref 38–126)
ALT: 34 U/L (ref 14–54)
ANION GAP: 7 (ref 5–15)
AST: 46 U/L — ABNORMAL HIGH (ref 15–41)
Albumin: 3 g/dL — ABNORMAL LOW (ref 3.5–5.0)
BILIRUBIN TOTAL: 0.6 mg/dL (ref 0.3–1.2)
BUN: 7 mg/dL (ref 6–20)
CALCIUM: 8.6 mg/dL — AB (ref 8.9–10.3)
CO2: 25 mmol/L (ref 22–32)
Chloride: 104 mmol/L (ref 101–111)
Creatinine, Ser: 0.89 mg/dL (ref 0.44–1.00)
GLUCOSE: 143 mg/dL — AB (ref 65–99)
POTASSIUM: 3.8 mmol/L (ref 3.5–5.1)
Sodium: 136 mmol/L (ref 135–145)
TOTAL PROTEIN: 5.8 g/dL — AB (ref 6.5–8.1)

## 2016-04-30 LAB — SURGICAL PCR SCREEN
MRSA, PCR: NEGATIVE
Staphylococcus aureus: POSITIVE — AB

## 2016-04-30 SURGERY — LAPAROSCOPIC CHOLECYSTECTOMY WITH INTRAOPERATIVE CHOLANGIOGRAM
Anesthesia: General | Site: Abdomen

## 2016-04-30 MED ORDER — SODIUM CHLORIDE 0.9 % IV SOLN
INTRAVENOUS | Status: DC | PRN
  Administered 2016-04-30: .09 mL

## 2016-04-30 MED ORDER — PHENAZOPYRIDINE HCL 100 MG PO TABS
100.0000 mg | ORAL_TABLET | Freq: Every day | ORAL | Status: DC | PRN
  Filled 2016-04-30: qty 1

## 2016-04-30 MED ORDER — LACTATED RINGERS IV SOLN
INTRAVENOUS | Status: DC
  Administered 2016-04-30: 12:00:00 via INTRAVENOUS

## 2016-04-30 MED ORDER — PROPOFOL 10 MG/ML IV BOLUS
INTRAVENOUS | Status: AC
  Filled 2016-04-30: qty 20

## 2016-04-30 MED ORDER — WHITE PETROLATUM GEL
Status: AC
  Administered 2016-04-30: 05:00:00
  Filled 2016-04-30: qty 1

## 2016-04-30 MED ORDER — PROMETHAZINE HCL 25 MG/ML IJ SOLN: 6.2500 mg | INTRAMUSCULAR | Status: DC | PRN

## 2016-04-30 MED ORDER — PHENYLEPHRINE HCL 10 MG/ML IJ SOLN
INTRAVENOUS | Status: DC | PRN
  Administered 2016-04-30: 25 ug/min via INTRAVENOUS

## 2016-04-30 MED ORDER — ROCURONIUM BROMIDE 10 MG/ML (PF) SYRINGE
PREFILLED_SYRINGE | INTRAVENOUS | Status: DC | PRN
  Administered 2016-04-30: 50 mg via INTRAVENOUS

## 2016-04-30 MED ORDER — MEPERIDINE HCL 25 MG/ML IJ SOLN: 6.2500 mg | INTRAMUSCULAR | Status: DC | PRN

## 2016-04-30 MED ORDER — MORPHINE SULFATE (PF) 2 MG/ML IV SOLN
1.0000 mg | INTRAVENOUS | Status: DC | PRN
  Administered 2016-04-30: 4 mg via INTRAVENOUS
  Filled 2016-04-30: qty 2

## 2016-04-30 MED ORDER — SODIUM CHLORIDE 0.9 % IR SOLN
Status: DC | PRN
  Administered 2016-04-30: 1000 mL

## 2016-04-30 MED ORDER — IOPAMIDOL (ISOVUE-300) INJECTION 61%
INTRAVENOUS | Status: AC
  Filled 2016-04-30: qty 50

## 2016-04-30 MED ORDER — LIDOCAINE 2% (20 MG/ML) 5 ML SYRINGE
INTRAMUSCULAR | Status: DC | PRN
  Administered 2016-04-30: 40 mg via INTRAVENOUS

## 2016-04-30 MED ORDER — DIPHENHYDRAMINE HCL 50 MG/ML IJ SOLN: 12.5000 mg | Freq: Four times a day (QID) | INTRAMUSCULAR | Status: DC | PRN

## 2016-04-30 MED ORDER — FENTANYL CITRATE (PF) 100 MCG/2ML IJ SOLN
25.0000 ug | INTRAMUSCULAR | Status: DC | PRN
  Administered 2016-04-30 (×2): 50 ug via INTRAVENOUS

## 2016-04-30 MED ORDER — LACTATED RINGERS IV SOLN: INTRAVENOUS | Status: DC | PRN

## 2016-04-30 MED ORDER — SUCCINYLCHOLINE CHLORIDE 20 MG/ML IJ SOLN
INTRAMUSCULAR | Status: DC | PRN
  Administered 2016-04-30: 50 mg via INTRAVENOUS

## 2016-04-30 MED ORDER — ONDANSETRON HCL 4 MG/2ML IJ SOLN
INTRAMUSCULAR | Status: DC | PRN
  Administered 2016-04-30: 4 mg via INTRAVENOUS

## 2016-04-30 MED ORDER — DIPHENHYDRAMINE HCL 12.5 MG/5ML PO ELIX: 12.5000 mg | ORAL_SOLUTION | Freq: Four times a day (QID) | ORAL | Status: DC | PRN

## 2016-04-30 MED ORDER — FENTANYL CITRATE (PF) 100 MCG/2ML IJ SOLN
INTRAMUSCULAR | Status: AC
  Administered 2016-04-30: 50 ug via INTRAVENOUS
  Filled 2016-04-30: qty 2

## 2016-04-30 MED ORDER — ENOXAPARIN SODIUM 40 MG/0.4ML ~~LOC~~ SOLN: 40.0000 mg | SUBCUTANEOUS | Status: DC

## 2016-04-30 MED ORDER — ENOXAPARIN SODIUM 40 MG/0.4ML ~~LOC~~ SOLN
40.0000 mg | SUBCUTANEOUS | Status: DC
  Administered 2016-05-01: 40 mg via SUBCUTANEOUS
  Filled 2016-04-30: qty 0.4

## 2016-04-30 MED ORDER — SUGAMMADEX SODIUM 500 MG/5ML IV SOLN
INTRAVENOUS | Status: DC | PRN
  Administered 2016-04-30: 250 mg via INTRAVENOUS

## 2016-04-30 MED ORDER — FENTANYL CITRATE (PF) 100 MCG/2ML IJ SOLN
INTRAMUSCULAR | Status: AC
  Filled 2016-04-30: qty 2

## 2016-04-30 MED ORDER — CHLORHEXIDINE GLUCONATE CLOTH 2 % EX PADS
6.0000 | MEDICATED_PAD | Freq: Every day | CUTANEOUS | Status: DC
  Administered 2016-04-30 – 2016-05-02 (×2): 6 via TOPICAL

## 2016-04-30 MED ORDER — MIDAZOLAM HCL 2 MG/2ML IJ SOLN
INTRAMUSCULAR | Status: AC
  Filled 2016-04-30: qty 2

## 2016-04-30 MED ORDER — DEXAMETHASONE SODIUM PHOSPHATE 10 MG/ML IJ SOLN
INTRAMUSCULAR | Status: DC | PRN
  Administered 2016-04-30: 10 mg via INTRAVENOUS

## 2016-04-30 MED ORDER — 0.9 % SODIUM CHLORIDE (POUR BTL) OPTIME
TOPICAL | Status: DC | PRN
  Administered 2016-04-30: 1000 mL

## 2016-04-30 MED ORDER — BUPIVACAINE-EPINEPHRINE (PF) 0.25% -1:200000 IJ SOLN
INTRAMUSCULAR | Status: AC
  Filled 2016-04-30: qty 30

## 2016-04-30 MED ORDER — MIDAZOLAM HCL 2 MG/2ML IJ SOLN: 0.5000 mg | Freq: Once | INTRAMUSCULAR | Status: DC | PRN

## 2016-04-30 MED ORDER — HEMOSTATIC AGENTS (NO CHARGE) OPTIME
TOPICAL | Status: DC | PRN
  Administered 2016-04-30: 1 via TOPICAL

## 2016-04-30 MED ORDER — ONDANSETRON HCL 4 MG/2ML IJ SOLN
INTRAMUSCULAR | Status: AC
  Filled 2016-04-30: qty 2

## 2016-04-30 MED ORDER — FENTANYL CITRATE (PF) 100 MCG/2ML IJ SOLN
INTRAMUSCULAR | Status: DC | PRN
  Administered 2016-04-30 (×3): 50 ug via INTRAVENOUS

## 2016-04-30 MED ORDER — LIDOCAINE 2% (20 MG/ML) 5 ML SYRINGE
INTRAMUSCULAR | Status: AC
  Filled 2016-04-30: qty 5

## 2016-04-30 MED ORDER — MUPIROCIN 2 % EX OINT
1.0000 "application " | TOPICAL_OINTMENT | Freq: Two times a day (BID) | CUTANEOUS | Status: DC
  Administered 2016-04-30 – 2016-05-02 (×5): 1 via NASAL
  Filled 2016-04-30 (×2): qty 22

## 2016-04-30 MED ORDER — POTASSIUM CHLORIDE IN NACL 20-0.9 MEQ/L-% IV SOLN
INTRAVENOUS | Status: DC
  Administered 2016-04-30: 100 mL/h via INTRAVENOUS
  Administered 2016-04-30 – 2016-05-02 (×3): via INTRAVENOUS
  Filled 2016-04-30 (×4): qty 1000

## 2016-04-30 MED ORDER — DEXAMETHASONE SODIUM PHOSPHATE 10 MG/ML IJ SOLN
INTRAMUSCULAR | Status: AC
  Filled 2016-04-30: qty 1

## 2016-04-30 MED ORDER — DEXTROSE 5 % IV SOLN
2.0000 g | Freq: Every day | INTRAVENOUS | Status: DC
  Administered 2016-04-30 – 2016-05-02 (×3): 2 g via INTRAVENOUS
  Filled 2016-04-30 (×4): qty 2

## 2016-04-30 MED ORDER — ROCURONIUM BROMIDE 10 MG/ML (PF) SYRINGE
PREFILLED_SYRINGE | INTRAVENOUS | Status: AC
  Filled 2016-04-30: qty 10

## 2016-04-30 MED ORDER — BUPIVACAINE-EPINEPHRINE 0.25% -1:200000 IJ SOLN
INTRAMUSCULAR | Status: DC | PRN
  Administered 2016-04-30: 20 mL

## 2016-04-30 MED ORDER — PROPOFOL 10 MG/ML IV BOLUS
INTRAVENOUS | Status: DC | PRN
  Administered 2016-04-30: 100 mg via INTRAVENOUS

## 2016-04-30 SURGICAL SUPPLY — 51 items
ADH SKN CLS LQ APL DERMABOND (GAUZE/BANDAGES/DRESSINGS) ×1
APPLIER CLIP 5 13 M/L LIGAMAX5 (MISCELLANEOUS) ×2
APR CLP MED LRG 5 ANG JAW (MISCELLANEOUS) ×1
BAG SPEC RTRVL 10 TROC 200 (ENDOMECHANICALS) ×1
CANISTER SUCTION 2500CC (MISCELLANEOUS) ×2 IMPLANT
CHLORAPREP W/TINT 26ML (MISCELLANEOUS) ×2 IMPLANT
CLIP APPLIE 5 13 M/L LIGAMAX5 (MISCELLANEOUS) ×1 IMPLANT
COVER MAYO STAND STRL (DRAPES) ×2 IMPLANT
COVER SURGICAL LIGHT HANDLE (MISCELLANEOUS) ×2 IMPLANT
DERMABOND ADHESIVE PROPEN (GAUZE/BANDAGES/DRESSINGS) ×1
DERMABOND ADVANCED .7 DNX6 (GAUZE/BANDAGES/DRESSINGS) IMPLANT
DRAPE C-ARM 42X72 X-RAY (DRAPES) ×2 IMPLANT
ELECT REM PT RETURN 9FT ADLT (ELECTROSURGICAL) ×2
ELECTRODE REM PT RTRN 9FT ADLT (ELECTROSURGICAL) ×1 IMPLANT
FILTER SMOKE EVAC LAPAROSHD (FILTER) ×1 IMPLANT
GLOVE BIO SURGEON STRL SZ8 (GLOVE) ×2 IMPLANT
GLOVE BIOGEL PI IND STRL 7.0 (GLOVE) IMPLANT
GLOVE BIOGEL PI IND STRL 7.5 (GLOVE) IMPLANT
GLOVE BIOGEL PI IND STRL 8 (GLOVE) ×1 IMPLANT
GLOVE BIOGEL PI INDICATOR 7.0 (GLOVE) ×1
GLOVE BIOGEL PI INDICATOR 7.5 (GLOVE) ×1
GLOVE BIOGEL PI INDICATOR 8 (GLOVE) ×2
GLOVE SS N UNI LF 7.0 STRL (GLOVE) ×1 IMPLANT
GLOVE SURG SS PI 6.5 STRL IVOR (GLOVE) ×1 IMPLANT
GOWN STRL REUS W/ TWL LRG LVL3 (GOWN DISPOSABLE) ×2 IMPLANT
GOWN STRL REUS W/ TWL XL LVL3 (GOWN DISPOSABLE) ×3 IMPLANT
GOWN STRL REUS W/TWL LRG LVL3 (GOWN DISPOSABLE) ×6
GOWN STRL REUS W/TWL XL LVL3 (GOWN DISPOSABLE) ×6
KIT BASIN OR (CUSTOM PROCEDURE TRAY) ×2 IMPLANT
KIT ROOM TURNOVER OR (KITS) ×2 IMPLANT
L-HOOK LAP DISP 36CM (ELECTROSURGICAL) ×2
LHOOK LAP DISP 36CM (ELECTROSURGICAL) ×1 IMPLANT
LIQUID BAND (GAUZE/BANDAGES/DRESSINGS) ×2 IMPLANT
NEEDLE 22X1 1/2 (OR ONLY) (NEEDLE) ×2 IMPLANT
NS IRRIG 1000ML POUR BTL (IV SOLUTION) ×2 IMPLANT
PAD ARMBOARD 7.5X6 YLW CONV (MISCELLANEOUS) ×2 IMPLANT
PENCIL BUTTON HOLSTER BLD 10FT (ELECTRODE) ×2 IMPLANT
POUCH RETRIEVAL ECOSAC 10 (ENDOMECHANICALS) ×1 IMPLANT
POUCH RETRIEVAL ECOSAC 10MM (ENDOMECHANICALS) ×1
SCISSORS LAP 5X35 DISP (ENDOMECHANICALS) ×2 IMPLANT
SET CHOLANGIOGRAPH 5 50 .035 (SET/KITS/TRAYS/PACK) ×2 IMPLANT
SET IRRIG TUBING LAPAROSCOPIC (IRRIGATION / IRRIGATOR) ×2 IMPLANT
SLEEVE ENDOPATH XCEL 5M (ENDOMECHANICALS) ×4 IMPLANT
SPECIMEN JAR SMALL (MISCELLANEOUS) ×2 IMPLANT
SUT VIC AB 4-0 PS2 27 (SUTURE) ×2 IMPLANT
TOWEL OR 17X24 6PK STRL BLUE (TOWEL DISPOSABLE) ×2 IMPLANT
TOWEL OR 17X26 10 PK STRL BLUE (TOWEL DISPOSABLE) ×2 IMPLANT
TRAY LAPAROSCOPIC MC (CUSTOM PROCEDURE TRAY) ×2 IMPLANT
TROCAR XCEL BLUNT TIP 100MML (ENDOMECHANICALS) ×2 IMPLANT
TROCAR XCEL NON-BLD 5MMX100MML (ENDOMECHANICALS) ×2 IMPLANT
TUBING INSUFFLATION (TUBING) ×2 IMPLANT

## 2016-04-30 NOTE — Progress Notes (Signed)
  Subjective: Nauseated, pain less  Objective: Vital signs in last 24 hours: Temp:  [98.2 F (36.8 C)-99.6 F (37.6 C)] 99.1 F (37.3 C) (09/19 0603) Pulse Rate:  [71-122] 111 (09/19 0603) Resp:  [15-36] 16 (09/19 0603) BP: (117-194)/(44-102) 130/59 (09/19 0603) SpO2:  [90 %-97 %] 94 % (09/19 0603) Weight:  [63 kg (139 lb)-63.5 kg (140 lb)] 63 kg (139 lb) (09/19 0321) Last BM Date: 04/27/16  Intake/Output from previous day: 09/18 0701 - 09/19 0700 In: 1331.7 [I.V.:781.7; IV Piggyback:550] Out: 350 [Urine:350] Intake/Output this shift: No intake/output data recorded.  General appearance: cooperative Resp: clear to auscultation bilaterally Cardio: regular rate and rhythm GI: soft, mild tenderness RUQ  Lab Results:   Recent Labs  04/29/16 1429 04/30/16 0516  WBC 12.8* 20.0*  HGB 14.3 13.8  HCT 43.2 42.2  PLT 257 229   BMET  Recent Labs  04/29/16 1429 04/30/16 0516  NA 136 136  K 3.3* 3.8  CL 102 104  CO2 26 25  GLUCOSE 120* 143*  BUN 8 7  CREATININE 0.71 0.89  CALCIUM 9.2 8.6*   PT/INR No results for input(s): LABPROT, INR in the last 72 hours. ABG No results for input(s): PHART, HCO3 in the last 72 hours.  Invalid input(s): PCO2, PO2  Studies/Results: US Abdomen Complete  Result Date: 04/29/2016 CLINICAL DATA:  Right upper quadrant abdominal pain. EXAM: ABDOMEN ULTRASOUND COMPLETE COMPARISON:  Acute abdominal series radiographs 12/27/2015 FINDINGS: Gallbladder: An 8 mm non mobile non shadowing polyp is present along the gallbladder wall. The gallbladder wall is thickened at 3.6 mm. There is a sonographic Murphy sign. Sludge is present. Pericholecystic fluid is present. Common bile duct: Diameter: 5.9 mm, upper limits of normal Liver: No focal lesion identified. Within normal limits in parenchymal echogenicity. IVC: No abnormality visualized. Pancreas: Visualized portion unremarkable. Spleen: Size and appearance within normal limits. Right Kidney:  Length: 11.6 cm. Kidney is of normal echogenicity. The parenchyma is somewhat thin. No focal mass or stone is present. There is no hydronephrosis. Left Kidney: Length: 11.1 cm, within normal limits. Kidney is of normal echogenicity. The parenchyma is somewhat thin. No focal mass or stone is present. There is no hydronephrosis. Abdominal aorta: No aneurysm visualized. Other findings: None. IMPRESSION: 1. Gallbladder wall thickening, sonographic Murphy's sign, and pericholecystic fluid is highly suggestive of acute cholecystitis 2. Nonmobile polyp within the gallbladder. 3. The common bile duct is at the upper limits of normal. 4. Both kidneys are somewhat atrophic without focal lesion. Electronically Signed   By: San Morelle M.D.   On: 04/29/2016 16:43   Dg Chest Port 1 View  Result Date: 04/29/2016 CLINICAL DATA:  Chest pain, abdominal pain EXAM: PORTABLE CHEST 1 VIEW COMPARISON:  12/27/2015 FINDINGS: Cardiomediastinal silhouette is stable. Mild elevation of the right hemidiaphragm. No acute infiltrate or pleural effusion. No pulmonary edema. IMPRESSION: No active disease. Electronically Signed   By: Lahoma Crocker M.D.   On: 04/29/2016 14:57    Anti-infectives: Anti-infectives    Start     Dose/Rate Route Frequency Ordered Stop   04/30/16 0330  cefTRIAXone (ROCEPHIN) 2 g in dextrose 5 % 50 mL IVPB     2 g 100 mL/hr over 30 Minutes Intravenous Daily 04/30/16 0318        Assessment/Plan: Cholecystitis - for lap chole/IOC today. Procedure, risks, and benefits discussed. She agrees. Continue Rocephin.   LOS: 0 days    Crystal Kidd E 04/30/2016

## 2016-04-30 NOTE — Op Note (Signed)
04/29/2016 - 04/30/2016  1:09 PM  PATIENT:  Crystal Kidd  70 y.o. female  PRE-OPERATIVE DIAGNOSIS:  CHOLECYSTITIS  POST-OPERATIVE DIAGNOSIS:  CHOLECYSTITIS  PROCEDURE:  Procedure(s): LAPAROSCOPIC CHOLECYSTECTOMY  SURGEON:  Surgeon(s): Georganna Skeans, MD  ASSISTANTS: none   ANESTHESIA:   local and general  EBL:  No intake/output data recorded.  BLOOD ADMINISTERED:none  DRAINS: none   SPECIMEN:  Excision  DISPOSITION OF SPECIMEN:  PATHOLOGY  COUNTS:  YES  DICTATION: .Dragon Dictation Findings: severe acute cholecystitis with hydrops  Procedure in detail: Mida is brought for cholecystectomy. She was identified in the preop holding area. Informed consent was obtained. She is receiving intravenous antibiotics. She was brought to the operating room and general endotracheal anesthesia was administered by the anesthesia staff. Her abdomen was prepped and draped in a sterile fashion. We did a time out procedure.The supraumbilical region was infiltrated with local. Supraumbilical incision was made. Subcutaneous tissues were dissected down revealing the anterior fascia. This was divided sharply along the midline. Peritoneal cavity was entered under direct vision without complication. A 0 Vicryl pursestring was placed around the fascial opening. Hassan trocar was inserted into the abdomen. The abdomen was insufflated with carbon dioxide in standard fashion. Under direct vision a 5 mm epigastric and 5 mm right abdomen port 2 were placed. Local was used at each port site. Laparoscopic expiration revealed a severely inflamed gallbladder. It was tense. It was drained with the [place] system. This revealed hydrops of the gallbladder. The dome was then retracted superior medially. The infundibulum was retracted inferolaterally. Dissection began laterally and progressed medially identifying the cystic duct. It was very short. Dissection continued until a critical view was obtained between the cystic  duct, gallbladder, and the liver. The cystic duct was too short to complete a cholangiogram. 3 clips were placed proximally on the cystic duct. One was placed distally and it was divided. Further dissection revealed a small anterior and posterior branch of the cystic artery. These were clipped proximally and divided distally with cautery. Gallbladder was taken off the liver bed using cautery. Gallbladder was placed in a bag and removed from the abdomen. It was sent to pathology. Liver bed was cauterized to get good hemostasis. A piece of Surgicel snow was also placed. The liver bed was dry. The abdomen was irrigated and irrigation fluid returned clear. Clips remain in good position. Liver bed was dry. Ports were removed under direct vision. Pneumoperitoneum was released. Supraumbilical fascia was closed by tying the pursestring. All 4 wounds were irrigated and the skin of each was closed with running 4-0 Vicryl subcuticular followed by liquid.. All counts were correct. She tolerated the procedure well without apparent complication & was taken recovery in stable condition.  PATIENT DISPOSITION:  PACU - hemodynamically stable.   Delay start of Pharmacological VTE agent (>24hrs) due to surgical blood loss or risk of bleeding:  no  Georganna Skeans, MD, MPH, FACS Pager: [phone]  9/19/20171:09 PM

## 2016-04-30 NOTE — Care Management Obs Status (Signed)
[facility] NOTIFICATION   Patient Details  Name: Crystal Kidd MRN: [id number] Date of Birth: Sep 04, 1945   Medicare Observation Status Notification Given:  Yes    Marilu Favre, RN 04/30/2016, 10:11 AM

## 2016-04-30 NOTE — ED Notes (Signed)
Attempted to call report at this time, no answer on unit.

## 2016-04-30 NOTE — Transfer of Care (Signed)
Immediate Anesthesia Transfer of Care Note  Patient: Crystal Kidd  Procedure(s) Performed: Procedure(s): LAPAROSCOPIC CHOLECYSTECTOMY (N/A)  Patient Location: PACU  Anesthesia Type:General  Level of Consciousness: awake, alert  and oriented  Airway & Oxygen Therapy: Patient Spontanous Breathing and Patient connected to nasal cannula oxygen  Post-op Assessment: Report given to RN, Post -op Vital signs reviewed and stable and Patient moving all extremities X 4  Post vital signs: Reviewed and stable  Last Vitals:  Vitals:   04/30/16 0603 04/30/16 1326  BP: (!) 130/59 (!) 142/67  Pulse: (!) 111 (!) 102  Resp: 16 (!) 32  Temp: 37.3 C 37.4 C    Last Pain:  Vitals:   04/30/16 0640  TempSrc:   PainSc: Asleep         Complications: No apparent anesthesia complications

## 2016-04-30 NOTE — Anesthesia Procedure Notes (Signed)
Procedure Name: Intubation Date/Time: 04/30/2016 12:12 PM Performed by: Rush Farmer E Pre-anesthesia Checklist: Patient identified, Emergency Drugs available, Suction available and Patient being monitored Patient Re-evaluated:Patient Re-evaluated prior to inductionOxygen Delivery Method: Circle system utilized Preoxygenation: Pre-oxygenation with 100% oxygen Intubation Type: IV induction Ventilation: Mask ventilation without difficulty Laryngoscope Size: Mac and 3 Grade View: Grade III Tube type: Oral Tube size: 7.0 mm Number of attempts: 1 Airway Equipment and Method: Bougie stylet Placement Confirmation: positive ETCO2 and breath sounds checked- equal and bilateral Secured at: 21 cm Tube secured with: Tape Dental Injury: Teeth and Oropharynx as per pre-operative assessment

## 2016-04-30 NOTE — Anesthesia Postprocedure Evaluation (Signed)
Anesthesia Post Note  Patient: Crystal Kidd  Procedure(s) Performed: Procedure(s) (LRB): LAPAROSCOPIC CHOLECYSTECTOMY (N/A)  Patient location during evaluation: PACU Anesthesia Type: General Level of consciousness: awake and alert Pain management: pain level controlled Vital Signs Assessment: post-procedure vital signs reviewed and stable Respiratory status: spontaneous breathing, nonlabored ventilation, respiratory function stable and patient connected to nasal cannula oxygen Cardiovascular status: blood pressure returned to baseline and stable Postop Assessment: no signs of nausea or vomiting Anesthetic complications: no    Last Vitals:  Vitals:   04/30/16 1419 04/30/16 1428  BP:  120/71  Pulse: (!) 111 (!) 102  Resp: (!) 23 (!) 21  Temp:  36.5 C    Last Pain:  Vitals:   04/30/16 1758  TempSrc:   PainSc: 10-Worst pain ever                 Taevon Aschoff,W. EDMOND

## 2016-04-30 NOTE — ED Notes (Addendum)
Marden Noble, pt's son contact info, [phone]

## 2016-04-30 NOTE — Anesthesia Preprocedure Evaluation (Addendum)
Anesthesia Evaluation  Patient identified by MRN, date of birth, ID band Patient awake    Reviewed: Allergy & Precautions, NPO status , Patient's Chart, lab work & pertinent test results  History of Anesthesia Complications Negative for: history of anesthetic complications  Airway Mallampati: II  TM Distance: >3 FB Neck ROM: Full    Dental  (+) Partial Upper, Dental Advisory Given, Missing   Pulmonary neg pulmonary ROS,    breath sounds clear to auscultation       Cardiovascular hypertension, Pt. on medications (-) angina Rhythm:Regular Rate:Normal     Neuro/Psych  Headaches,    GI/Hepatic Neg liver ROS, GERD  Medicated,N/v with acute chole   Endo/Other  Hypothyroidism   Renal/GU negative Renal ROS     Musculoskeletal  (+) Arthritis , Osteoarthritis,    Abdominal   Peds  Hematology negative hematology ROS (+)   Anesthesia Other Findings   Reproductive/Obstetrics                            Anesthesia Physical Anesthesia Plan  ASA: II  Anesthesia Plan: General   Post-op Pain Management:    Induction: Intravenous  Airway Management Planned: Oral ETT  Additional Equipment:   Intra-op Plan:   Post-operative Plan: Extubation in OR  Informed Consent: I have reviewed the patients History and Physical, chart, labs and discussed the procedure including the risks, benefits and alternatives for the proposed anesthesia with the patient or authorized representative who has indicated his/her understanding and acceptance.   Dental advisory given  Plan Discussed with: CRNA, Anesthesiologist and Surgeon  Anesthesia Plan Comments: (Plan routine monitors, GETA)       Anesthesia Quick Evaluation

## 2016-05-01 ENCOUNTER — Encounter (HOSPITAL_COMMUNITY): Payer: Self-pay | Admitting: General Surgery

## 2016-05-01 ENCOUNTER — Observation Stay (HOSPITAL_COMMUNITY): Payer: Commercial Managed Care - HMO

## 2016-05-01 DIAGNOSIS — R918 Other nonspecific abnormal finding of lung field: Secondary | ICD-10-CM | POA: Diagnosis not present

## 2016-05-01 MED ORDER — FLUTICASONE PROPIONATE 50 MCG/ACT NA SUSP
2.0000 | Freq: Every day | NASAL | Status: DC
  Administered 2016-05-01 – 2016-05-02 (×2): 2 via NASAL
  Filled 2016-05-01: qty 16

## 2016-05-01 MED ORDER — OXYCODONE HCL 5 MG PO TABS: 5.0000 mg | ORAL_TABLET | Freq: Four times a day (QID) | ORAL | 0 refills | Status: DC | PRN

## 2016-05-01 MED ORDER — PANTOPRAZOLE SODIUM 40 MG PO TBEC
40.0000 mg | DELAYED_RELEASE_TABLET | Freq: Every day | ORAL | Status: DC
  Administered 2016-05-01 – 2016-05-02 (×2): 40 mg via ORAL
  Filled 2016-05-01 (×2): qty 1

## 2016-05-01 MED ORDER — LOSARTAN POTASSIUM 50 MG PO TABS
50.0000 mg | ORAL_TABLET | Freq: Every day | ORAL | Status: DC
  Administered 2016-05-01 – 2016-05-02 (×2): 50 mg via ORAL
  Filled 2016-05-01 (×2): qty 1

## 2016-05-01 MED ORDER — HYDROCHLOROTHIAZIDE 25 MG PO TABS
25.0000 mg | ORAL_TABLET | Freq: Every day | ORAL | Status: DC
  Administered 2016-05-01 – 2016-05-02 (×2): 25 mg via ORAL
  Filled 2016-05-01 (×2): qty 1

## 2016-05-01 MED ORDER — GABAPENTIN 100 MG PO CAPS
100.0000 mg | ORAL_CAPSULE | Freq: Three times a day (TID) | ORAL | Status: DC
  Administered 2016-05-01 – 2016-05-02 (×4): 100 mg via ORAL
  Filled 2016-05-01 (×4): qty 1

## 2016-05-01 MED ORDER — ACETAMINOPHEN 500 MG PO TABS: 500.0000 mg | ORAL_TABLET | Freq: Four times a day (QID) | ORAL | Status: DC | PRN

## 2016-05-01 MED ORDER — ONDANSETRON 4 MG PO TBDP: 4.0000 mg | ORAL_TABLET | Freq: Four times a day (QID) | ORAL | 0 refills | Status: DC | PRN

## 2016-05-01 MED ORDER — LEVOTHYROXINE SODIUM 50 MCG PO TABS
50.0000 ug | ORAL_TABLET | Freq: Every day | ORAL | Status: DC
Start: 2016-05-01 — End: 2016-05-02
  Administered 2016-05-01 – 2016-05-02 (×2): 50 ug via ORAL
  Filled 2016-05-01 (×2): qty 1

## 2016-05-01 NOTE — Discharge Instructions (Signed)
Your appointment is at **, please arrive at least 30 min before your appointment to complete your check in paperwork.  If you are unable to arrive 30 min prior to your appointment time we may have to cancel or reschedule you.  LAPAROSCOPIC SURGERY: POST OP INSTRUCTIONS  1. DIET: Follow a light bland diet the first 24 hours after arrival home, such as soup, liquids, crackers, etc. Be sure to include lots of fluids daily. Avoid fast food or heavy meals as your are more likely to get nauseated. Eat a low fat the next few days after surgery.  2. Take your usually prescribed home medications unless otherwise directed. 3. PAIN CONTROL:  1. Pain is best controlled by a usual combination of three different methods TOGETHER:  1. Ice/Heat 2. Over the counter pain medication 3. Prescription pain medication 2. Most patients will experience some swelling and bruising around the incisions. Ice packs or heating pads (30-60 minutes up to 6 times a day) will help. Use ice for the first few days to help decrease swelling and bruising, then switch to heat to help relax tight/sore spots and speed recovery. Some people prefer to use ice alone, heat alone, alternating between ice & heat. Experiment to what works for you. Swelling and bruising can take several weeks to resolve.  3. It is helpful to take an over-the-counter pain medication regularly for the first few weeks. Choose one of the following that works best for you:  1. Naproxen (Aleve, etc) Two 220mg  tabs twice a day 2. Ibuprofen (Advil, etc) Three 200mg  tabs four times a day (every meal & bedtime) 3. Acetaminophen (Tylenol, etc) 500-650mg  four times a day (every meal & bedtime) 4. A prescription for pain medication (such as oxycodone, hydrocodone, etc) should be given to you upon discharge. Take your pain medication as prescribed.  1. If you are having problems/concerns with the prescription medicine (does not control pain, nausea, vomiting, rash, itching, etc),  please call us [phone] to see if we need to switch you to a different pain medicine that will work better for you and/or control your side effect better. 2. If you need a refill on your pain medication, please contact your pharmacy. They will contact our office to request authorization. Prescriptions will not be filled after 5 pm or on week-ends. 4. Avoid getting constipated. Between the surgery and the pain medications, it is common to experience some constipation. Increasing fluid intake and taking a fiber supplement (such as Metamucil, Citrucel, FiberCon, MiraLax, etc) 1-2 times a day regularly will usually help prevent this problem from occurring. A mild laxative (prune juice, Milk of Magnesia, MiraLax, etc) should be taken according to package directions if there are no bowel movements after 48 hours.  5. Watch out for diarrhea. If you have many loose bowel movements, simplify your diet to bland foods & liquids for a few days. Stop any stool softeners and decrease your fiber supplement. Switching to mild anti-diarrheal medications (Kayopectate, Pepto Bismol) can help. If this worsens or does not improve, please call us. 6. Wash / shower every day. You may shower over the dressings as they are waterproof. Continue to shower over incision(s) after the dressing is off. 7. Remove your waterproof bandages 5 days after surgery. You may leave the incision open to air. You may replace a dressing/Band-Aid to cover the incision for comfort if you wish.  8. ACTIVITIES as tolerated:  1. You may resume regular (light) daily activities beginning the next day--such as  daily self-care, walking, climbing stairs--gradually increasing activities as tolerated. If you can walk 30 minutes without difficulty, it is safe to try more intense activity such as jogging, treadmill, bicycling, low-impact aerobics, swimming, etc. 2. Save the most intensive and strenuous activity for last such as sit-ups, heavy lifting, contact  sports, etc Refrain from any heavy lifting or straining until you are off narcotics for pain control.  3. DO NOT PUSH THROUGH PAIN. Let pain be your guide: If it hurts to do something, don't do it. Pain is your body warning you to avoid that activity for another week until the pain goes down. 4. You may drive when you are no longer taking prescription pain medication, you can comfortably wear a seatbelt, and you can safely maneuver your car and apply brakes. 5. You may have sexual intercourse when it is comfortable.  9. FOLLOW UP in our office  1. Please call [facility] at [phone] to set up an appointment to see your surgeon in the office for a follow-up appointment approximately 2-3 weeks after your surgery. 2. Make sure that you call for this appointment the day you arrive home to insure a convenient appointment time.      10. IF YOU HAVE DISABILITY OR FAMILY LEAVE FORMS, BRING THEM TO THE               OFFICE FOR PROCESSING.   WHEN TO CALL us [phone]:  1. Poor pain control 2. Reactions / problems with new medications (rash/itching, nausea, etc)  3. Fever over 101.5 F (38.5 C) 4. Inability to urinate 5. Nausea and/or vomiting 6. Worsening swelling or bruising 7. Continued bleeding from incision. 8. Increased pain, redness, or drainage from the incision  The clinic staff is available to answer your questions during regular business hours (8:30am-5pm). Please dont hesitate to call and ask to speak to one of our nurses for clinical concerns.  If you have a medical emergency, go to the nearest emergency room or call 911.  A surgeon from [facility] is always on call at the [facility], [place], [place], [place], [place] [address] ?  MAIN: [phone] ? TOLL FREE: [phone] ?  FAX [phone]  [email or web address]

## 2016-05-01 NOTE — Progress Notes (Signed)
[facility] Progress Note  1 Day Post-Op  Subjective: POD#1 s/p lap chole. C/o mild SOB this AM, secondary to pain. Rates abdominal pain as a 9/10, but states it does improve with robaxin/percocet. Urinating. Belching. Denies flatus/BM. Has ambulated to bathroom but not in halls. Pulling 500 on IS.   Objective: Vital signs in last 24 hours: Temp:  [97.7 F (36.5 C)-99.3 F (37.4 C)] 98.2 F (36.8 C) (09/20 0604) Pulse Rate:  [85-111] 85 (09/20 0604) Resp:  [18-33] 18 (09/20 0604) BP: (120-142)/(56-89) 125/56 (09/20 0604) SpO2:  [88 %-97 %] 93 % (09/20 0604) Last BM Date: 04/27/16  Intake/Output from previous day: 09/19 0701 - 09/20 0700 In: 3778.3 [P.O.:600; I.V.:3128.3; IV Piggyback:50] Out: 25 [Blood:25] Intake/Output this shift: No intake/output data recorded.  PE: Gen:  Alert, NAD, pleasant Pulm:  CTA, no W/R/R Abd: Soft, appropriately tender, ND, +BS, incisions C/D/I Ext:  No erythema, edema, or tenderness   Lab Results:   Recent Labs  04/29/16 1429 04/30/16 0516  WBC 12.8* 20.0*  HGB 14.3 13.8  HCT 43.2 42.2  PLT 257 229   BMET  Recent Labs  04/29/16 1429 04/30/16 0516  NA 136 136  K 3.3* 3.8  CL 102 104  CO2 26 25  GLUCOSE 120* 143*  BUN 8 7  CREATININE 0.71 0.89  CALCIUM 9.2 8.6*   PT/INR No results for input(s): LABPROT, INR in the last 72 hours. CMP     Component Value Date/Time   NA 136 04/30/2016 0516   NA 140 06/13/2014 1102   K 3.8 04/30/2016 0516   CL 104 04/30/2016 0516   CO2 25 04/30/2016 0516   GLUCOSE 143 (H) 04/30/2016 0516   BUN 7 04/30/2016 0516   BUN 10 06/13/2014 1102   CREATININE 0.89 04/30/2016 0516   CALCIUM 8.6 (L) 04/30/2016 0516   PROT 5.8 (L) 04/30/2016 0516   PROT 6.5 06/13/2014 1102   ALBUMIN 3.0 (L) 04/30/2016 0516   ALBUMIN 4.2 06/13/2014 1102   AST 46 (H) 04/30/2016 0516   ALT 34 04/30/2016 0516   ALKPHOS 77 04/30/2016 0516   BILITOT 0.6 04/30/2016 0516   GFRNONAA >60 04/30/2016 0516   GFRAA >60 04/30/2016 0516   Lipase     Component Value Date/Time   LIPASE 24 04/29/2016 1429   Studies/Results: US Abdomen Complete  Result Date: 04/29/2016 CLINICAL DATA:  Right upper quadrant abdominal pain. EXAM: ABDOMEN ULTRASOUND COMPLETE COMPARISON:  Acute abdominal series radiographs 12/27/2015 FINDINGS: Gallbladder: An 8 mm non mobile non shadowing polyp is present along the gallbladder wall. The gallbladder wall is thickened at 3.6 mm. There is a sonographic Murphy sign. Sludge is present. Pericholecystic fluid is present. Common bile duct: Diameter: 5.9 mm, upper limits of normal Liver: No focal lesion identified. Within normal limits in parenchymal echogenicity. IVC: No abnormality visualized. Pancreas: Visualized portion unremarkable. Spleen: Size and appearance within normal limits. Right Kidney: Length: 11.6 cm. Kidney is of normal echogenicity. The parenchyma is somewhat thin. No focal mass or stone is present. There is no hydronephrosis. Left Kidney: Length: 11.1 cm, within normal limits. Kidney is of normal echogenicity. The parenchyma is somewhat thin. No focal mass or stone is present. There is no hydronephrosis. Abdominal aorta: No aneurysm visualized. Other findings: None. IMPRESSION: 1. Gallbladder wall thickening, sonographic Murphy's sign, and pericholecystic fluid is highly suggestive of acute cholecystitis 2. Nonmobile polyp within the gallbladder. 3. The common bile duct is at the upper limits of normal. 4. Both kidneys are somewhat  atrophic without focal lesion. Electronically Signed   By: San Morelle M.D.   On: 04/29/2016 16:43   Dg Chest Port 1 View  Result Date: 04/29/2016 CLINICAL DATA:  Chest pain, abdominal pain EXAM: PORTABLE CHEST 1 VIEW COMPARISON:  12/27/2015 FINDINGS: Cardiomediastinal silhouette is stable. Mild elevation of the right hemidiaphragm. No acute infiltrate or pleural effusion. No pulmonary edema. IMPRESSION: No active disease. Electronically  Signed   By: Lahoma Crocker M.D.   On: 04/29/2016 14:57    Anti-infectives: Anti-infectives    Start     Dose/Rate Route Frequency Ordered Stop   04/30/16 0330  cefTRIAXone (ROCEPHIN) 2 g in dextrose 5 % 50 mL IVPB     2 g 100 mL/hr over 30 Minutes Intravenous Daily 04/30/16 0318       Assessment/Plan Acute cholecystitis S/p laparoscopic cholecystectomy 04/30/16, Dr. Georganna Skeans - pain control: PO robaxin, oxycodone, tylenol  - incentive spirometry - ambulate   FEN: heart healthy diet ID: Ceftriaxone day #3 DVT Proph: Lovenox, SCD's  Plan: ambulate, INCENTIVE SPIROMETRY, and try to wean oxygen today Discharge this afternoon vs tomorrow AM.   ** went back to check on patient at 1:30 PM for possible discharge. Had one episode of nausea and emesis after breakfast, lots of belching. Unsuccessful attempt to wean O2 today with walking (sats 87%). Per son, sats were low on admission. She has some expiratory rhonchi over left middle-lower lobe. Will review imaging prior to admission and order a portable CXR. Abdominal soreness is improving. Probable discharge tomorrow.   LOS: 0 days    Jill Alexanders , [facility] 05/01/2016, 7:41 AM Pager: [phone] Consults: [phone] Mon-Fri 7:00 am-4:30 pm Sat-Sun 7:00 am-11:30 am

## 2016-05-02 MED ORDER — BACLOFEN 10 MG PO TABS: 5.0000 mg | ORAL_TABLET | Freq: Three times a day (TID) | ORAL | 0 refills | Status: DC | PRN

## 2016-05-02 NOTE — Progress Notes (Signed)
Pt.'s oxygen level while walking on room air: 89% Pt.'s oxygen level while sitting on room air: 94%

## 2016-05-02 NOTE — Progress Notes (Signed)
Patient discharged home with instructions given, family member was at bedside.

## 2016-05-02 NOTE — Progress Notes (Signed)
2 Days Post-Op  Subjective: Comfortable this morning Coughing up a lot of mucus Pain controlled    Objective: Vital signs in last 24 hours: Temp:  [98.2 F (36.8 C)-99.3 F (37.4 C)] 99.3 F (37.4 C) (09/21 0520) Pulse Rate:  [86-107] 107 (09/21 0520) Resp:  [18] 18 (09/21 0520) BP: (122-136)/(52-70) 129/69 (09/21 0520) SpO2:  [93 %-97 %] 97 % (09/21 0520) Last BM Date: 04/27/16  Intake/Output from previous day: 09/20 0701 - 09/21 0700 In: 600 [P.O.:600] Out: 1300 [Urine:1300] Intake/Output this shift: No intake/output data recorded.  Exam Awake and alert Lungs mostly clear Abdomen soft, non distended, minimally tender  Lab Results:   Recent Labs  04/29/16 1429 04/30/16 0516  WBC 12.8* 20.0*  HGB 14.3 13.8  HCT 43.2 42.2  PLT 257 229   BMET  Recent Labs  04/29/16 1429 04/30/16 0516  NA 136 136  K 3.3* 3.8  CL 102 104  CO2 26 25  GLUCOSE 120* 143*  BUN 8 7  CREATININE 0.71 0.89  CALCIUM 9.2 8.6*   PT/INR No results for input(s): LABPROT, INR in the last 72 hours. ABG No results for input(s): PHART, HCO3 in the last 72 hours.  Invalid input(s): PCO2, PO2  Studies/Results: Dg Chest Port 1 View  Result Date: 05/01/2016 CLINICAL DATA:  Low oxygen saturation EXAM: PORTABLE CHEST 1 VIEW COMPARISON:  Chest x-ray of 04/29/2016 FINDINGS: The lungs are poorly aerated with mild basilar volume loss. No definite pneumonia or effusion is seen. A small left effusion would be difficult to exclude. The heart is mildly enlarged, somewhat accentuated by the poor inspiration. No bony abnormality is seen. IMPRESSION: 1. Poor inspiration with mild volume loss at the lung bases. 2. Cannot exclude a small left pleural effusion. Electronically Signed   By: Ivar Drape M.D.   On: 05/01/2016 14:34    Anti-infectives: Anti-infectives    Start     Dose/Rate Route Frequency Ordered Stop   04/30/16 0330  cefTRIAXone (ROCEPHIN) 2 g in dextrose 5 % 50 mL IVPB     2 g 100  mL/hr over 30 Minutes Intravenous Daily 04/30/16 0318        Assessment/Plan: s/p Procedure(s): LAPAROSCOPIC CHOLECYSTECTOMY (N/A)  Ween O2 Ambulate Work on IS  LOS: 0 days    Idell Hissong A 05/02/2016

## 2016-05-02 NOTE — Discharge Summary (Signed)
[facility] Discharge Summary   Patient ID: Crystal Kidd MRN: [id number] DOB/AGE: [birth year] 70 y.o.  Admit date: 04/29/2016 Discharge date: 05/02/2016  Admitting Diagnosis: Acute cholecystitis  Discharge Diagnosis Patient Active Problem List   Diagnosis Date Noted  . Acute cholecystitis 04/29/2016  . Ileus ([place]) 01/02/2016  . UTI (urinary tract infection) 01/02/2016  . Thoracic aortic atherosclerosis ([place]) 01/02/2016  . Swelling of right upper extremity 12/02/2015  . Trapezius muscle spasm 04/13/2015  . Stye 04/13/2015  . Medicare annual wellness visit, subsequent 06/14/2014  . Pain in joint, shoulder region 12/06/2013  . S/P hysterectomy with oophorectomy 12/06/2013  . Cataract 12/06/2013  . Transfusion history   . Abnormal CT scan of lung 08/24/2012  . New onset of headaches after age 38 08/22/2011  . Cervical disc disorder with radiculopathy of cervical region 05/21/2010  . OTHER DYSPHAGIA 05/31/2009  . VITAMIN D DEFICIENCY 05/16/2009  . Hypothyroidism 12/26/2007  . Hyperlipidemia LDL goal <100 12/26/2007  . Essential hypertension 12/26/2007  . GERD 12/26/2007  . Diverticulosis of large intestine 12/26/2007  . VITILIGO 12/31/2006  . OSTEOPENIA 12/31/2006    Consultants None  Imaging: RUQ U/S 04/29/16- An 8 mm non mobile non shadowing polyp is present along the gallbladder wall. The gallbladder wall is thickened at 3.6 mm. There is a sonographic Murphy sign. Sludge is present. Pericholecystic fluid is present.  Dg Chest Port 1 View  Result Date: 05/01/2016 CLINICAL DATA:  Low oxygen saturation EXAM: PORTABLE CHEST 1 VIEW COMPARISON:  Chest x-ray of 04/29/2016 FINDINGS: The lungs are poorly aerated with mild basilar volume loss. No definite pneumonia or effusion is seen. A small left effusion would be difficult to exclude. The heart is mildly enlarged, somewhat accentuated by the poor inspiration. No bony abnormality is seen. IMPRESSION: 1. Poor  inspiration with mild volume loss at the lung bases. 2. Cannot exclude a small left pleural effusion. Electronically Signed   By: Ivar Drape M.D.   On: 05/01/2016 14:34    Procedures Dr. Georganna Skeans (04/30/16) - Laparoscopic Cholecystectomy   Hospital Course:  70 year-old female who presented to [facility] with epigastric abdominal pain associated with nausea and vomiting. RUQ ultrasound significant for cholecystitis.  Patient was admitted and underwent procedure listed above.  Tolerated procedure well and was transferred to the floor.  Diet was advanced as tolerated.   On POD#2, the patient was voiding well, tolerating diet, ambulating well, pain well controlled, vital signs stable, incisions c/d/i and felt stable for discharge home.  During her admission the patient did have low oxygen saturations (upper 80's) requiring temporary use of oxygen, however this did improve and on POD#2 she no longer required supplemental oxygen. At discharge oxygen saturations were 94% at rest but the patient still had some sats in the 80's with ambulation. She was asymptomatic - no shortness of breath, tachypnea, or labored breathing. CXR negative for pneumonia or significant effusion. She agreed to follow up with her PCP to re-check her oxygen saturations and receive a pulmonary exam/workup if necessary. Patient will follow up in our office in 2 weeks and knows to call with questions or concerns.  She will call to schedule appointment date/time.  I have re-written her prescription for baclofen for pain PRN, as she had more improvement in pain with her muscle relaxer and did not like taking percocet.    Medication List    TAKE these medications   acetaminophen 500 MG tablet Commonly known as:  TYLENOL Take 500 mg by  mouth every 6 (six) hours as needed for headache (pain).   AZO TABS PO Take 2 tablets by mouth daily as needed (urinary pain).   baclofen 10 MG tablet Commonly known as:  LIORESAL Take 0.5 tablets (5  mg total) by mouth 3 (three) times daily as needed for muscle spasms.   cholecalciferol 1000 units tablet Commonly known as:  VITAMIN D Take 1,000 Units by mouth daily.   diphenhydrAMINE 25 MG tablet Commonly known as:  BENADRYL Take 25 mg by mouth at bedtime as needed for allergies.   FIBER SELECT GUMMIES PO Take 1 tablet by mouth daily as needed (constipation).   fluticasone 50 MCG/ACT nasal spray Commonly known as:  FLONASE USE 2 SPRAYS IN EACH NOSTRIL EVERY DAY What changed:  See the new instructions.   gabapentin 100 MG capsule Commonly known as:  NEURONTIN TAKE 1 CAPSULE (100 MG TOTAL) BY MOUTH 3 (THREE) TIMES DAILY. EVERY 8 HOURS AS NEEDED What changed:  See the new instructions.   GAS-X PO Take 3 strips by mouth daily as needed (indigestion).   hydrochlorothiazide 25 MG tablet Commonly known as:  HYDRODIURIL TAKE 1/2 TABLET DAILY AS NEEDED What changed:  See the new instructions.   levothyroxine 50 MCG tablet Commonly known as:  SYNTHROID, LEVOTHROID TAKE 1 TABLET DAILY EXCEPT TAKE 1 AND 1/2 TABLETS (75MCG) MONDAY, WEDNESDAY AND FRIDAY AS DIRECTED What changed:  how much to take  how to take this  when to take this  additional instructions   loratadine 10 MG tablet Commonly known as:  CLARITIN Take 10 mg by mouth daily.   losartan 50 MG tablet Commonly known as:  COZAAR TAKE 1 AND 1/2 TABLETS EVERY DAY What changed:  how much to take  how to take this  when to take this  additional instructions   omeprazole 40 MG capsule Commonly known as:  PRILOSEC TAKE 1 CAPSULE EVERY DAY   ondansetron 4 MG disintegrating tablet Commonly known as:  ZOFRAN-ODT Take 1 tablet (4 mg total) by mouth every 6 (six) hours as needed for nausea.   oxyCODONE 5 MG immediate release tablet Commonly known as:  Oxy IR/ROXICODONE Take 1-2 tablets (5-10 mg total) by mouth every 6 (six) hours as needed for moderate pain.   promethazine 12.5 MG tablet Commonly known  as:  PHENERGAN Take 1 tablet (12.5 mg total) by mouth every 8 (eight) hours as needed for nausea or vomiting.   simvastatin 20 MG tablet Commonly known as:  ZOCOR TAKE 1 TABLET EVERY EVENING   SYSTANE OP Place 1 drop into both eyes 4 (four) times daily as needed (dry eyes).   TUMS ULTRA 1000 PO Take 1,000 mg by mouth See admin instructions. Take 1 tablet (1000 mg) by mouth daily, may take an additional tablet as needed for indigestion        Follow-up Information    THOMPSON,BURKE E, MD. Schedule an appointment as soon as possible for a visit in 2 week(s).   Specialty:  General Surgery Why:  for post operative follow up Contact information: [address] [place] [address] [phone]        [name], MD. Schedule an appointment as soon as possible for a visit in 1 week(s).   Specialty:  Internal Medicine Why:  to follow up on your low oxygen saturation. Contact information: [place] [address] [phone]          Signed: Obie Dredge, [facility]  [facility] 05/02/2016, 1:51 PM Pager: [phone] Consults: [phone] Mon-Fri 7:00 am-4:30 pm Sat-Sun 7:00 am-11:30 am

## 2016-05-03 ENCOUNTER — Telehealth: Payer: Self-pay | Admitting: Internal Medicine

## 2016-05-03 ENCOUNTER — Telehealth: Payer: Self-pay

## 2016-05-03 NOTE — Telephone Encounter (Signed)
Ok. Thank you.

## 2016-05-03 NOTE — Telephone Encounter (Signed)
Appointment to be scheduled 05/07/16 at 4:30.  Patient aware.  TCM complete.

## 2016-05-03 NOTE — Telephone Encounter (Signed)
Pt called stating that she just took her temperature and it was 99.7. She was asking if she should take anything to help bring it down.  Call pt @ [phone]

## 2016-05-03 NOTE — Telephone Encounter (Signed)
HFU, Pt was discharged from hospital on 05/02/16. Dx was Inflamed gallbaldder. No appt avail to sch. Let me know where to sch pt. Thank you!  Call pt @ [phone].

## 2016-05-03 NOTE — Telephone Encounter (Signed)
Will follow and schedule as appropriate.  Note sent to PCP.

## 2016-05-03 NOTE — Telephone Encounter (Signed)
Spoken to patient and instructed her to take her Tylenol as prescibed for temperature and pain.

## 2016-05-03 NOTE — Telephone Encounter (Signed)
Transition Care Management Follow-up Telephone Call   Date discharged? 05/02/16   How have you been since you were released from the hospital? [place].  EATING/DRINKING WITHOUT ISSUES.  BREATHING TREATMENTS EVERY 2 HOURS. NO SOB. NO SUPPLEMENTAL OXYGEN AT HOME.  EXPELLING LARGE AMOUNTS OF WHITE MUCUS.  STAYING ACTIVE. FLATULENCE. NO BM.  SLEEPING WELL WITH HEAD PROPPED.  NO FEVER.    Instructed patient to go to the ED if she spikes a fever over the weekend, per PCP.  Verbalized understanding.  Patient to comply.   Do you understand why you were in the hospital? YES, ACUTE CHOLECYSTITIS   Do you understand the discharge instructions? YES, BREATHING TREATMENTS EVERY 2 HOURS, ACTIVITY AS TOLERATED, FOLLOW UP WITH PCP WITHIN 1 WEEK TO CHECK OXYGEN SATURATION LEVELS, CONTINUE WITH PULMONARY EXAM/WORK UP IF NEEDED.  DO NOT DRIVE WHILE TAKING OXYCODONE PAIN MEDICATION.   Where were you discharged to? HOME.   Items Reviewed:  Medications reviewed: YES, TAKING ALL SCHEDULED MEDICATIONS, AS DIRECTED.    Allergies reviewed: YES, BESIVANCE, ALENDRONATE, DILAUDID, IBANDRONATE.  Dietary changes reviewed: YES, LITE DIET. INCREASE AS TOLERATED.   Referrals reviewed: YES, APPOINTMENT SCHEDULED WITH GENERAL SURGERY AND PCP FOR LOW OXYGEN SATURATION.    Functional Questionnaire:   Activities of Daily Living (ADLs):   She states they are independent in the following: Independent in all ADLs. States they require assistance with the following: Does not require assistance at this time.   Any transportation issues/concerns?: NO.   Any patient concerns? Low oxygen saturation levels.     Confirmed importance and date/time of follow-up visits scheduled YES, appointment scheduled 05/07/16 at 4:30.  Provider Appointment booked with Dr. Derrel Nip (PCP).  Confirmed with patient if condition begins to worsen call PCP or go to the ER.  Patient was given the office number  and encouraged to call back with question or concerns.  : YES, patient verbalized understanding.

## 2016-05-04 ENCOUNTER — Encounter (HOSPITAL_COMMUNITY): Payer: Self-pay

## 2016-05-04 ENCOUNTER — Emergency Department (HOSPITAL_COMMUNITY): Payer: Commercial Managed Care - HMO

## 2016-05-04 ENCOUNTER — Inpatient Hospital Stay (HOSPITAL_COMMUNITY)
Admission: EM | Admit: 2016-05-04 | Discharge: 2016-05-08 | DRG: 947 | Disposition: A | Discharge status: Home / Self Care | Payer: Commercial Managed Care - HMO | Attending: General Surgery | Admitting: General Surgery

## 2016-05-04 DIAGNOSIS — Z79899 Other long term (current) drug therapy: Secondary | ICD-10-CM | POA: Diagnosis not present

## 2016-05-04 DIAGNOSIS — J9811 Atelectasis: Secondary | ICD-10-CM | POA: Diagnosis present

## 2016-05-04 DIAGNOSIS — J810 Acute pulmonary edema: Secondary | ICD-10-CM | POA: Diagnosis not present

## 2016-05-04 DIAGNOSIS — E785 Hyperlipidemia, unspecified: Secondary | ICD-10-CM | POA: Diagnosis present

## 2016-05-04 DIAGNOSIS — K219 Gastro-esophageal reflux disease without esophagitis: Secondary | ICD-10-CM | POA: Diagnosis present

## 2016-05-04 DIAGNOSIS — J9601 Acute respiratory failure with hypoxia: Secondary | ICD-10-CM | POA: Diagnosis present

## 2016-05-04 DIAGNOSIS — E039 Hypothyroidism, unspecified: Secondary | ICD-10-CM | POA: Diagnosis not present

## 2016-05-04 DIAGNOSIS — G8918 Other acute postprocedural pain: Principal | ICD-10-CM | POA: Diagnosis present

## 2016-05-04 DIAGNOSIS — J90 Pleural effusion, not elsewhere classified: Secondary | ICD-10-CM | POA: Diagnosis not present

## 2016-05-04 DIAGNOSIS — Z4889 Encounter for other specified surgical aftercare: Secondary | ICD-10-CM | POA: Diagnosis not present

## 2016-05-04 DIAGNOSIS — K5900 Constipation, unspecified: Secondary | ICD-10-CM

## 2016-05-04 DIAGNOSIS — R110 Nausea: Secondary | ICD-10-CM | POA: Diagnosis not present

## 2016-05-04 DIAGNOSIS — M1990 Unspecified osteoarthritis, unspecified site: Secondary | ICD-10-CM | POA: Diagnosis present

## 2016-05-04 DIAGNOSIS — R509 Fever, unspecified: Secondary | ICD-10-CM | POA: Diagnosis not present

## 2016-05-04 DIAGNOSIS — I10 Essential (primary) hypertension: Secondary | ICD-10-CM | POA: Diagnosis present

## 2016-05-04 DIAGNOSIS — Z9049 Acquired absence of other specified parts of digestive tract: Secondary | ICD-10-CM | POA: Diagnosis not present

## 2016-05-04 DIAGNOSIS — Z9889 Other specified postprocedural states: Secondary | ICD-10-CM

## 2016-05-04 DIAGNOSIS — R1084 Generalized abdominal pain: Secondary | ICD-10-CM | POA: Diagnosis not present

## 2016-05-04 DIAGNOSIS — R109 Unspecified abdominal pain: Secondary | ICD-10-CM | POA: Diagnosis present

## 2016-05-04 DIAGNOSIS — R1011 Right upper quadrant pain: Secondary | ICD-10-CM | POA: Diagnosis not present

## 2016-05-04 DIAGNOSIS — R55 Syncope and collapse: Secondary | ICD-10-CM | POA: Diagnosis not present

## 2016-05-04 LAB — URINALYSIS, ROUTINE W REFLEX MICROSCOPIC
BILIRUBIN URINE: NEGATIVE
Glucose, UA: NEGATIVE mg/dL
HGB URINE DIPSTICK: NEGATIVE
KETONES UR: NEGATIVE mg/dL
Nitrite: NEGATIVE
PH: 8 (ref 5.0–8.0)
Protein, ur: NEGATIVE mg/dL
SPECIFIC GRAVITY, URINE: 1.01 (ref 1.005–1.030)

## 2016-05-04 LAB — CBC WITH DIFFERENTIAL/PLATELET
BASOS ABS: 0 10*3/uL (ref 0.0–0.1)
Basophils Relative: 0 %
EOS PCT: 2 %
Eosinophils Absolute: 0.2 10*3/uL (ref 0.0–0.7)
HCT: 36.2 % (ref 36.0–46.0)
HEMOGLOBIN: 11.7 g/dL — AB (ref 12.0–15.0)
LYMPHS ABS: 1.1 10*3/uL (ref 0.7–4.0)
Lymphocytes Relative: 14 %
MCH: 29 pg (ref 26.0–34.0)
MCHC: 32.3 g/dL (ref 30.0–36.0)
MCV: 89.6 fL (ref 78.0–100.0)
Monocytes Absolute: 0.8 10*3/uL (ref 0.1–1.0)
Monocytes Relative: 11 %
NEUTROS ABS: 5.7 10*3/uL (ref 1.7–7.7)
NEUTROS PCT: 73 %
PLATELETS: 293 10*3/uL (ref 150–400)
RBC: 4.04 MIL/uL (ref 3.87–5.11)
RDW: 13.7 % (ref 11.5–15.5)
WBC: 7.8 10*3/uL (ref 4.0–10.5)

## 2016-05-04 LAB — COMPREHENSIVE METABOLIC PANEL
ALK PHOS: 86 U/L (ref 38–126)
ALT: 27 U/L (ref 14–54)
AST: 23 U/L (ref 15–41)
Albumin: 2.6 g/dL — ABNORMAL LOW (ref 3.5–5.0)
Anion gap: 5 (ref 5–15)
CALCIUM: 8.7 mg/dL — AB (ref 8.9–10.3)
CHLORIDE: 106 mmol/L (ref 101–111)
CO2: 27 mmol/L (ref 22–32)
CREATININE: 0.63 mg/dL (ref 0.44–1.00)
GFR calc Af Amer: >60 mL/min (ref >60)
Glucose, Bld: 101 mg/dL — ABNORMAL HIGH (ref 65–99)
Potassium: 3.3 mmol/L — ABNORMAL LOW (ref 3.5–5.1)
Sodium: 138 mmol/L (ref 135–145)
Total Bilirubin: 1.1 mg/dL (ref 0.3–1.2)
Total Protein: 6.2 g/dL — ABNORMAL LOW (ref 6.5–8.1)

## 2016-05-04 LAB — LIPASE, BLOOD: Lipase: 55 U/L — ABNORMAL HIGH (ref 11–51)

## 2016-05-04 LAB — URINE MICROSCOPIC-ADD ON

## 2016-05-04 MED ORDER — IOPAMIDOL (ISOVUE-300) INJECTION 61%
INTRAVENOUS | Status: AC
  Filled 2016-05-04: qty 30

## 2016-05-04 MED ORDER — ONDANSETRON HCL 4 MG/2ML IJ SOLN
4.0000 mg | Freq: Once | INTRAMUSCULAR | Status: AC
  Administered 2016-05-04: 4 mg via INTRAVENOUS
  Filled 2016-05-04: qty 2

## 2016-05-04 MED ORDER — FENTANYL CITRATE (PF) 100 MCG/2ML IJ SOLN
50.0000 ug | Freq: Once | INTRAMUSCULAR | Status: AC
  Administered 2016-05-04: 50 ug via INTRAVENOUS
  Filled 2016-05-04: qty 2

## 2016-05-04 MED ORDER — IOPAMIDOL (ISOVUE-370) INJECTION 76%
INTRAVENOUS | Status: AC
  Administered 2016-05-05: 100 mL
  Filled 2016-05-04: qty 100

## 2016-05-04 MED ORDER — SODIUM CHLORIDE 0.9 % IV BOLUS (SEPSIS)
1000.0000 mL | Freq: Once | INTRAVENOUS | Status: AC
  Administered 2016-05-04: 1000 mL via INTRAVENOUS

## 2016-05-04 NOTE — ED Provider Notes (Signed)
[facility] DEPT Provider Note   CSN: [id number] Arrival date & time: 05/04/16  2003     History   Chief Complaint Chief Complaint  Patient presents with  . Nausea  . Fever    HPI Crystal Kidd is a 70 y.o. female.  HPI 70 year old female who presents with nausea and fever at home. She has a history of thyroid disease, hypertension, and status post laparoscopic cholecystectomy on 04/30/2016. States that she initially felt well upon discharge from the hospital on the 21st. Yesterday began to have some nausea and low-grade fever. Today with temperature of 100.3 Fahrenheit for which she taken Tylenol for. States that she has not had a bowel movement since her surgery, and is not on narcotic medications or stool softeners. Denies any abdominal pain but has noted some mild abdominal distention. Still passing gas. No vomiting, dysuria, urinary frequency or difficulty urinating. Also states that she has been having cough productive of thick sputum. No chest pain or difficulty breathing. Past Medical History:  Diagnosis Date  . Allergic rhinoconjunctivitis   . Allergy 1992  . Arthritis   . Cataract   . Diverticulosis   . GERD (gastroesophageal reflux disease)    PMH of esophageal stricture  . Hyperlipidemia   . Hypertension   . Osteopenia    BMD @[facility]   . Thyroid disease    hypothyroidism  . Transfusion history 1968   post partum  . Trapezius muscle spasm 04/13/2015  . Vitiligo     Patient Active Problem List   Diagnosis Date Noted  . Acute cholecystitis 04/29/2016  . Ileus ([place]) 01/02/2016  . UTI (urinary tract infection) 01/02/2016  . Thoracic aortic atherosclerosis ([place]) 01/02/2016  . Swelling of right upper extremity 12/02/2015  . Trapezius muscle spasm 04/13/2015  . Stye 04/13/2015  . Medicare annual wellness visit, subsequent 06/14/2014  . Pain in joint, shoulder region 12/06/2013  . S/P hysterectomy with oophorectomy 12/06/2013  . Cataract 12/06/2013  .  Transfusion history   . Abnormal CT scan of lung 08/24/2012  . New onset of headaches after age 55 08/22/2011  . Cervical disc disorder with radiculopathy of cervical region 05/21/2010  . OTHER DYSPHAGIA 05/31/2009  . VITAMIN D DEFICIENCY 05/16/2009  . Hypothyroidism 12/26/2007  . Hyperlipidemia LDL goal <100 12/26/2007  . Essential hypertension 12/26/2007  . GERD 12/26/2007  . Diverticulosis of large intestine 12/26/2007  . VITILIGO 12/31/2006  . OSTEOPENIA 12/31/2006    Past Surgical History:  Procedure Laterality Date  . ABDOMINAL HYSTERECTOMY     for dysfunctional menses; USO with TAH  . APPENDECTOMY    . BREAST SURGERY     biopsy X 2  . CHOLECYSTECTOMY N/A 04/30/2016   Procedure: LAPAROSCOPIC CHOLECYSTECTOMY;  Surgeon: Georganna Skeans, MD;  Location: [facility];  Service: General;  Laterality: N/A;  . COLONOSCOPY     X2; Tics; Dr Fuller Plan, GI  . ESOPHAGEAL DILATION     X2  . EYE SURGERY    . FRACTURE SURGERY  2003or 2004  . ROTATOR CUFF REPAIR     R shoulder  . TUBAL LIGATION     befor hysterectomy  . UPPER GASTROINTESTINAL ENDOSCOPY      OB History    No data available       Home Medications    Prior to Admission medications   Medication Sig Start Date End Date Taking? Authorizing Provider  acetaminophen (TYLENOL) 500 MG tablet Take 500 mg by mouth every 6 (six) hours as needed for headache (pain).  Yes Historical Provider, MD  baclofen (LIORESAL) 10 MG tablet Take 0.5 tablets (5 mg total) by mouth 3 (three) times daily as needed for muscle spasms. 05/02/16  Yes Darci Current Simaan, PA-C  Calcium Carbonate Antacid (TUMS ULTRA 1000 PO) Take 1,000 mg by mouth See admin instructions. Take 1 tablet (1000 mg) by mouth daily, may take an additional tablet as needed for indigestion   Yes Historical Provider, MD  cholecalciferol (VITAMIN D) 1000 units tablet Take 1,000 Units by mouth daily.   Yes Historical Provider, MD  diphenhydrAMINE (BENADRYL) 25 MG tablet Take 25 mg by  mouth at bedtime as needed for allergies.   Yes Historical Provider, MD  FIBER SELECT GUMMIES PO Take 1 tablet by mouth daily as needed (constipation).    Yes Historical Provider, MD  fluticasone (FLONASE) 50 MCG/ACT nasal spray USE 2 SPRAYS IN EACH NOSTRIL EVERY DAY Patient taking differently: USE 2 SPRAYS IN EACH NOSTRIL DAILY AS NEEDED FOR CONGESTION 04/16/16  Yes [name], MD  gabapentin (NEURONTIN) 100 MG capsule TAKE 1 CAPSULE (100 MG TOTAL) BY MOUTH 3 (THREE) TIMES DAILY. EVERY 8 HOURS AS NEEDED Patient taking differently: TAKE 1 CAPSULE (100 MG TOTAL) BY MOUTH EVERY 8 HOURS AS NEEDED FOR NECK PAIN 06/17/15  Yes [name], MD  hydrochlorothiazide (HYDRODIURIL) 25 MG tablet TAKE 1/2 TABLET DAILY AS NEEDED Patient taking differently: TAKE 1/2 TABLET DAILY AS NEEDED FOR SWELLING OR SBP >150 01/15/16  Yes [name], MD  ibuprofen (ADVIL,MOTRIN) 200 MG tablet Take 200 mg by mouth every 6 (six) hours as needed for moderate pain.    Yes Historical Provider, MD  levothyroxine (SYNTHROID, LEVOTHROID) 50 MCG tablet TAKE 1 TABLET DAILY EXCEPT TAKE 1 AND 1/2 TABLETS (75MCG) MONDAY, WEDNESDAY AND FRIDAY AS DIRECTED Patient taking differently: Take 50-75 mcg by mouth See admin instructions. Take 1 tablet (50 mcg) by mouth daily before breakfast on Sunday, Tuesday, Thursday, Saturday, Take 1 1/2 tablet (75 mcg) before breakfast on Monday, Wednesday, Friday. 05/25/15  Yes [name], MD  loratadine (CLARITIN) 10 MG tablet Take 10 mg by mouth daily.   Yes Historical Provider, MD  losartan (COZAAR) 50 MG tablet TAKE 1 AND 1/2 TABLETS EVERY DAY Patient taking differently: Take 75 mg by mouth daily.  04/03/16  Yes [name], MD  omeprazole (PRILOSEC) 40 MG capsule TAKE 1 CAPSULE EVERY DAY 09/06/15  Yes [name], MD  Phenazopyridine HCl (AZO TABS PO) Take 2 tablets by mouth daily as needed (urinary pain).   Yes Historical Provider, MD  Polyethyl Glycol-Propyl Glycol (SYSTANE OP) Place 1  drop into both eyes 4 (four) times daily as needed (dry eyes).   Yes Historical Provider, MD  promethazine (PHENERGAN) 12.5 MG tablet Take 1 tablet (12.5 mg total) by mouth every 8 (eight) hours as needed for nausea or vomiting. 05/26/15  Yes [name], MD  Simethicone (GAS-X PO) Take 3 strips by mouth daily as needed (indigestion).   Yes Historical Provider, MD  simvastatin (ZOCOR) 20 MG tablet TAKE 1 TABLET EVERY EVENING 03/20/16  Yes [name], MD    Family History Family History  Problem Relation Age of Onset  . Breast cancer Sister     mammograms @ [facility]  . Colon polyps Sister   . COPD Father   . Cancer Mother     breast&stomach  . Stomach cancer Mother   . Breast cancer Mother   . Colon cancer Maternal Aunt   .  Cancer Maternal Aunt 80    colon cA  . Coronary artery disease Brother   . COPD Brother   . Colon polyps Brother   . Cancer Sister 32    BREAST  . COPD Son   . Diabetes Neg Hx   . Stroke Neg Hx   . Heart disease Neg Hx   . Esophageal cancer Neg Hx   . Rectal cancer Neg Hx     Social History Social History  Substance Use Topics  . Smoking status: Never Smoker  . Smokeless tobacco: Never Used  . Alcohol use Yes    1 - 6 Glasses of wine per week     Comment: have wine 1 glass every 6 month     Allergies   Besivance [besifloxacin hcl]; Alendronate sodium; Dilaudid [hydromorphone hcl]; and Ibandronate sodium   Review of Systems Review of Systems 10/14 systems reviewed and are negative other than those stated in the HPI   Physical Exam Updated Vital Signs BP 188/93   Pulse 85   Temp 98.5 F (36.9 C) (Oral)   Resp 16   Ht 5\' 3"  (1.6 m)   Wt 139 lb (63 kg)   SpO2 (!) 87%   BMI 24.62 kg/m   Physical Exam Physical Exam  Nursing note and vitals reviewed. Constitutional: Well developed, well nourished, non-toxic, and in no acute distress Head: Normocephalic and atraumatic.  Mouth/Throat: Oropharynx is clear and moist.  Neck: Normal  range of motion. Neck supple.  Cardiovascular: Normal rate and regular rhythm.  No edema. Pulmonary/Chest: Effort normal and breath sounds normal.  Abdominal: Soft. Mild distension. There is no tenderness. There is no rebound and no guarding.  postsurgical wounds are clean dry and intact.  Musculoskeletal: Normal range of motion.  Neurological: Alert, no facial droop, fluent speech, moves all extremities symmetrically Skin: Skin is warm and dry.  Psychiatric: Cooperative   ED Treatments / Results  Labs (all labs ordered are listed, but only abnormal results are displayed) Labs Reviewed  CBC WITH DIFFERENTIAL/PLATELET - Abnormal; Notable for the following:       Result Value   Hemoglobin 11.7 (*)    All other components within normal limits  COMPREHENSIVE METABOLIC PANEL - Abnormal; Notable for the following:    Potassium 3.3 (*)    Glucose, Bld 101 (*)    BUN <5 (*)    Calcium 8.7 (*)    Total Protein 6.2 (*)    Albumin 2.6 (*)    All other components within normal limits  URINALYSIS, ROUTINE W REFLEX MICROSCOPIC (NOT AT [facility]) - Abnormal; Notable for the following:    Leukocytes, UA TRACE (*)    All other components within normal limits  LIPASE, BLOOD - Abnormal; Notable for the following:    Lipase 55 (*)    All other components within normal limits  URINE MICROSCOPIC-ADD ON - Abnormal; Notable for the following:    Squamous Epithelial / LPF 0-5 (*)    Bacteria, UA RARE (*)    All other components within normal limits    EKG  EKG Interpretation None       Radiology Dg Chest 2 View  Result Date: 05/04/2016 CLINICAL DATA:  Nausea and fever. EXAM: CHEST  2 VIEW COMPARISON:  May 01, 2016 FINDINGS: No pneumothorax. The cardiomediastinal silhouette is stable. Suspected small bilateral effusions and bibasilar atelectasis. No convincing evidence of pneumonia. No other interval changes. IMPRESSION: Suspected small effusions and underlying atelectasis. No convincing  evidence of pneumonia.  Electronically Signed   By: Dorise Bullion III M.D   On: 05/04/2016 21:03   Dg Abd 2 Views  Result Date: 05/04/2016 CLINICAL DATA:  Fever with nausea. EXAM: ABDOMEN - 2 VIEW COMPARISON:  None. FINDINGS: Small effusions with bibasilar atelectasis suspected. No free air, portal venous gas, or pneumatosis. No evidence of bowel obstruction. IMPRESSION: No abnormality seen within the abdomen. Electronically Signed   By: Dorise Bullion III M.D   On: 05/04/2016 21:03    Procedures Procedures (including critical care time)  Medications Ordered in ED Medications  iopamidol (ISOVUE-300) 61 % injection (not administered)  iopamidol (ISOVUE-370) 76 % injection (not administered)  sodium chloride 0.9 % bolus 1,000 mL (0 mLs Intravenous Stopped 05/04/16 2214)  ondansetron (ZOFRAN) injection 4 mg (4 mg Intravenous Given 05/04/16 2106)  fentaNYL (SUBLIMAZE) injection 50 mcg (50 mcg Intravenous Given 05/04/16 2214)     Initial Impression / Assessment and Plan / ED Course  I have reviewed the triage vital signs and the nursing notes.  Pertinent labs & imaging results that were available during my care of the patient were reviewed by me and considered in my medical decision making (see chart for details).  Clinical Course    S/p lap choley w/ low grade fevers, nausea, constipation and productive cough. Afebrile in ED, taken tylenol PTA. Hemodynamically stable. Abdomen soft and benign, intermittent upper abdominal cramping. Post surgical wounds looks good. Work-up with normal UA, CXR with small effusions but no infiltrate. No leukocytosis. No evidence of retained gallstone as normal LFTs and t-bilirubin. Concern for PE vs occult pneumonia as with low pulse ox in ED. Family states that even after her surgery her pulse ox was 88-91%, and she was discharged with low O2 saturations. Discussed with Dr. Barry Dienes, covering for Dr. Grandville Silos. Will perform CTA PE of chest as well as CT  abdomen/pelvis given intermittent abdominal pain. This is pending at this time, signed out to Dr. Regenia Skeeter.   Final Clinical Impressions(s) / ED Diagnoses   Final diagnoses:  Constipation  Post-operative state    New Prescriptions New Prescriptions   No medications on file     Forde Dandy, MD 05/05/16 [phone]

## 2016-05-04 NOTE — ED Triage Notes (Signed)
Per [facility], pt from home with complaint of n/v and fever x 3 days. Pt had gallbladder surgery on Tuesday and was d/c home Thursday. Pt reports running a fever 100.3 at home and has been taking tylenol. Pt reports n/v since being d/c home and has not had a bowel movement. Pt alert and oriented x 4. Pt also reports productive cough with green mucuous that began before having surgery. VSS

## 2016-05-05 ENCOUNTER — Inpatient Hospital Stay (HOSPITAL_COMMUNITY): Payer: Commercial Managed Care - HMO

## 2016-05-05 ENCOUNTER — Emergency Department (HOSPITAL_COMMUNITY): Payer: Commercial Managed Care - HMO

## 2016-05-05 ENCOUNTER — Encounter (HOSPITAL_COMMUNITY): Payer: Self-pay | Admitting: Radiology

## 2016-05-05 DIAGNOSIS — K5900 Constipation, unspecified: Secondary | ICD-10-CM | POA: Diagnosis present

## 2016-05-05 DIAGNOSIS — J9601 Acute respiratory failure with hypoxia: Secondary | ICD-10-CM | POA: Diagnosis present

## 2016-05-05 DIAGNOSIS — E039 Hypothyroidism, unspecified: Secondary | ICD-10-CM | POA: Diagnosis present

## 2016-05-05 DIAGNOSIS — R109 Unspecified abdominal pain: Secondary | ICD-10-CM | POA: Diagnosis present

## 2016-05-05 DIAGNOSIS — I10 Essential (primary) hypertension: Secondary | ICD-10-CM | POA: Diagnosis present

## 2016-05-05 DIAGNOSIS — M1990 Unspecified osteoarthritis, unspecified site: Secondary | ICD-10-CM | POA: Diagnosis present

## 2016-05-05 DIAGNOSIS — Z79899 Other long term (current) drug therapy: Secondary | ICD-10-CM | POA: Diagnosis not present

## 2016-05-05 DIAGNOSIS — J9811 Atelectasis: Secondary | ICD-10-CM | POA: Diagnosis present

## 2016-05-05 DIAGNOSIS — G8918 Other acute postprocedural pain: Secondary | ICD-10-CM | POA: Diagnosis present

## 2016-05-05 DIAGNOSIS — J810 Acute pulmonary edema: Secondary | ICD-10-CM | POA: Diagnosis not present

## 2016-05-05 DIAGNOSIS — K219 Gastro-esophageal reflux disease without esophagitis: Secondary | ICD-10-CM | POA: Diagnosis present

## 2016-05-05 DIAGNOSIS — E785 Hyperlipidemia, unspecified: Secondary | ICD-10-CM | POA: Diagnosis present

## 2016-05-05 DIAGNOSIS — Z9049 Acquired absence of other specified parts of digestive tract: Secondary | ICD-10-CM | POA: Diagnosis not present

## 2016-05-05 DIAGNOSIS — J90 Pleural effusion, not elsewhere classified: Secondary | ICD-10-CM | POA: Diagnosis not present

## 2016-05-05 LAB — COMPREHENSIVE METABOLIC PANEL
ALBUMIN: 2.4 g/dL — AB (ref 3.5–5.0)
ALT: 23 U/L (ref 14–54)
AST: 22 U/L (ref 15–41)
Alkaline Phosphatase: 78 U/L (ref 38–126)
Anion gap: 7 (ref 5–15)
BILIRUBIN TOTAL: 0.8 mg/dL (ref 0.3–1.2)
CHLORIDE: 106 mmol/L (ref 101–111)
CO2: 26 mmol/L (ref 22–32)
Calcium: 8.2 mg/dL — ABNORMAL LOW (ref 8.9–10.3)
Creatinine, Ser: 0.6 mg/dL (ref 0.44–1.00)
GFR calc Af Amer: >60 mL/min (ref >60)
GFR calc non Af Amer: >60 mL/min (ref >60)
GLUCOSE: 86 mg/dL (ref 65–99)
POTASSIUM: 3.4 mmol/L — AB (ref 3.5–5.1)
Sodium: 139 mmol/L (ref 135–145)
Total Protein: 5.3 g/dL — ABNORMAL LOW (ref 6.5–8.1)

## 2016-05-05 LAB — CBC
HEMATOCRIT: 33.6 % — AB (ref 36.0–46.0)
Hemoglobin: 10.7 g/dL — ABNORMAL LOW (ref 12.0–15.0)
MCH: 28.5 pg (ref 26.0–34.0)
MCHC: 31.8 g/dL (ref 30.0–36.0)
MCV: 89.6 fL (ref 78.0–100.0)
PLATELETS: 270 10*3/uL (ref 150–400)
RBC: 3.75 MIL/uL — AB (ref 3.87–5.11)
RDW: 13.8 % (ref 11.5–15.5)
WBC: 7.2 10*3/uL (ref 4.0–10.5)

## 2016-05-05 IMAGING — CT CT ANGIO CHEST
1 series · 17 of 32 positions shown · IV contrast (Iodine)
Comparison: Abdominal ultrasound performed 04/29/2016, and CT of
the chest, abdomen and pelvis performed 05/09/2011

CLINICAL DATA: Status post laparoscopic cholecystectomy. Acute
onset of low grade fever and hypoxia. Generalized abdominal pain and
nausea. Initial encounter.

EXAM:
CT ANGIOGRAPHY CHEST
CT ABDOMEN AND PELVIS WITH CONTRAST
TECHNIQUE: Multidetector CT imaging of the chest was performed using the
standard protocol during bolus administration of intravenous
contrast. Multiplanar CT image reconstructions and MIPs were
obtained to evaluate the vascular anatomy. Multidetector CT imaging
of the abdomen and pelvis was performed using the standard protocol
during bolus administration of intravenous contrast.
CONTRAST:  100 mL of Isovue 370 IV contrast

[Series 601: kidney delay, idose (2) · axial · delayed · 0.78mm/px · z∈[+646,+796]mm · 17 of 34 slices shown]
[im 2/34  lung]
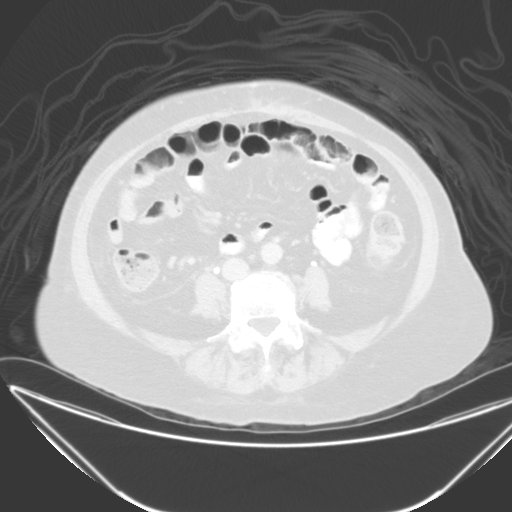
[im 4/34  soft-tissue]
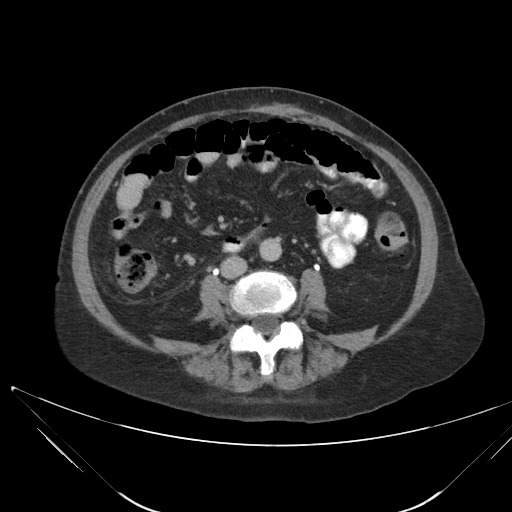
[im 6/34  lung]
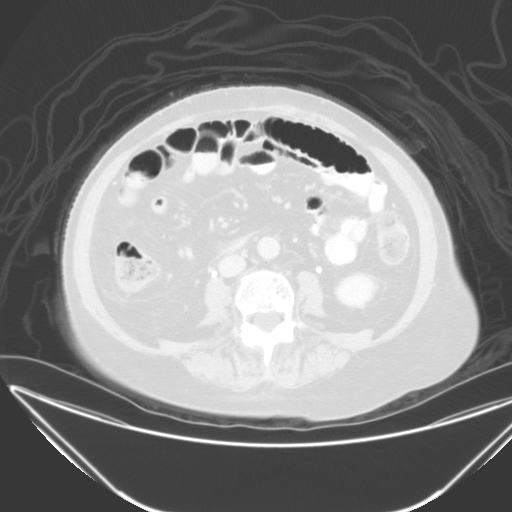
[im 8/34  soft-tissue]
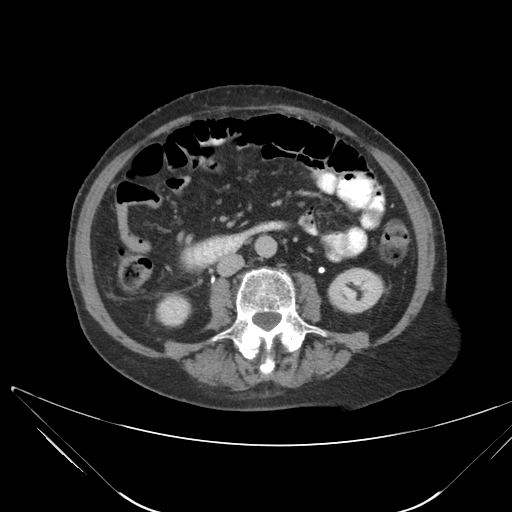
[im 9/34  lung]
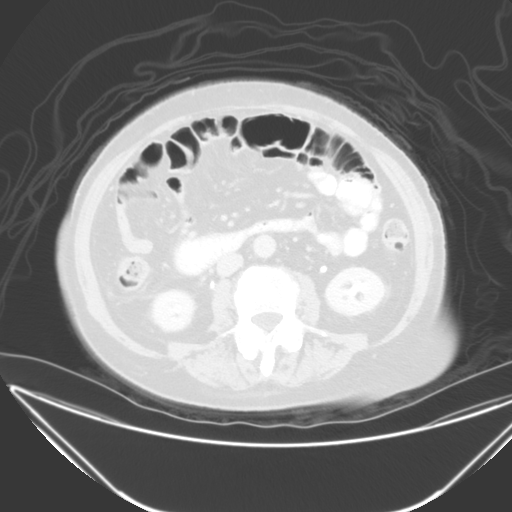
[im 11/34  soft-tissue]
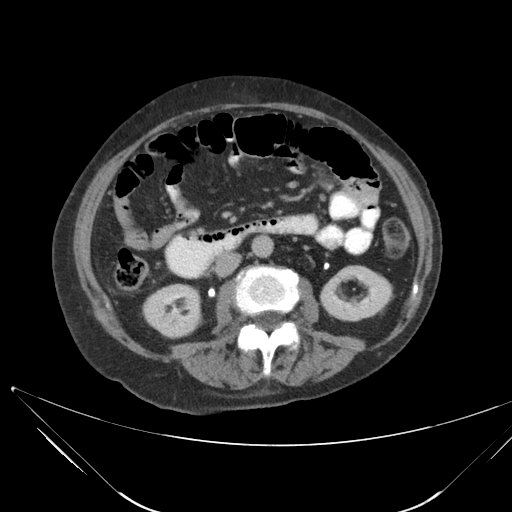
[im 13/34  lung]
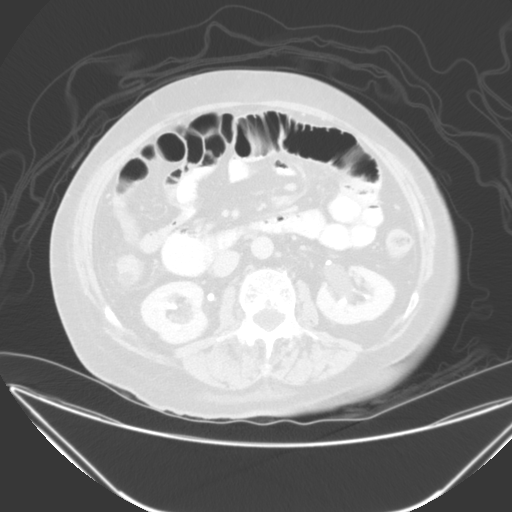
[im 15/34  soft-tissue]
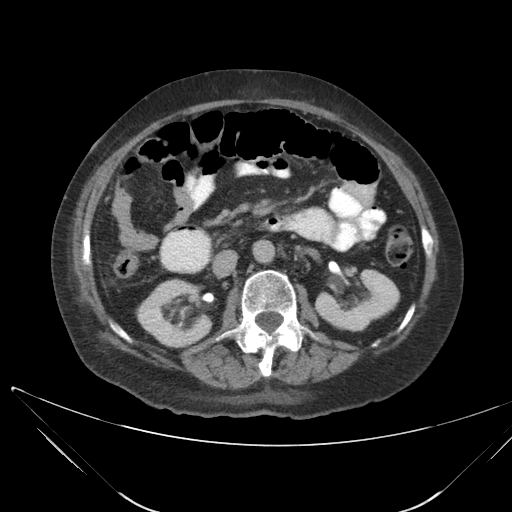
[im 18/34  lung]
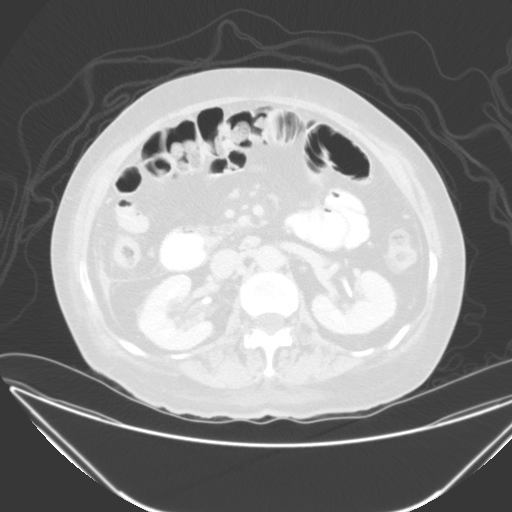
[im 19/34  soft-tissue]
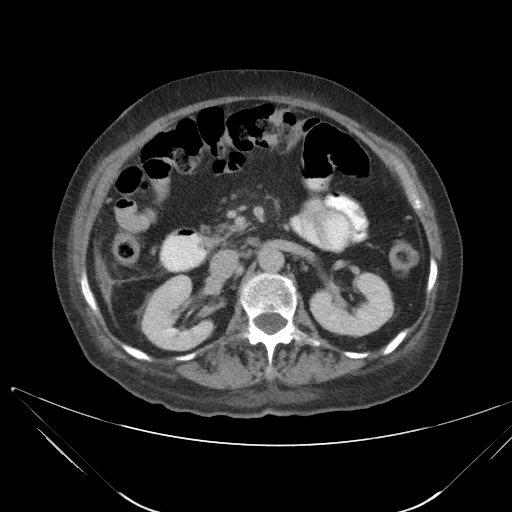
[im 21/34  lung]
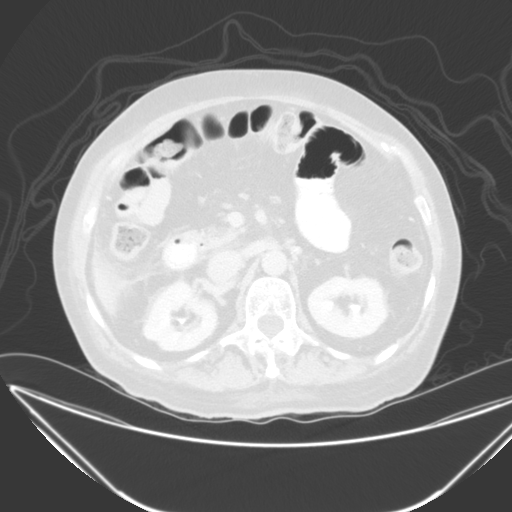
[im 23/34  soft-tissue]
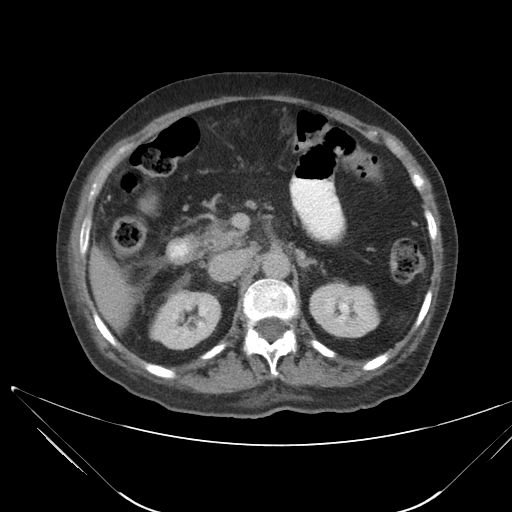
[im 25/34  lung]
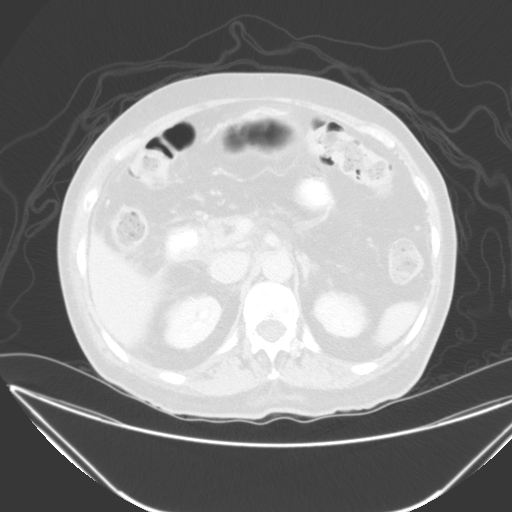
[im 26/34  soft-tissue]
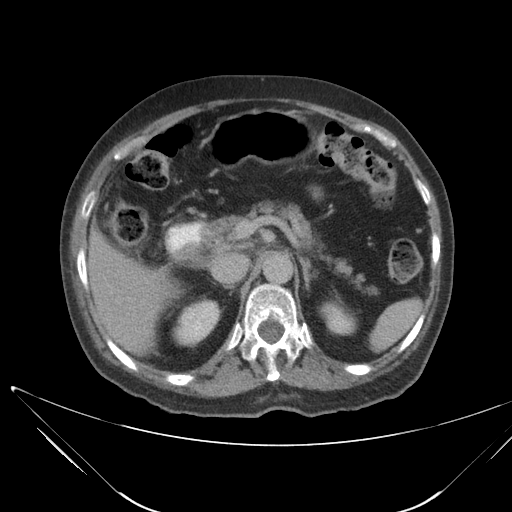
[im 28/34  lung]
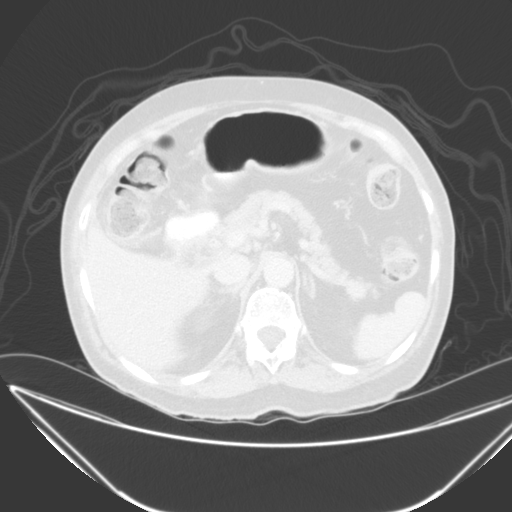
[im 30/34  soft-tissue]
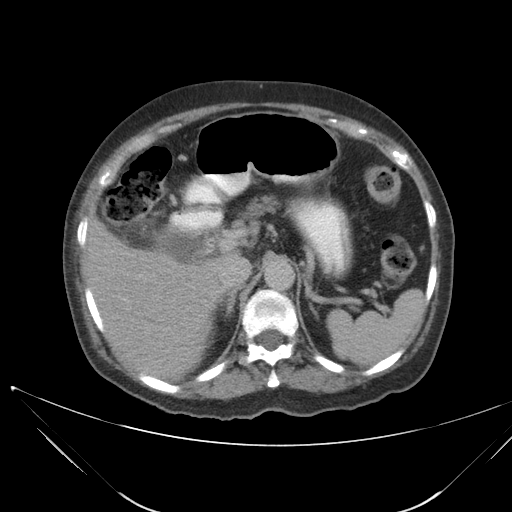
[im 32/34  lung]
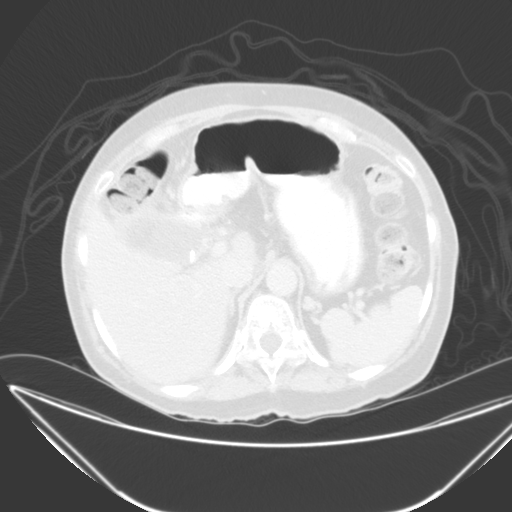

[17 of 32 positions shown; findings below may reference images not displayed]

FINDINGS: CTA CHEST FINDINGS

Cardiovascular: There is no evidence of significant pulmonary
embolus.

The heart is unremarkable in appearance. Mild calcification is noted
at the aortic arch. The great vessels are grossly unremarkable in
appearance.

Mediastinum/Nodes: No mediastinal lymphadenopathy is seen. No
pericardial effusion is identified. The visualized portions of the
thyroid gland are unremarkable. No axillary lymphadenopathy is seen.

Lungs/Pleura: Small bilateral pleural effusions are noted, right
greater than left. Underlying interstitial prominence likely
reflects mild pulmonary edema. No pneumothorax is seen. No masses
are identified.

Musculoskeletal: No acute osseous abnormalities are identified. The
visualized musculature is unremarkable in appearance.

Review of the MIP images confirms the above findings.

CT ABDOMEN and PELVIS FINDINGS

Hepatobiliary: A mildly complex collection of fluid is noted at the
gallbladder fossa, measuring 6.5 x 4.1 cm, with minimal air. Trace
fluid tracks along the duodenum and into the pelvis. A bile leak
cannot be excluded.

The liver is unremarkable. Scattered clips are noted at the
gallbladder fossa. The common bile duct remains normal in caliber.

Pancreas: The pancreas is within normal limits.

Spleen: The spleen is unremarkable in appearance.

Adrenals/Urinary Tract: The adrenal glands are unremarkable in
appearance. The kidneys are within normal limits. There is no
evidence of hydronephrosis. No renal or ureteral stones are
identified. Mild right-sided perinephric fluid is noted.

Stomach/Bowel: The stomach is unremarkable in appearance. The small
bowel is within normal limits. The patient is status post
appendectomy. The colon is unremarkable in appearance.

Vascular/Lymphatic: The abdominal aorta is unremarkable in
appearance. The inferior vena cava is grossly unremarkable. No
retroperitoneal lymphadenopathy is seen. No pelvic sidewall
lymphadenopathy is identified.

Reproductive: The patient is status post hysterectomy. No suspicious
adnexal masses are seen. The bladder is moderately distended. A
likely calcified epiploic appendage is noted within the fluid in the
pelvis.

Other: No additional soft tissue abnormalities are seen.

Musculoskeletal: No acute osseous abnormalities are identified. The
visualized musculature is unremarkable in appearance.

Review of the MIP images confirms the above findings.
IMPRESSION: 1. No evidence of significant pulmonary embolus.
2. Small bilateral pleural effusions, right greater than left.
Underlying interstitial prominence likely reflects mild pulmonary
edema.
3. Mildly complex collection of fluid at the gallbladder fossa,
measuring 6.5 x 4.1 cm, with minimal air. Trace fluid tracks along
the duodenum and into the pelvis. This is somewhat more than
expected status post cholecystectomy. A bile leak cannot be
excluded. Depending on the patient's symptoms, would consider HIDA
scan for further evaluation.

## 2016-05-05 MED ORDER — ACETAMINOPHEN 325 MG PO TABS: 650.0000 mg | ORAL_TABLET | Freq: Four times a day (QID) | ORAL | Status: DC | PRN

## 2016-05-05 MED ORDER — LEVOTHYROXINE SODIUM 75 MCG PO TABS
75.0000 ug | ORAL_TABLET | ORAL | Status: DC
  Administered 2016-05-06 – 2016-05-08 (×2): 75 ug via ORAL
  Filled 2016-05-05 (×3): qty 1

## 2016-05-05 MED ORDER — SENNA 8.6 MG PO TABS
1.0000 | ORAL_TABLET | Freq: Two times a day (BID) | ORAL | Status: DC
  Administered 2016-05-05 – 2016-05-08 (×6): 8.6 mg via ORAL
  Filled 2016-05-05 (×7): qty 1

## 2016-05-05 MED ORDER — TECHNETIUM TC 99M MEBROFENIN IV KIT
5.4500 | PACK | Freq: Once | INTRAVENOUS | Status: AC | PRN
  Administered 2016-05-05: 5 via INTRAVENOUS

## 2016-05-05 MED ORDER — POLYETHYLENE GLYCOL 3350 17 G PO PACK
17.0000 g | PACK | Freq: Two times a day (BID) | ORAL | Status: DC
  Administered 2016-05-05 – 2016-05-08 (×5): 17 g via ORAL
  Filled 2016-05-05 (×7): qty 1

## 2016-05-05 MED ORDER — ENOXAPARIN SODIUM 40 MG/0.4ML ~~LOC~~ SOLN
40.0000 mg | SUBCUTANEOUS | Status: DC
  Administered 2016-05-05 – 2016-05-07 (×3): 40 mg via SUBCUTANEOUS
  Filled 2016-05-05 (×3): qty 0.4

## 2016-05-05 MED ORDER — WHITE PETROLATUM GEL
Status: AC
  Administered 2016-05-05: 17:00:00
  Filled 2016-05-05: qty 1

## 2016-05-05 MED ORDER — FENTANYL CITRATE (PF) 100 MCG/2ML IJ SOLN
50.0000 ug | Freq: Once | INTRAMUSCULAR | Status: AC
  Administered 2016-05-05: 50 ug via INTRAVENOUS
  Filled 2016-05-05: qty 2

## 2016-05-05 MED ORDER — PANTOPRAZOLE SODIUM 40 MG PO TBEC
40.0000 mg | DELAYED_RELEASE_TABLET | Freq: Every day | ORAL | Status: DC
  Administered 2016-05-05 – 2016-05-08 (×4): 40 mg via ORAL
  Filled 2016-05-05 (×4): qty 1

## 2016-05-05 MED ORDER — FLUTICASONE PROPIONATE 50 MCG/ACT NA SUSP
2.0000 | Freq: Every day | NASAL | Status: DC
  Administered 2016-05-06 – 2016-05-08 (×3): 2 via NASAL
  Filled 2016-05-05: qty 16

## 2016-05-05 MED ORDER — BISACODYL 5 MG PO TBEC: 5.0000 mg | DELAYED_RELEASE_TABLET | Freq: Every day | ORAL | Status: DC | PRN

## 2016-05-05 MED ORDER — HYDROCHLOROTHIAZIDE 25 MG PO TABS
25.0000 mg | ORAL_TABLET | Freq: Every day | ORAL | Status: DC
  Administered 2016-05-06 – 2016-05-08 (×3): 25 mg via ORAL
  Filled 2016-05-05 (×4): qty 1

## 2016-05-05 MED ORDER — MORPHINE SULFATE (PF) 2 MG/ML IV SOLN
1.0000 mg | INTRAVENOUS | Status: DC | PRN
  Administered 2016-05-05: 3 mg via INTRAVENOUS
  Administered 2016-05-05: 2 mg via INTRAVENOUS
  Administered 2016-05-05: 1 mg via INTRAVENOUS
  Administered 2016-05-06: 3 mg via INTRAVENOUS
  Administered 2016-05-07 (×4): 2 mg via INTRAVENOUS
  Filled 2016-05-05: qty 2
  Filled 2016-05-05 (×2): qty 1
  Filled 2016-05-05: qty 2
  Filled 2016-05-05: qty 1
  Filled 2016-05-05: qty 2
  Filled 2016-05-05: qty 1

## 2016-05-05 MED ORDER — CALCIUM CARBONATE ANTACID 500 MG PO CHEW
1000.0000 mg | CHEWABLE_TABLET | Freq: Every day | ORAL | Status: DC
  Administered 2016-05-06 – 2016-05-08 (×3): 1000 mg via ORAL
  Filled 2016-05-05 (×4): qty 5

## 2016-05-05 MED ORDER — PHENAZOPYRIDINE HCL 200 MG PO TABS
200.0000 mg | ORAL_TABLET | Freq: Every day | ORAL | Status: DC | PRN
  Filled 2016-05-05: qty 1

## 2016-05-05 MED ORDER — ONDANSETRON HCL 4 MG/2ML IJ SOLN
4.0000 mg | Freq: Four times a day (QID) | INTRAMUSCULAR | Status: DC | PRN
  Administered 2016-05-05 – 2016-05-07 (×6): 4 mg via INTRAVENOUS
  Filled 2016-05-05 (×6): qty 2

## 2016-05-05 MED ORDER — LORATADINE 10 MG PO TABS
10.0000 mg | ORAL_TABLET | Freq: Every day | ORAL | Status: DC
  Administered 2016-05-05 – 2016-05-08 (×4): 10 mg via ORAL
  Filled 2016-05-05 (×4): qty 1

## 2016-05-05 MED ORDER — SIMETHICONE 80 MG PO CHEW: 160.0000 mg | CHEWABLE_TABLET | Freq: Four times a day (QID) | ORAL | Status: DC | PRN

## 2016-05-05 MED ORDER — LEVOTHYROXINE SODIUM 50 MCG PO TABS: 50.0000 ug | ORAL_TABLET | ORAL | Status: DC

## 2016-05-05 MED ORDER — GABAPENTIN 100 MG PO CAPS: 100.0000 mg | ORAL_CAPSULE | Freq: Three times a day (TID) | ORAL | Status: DC | PRN

## 2016-05-05 MED ORDER — LEVOTHYROXINE SODIUM 75 MCG PO TABS: 75.0000 ug | ORAL_TABLET | ORAL | Status: DC

## 2016-05-05 MED ORDER — KCL IN DEXTROSE-NACL 20-5-0.45 MEQ/L-%-% IV SOLN
INTRAVENOUS | Status: DC
  Administered 2016-05-05 – 2016-05-08 (×8): via INTRAVENOUS
  Filled 2016-05-05 (×9): qty 1000

## 2016-05-05 MED ORDER — LEVOTHYROXINE SODIUM 50 MCG PO TABS
50.0000 ug | ORAL_TABLET | ORAL | Status: DC
  Administered 2016-05-05 – 2016-05-07 (×2): 50 ug via ORAL
  Filled 2016-05-05: qty 1

## 2016-05-05 MED ORDER — SIMVASTATIN 20 MG PO TABS
20.0000 mg | ORAL_TABLET | Freq: Every evening | ORAL | Status: DC
  Administered 2016-05-05 – 2016-05-07 (×3): 20 mg via ORAL
  Filled 2016-05-05 (×3): qty 1

## 2016-05-05 MED ORDER — BACLOFEN 10 MG PO TABS
5.0000 mg | ORAL_TABLET | Freq: Three times a day (TID) | ORAL | Status: DC | PRN
  Administered 2016-05-05: 5 mg via ORAL
  Filled 2016-05-05: qty 1

## 2016-05-05 MED ORDER — IBUPROFEN 200 MG PO TABS
200.0000 mg | ORAL_TABLET | Freq: Four times a day (QID) | ORAL | Status: DC | PRN
  Filled 2016-05-05: qty 1

## 2016-05-05 MED ORDER — POLYVINYL ALCOHOL 1.4 % OP SOLN: Freq: Four times a day (QID) | OPHTHALMIC | Status: DC | PRN

## 2016-05-05 MED ORDER — DIPHENHYDRAMINE HCL 25 MG PO CAPS: 25.0000 mg | ORAL_CAPSULE | Freq: Four times a day (QID) | ORAL | Status: DC | PRN

## 2016-05-05 MED ORDER — LOSARTAN POTASSIUM 50 MG PO TABS
75.0000 mg | ORAL_TABLET | Freq: Every day | ORAL | Status: DC
  Administered 2016-05-05 – 2016-05-08 (×4): 75 mg via ORAL
  Filled 2016-05-05 (×4): qty 2

## 2016-05-05 NOTE — H&P (Signed)
Crystal Kidd is an 70 y.o. female.   Chief Complaint: abdominal pain HPI:  Pt is a 70 yo F who underwent lap chole 04/30/2016 for acute calculous cholecystitis.  Cholangiogram was not able to be performed due to the presence of a short cystic duct.  She was doing well until yesterday when she started to have upper abdominal pain.  This worsened over the day and night.  Today when it did not improve and she felt overall very poorly, she came to ED. She is passing gas, but has not had bowel movement for around 8 days.  She is nauseated, but has not thrown up.  She denies fever/chills/jaundice.  She is also complaining of back pain at this point.  She says she is bloated, but it is not changed since surgery.  She was also found to be hypoxic upon arrival to the ED.    Past Medical History:  Diagnosis Date  . Allergic rhinoconjunctivitis   . Allergy 1992  . Arthritis   . Cataract   . Diverticulosis   . GERD (gastroesophageal reflux disease)    PMH of esophageal stricture  . Hyperlipidemia   . Hypertension   . Osteopenia    BMD _0   . Thyroid disease    hypothyroidism  . Transfusion history 1968   post partum  . Trapezius muscle spasm 04/13/2015  . Vitiligo     Past Surgical History:  Procedure Laterality Date  . ABDOMINAL HYSTERECTOMY     for dysfunctional menses; USO with TAH  . APPENDECTOMY    . BREAST SURGERY     biopsy X 2  . CHOLECYSTECTOMY N/A 04/30/2016   Procedure: LAPAROSCOPIC CHOLECYSTECTOMY;  Surgeon: Georganna Skeans, MD;  Location: [facility];  Service: General;  Laterality: N/A;  . COLONOSCOPY     X2; Tics; Dr Fuller Plan, GI  . ESOPHAGEAL DILATION     X2  . EYE SURGERY    . FRACTURE SURGERY  2003or 2004  . ROTATOR CUFF REPAIR     R shoulder  . TUBAL LIGATION     befor hysterectomy  . UPPER GASTROINTESTINAL ENDOSCOPY      Family History  Problem Relation Age of Onset  . Breast cancer Sister     mammograms @ [facility]  . Colon polyps Sister   . COPD Father   . Cancer  Mother     breast&stomach  . Stomach cancer Mother   . Breast cancer Mother   . Colon cancer Maternal Aunt   . Cancer Maternal Aunt 80    colon cA  . Coronary artery disease Brother   . COPD Brother   . Colon polyps Brother   . Cancer Sister 23    BREAST  . COPD Son   . Diabetes Neg Hx   . Stroke Neg Hx   . Heart disease Neg Hx   . Esophageal cancer Neg Hx   . Rectal cancer Neg Hx    Social History:  reports that she has never smoked. She has never used smokeless tobacco. She reports that she drinks alcohol. She reports that she does not use drugs.  Allergies:  Allergies  Allergen Reactions  . Besivance [Besifloxacin Hcl] Shortness Of Breath and Other (See Comments)    Dizziness  . Alendronate Sodium Other (See Comments)    REACTION: intolerant ( PMH of esophageal stricture)  . Dilaudid [Hydromorphone Hcl] Itching    Nasal itching; resolution with Benadryl  . Ibandronate Sodium Other (See Comments)  bone pain    Meds Outpatient Medications   acetaminophen (TYLENOL) 500 MG tablet    baclofen (LIORESAL) 10 MG tablet    Calcium Carbonate Antacid (TUMS ULTRA 1000 PO)    cholecalciferol (VITAMIN D) 1000 units tablet    diphenhydrAMINE (BENADRYL) 25 MG tablet    FIBER SELECT GUMMIES PO    fluticasone (FLONASE) 50 MCG/ACT nasal spray    gabapentin (NEURONTIN) 100 MG capsule    hydrochlorothiazide (HYDRODIURIL) 25 MG tablet    ibuprofen (ADVIL,MOTRIN) 200 MG tablet    levothyroxine (SYNTHROID, LEVOTHROID) 50 MCG tablet    loratadine (CLARITIN) 10 MG tablet    losartan (COZAAR) 50 MG tablet    omeprazole (PRILOSEC) 40 MG capsule    Phenazopyridine HCl (AZO TABS PO)    Polyethyl Glycol-Propyl Glycol (SYSTANE OP)    promethazine (PHENERGAN) 12.5 MG tablet    Simethicone (GAS-X PO)    simvastatin (ZOCOR) 20 MG tablet      Results for orders placed or performed during the hospital encounter of 05/04/16 (from the past 48 hour(s))  Urinalysis, Routine w reflex  microscopic (not at [facility])     Status: Abnormal   Collection Time: 05/04/16  9:13 PM  Result Value Ref Range   Color, Urine YELLOW YELLOW   APPearance CLEAR CLEAR   Specific Gravity, Urine 1.010 1.005 - 1.030   pH 8.0 5.0 - 8.0   Glucose, UA NEGATIVE NEGATIVE mg/dL   Hgb urine dipstick NEGATIVE NEGATIVE   Bilirubin Urine NEGATIVE NEGATIVE   Ketones, ur NEGATIVE NEGATIVE mg/dL   Protein, ur NEGATIVE NEGATIVE mg/dL   Nitrite NEGATIVE NEGATIVE   Leukocytes, UA TRACE (A) NEGATIVE  Urine microscopic-add on     Status: Abnormal   Collection Time: 05/04/16  9:13 PM  Result Value Ref Range   Squamous Epithelial / LPF 0-5 (A) NONE SEEN   WBC, UA 0-5 0 - 5 WBC/hpf   RBC / HPF 0-5 0 - 5 RBC/hpf   Bacteria, UA RARE (A) NONE SEEN  CBC with Differential     Status: Abnormal   Collection Time: 05/04/16  9:15 PM  Result Value Ref Range   WBC 7.8 4.0 - 10.5 K/uL   RBC 4.04 3.87 - 5.11 MIL/uL   Hemoglobin 11.7 (L) 12.0 - 15.0 g/dL   HCT 36.2 36.0 - 46.0 %   MCV 89.6 78.0 - 100.0 fL   MCH 29.0 26.0 - 34.0 pg   MCHC 32.3 30.0 - 36.0 g/dL   RDW 13.7 11.5 - 15.5 %   Platelets 293 150 - 400 K/uL   Neutrophils Relative % 73 %   Neutro Abs 5.7 1.7 - 7.7 K/uL   Lymphocytes Relative 14 %   Lymphs Abs 1.1 0.7 - 4.0 K/uL   Monocytes Relative 11 %   Monocytes Absolute 0.8 0.1 - 1.0 K/uL   Eosinophils Relative 2 %   Eosinophils Absolute 0.2 0.0 - 0.7 K/uL   Basophils Relative 0 %   Basophils Absolute 0.0 0.0 - 0.1 K/uL  Comprehensive metabolic panel     Status: Abnormal   Collection Time: 05/04/16  9:15 PM  Result Value Ref Range   Sodium 138 135 - 145 mmol/L   Potassium 3.3 (L) 3.5 - 5.1 mmol/L   Chloride 106 101 - 111 mmol/L   CO2 27 22 - 32 mmol/L   Glucose, Bld 101 (H) 65 - 99 mg/dL   BUN <5 (L) 6 - 20 mg/dL   Creatinine, Ser 0.63 0.44 - 1.00  mg/dL   Calcium 8.7 (L) 8.9 - 10.3 mg/dL   Total Protein 6.2 (L) 6.5 - 8.1 g/dL   Albumin 2.6 (L) 3.5 - 5.0 g/dL   AST 23 15 - 41 U/L   ALT 27 14 -  54 U/L   Alkaline Phosphatase 86 38 - 126 U/L   Total Bilirubin 1.1 0.3 - 1.2 mg/dL   GFR calc non Af Amer >60 >60 mL/min   GFR calc Af Amer >60 >60 mL/min    Comment: (NOTE) The eGFR has been calculated using the CKD EPI equation. This calculation has not been validated in all clinical situations. eGFR's persistently <60 mL/min signify possible Chronic Kidney Disease.    Anion gap 5 5 - 15  Lipase, blood     Status: Abnormal   Collection Time: 05/04/16  9:15 PM  Result Value Ref Range   Lipase 55 (H) 11 - 51 U/L   Dg Chest 2 View  Result Date: 05/04/2016 CLINICAL DATA:  Nausea and fever. EXAM: CHEST  2 VIEW COMPARISON:  May 01, 2016 FINDINGS: No pneumothorax. The cardiomediastinal silhouette is stable. Suspected small bilateral effusions and bibasilar atelectasis. No convincing evidence of pneumonia. No other interval changes. IMPRESSION: Suspected small effusions and underlying atelectasis. No convincing evidence of pneumonia. Electronically Signed   By: Dorise Bullion III M.D   On: 05/04/2016 21:03   Ct Angio Chest Pe W And/or Wo Contrast  Result Date: 05/05/2016 CLINICAL DATA:  Status post laparoscopic cholecystectomy. Acute onset of low grade fever and hypoxia. Generalized abdominal pain and nausea. Initial encounter. EXAM: CT ANGIOGRAPHY CHEST CT ABDOMEN AND PELVIS WITH CONTRAST TECHNIQUE: Multidetector CT imaging of the chest was performed using the standard protocol during bolus administration of intravenous contrast. Multiplanar CT image reconstructions and MIPs were obtained to evaluate the vascular anatomy. Multidetector CT imaging of the abdomen and pelvis was performed using the standard protocol during bolus administration of intravenous contrast. CONTRAST:  100 mL of Isovue 370 IV contrast COMPARISON:  Abdominal ultrasound performed 04/29/2016, and CT of the chest, abdomen and pelvis performed 05/09/2011 FINDINGS: CTA CHEST FINDINGS Cardiovascular: There is no evidence of  significant pulmonary embolus. The heart is unremarkable in appearance. Mild calcification is noted at the aortic arch. The great vessels are grossly unremarkable in appearance. Mediastinum/Nodes: No mediastinal lymphadenopathy is seen. No pericardial effusion is identified. The visualized portions of the thyroid gland are unremarkable. No axillary lymphadenopathy is seen. Lungs/Pleura: Small bilateral pleural effusions are noted, right greater than left. Underlying interstitial prominence likely reflects mild pulmonary edema. No pneumothorax is seen. No masses are identified. Musculoskeletal: No acute osseous abnormalities are identified. The visualized musculature is unremarkable in appearance. Review of the MIP images confirms the above findings. CT ABDOMEN and PELVIS FINDINGS Hepatobiliary: A mildly complex collection of fluid is noted at the gallbladder fossa, measuring 6.5 x 4.1 cm, with minimal air. Trace fluid tracks along the duodenum and into the pelvis. A bile leak cannot be excluded. The liver is unremarkable. Scattered clips are noted at the gallbladder fossa. The common bile duct remains normal in caliber. Pancreas: The pancreas is within normal limits. Spleen: The spleen is unremarkable in appearance. Adrenals/Urinary Tract: The adrenal glands are unremarkable in appearance. The kidneys are within normal limits. There is no evidence of hydronephrosis. No renal or ureteral stones are identified. Mild right-sided perinephric fluid is noted. Stomach/Bowel: The stomach is unremarkable in appearance. The small bowel is within normal limits. The patient is status post appendectomy. The colon  is unremarkable in appearance. Vascular/Lymphatic: The abdominal aorta is unremarkable in appearance. The inferior vena cava is grossly unremarkable. No retroperitoneal lymphadenopathy is seen. No pelvic sidewall lymphadenopathy is identified. Reproductive: The patient is status post hysterectomy. No suspicious adnexal  masses are seen. The bladder is moderately distended. A likely calcified epiploic appendage is noted within the fluid in the pelvis. Other: No additional soft tissue abnormalities are seen. Musculoskeletal: No acute osseous abnormalities are identified. The visualized musculature is unremarkable in appearance. Review of the MIP images confirms the above findings. IMPRESSION: 1. No evidence of significant pulmonary embolus. 2. Small bilateral pleural effusions, right greater than left. Underlying interstitial prominence likely reflects mild pulmonary edema. 3. Mildly complex collection of fluid at the gallbladder fossa, measuring 6.5 x 4.1 cm, with minimal air. Trace fluid tracks along the duodenum and into the pelvis. This is somewhat more than expected status post cholecystectomy. A bile leak cannot be excluded. Depending on the patient's symptoms, would consider HIDA scan for further evaluation. Electronically Signed   By: Garald Balding M.D.   On: 05/05/2016 02:12   Ct Abdomen Pelvis W Contrast  Result Date: 05/05/2016 CLINICAL DATA:  Status post laparoscopic cholecystectomy. Acute onset of low grade fever and hypoxia. Generalized abdominal pain and nausea. Initial encounter. EXAM: CT ANGIOGRAPHY CHEST CT ABDOMEN AND PELVIS WITH CONTRAST TECHNIQUE: Multidetector CT imaging of the chest was performed using the standard protocol during bolus administration of intravenous contrast. Multiplanar CT image reconstructions and MIPs were obtained to evaluate the vascular anatomy. Multidetector CT imaging of the abdomen and pelvis was performed using the standard protocol during bolus administration of intravenous contrast. CONTRAST:  100 mL of Isovue 370 IV contrast COMPARISON:  Abdominal ultrasound performed 04/29/2016, and CT of the chest, abdomen and pelvis performed 05/09/2011 FINDINGS: CTA CHEST FINDINGS Cardiovascular: There is no evidence of significant pulmonary embolus. The heart is unremarkable in  appearance. Mild calcification is noted at the aortic arch. The great vessels are grossly unremarkable in appearance. Mediastinum/Nodes: No mediastinal lymphadenopathy is seen. No pericardial effusion is identified. The visualized portions of the thyroid gland are unremarkable. No axillary lymphadenopathy is seen. Lungs/Pleura: Small bilateral pleural effusions are noted, right greater than left. Underlying interstitial prominence likely reflects mild pulmonary edema. No pneumothorax is seen. No masses are identified. Musculoskeletal: No acute osseous abnormalities are identified. The visualized musculature is unremarkable in appearance. Review of the MIP images confirms the above findings. CT ABDOMEN and PELVIS FINDINGS Hepatobiliary: A mildly complex collection of fluid is noted at the gallbladder fossa, measuring 6.5 x 4.1 cm, with minimal air. Trace fluid tracks along the duodenum and into the pelvis. A bile leak cannot be excluded. The liver is unremarkable. Scattered clips are noted at the gallbladder fossa. The common bile duct remains normal in caliber. Pancreas: The pancreas is within normal limits. Spleen: The spleen is unremarkable in appearance. Adrenals/Urinary Tract: The adrenal glands are unremarkable in appearance. The kidneys are within normal limits. There is no evidence of hydronephrosis. No renal or ureteral stones are identified. Mild right-sided perinephric fluid is noted. Stomach/Bowel: The stomach is unremarkable in appearance. The small bowel is within normal limits. The patient is status post appendectomy. The colon is unremarkable in appearance. Vascular/Lymphatic: The abdominal aorta is unremarkable in appearance. The inferior vena cava is grossly unremarkable. No retroperitoneal lymphadenopathy is seen. No pelvic sidewall lymphadenopathy is identified. Reproductive: The patient is status post hysterectomy. No suspicious adnexal masses are seen. The bladder is moderately distended. A  likely calcified epiploic appendage is noted within the fluid in the pelvis. Other: No additional soft tissue abnormalities are seen. Musculoskeletal: No acute osseous abnormalities are identified. The visualized musculature is unremarkable in appearance. Review of the MIP images confirms the above findings. IMPRESSION: 1. No evidence of significant pulmonary embolus. 2. Small bilateral pleural effusions, right greater than left. Underlying interstitial prominence likely reflects mild pulmonary edema. 3. Mildly complex collection of fluid at the gallbladder fossa, measuring 6.5 x 4.1 cm, with minimal air. Trace fluid tracks along the duodenum and into the pelvis. This is somewhat more than expected status post cholecystectomy. A bile leak cannot be excluded. Depending on the patient's symptoms, would consider HIDA scan for further evaluation. Electronically Signed   By: Garald Balding M.D.   On: 05/05/2016 02:12   Dg Abd 2 Views  Result Date: 05/04/2016 CLINICAL DATA:  Fever with nausea. EXAM: ABDOMEN - 2 VIEW COMPARISON:  None. FINDINGS: Small effusions with bibasilar atelectasis suspected. No free air, portal venous gas, or pneumatosis. No evidence of bowel obstruction. IMPRESSION: No abnormality seen within the abdomen. Electronically Signed   By: Dorise Bullion III M.D   On: 05/04/2016 21:03    Review of Systems  Constitutional: Positive for malaise/fatigue.  HENT: Negative.   Eyes: Negative.   Respiratory: Negative.   Cardiovascular: Negative.   Gastrointestinal: Positive for abdominal pain, constipation and nausea.  Genitourinary: Negative.   Musculoskeletal: Positive for back pain.  Skin: Negative.   Neurological: Negative.   Endo/Heme/Allergies: Negative.   Psychiatric/Behavioral: Negative.     Blood pressure 151/63, pulse 72, temperature 98.5 F (36.9 C), temperature source Oral, resp. rate 16, height _0  (1.6 m), weight 63 kg (139 lb), SpO2 (!) 87 %. Physical Exam    Constitutional: She is oriented to person, place, and time. She appears well-developed and well-nourished. No distress.  HENT:  Head: Normocephalic and atraumatic.  Eyes: Conjunctivae are normal. Pupils are equal, round, and reactive to light. No scleral icterus.  Neck: Normal range of motion. Neck supple. No tracheal deviation present. No thyromegaly present.  Cardiovascular: Normal rate and intact distal pulses.   Respiratory: Effort normal. No respiratory distress.  GI: Soft. She exhibits distension. She exhibits no mass. There is tenderness (minimal tenderness). There is no rebound and no guarding.  Musculoskeletal: Normal range of motion. She exhibits no edema, tenderness or deformity.  Neurological: She is alert and oriented to person, place, and time. Coordination normal.  Skin: Skin is warm and dry. No rash noted. She is not diaphoretic. No erythema. No pallor.  Psychiatric: She has a normal mood and affect. Her behavior is normal. Judgment and thought content normal.     Assessment/Plan S/p lap chole Abdominal pain Constipation Hypertension Hypoxia  Ct with more than expected post op fluid. Nl WBCs and afebrile. Will rule out bile leak with HIDA.  Suspicion lower given lack of significant tenderness in abdomen.   Will add miralax BID and stool softeners.    Pain medication as needed. Continue incentive spirometry for hypoxia.  No evidence of PE on CT angiogram of chest.      Makenlee Mckeag, MD 05/05/2016, 3:41 AM

## 2016-05-05 NOTE — Progress Notes (Signed)
Subjective: Feels better after pain medications. He describes pain across her epigastrium and into her back.  Objective: Vital signs in last 24 hours: Temp:  [98.4 F (36.9 C)-98.5 F (36.9 C)] 98.4 F (36.9 C) (09/24 2197) Pulse Rate:  [50-87] 71 (09/24 0611) Resp:  [16] 16 (09/24 0611) BP: (151-199)/(63-102) 163/71 (09/24 0611) SpO2:  [87 %-95 %] 94 % (09/24 0611) Weight:  [62.1 kg (136 lb 14.4 oz)-63 kg (139 lb)] 62.1 kg (136 lb 14.4 oz) (09/24 0611) Last BM Date: 04/27/16  Intake/Output from previous day: No intake/output data recorded. Intake/Output this shift: No intake/output data recorded.  General appearance: alert, cooperative and no distress Resp: No wheezing or increased work of breathing GI: Mild epigastric tenderness without guarding Incision/Wound: Clean and dry  Lab Results:   Recent Labs  05/04/16 2115 05/05/16 0619  WBC 7.8 7.2  HGB 11.7* 10.7*  HCT 36.2 33.6*  PLT 293 270   BMET  Recent Labs  05/04/16 2115 05/05/16 0619  NA 138 139  K 3.3* 3.4*  CL 106 106  CO2 27 26  GLUCOSE 101* 86  BUN <5* <5*  CREATININE 0.63 0.60  CALCIUM 8.7* 8.2*   Hepatic Function Latest Ref Rng & Units 05/05/2016 05/04/2016 04/30/2016  Total Protein 6.5 - 8.1 g/dL 5.3(L) 6.2(L) 5.8(L)  Albumin 3.5 - 5.0 g/dL 2.4(L) 2.6(L) 3.0(L)  AST 15 - 41 U/L 22 23 46(H)  ALT 14 - 54 U/L 23 27 34  Alk Phosphatase 38 - 126 U/L 78 86 77  Total Bilirubin 0.3 - 1.2 mg/dL 0.8 1.1 0.6  Bilirubin, Direct 0.0 - 0.3 mg/dL - - -    Studies/Results: Dg Chest 2 View  Result Date: 05/04/2016 CLINICAL DATA:  Nausea and fever. EXAM: CHEST  2 VIEW COMPARISON:  May 01, 2016 FINDINGS: No pneumothorax. The cardiomediastinal silhouette is stable. Suspected small bilateral effusions and bibasilar atelectasis. No convincing evidence of pneumonia. No other interval changes. IMPRESSION: Suspected small effusions and underlying atelectasis. No convincing evidence of pneumonia.  Electronically Signed   By: Dorise Bullion III M.D   On: 05/04/2016 21:03   Ct Angio Chest Pe W And/or Wo Contrast  Result Date: 05/05/2016 CLINICAL DATA:  Status post laparoscopic cholecystectomy. Acute onset of low grade fever and hypoxia. Generalized abdominal pain and nausea. Initial encounter. EXAM: CT ANGIOGRAPHY CHEST CT ABDOMEN AND PELVIS WITH CONTRAST TECHNIQUE: Multidetector CT imaging of the chest was performed using the standard protocol during bolus administration of intravenous contrast. Multiplanar CT image reconstructions and MIPs were obtained to evaluate the vascular anatomy. Multidetector CT imaging of the abdomen and pelvis was performed using the standard protocol during bolus administration of intravenous contrast. CONTRAST:  100 mL of Isovue 370 IV contrast COMPARISON:  Abdominal ultrasound performed 04/29/2016, and CT of the chest, abdomen and pelvis performed 05/09/2011 FINDINGS: CTA CHEST FINDINGS Cardiovascular: There is no evidence of significant pulmonary embolus. The heart is unremarkable in appearance. Mild calcification is noted at the aortic arch. The great vessels are grossly unremarkable in appearance. Mediastinum/Nodes: No mediastinal lymphadenopathy is seen. No pericardial effusion is identified. The visualized portions of the thyroid gland are unremarkable. No axillary lymphadenopathy is seen. Lungs/Pleura: Small bilateral pleural effusions are noted, right greater than left. Underlying interstitial prominence likely reflects mild pulmonary edema. No pneumothorax is seen. No masses are identified. Musculoskeletal: No acute osseous abnormalities are identified. The visualized musculature is unremarkable in appearance. Review of the MIP images confirms the above findings. CT ABDOMEN and PELVIS FINDINGS Hepatobiliary:  A mildly complex collection of fluid is noted at the gallbladder fossa, measuring 6.5 x 4.1 cm, with minimal air. Trace fluid tracks along the duodenum and into  the pelvis. A bile leak cannot be excluded. The liver is unremarkable. Scattered clips are noted at the gallbladder fossa. The common bile duct remains normal in caliber. Pancreas: The pancreas is within normal limits. Spleen: The spleen is unremarkable in appearance. Adrenals/Urinary Tract: The adrenal glands are unremarkable in appearance. The kidneys are within normal limits. There is no evidence of hydronephrosis. No renal or ureteral stones are identified. Mild right-sided perinephric fluid is noted. Stomach/Bowel: The stomach is unremarkable in appearance. The small bowel is within normal limits. The patient is status post appendectomy. The colon is unremarkable in appearance. Vascular/Lymphatic: The abdominal aorta is unremarkable in appearance. The inferior vena cava is grossly unremarkable. No retroperitoneal lymphadenopathy is seen. No pelvic sidewall lymphadenopathy is identified. Reproductive: The patient is status post hysterectomy. No suspicious adnexal masses are seen. The bladder is moderately distended. A likely calcified epiploic appendage is noted within the fluid in the pelvis. Other: No additional soft tissue abnormalities are seen. Musculoskeletal: No acute osseous abnormalities are identified. The visualized musculature is unremarkable in appearance. Review of the MIP images confirms the above findings. IMPRESSION: 1. No evidence of significant pulmonary embolus. 2. Small bilateral pleural effusions, right greater than left. Underlying interstitial prominence likely reflects mild pulmonary edema. 3. Mildly complex collection of fluid at the gallbladder fossa, measuring 6.5 x 4.1 cm, with minimal air. Trace fluid tracks along the duodenum and into the pelvis. This is somewhat more than expected status post cholecystectomy. A bile leak cannot be excluded. Depending on the patient's symptoms, would consider HIDA scan for further evaluation. Electronically Signed   By: Garald Balding M.D.   On:  05/05/2016 02:12   Ct Abdomen Pelvis W Contrast  Result Date: 05/05/2016 CLINICAL DATA:  Status post laparoscopic cholecystectomy. Acute onset of low grade fever and hypoxia. Generalized abdominal pain and nausea. Initial encounter. EXAM: CT ANGIOGRAPHY CHEST CT ABDOMEN AND PELVIS WITH CONTRAST TECHNIQUE: Multidetector CT imaging of the chest was performed using the standard protocol during bolus administration of intravenous contrast. Multiplanar CT image reconstructions and MIPs were obtained to evaluate the vascular anatomy. Multidetector CT imaging of the abdomen and pelvis was performed using the standard protocol during bolus administration of intravenous contrast. CONTRAST:  100 mL of Isovue 370 IV contrast COMPARISON:  Abdominal ultrasound performed 04/29/2016, and CT of the chest, abdomen and pelvis performed 05/09/2011 FINDINGS: CTA CHEST FINDINGS Cardiovascular: There is no evidence of significant pulmonary embolus. The heart is unremarkable in appearance. Mild calcification is noted at the aortic arch. The great vessels are grossly unremarkable in appearance. Mediastinum/Nodes: No mediastinal lymphadenopathy is seen. No pericardial effusion is identified. The visualized portions of the thyroid gland are unremarkable. No axillary lymphadenopathy is seen. Lungs/Pleura: Small bilateral pleural effusions are noted, right greater than left. Underlying interstitial prominence likely reflects mild pulmonary edema. No pneumothorax is seen. No masses are identified. Musculoskeletal: No acute osseous abnormalities are identified. The visualized musculature is unremarkable in appearance. Review of the MIP images confirms the above findings. CT ABDOMEN and PELVIS FINDINGS Hepatobiliary: A mildly complex collection of fluid is noted at the gallbladder fossa, measuring 6.5 x 4.1 cm, with minimal air. Trace fluid tracks along the duodenum and into the pelvis. A bile leak cannot be excluded. The liver is  unremarkable. Scattered clips are noted at the gallbladder fossa. The  common bile duct remains normal in caliber. Pancreas: The pancreas is within normal limits. Spleen: The spleen is unremarkable in appearance. Adrenals/Urinary Tract: The adrenal glands are unremarkable in appearance. The kidneys are within normal limits. There is no evidence of hydronephrosis. No renal or ureteral stones are identified. Mild right-sided perinephric fluid is noted. Stomach/Bowel: The stomach is unremarkable in appearance. The small bowel is within normal limits. The patient is status post appendectomy. The colon is unremarkable in appearance. Vascular/Lymphatic: The abdominal aorta is unremarkable in appearance. The inferior vena cava is grossly unremarkable. No retroperitoneal lymphadenopathy is seen. No pelvic sidewall lymphadenopathy is identified. Reproductive: The patient is status post hysterectomy. No suspicious adnexal masses are seen. The bladder is moderately distended. A likely calcified epiploic appendage is noted within the fluid in the pelvis. Other: No additional soft tissue abnormalities are seen. Musculoskeletal: No acute osseous abnormalities are identified. The visualized musculature is unremarkable in appearance. Review of the MIP images confirms the above findings. IMPRESSION: 1. No evidence of significant pulmonary embolus. 2. Small bilateral pleural effusions, right greater than left. Underlying interstitial prominence likely reflects mild pulmonary edema. 3. Mildly complex collection of fluid at the gallbladder fossa, measuring 6.5 x 4.1 cm, with minimal air. Trace fluid tracks along the duodenum and into the pelvis. This is somewhat more than expected status post cholecystectomy. A bile leak cannot be excluded. Depending on the patient's symptoms, would consider HIDA scan for further evaluation. Electronically Signed   By: Garald Balding M.D.   On: 05/05/2016 02:12   Dg Abd 2 Views  Result Date:  05/04/2016 CLINICAL DATA:  Fever with nausea. EXAM: ABDOMEN - 2 VIEW COMPARISON:  None. FINDINGS: Small effusions with bibasilar atelectasis suspected. No free air, portal venous gas, or pneumatosis. No evidence of bowel obstruction. IMPRESSION: No abnormality seen within the abdomen. Electronically Signed   By: Dorise Bullion III M.D   On: 05/04/2016 21:03    Anti-infectives: Anti-infectives    None      Assessment/Plan: Abdominal pain status post recent laparoscopic cholecystectomy. Somewhat large but not impressive fluid collection in gallbladder fossa. HIDA scan to be done today. No evidence for common bile duct stone. Constipation may be playing a role. Intermittent hypoxia. Sats fine on oxygen and no shortness of breath or increased work of breathing. Might need pulmonary evaluation prior to discharge.    LOS: 0 days    Aniket Paye T 9/24/2017Patient ID: Crystal Kidd, female   DOB: [birth year], 70 y.o.   MRN: [id number]

## 2016-05-06 DIAGNOSIS — J9601 Acute respiratory failure with hypoxia: Secondary | ICD-10-CM

## 2016-05-06 DIAGNOSIS — J90 Pleural effusion, not elsewhere classified: Secondary | ICD-10-CM

## 2016-05-06 MED ORDER — POTASSIUM CHLORIDE CRYS ER 20 MEQ PO TBCR
20.0000 meq | EXTENDED_RELEASE_TABLET | Freq: Once | ORAL | Status: AC
  Administered 2016-05-06: 20 meq via ORAL
  Filled 2016-05-06: qty 1

## 2016-05-06 MED ORDER — MILK AND MOLASSES ENEMA
1.0000 | Freq: Once | RECTAL | Status: AC
  Administered 2016-05-06: 250 mL via RECTAL
  Filled 2016-05-06: qty 250

## 2016-05-06 MED ORDER — FUROSEMIDE 10 MG/ML IJ SOLN
40.0000 mg | Freq: Once | INTRAMUSCULAR | Status: AC
  Administered 2016-05-06: 40 mg via INTRAVENOUS
  Filled 2016-05-06: qty 4

## 2016-05-06 NOTE — Consult Note (Signed)
Name: Crystal Kidd MRN: [id number] DOB: 1945-10-09    ADMISSION DATE:  05/04/2016 CONSULTATION DATE:  05/06/16  REFERRING MD :  Barry Dienes  CHIEF COMPLAINT:  Hypoxia   HISTORY OF PRESENT ILLNESS:  Crystal Kidd is a 70 y.o. female with a PMH as outlined below.  She was admitted 04/30/16 for lap chole due to acute calculous cholecystitis.  She tolerated surgery well and was discharged on 05/02/16.  She apparently had been doing well up until 9/23 when she began to have mild upper abd pain.  Pain worsened to the point she decided to come to ED.  Had not had BM for 8 days.  Also had hypoxia in ED.  CTA negative for PE but demonstrated fluid in GB fossa, more than expected.  She was admitted for further workup by [facility].  HIDA was negative for biloma or obstruction.  Due to persistent hypoxia 9/25, PCCM asked to see.  Denies cough, chest pain, fevers/chills/sweats.  Does not feel as if she is SOB.  PAST MEDICAL HISTORY :   has a past medical history of Allergic rhinoconjunctivitis; Allergy (1992); Arthritis; Cataract; Diverticulosis; GERD (gastroesophageal reflux disease); Hyperlipidemia; Hypertension; Osteopenia; Thyroid disease; Transfusion history (1968); Trapezius muscle spasm (04/13/2015); and Vitiligo.   PAST SURGICAL HISTORY:  has a past surgical history that includes Rotator cuff repair; Abdominal hysterectomy; Breast surgery; Appendectomy; Esophageal dilation; Colonoscopy; Eye surgery; Fracture surgery [id number]); Tubal ligation; Upper gastrointestinal endoscopy; and Cholecystectomy (N/A, 04/30/2016). Prior to Admission medications   Medication Sig Start Date End Date Taking? Authorizing Provider  acetaminophen (TYLENOL) 500 MG tablet Take 500 mg by mouth every 6 (six) hours as needed for headache (pain).   Yes Historical Provider, MD  baclofen (LIORESAL) 10 MG tablet Take 0.5 tablets (5 mg total) by mouth 3 (three) times daily as needed for muscle spasms. 05/02/16  Yes Darci Current [place], PA-C   Calcium Carbonate Antacid (TUMS ULTRA 1000 PO) Take 1,000 mg by mouth See admin instructions. Take 1 tablet (1000 mg) by mouth daily, may take an additional tablet as needed for indigestion   Yes Historical Provider, MD  cholecalciferol (VITAMIN D) 1000 units tablet Take 1,000 Units by mouth daily.   Yes Historical Provider, MD  diphenhydrAMINE (BENADRYL) 25 MG tablet Take 25 mg by mouth at bedtime as needed for allergies.   Yes Historical Provider, MD  FIBER SELECT GUMMIES PO Take 1 tablet by mouth daily as needed (constipation).    Yes Historical Provider, MD  fluticasone (FLONASE) 50 MCG/ACT nasal spray USE 2 SPRAYS IN EACH NOSTRIL EVERY DAY Patient taking differently: USE 2 SPRAYS IN EACH NOSTRIL DAILY AS NEEDED FOR CONGESTION 04/16/16  Yes [name], MD  gabapentin (NEURONTIN) 100 MG capsule TAKE 1 CAPSULE (100 MG TOTAL) BY MOUTH 3 (THREE) TIMES DAILY. EVERY 8 HOURS AS NEEDED Patient taking differently: TAKE 1 CAPSULE (100 MG TOTAL) BY MOUTH EVERY 8 HOURS AS NEEDED FOR NECK PAIN 06/17/15  Yes [name], MD  hydrochlorothiazide (HYDRODIURIL) 25 MG tablet TAKE 1/2 TABLET DAILY AS NEEDED Patient taking differently: TAKE 1/2 TABLET DAILY AS NEEDED FOR SWELLING OR SBP >150 01/15/16  Yes [name], MD  ibuprofen (ADVIL,MOTRIN) 200 MG tablet Take 200 mg by mouth every 6 (six) hours as needed for moderate pain.    Yes Historical Provider, MD  levothyroxine (SYNTHROID, LEVOTHROID) 50 MCG tablet TAKE 1 TABLET DAILY EXCEPT TAKE 1 AND 1/2 TABLETS (75MCG) MONDAY, WEDNESDAY AND FRIDAY AS DIRECTED Patient taking differently: Take 50-75  mcg by mouth See admin instructions. Take 1 tablet (50 mcg) by mouth daily before breakfast on Sunday, Tuesday, Thursday, Saturday, Take 1 1/2 tablet (75 mcg) before breakfast on Monday, Wednesday, Friday. 05/25/15  Yes [name], MD  loratadine (CLARITIN) 10 MG tablet Take 10 mg by mouth daily.   Yes Historical Provider, MD  losartan (COZAAR) 50 MG tablet TAKE 1  AND 1/2 TABLETS EVERY DAY Patient taking differently: Take 75 mg by mouth daily.  04/03/16  Yes [name], MD  omeprazole (PRILOSEC) 40 MG capsule TAKE 1 CAPSULE EVERY DAY 09/06/15  Yes [name], MD  Phenazopyridine HCl (AZO TABS PO) Take 2 tablets by mouth daily as needed (urinary pain).   Yes Historical Provider, MD  Polyethyl Glycol-Propyl Glycol (SYSTANE OP) Place 1 drop into both eyes 4 (four) times daily as needed (dry eyes).   Yes Historical Provider, MD  promethazine (PHENERGAN) 12.5 MG tablet Take 1 tablet (12.5 mg total) by mouth every 8 (eight) hours as needed for nausea or vomiting. 05/26/15  Yes [name], MD  Simethicone (GAS-X PO) Take 3 strips by mouth daily as needed (indigestion).   Yes Historical Provider, MD  simvastatin (ZOCOR) 20 MG tablet TAKE 1 TABLET EVERY EVENING 03/20/16  Yes [name], MD   Allergies  Allergen Reactions  . Besivance [Besifloxacin Hcl] Shortness Of Breath and Other (See Comments)    Dizziness  . Alendronate Sodium Other (See Comments)    REACTION: intolerant ( PMH of esophageal stricture)  . Dilaudid [Hydromorphone Hcl] Itching    Nasal itching; resolution with Benadryl  . Ibandronate Sodium Other (See Comments)     bone pain    FAMILY HISTORY:  family history includes Breast cancer in her mother and sister; COPD in her brother, father, and son; Cancer in her mother; Cancer (age of onset: 34) in her sister; Cancer (age of onset: 34) in her maternal aunt; Colon cancer in her maternal aunt; Colon polyps in her brother and sister; Coronary artery disease in her brother; Stomach cancer in her mother. SOCIAL HISTORY:  reports that she has never smoked. She has never used smokeless tobacco. She reports that she drinks alcohol. She reports that she does not use drugs.  REVIEW OF SYSTEMS:   All negative; except for those that are bolded, which indicate positives.  Constitutional: weight loss, weight gain, night sweats, fevers, chills,  fatigue, weakness.  HEENT: headaches, sore throat, sneezing, nasal congestion, post nasal drip, difficulty swallowing, tooth/dental problems, visual complaints, visual changes, ear aches. Neuro: difficulty with speech, weakness, numbness, ataxia. CV:  chest pain, orthopnea, PND, swelling in lower extremities, dizziness, palpitations, syncope.  Resp: cough, hemoptysis, dyspnea, wheezing. GI: heartburn, indigestion, abdominal pain and bloating, nausea, vomiting, diarrhea, constipation, change in bowel habits, loss of appetite, hematemesis, melena, hematochezia.  GU: dysuria, change in color of urine, urgency or frequency, flank pain, hematuria. MSK: joint pain or swelling, decreased range of motion. Psych: change in mood or affect, depression, anxiety, suicidal ideations, homicidal ideations. Skin: rash, itching, bruising.    SUBJECTIVE:  Currently denies any SOB, chest pain, cough, fevers/chills/sweats.   Is 1.8L positive since admission. Has mild abd pain and distention.  VITAL SIGNS: Temp:  [98.5 F (36.9 C)-99.1 F (37.3 C)] 98.5 F (36.9 C) (09/25 0445) Pulse Rate:  [79-91] 79 (09/25 0445) Resp:  [18] 18 (09/25 0445) BP: (160-165)/(68-82) 160/77 (09/25 0445) SpO2:  [92 %-93 %] 93 % (09/24 2203)  PHYSICAL EXAMINATION: General: Middle aged  female, resting in bed talking on phone, in NAD. Neuro: A&O x 3, non-focal.  HEENT: [place]/AT. PERRL, sclerae anicteric. Cardiovascular: RRR, no M/R/G.  Lungs: Respirations even and unlabored.  Mildly diminished in bases otherwise CTA bilaterally. Abdomen: BS x 4, soft, NT. Mild distention..  Musculoskeletal: No gross deformities, no edema.  Skin: Intact, warm, no rashes.     Recent Labs Lab 04/30/16 0516 05/04/16 2115 05/05/16 0619  NA 136 138 139  K 3.8 3.3* 3.4*  CL 104 106 106  CO2 25 27 26   BUN 7 <5* <5*  CREATININE 0.89 0.63 0.60  GLUCOSE 143* 101* 86    Recent Labs Lab 04/30/16 0516 05/04/16 2115 05/05/16 0619  HGB 13.8  11.7* 10.7*  HCT 42.2 36.2 33.6*  WBC 20.0* 7.8 7.2  PLT 229 293 270   Dg Chest 2 View  Result Date: 05/04/2016 CLINICAL DATA:  Nausea and fever. EXAM: CHEST  2 VIEW COMPARISON:  May 01, 2016 FINDINGS: No pneumothorax. The cardiomediastinal silhouette is stable. Suspected small bilateral effusions and bibasilar atelectasis. No convincing evidence of pneumonia. No other interval changes. IMPRESSION: Suspected small effusions and underlying atelectasis. No convincing evidence of pneumonia. Electronically Signed   By: Dorise Bullion III M.D   On: 05/04/2016 21:03   Ct Angio Chest Pe W And/or Wo Contrast  Result Date: 05/05/2016 CLINICAL DATA:  Status post laparoscopic cholecystectomy. Acute onset of low grade fever and hypoxia. Generalized abdominal pain and nausea. Initial encounter. EXAM: CT ANGIOGRAPHY CHEST CT ABDOMEN AND PELVIS WITH CONTRAST TECHNIQUE: Multidetector CT imaging of the chest was performed using the standard protocol during bolus administration of intravenous contrast. Multiplanar CT image reconstructions and MIPs were obtained to evaluate the vascular anatomy. Multidetector CT imaging of the abdomen and pelvis was performed using the standard protocol during bolus administration of intravenous contrast. CONTRAST:  100 mL of Isovue 370 IV contrast COMPARISON:  Abdominal ultrasound performed 04/29/2016, and CT of the chest, abdomen and pelvis performed 05/09/2011 FINDINGS: CTA CHEST FINDINGS Cardiovascular: There is no evidence of significant pulmonary embolus. The heart is unremarkable in appearance. Mild calcification is noted at the aortic arch. The great vessels are grossly unremarkable in appearance. Mediastinum/Nodes: No mediastinal lymphadenopathy is seen. No pericardial effusion is identified. The visualized portions of the thyroid gland are unremarkable. No axillary lymphadenopathy is seen. Lungs/Pleura: Small bilateral pleural effusions are noted, right greater than left.  Underlying interstitial prominence likely reflects mild pulmonary edema. No pneumothorax is seen. No masses are identified. Musculoskeletal: No acute osseous abnormalities are identified. The visualized musculature is unremarkable in appearance. Review of the MIP images confirms the above findings. CT ABDOMEN and PELVIS FINDINGS Hepatobiliary: A mildly complex collection of fluid is noted at the gallbladder fossa, measuring 6.5 x 4.1 cm, with minimal air. Trace fluid tracks along the duodenum and into the pelvis. A bile leak cannot be excluded. The liver is unremarkable. Scattered clips are noted at the gallbladder fossa. The common bile duct remains normal in caliber. Pancreas: The pancreas is within normal limits. Spleen: The spleen is unremarkable in appearance. Adrenals/Urinary Tract: The adrenal glands are unremarkable in appearance. The kidneys are within normal limits. There is no evidence of hydronephrosis. No renal or ureteral stones are identified. Mild right-sided perinephric fluid is noted. Stomach/Bowel: The stomach is unremarkable in appearance. The small bowel is within normal limits. The patient is status post appendectomy. The colon is unremarkable in appearance. Vascular/Lymphatic: The abdominal aorta is unremarkable in appearance. The inferior vena cava is  grossly unremarkable. No retroperitoneal lymphadenopathy is seen. No pelvic sidewall lymphadenopathy is identified. Reproductive: The patient is status post hysterectomy. No suspicious adnexal masses are seen. The bladder is moderately distended. A likely calcified epiploic appendage is noted within the fluid in the pelvis. Other: No additional soft tissue abnormalities are seen. Musculoskeletal: No acute osseous abnormalities are identified. The visualized musculature is unremarkable in appearance. Review of the MIP images confirms the above findings. IMPRESSION: 1. No evidence of significant pulmonary embolus. 2. Small bilateral pleural  effusions, right greater than left. Underlying interstitial prominence likely reflects mild pulmonary edema. 3. Mildly complex collection of fluid at the gallbladder fossa, measuring 6.5 x 4.1 cm, with minimal air. Trace fluid tracks along the duodenum and into the pelvis. This is somewhat more than expected status post cholecystectomy. A bile leak cannot be excluded. Depending on the patient's symptoms, would consider HIDA scan for further evaluation. Electronically Signed   By: Garald Balding M.D.   On: 05/05/2016 02:12   Nm Hepatobiliary Including Gb  Result Date: 05/05/2016 CLINICAL DATA:  Cholecystectomy 5 days ago. Nausea. Lack of bowel movements. Back pain. Right upper quadrant fluid collection on CT. EXAM: NUCLEAR MEDICINE HEPATOBILIARY IMAGING TECHNIQUE: Sequential images of the abdomen were obtained out to 60 minutes following intravenous administration of radiopharmaceutical. RADIOPHARMACEUTICALS:  5.5 MCi Tc-65m  Choletec IV COMPARISON:  CT of earlier today. FINDINGS: Prompt uptake of tracer by the liver. There is early medial porta hepatis activity which is all felt to be in the common duct. The postoperative fluid collection is positioned more laterally, when correlated with CT. This does not persist, arguing against biloma. Activity within the bowel at 15 minutes excludes biliary obstruction. IMPRESSION: No evidence of biloma or biliary obstruction. Electronically Signed   By: Abigail Miyamoto M.D.   On: 05/05/2016 12:02   Ct Abdomen Pelvis W Contrast  Result Date: 05/05/2016 CLINICAL DATA:  Status post laparoscopic cholecystectomy. Acute onset of low grade fever and hypoxia. Generalized abdominal pain and nausea. Initial encounter. EXAM: CT ANGIOGRAPHY CHEST CT ABDOMEN AND PELVIS WITH CONTRAST TECHNIQUE: Multidetector CT imaging of the chest was performed using the standard protocol during bolus administration of intravenous contrast. Multiplanar CT image reconstructions and MIPs were obtained to  evaluate the vascular anatomy. Multidetector CT imaging of the abdomen and pelvis was performed using the standard protocol during bolus administration of intravenous contrast. CONTRAST:  100 mL of Isovue 370 IV contrast COMPARISON:  Abdominal ultrasound performed 04/29/2016, and CT of the chest, abdomen and pelvis performed 05/09/2011 FINDINGS: CTA CHEST FINDINGS Cardiovascular: There is no evidence of significant pulmonary embolus. The heart is unremarkable in appearance. Mild calcification is noted at the aortic arch. The great vessels are grossly unremarkable in appearance. Mediastinum/Nodes: No mediastinal lymphadenopathy is seen. No pericardial effusion is identified. The visualized portions of the thyroid gland are unremarkable. No axillary lymphadenopathy is seen. Lungs/Pleura: Small bilateral pleural effusions are noted, right greater than left. Underlying interstitial prominence likely reflects mild pulmonary edema. No pneumothorax is seen. No masses are identified. Musculoskeletal: No acute osseous abnormalities are identified. The visualized musculature is unremarkable in appearance. Review of the MIP images confirms the above findings. CT ABDOMEN and PELVIS FINDINGS Hepatobiliary: A mildly complex collection of fluid is noted at the gallbladder fossa, measuring 6.5 x 4.1 cm, with minimal air. Trace fluid tracks along the duodenum and into the pelvis. A bile leak cannot be excluded. The liver is unremarkable. Scattered clips are noted at the gallbladder fossa. The common  bile duct remains normal in caliber. Pancreas: The pancreas is within normal limits. Spleen: The spleen is unremarkable in appearance. Adrenals/Urinary Tract: The adrenal glands are unremarkable in appearance. The kidneys are within normal limits. There is no evidence of hydronephrosis. No renal or ureteral stones are identified. Mild right-sided perinephric fluid is noted. Stomach/Bowel: The stomach is unremarkable in appearance. The  small bowel is within normal limits. The patient is status post appendectomy. The colon is unremarkable in appearance. Vascular/Lymphatic: The abdominal aorta is unremarkable in appearance. The inferior vena cava is grossly unremarkable. No retroperitoneal lymphadenopathy is seen. No pelvic sidewall lymphadenopathy is identified. Reproductive: The patient is status post hysterectomy. No suspicious adnexal masses are seen. The bladder is moderately distended. A likely calcified epiploic appendage is noted within the fluid in the pelvis. Other: No additional soft tissue abnormalities are seen. Musculoskeletal: No acute osseous abnormalities are identified. The visualized musculature is unremarkable in appearance. Review of the MIP images confirms the above findings. IMPRESSION: 1. No evidence of significant pulmonary embolus. 2. Small bilateral pleural effusions, right greater than left. Underlying interstitial prominence likely reflects mild pulmonary edema. 3. Mildly complex collection of fluid at the gallbladder fossa, measuring 6.5 x 4.1 cm, with minimal air. Trace fluid tracks along the duodenum and into the pelvis. This is somewhat more than expected status post cholecystectomy. A bile leak cannot be excluded. Depending on the patient's symptoms, would consider HIDA scan for further evaluation. Electronically Signed   By: Garald Balding M.D.   On: 05/05/2016 02:12   Dg Abd 2 Views  Result Date: 05/04/2016 CLINICAL DATA:  Fever with nausea. EXAM: ABDOMEN - 2 VIEW COMPARISON:  None. FINDINGS: Small effusions with bibasilar atelectasis suspected. No free air, portal venous gas, or pneumatosis. No evidence of bowel obstruction. IMPRESSION: No abnormality seen within the abdomen. Electronically Signed   By: Dorise Bullion III M.D   On: 05/04/2016 21:03    STUDIES:  CTA chest 9/24 > no PE, small b/l pleural effusions R > L, mild pulm edema.  SIGNIFICANT EVENTS  9/19 > lap chole 9/21 > discharge 9/24 >  admitted for abd pain 9/25 > PCCM consulted for hypoxia  ASSESSMENT / PLAN:  Acute hypoxic respiratory failure - likely combo of post op atelectasis, splinting from abd pain, acute pulm edema, mild pleural effusions.  CTA negative for PE. Plan: Continue supplemental O2 as needed to maintain SpO2 > 92%. Assess ambulatory desat study for home O2 needs. Lasix 40mg  x 1. Incentive spirometry, flutter valve. Ambulate / mobilize as able.  PCCM will sign off.  Please do not hesitate to call us back if we can be of any further assistance.   Montey Hora, [place] [facility] Pager: [phone]  or [phone] 05/06/2016, 11:31 AM  PCCM Attending Note: Patient seen and examined with physician's assistant. Please refer to his consultation note which I have reviewed in detail. Patient reports intermittent coughing. Denies any subjective fever, chills, or sweats. Denies any dyspnea on exertion. Reports she does snore at night but denies any morning headaches, dosing, or daytime naps. Denies any chest pain or pressure. Patient did have abdominal pain radiating to her back on presentation but denies any current nausea or vomiting.  BP (!) 161/80 (BP Location: Left Arm)   Pulse 83   Temp 98.4 F (36.9 C) (Oral)   Resp 18   Ht 5\' 3"  (1.6 m)   Wt 136 lb  14.4 oz (62.1 kg)   SpO2 93%   BMI 24.25 kg/m  General:  Awake. Alert. Patient just ambulated from bathroom to her bed on room air.  Integument:  Warm & dry. No rash on exposed skin.  HEENT:  Moist mucus membranes. No oral ulcers. No scleral injection or icterus.  Cardiovascular:  Regular rate. No edema. No appreciable JVD.  Pulmonary: Mild crackles in bilateral lung bases. Normal work of breathing on room air. Speaking in complete sentences. Incentive spirometry over 750 mL. Abdomen: Soft. Normal bowel sounds. Nondistended.  Musculoskeletal:  Normal bulk and tone. No joint deformity or effusion  appreciated.  CTA Chest:  Reviewed by me. Mild compressive atelectasis with small bilateral pleural effusions right greater the left.  A/P: 1. Hypoxia: Predominantly with exertion. Likely secondary to atelectasis. No evidence of volume overload on physical exam but questionable on CT scan of the chest. Lasix given IV 1. Counseled patient on appropriate pulmonary toilet with incentive spirometry and flutter valve. Recommend continued mobilization & ambulatory O2 saturation prior to discharge. 2. Bilateral pleural effusions: Recommend repeat CT chest without contrast to ensure resolution as an outpatient.  Please contact me if we can be of any further assistance in the care of this patient.  Sonia Baller Ashok Cordia, M.D. [facility] Pager:  [phone] After 3pm or if no response, call [phone] 5:05 PM 05/06/16

## 2016-05-06 NOTE — Progress Notes (Signed)
SATURATION QUALIFICATIONS: (This note is used to comply with regulatory documentation for home oxygen)  Patient Saturations on Room Air at Rest = 90%  Patient Saturations on Room Air while Ambulating = 85%

## 2016-05-06 NOTE — Progress Notes (Signed)
Patient ID: Crystal Kidd, female   DOB: 1945-08-14, 70 y.o.   MRN: [id number]  [facility] Surgery Progress Note     Subjective: Feeling better today than yesterday. Some continued epigastric pain radiating into back, and nausea. +flatus no BM. Feels like she may have to have a BM soon.  Objective: Vital signs in last 24 hours: Temp:  [98.5 F (36.9 C)-99.1 F (37.3 C)] 98.5 F (36.9 C) (09/25 0445) Pulse Rate:  [76-91] 79 (09/25 0445) Resp:  [18] 18 (09/25 0445) BP: (160-173)/(68-82) 160/77 (09/25 0445) SpO2:  [91 %-93 %] 93 % (09/24 2203) Last BM Date: 04/27/16  Intake/Output from previous day: 09/24 0701 - 09/25 0700 In: 2078.3 [I.V.:2078.3] Out: 300 [Urine:300] Intake/Output this shift: No intake/output data recorded.  PE: Gen:  Alert, NAD, pleasant Pulm:  Mild rhonchi bilateral lungs, effort normal Abd: Soft, ND, +BS, mild TTP RUQ, incisions C/D/I  Lab Results:   Recent Labs  05/04/16 2115 05/05/16 0619  WBC 7.8 7.2  HGB 11.7* 10.7*  HCT 36.2 33.6*  PLT 293 270   BMET  Recent Labs  05/04/16 2115 05/05/16 0619  NA 138 139  K 3.3* 3.4*  CL 106 106  CO2 27 26  GLUCOSE 101* 86  BUN <5* <5*  CREATININE 0.63 0.60  CALCIUM 8.7* 8.2*   PT/INR No results for input(s): LABPROT, INR in the last 72 hours. CMP     Component Value Date/Time   NA 139 05/05/2016 0619   NA 140 06/13/2014 1102   K 3.4 (L) 05/05/2016 0619   CL 106 05/05/2016 0619   CO2 26 05/05/2016 0619   GLUCOSE 86 05/05/2016 0619   BUN <5 (L) 05/05/2016 0619   BUN 10 06/13/2014 1102   CREATININE 0.60 05/05/2016 0619   CALCIUM 8.2 (L) 05/05/2016 0619   PROT 5.3 (L) 05/05/2016 0619   PROT 6.5 06/13/2014 1102   ALBUMIN 2.4 (L) 05/05/2016 0619   ALBUMIN 4.2 06/13/2014 1102   AST 22 05/05/2016 0619   ALT 23 05/05/2016 0619   ALKPHOS 78 05/05/2016 0619   BILITOT 0.8 05/05/2016 0619   GFRNONAA >60 05/05/2016 0619   GFRAA >60 05/05/2016 0619   Lipase     Component Value  Date/Time   LIPASE 55 (H) 05/04/2016 2115       Studies/Results: Dg Chest 2 View  Result Date: 05/04/2016 CLINICAL DATA:  Nausea and fever. EXAM: CHEST  2 VIEW COMPARISON:  May 01, 2016 FINDINGS: No pneumothorax. The cardiomediastinal silhouette is stable. Suspected small bilateral effusions and bibasilar atelectasis. No convincing evidence of pneumonia. No other interval changes. IMPRESSION: Suspected small effusions and underlying atelectasis. No convincing evidence of pneumonia. Electronically Signed   By: Dorise Bullion III M.D   On: 05/04/2016 21:03   Ct Angio Chest Pe W And/or Wo Contrast  Result Date: 05/05/2016 CLINICAL DATA:  Status post laparoscopic cholecystectomy. Acute onset of low grade fever and hypoxia. Generalized abdominal pain and nausea. Initial encounter. EXAM: CT ANGIOGRAPHY CHEST CT ABDOMEN AND PELVIS WITH CONTRAST TECHNIQUE: Multidetector CT imaging of the chest was performed using the standard protocol during bolus administration of intravenous contrast. Multiplanar CT image reconstructions and MIPs were obtained to evaluate the vascular anatomy. Multidetector CT imaging of the abdomen and pelvis was performed using the standard protocol during bolus administration of intravenous contrast. CONTRAST:  100 mL of Isovue 370 IV contrast COMPARISON:  Abdominal ultrasound performed 04/29/2016, and CT of the chest, abdomen and pelvis performed 05/09/2011 FINDINGS: CTA CHEST FINDINGS  Cardiovascular: There is no evidence of significant pulmonary embolus. The heart is unremarkable in appearance. Mild calcification is noted at the aortic arch. The great vessels are grossly unremarkable in appearance. Mediastinum/Nodes: No mediastinal lymphadenopathy is seen. No pericardial effusion is identified. The visualized portions of the thyroid gland are unremarkable. No axillary lymphadenopathy is seen. Lungs/Pleura: Small bilateral pleural effusions are noted, right greater than left.  Underlying interstitial prominence likely reflects mild pulmonary edema. No pneumothorax is seen. No masses are identified. Musculoskeletal: No acute osseous abnormalities are identified. The visualized musculature is unremarkable in appearance. Review of the MIP images confirms the above findings. CT ABDOMEN and PELVIS FINDINGS Hepatobiliary: A mildly complex collection of fluid is noted at the gallbladder fossa, measuring 6.5 x 4.1 cm, with minimal air. Trace fluid tracks along the duodenum and into the pelvis. A bile leak cannot be excluded. The liver is unremarkable. Scattered clips are noted at the gallbladder fossa. The common bile duct remains normal in caliber. Pancreas: The pancreas is within normal limits. Spleen: The spleen is unremarkable in appearance. Adrenals/Urinary Tract: The adrenal glands are unremarkable in appearance. The kidneys are within normal limits. There is no evidence of hydronephrosis. No renal or ureteral stones are identified. Mild right-sided perinephric fluid is noted. Stomach/Bowel: The stomach is unremarkable in appearance. The small bowel is within normal limits. The patient is status post appendectomy. The colon is unremarkable in appearance. Vascular/Lymphatic: The abdominal aorta is unremarkable in appearance. The inferior vena cava is grossly unremarkable. No retroperitoneal lymphadenopathy is seen. No pelvic sidewall lymphadenopathy is identified. Reproductive: The patient is status post hysterectomy. No suspicious adnexal masses are seen. The bladder is moderately distended. A likely calcified epiploic appendage is noted within the fluid in the pelvis. Other: No additional soft tissue abnormalities are seen. Musculoskeletal: No acute osseous abnormalities are identified. The visualized musculature is unremarkable in appearance. Review of the MIP images confirms the above findings. IMPRESSION: 1. No evidence of significant pulmonary embolus. 2. Small bilateral pleural  effusions, right greater than left. Underlying interstitial prominence likely reflects mild pulmonary edema. 3. Mildly complex collection of fluid at the gallbladder fossa, measuring 6.5 x 4.1 cm, with minimal air. Trace fluid tracks along the duodenum and into the pelvis. This is somewhat more than expected status post cholecystectomy. A bile leak cannot be excluded. Depending on the patient's symptoms, would consider HIDA scan for further evaluation. Electronically Signed   By: Garald Balding M.D.   On: 05/05/2016 02:12   Nm Hepatobiliary Including Gb  Result Date: 05/05/2016 CLINICAL DATA:  Cholecystectomy 5 days ago. Nausea. Lack of bowel movements. Back pain. Right upper quadrant fluid collection on CT. EXAM: NUCLEAR MEDICINE HEPATOBILIARY IMAGING TECHNIQUE: Sequential images of the abdomen were obtained out to 60 minutes following intravenous administration of radiopharmaceutical. RADIOPHARMACEUTICALS:  5.5 MCi Tc-55m  Choletec IV COMPARISON:  CT of earlier today. FINDINGS: Prompt uptake of tracer by the liver. There is early medial porta hepatis activity which is all felt to be in the common duct. The postoperative fluid collection is positioned more laterally, when correlated with CT. This does not persist, arguing against biloma. Activity within the bowel at 15 minutes excludes biliary obstruction. IMPRESSION: No evidence of biloma or biliary obstruction. Electronically Signed   By: Abigail Miyamoto M.D.   On: 05/05/2016 12:02   Ct Abdomen Pelvis W Contrast  Result Date: 05/05/2016 CLINICAL DATA:  Status post laparoscopic cholecystectomy. Acute onset of low grade fever and hypoxia. Generalized abdominal pain and nausea.  Initial encounter. EXAM: CT ANGIOGRAPHY CHEST CT ABDOMEN AND PELVIS WITH CONTRAST TECHNIQUE: Multidetector CT imaging of the chest was performed using the standard protocol during bolus administration of intravenous contrast. Multiplanar CT image reconstructions and MIPs were obtained to  evaluate the vascular anatomy. Multidetector CT imaging of the abdomen and pelvis was performed using the standard protocol during bolus administration of intravenous contrast. CONTRAST:  100 mL of Isovue 370 IV contrast COMPARISON:  Abdominal ultrasound performed 04/29/2016, and CT of the chest, abdomen and pelvis performed 05/09/2011 FINDINGS: CTA CHEST FINDINGS Cardiovascular: There is no evidence of significant pulmonary embolus. The heart is unremarkable in appearance. Mild calcification is noted at the aortic arch. The great vessels are grossly unremarkable in appearance. Mediastinum/Nodes: No mediastinal lymphadenopathy is seen. No pericardial effusion is identified. The visualized portions of the thyroid gland are unremarkable. No axillary lymphadenopathy is seen. Lungs/Pleura: Small bilateral pleural effusions are noted, right greater than left. Underlying interstitial prominence likely reflects mild pulmonary edema. No pneumothorax is seen. No masses are identified. Musculoskeletal: No acute osseous abnormalities are identified. The visualized musculature is unremarkable in appearance. Review of the MIP images confirms the above findings. CT ABDOMEN and PELVIS FINDINGS Hepatobiliary: A mildly complex collection of fluid is noted at the gallbladder fossa, measuring 6.5 x 4.1 cm, with minimal air. Trace fluid tracks along the duodenum and into the pelvis. A bile leak cannot be excluded. The liver is unremarkable. Scattered clips are noted at the gallbladder fossa. The common bile duct remains normal in caliber. Pancreas: The pancreas is within normal limits. Spleen: The spleen is unremarkable in appearance. Adrenals/Urinary Tract: The adrenal glands are unremarkable in appearance. The kidneys are within normal limits. There is no evidence of hydronephrosis. No renal or ureteral stones are identified. Mild right-sided perinephric fluid is noted. Stomach/Bowel: The stomach is unremarkable in appearance. The  small bowel is within normal limits. The patient is status post appendectomy. The colon is unremarkable in appearance. Vascular/Lymphatic: The abdominal aorta is unremarkable in appearance. The inferior vena cava is grossly unremarkable. No retroperitoneal lymphadenopathy is seen. No pelvic sidewall lymphadenopathy is identified. Reproductive: The patient is status post hysterectomy. No suspicious adnexal masses are seen. The bladder is moderately distended. A likely calcified epiploic appendage is noted within the fluid in the pelvis. Other: No additional soft tissue abnormalities are seen. Musculoskeletal: No acute osseous abnormalities are identified. The visualized musculature is unremarkable in appearance. Review of the MIP images confirms the above findings. IMPRESSION: 1. No evidence of significant pulmonary embolus. 2. Small bilateral pleural effusions, right greater than left. Underlying interstitial prominence likely reflects mild pulmonary edema. 3. Mildly complex collection of fluid at the gallbladder fossa, measuring 6.5 x 4.1 cm, with minimal air. Trace fluid tracks along the duodenum and into the pelvis. This is somewhat more than expected status post cholecystectomy. A bile leak cannot be excluded. Depending on the patient's symptoms, would consider HIDA scan for further evaluation. Electronically Signed   By: Garald Balding M.D.   On: 05/05/2016 02:12   Dg Abd 2 Views  Result Date: 05/04/2016 CLINICAL DATA:  Fever with nausea. EXAM: ABDOMEN - 2 VIEW COMPARISON:  None. FINDINGS: Small effusions with bibasilar atelectasis suspected. No free air, portal venous gas, or pneumatosis. No evidence of bowel obstruction. IMPRESSION: No abnormality seen within the abdomen. Electronically Signed   By: Dorise Bullion III M.D   On: 05/04/2016 21:03    Anti-infectives: Anti-infectives    None  Assessment/Plan Abdominal pain S/p lap chole 04/30/2016  - CT 9/24 showed fluid collection  gallbladder fossa 6.5x4.1cm - HIDA 9/24 shows no evidence of biloma or biliary obstruction Constipation - last BM >1 week ago - continue miralax BID and stool softeners. Will add enema. Hypertension Hypoxia - No evidence of PE on CT angiogram of chest - Sats fine on O2 and no SOB or labored respirations. States that hypoxia typically occurs at night. - will consult pulmonology - continue IS  FEN - clears, IVF VTE - lovenox  Plan - enema. continue miralax BID and stool softeners, mobilize. Labs tomorrow. Pulmonology consult pending.   LOS: 1 day    Jerrye Beavers , [facility] Surgery 05/06/2016, 9:27 AM Pager: [phone] Consults: [phone] Mon-Fri 7:00 am-4:30 pm Sat-Sun 7:00 am-11:30 am

## 2016-05-07 ENCOUNTER — Ambulatory Visit: Payer: Commercial Managed Care - HMO | Admitting: Internal Medicine

## 2016-05-07 ENCOUNTER — Telehealth: Payer: Self-pay

## 2016-05-07 LAB — CBC
HEMATOCRIT: 36.5 % (ref 36.0–46.0)
HEMOGLOBIN: 11.7 g/dL — AB (ref 12.0–15.0)
MCH: 28.8 pg (ref 26.0–34.0)
MCHC: 32.1 g/dL (ref 30.0–36.0)
MCV: 89.9 fL (ref 78.0–100.0)
Platelets: 385 10*3/uL (ref 150–400)
RBC: 4.06 MIL/uL (ref 3.87–5.11)
RDW: 14 % (ref 11.5–15.5)
WBC: 7.4 10*3/uL (ref 4.0–10.5)

## 2016-05-07 LAB — BASIC METABOLIC PANEL
Anion gap: 6 (ref 5–15)
BUN: <5 mg/dL — ABNORMAL LOW (ref 6–20)
CHLORIDE: 102 mmol/L (ref 101–111)
CO2: 29 mmol/L (ref 22–32)
CREATININE: 0.72 mg/dL (ref 0.44–1.00)
Calcium: 8.6 mg/dL — ABNORMAL LOW (ref 8.9–10.3)
GFR calc non Af Amer: >60 mL/min (ref >60)
Glucose, Bld: 117 mg/dL — ABNORMAL HIGH (ref 65–99)
Potassium: 3.5 mmol/L (ref 3.5–5.1)
Sodium: 137 mmol/L (ref 135–145)

## 2016-05-07 NOTE — Progress Notes (Signed)
Patient ID: Crystal Kidd, female   DOB: [birth year], 70 y.o.   MRN: [id number]  [facility] Surgery Progress Note     Subjective: Had BM yesterday and felt better. Felt well this morning until after she ate a piece of toast, after this she had increased abdominal pain and bloating. She later had another BM and felt slightly better. Continues to complain of some abdominal pain, although it is less than when she came to the hospital.  Objective: Vital signs in last 24 hours: Temp:  [98.3 F (36.8 C)-100.1 F (37.8 C)] 98.3 F (36.8 C) (09/26 0455) Pulse Rate:  [83-85] 85 (09/26 0455) Resp:  [16-18] 17 (09/26 0455) BP: (145-161)/(73-80) 145/79 (09/26 0455) SpO2:  [93 %] 93 % (09/26 0455) Last BM Date: 05/06/16  Intake/Output from previous day: 09/25 0701 - 09/26 0700 In: 2750 [P.O.:480; I.V.:2270] Out: -  Intake/Output this shift: Total I/O In: 240 [P.O.:240] Out: -   PE: Gen:  Alert, NAD, pleasant Pulm:  CTAB, effort normal Abd: Soft, mildly distended, +BS, mild TTP RUQ, incisions C/D/I  Lab Results:   Recent Labs  05/05/16 0619 05/07/16 0517  WBC 7.2 7.4  HGB 10.7* 11.7*  HCT 33.6* 36.5  PLT 270 385   BMET  Recent Labs  05/05/16 0619 05/07/16 0517  NA 139 137  K 3.4* 3.5  CL 106 102  CO2 26 29  GLUCOSE 86 117*  BUN <5* <5*  CREATININE 0.60 0.72  CALCIUM 8.2* 8.6*   PT/INR No results for input(s): LABPROT, INR in the last 72 hours. CMP     Component Value Date/Time   NA 137 05/07/2016 0517   NA 140 06/13/2014 1102   K 3.5 05/07/2016 0517   CL 102 05/07/2016 0517   CO2 29 05/07/2016 0517   GLUCOSE 117 (H) 05/07/2016 0517   BUN <5 (L) 05/07/2016 0517   BUN 10 06/13/2014 1102   CREATININE 0.72 05/07/2016 0517   CALCIUM 8.6 (L) 05/07/2016 0517   PROT 5.3 (L) 05/05/2016 0619   PROT 6.5 06/13/2014 1102   ALBUMIN 2.4 (L) 05/05/2016 0619   ALBUMIN 4.2 06/13/2014 1102   AST 22 05/05/2016 0619   ALT 23 05/05/2016 0619   ALKPHOS 78 05/05/2016  0619   BILITOT 0.8 05/05/2016 0619   GFRNONAA >60 05/07/2016 0517   GFRAA >60 05/07/2016 0517   Lipase     Component Value Date/Time   LIPASE 55 (H) 05/04/2016 2115       Studies/Results: Nm Hepatobiliary Including Gb  Result Date: 05/05/2016 CLINICAL DATA:  Cholecystectomy 5 days ago. Nausea. Lack of bowel movements. Back pain. Right upper quadrant fluid collection on CT. EXAM: NUCLEAR MEDICINE HEPATOBILIARY IMAGING TECHNIQUE: Sequential images of the abdomen were obtained out to 60 minutes following intravenous administration of radiopharmaceutical. RADIOPHARMACEUTICALS:  5.5 MCi Tc-76m  Choletec IV COMPARISON:  CT of earlier today. FINDINGS: Prompt uptake of tracer by the liver. There is early medial porta hepatis activity which is all felt to be in the common duct. The postoperative fluid collection is positioned more laterally, when correlated with CT. This does not persist, arguing against biloma. Activity within the bowel at 15 minutes excludes biliary obstruction. IMPRESSION: No evidence of biloma or biliary obstruction. Electronically Signed   By: Abigail Miyamoto M.D.   On: 05/05/2016 12:02    Anti-infectives: Anti-infectives    None       Assessment/Plan Abdominal pain S/p lap chole 04/30/2016  - CT 9/24 showed fluid collection gallbladder fossa 6.5x4.1cm -  HIDA 9/24 shows no evidence of biloma or biliary obstruction - WBC normal, afebrile Constipation - 1 BM yesterday and 1 today. Pain improves after Bm, but worsens after eating - continue stool softeners - encouraged more fluids Hypertension Hypoxia - seen by pulmonology who recommends continued mobilization & ambulatory O2 saturation prior to discharge, as well as repeat CT chest without contrast as outpatient to ensure resolution of bilateral pleural effusions  FEN - soft, IVF VTE - lovenox  Plan - will see how she does with lunch. Encouraged more fluids and mobilization. Continue stool softeners.     LOS: 2  days    Jerrye Beavers , [facility] [place] [facility] 05/07/2016, 11:18 AM Pager: [phone] Consults: [phone] Mon-Fri 7:00 am-4:30 pm Sat-Sun 7:00 am-11:30 am    Addendum: Rechecked patient after lunch. Continues to complain of worsening abdominal pain. Also states that she has chills, no fever. No n/v. Will continue to check temps (nurse notified) and CBC in a.m.  BROOKE A MILLER

## 2016-05-07 NOTE — Care Management Note (Signed)
Case Management Note  Patient Details  Name: Crystal Kidd MRN: [id number] Date of Birth: 1945/11/09  Subjective/Objective:                    Action/Plan: Discussed oxygen saturations at patient bedside with Hazle Nordmann PA. Patient will discharge home on home oxygen , she will have a follow up appointment with her PCP.   Expected Discharge Date:                  Expected Discharge Plan:  Home/Self Care  In-House Referral:     Discharge planning Services     Post Acute Care Choice:    Choice offered to:  Patient  DME Arranged:    DME Agency:     HH Arranged:    [place] Agency:     Status of Service:  Completed, signed off  If discussed at [employer] of Stay Meetings, dates discussed:    Additional Comments:  Marilu Favre, RN 05/07/2016, 11:54 AM

## 2016-05-07 NOTE — Telephone Encounter (Signed)
Unable to reach patient.  Attempt to follow up with transitional care management since most recent visit.  Left message (HIPPA compliant) to call the office back.  Will follow as appropriate.

## 2016-05-08 ENCOUNTER — Telehealth: Payer: Self-pay

## 2016-05-08 LAB — CBC
HCT: 38.1 % (ref 36.0–46.0)
HEMOGLOBIN: 11.9 g/dL — AB (ref 12.0–15.0)
MCH: 28.5 pg (ref 26.0–34.0)
MCHC: 31.2 g/dL (ref 30.0–36.0)
MCV: 91.1 fL (ref 78.0–100.0)
Platelets: 406 10*3/uL — ABNORMAL HIGH (ref 150–400)
RBC: 4.18 MIL/uL (ref 3.87–5.11)
RDW: 14.2 % (ref 11.5–15.5)
WBC: 7.1 10*3/uL (ref 4.0–10.5)

## 2016-05-08 LAB — LIPASE, BLOOD: LIPASE: 39 U/L (ref 11–51)

## 2016-05-08 MED ORDER — ONDANSETRON HCL 4 MG PO TABS: 4.0000 mg | ORAL_TABLET | Freq: Three times a day (TID) | ORAL | 5 refills | Status: DC | PRN

## 2016-05-08 MED ORDER — POLYETHYLENE GLYCOL 3350 17 G PO PACK: 17.0000 g | PACK | Freq: Two times a day (BID) | ORAL | 0 refills | Status: DC

## 2016-05-08 MED ORDER — SENNA 8.6 MG PO TABS: 1.0000 | ORAL_TABLET | Freq: Every day | ORAL | 0 refills | Status: DC | PRN

## 2016-05-08 NOTE — Care Management Important Message (Signed)
Important Message  Patient Details  Name: TAKINA NOVEL MRN: [id number] Date of Birth: [birth year]   Medicare Important Message Given:  Yes    Cathie Bonnell 05/08/2016, 10:30 AM

## 2016-05-08 NOTE — Care Management Important Message (Signed)
Important Message  Patient Details  Name: Crystal Kidd MRN: [id number] Date of Birth: 1946-06-12   Medicare Important Message Given:  Yes    Philopateer Strine 05/08/2016, 10:30 AM

## 2016-05-08 NOTE — Discharge Instructions (Signed)
Please follow-up closely with your surgeon for recheck in few days.  Return for worsening symptoms, including recurrent fevers, severe abdominal pain, difficulty breathing, confusion, or any other symptoms concerning to you.  Low-Fat Diet for Pancreatitis or Gallbladder Conditions A low-fat diet can be helpful if you have pancreatitis or a gallbladder condition. With these conditions, your pancreas and gallbladder have trouble digesting fats. A healthy eating plan with less fat will help rest your pancreas and gallbladder and reduce your symptoms. After gallbladder surgery, a low fat diet will help prevent stomach upset, bloating, and diarrhea.  WHAT DO I NEED TO KNOW ABOUT THIS DIET?  Eat a low-fat diet.  Reduce your fat intake to less than 20-30% of your total daily calories. This is less than 50-60 g of fat per day.  Remember that you need some fat in your diet. Ask your dietician what your daily goal should be.  Choose nonfat and low-fat healthy foods. Look for the words "nonfat," "low fat," or "fat free."  As a guide, look on the label and choose foods with less than 3 g of fat per serving. Eat only one serving.  Avoid alcohol.  Do not smoke. If you need help quitting, talk with your health care provider.  Eat small frequent meals instead of three large heavy meals. WHAT FOODS CAN I EAT? Grains Include healthy grains and starches such as potatoes, wheat bread, fiber-rich cereal, and brown rice. Choose whole grain options whenever possible. In adults, whole grains should account for 45-65% of your daily calories.  Fruits and Vegetables Eat plenty of fruits and vegetables. Fresh fruits and vegetables add fiber to your diet. Meats and Other Protein Sources Eat lean meat such as chicken and pork. Trim any fat off of meat before cooking it. Eggs, fish, and beans are other sources of protein. In adults, these foods should account for 10-35% of your daily calories. Dairy Choose low-fat  milk and dairy options. Dairy includes fat and protein, as well as calcium.  Fats and Oils Limit high-fat foods such as fried foods, sweets, baked goods, sugary drinks.  Other Creamy sauces and condiments, such as mayonnaise, can add extra fat. Think about whether or not you need to use them, or use smaller amounts or low fat options. WHAT FOODS ARE NOT RECOMMENDED?  High fat foods, such as:  [employer].  Ice cream.  [place] toast.  Sweet rolls.  Pizza.  Cheese bread.  Foods covered with batter, butter, creamy sauces, or cheese.  Fried foods.  Sugary drinks and desserts.  Foods that cause gas or bloating   This information is not intended to replace advice given to you by your health care provider. Make sure you discuss any questions you have with your health care provider.   Document Released: 08/03/2013 Document Reviewed: 08/03/2013 Elsevier Interactive Patient Education [employer].

## 2016-05-08 NOTE — Telephone Encounter (Signed)
Unable to reach patient. Will continue to follow up with transitional care management.  Need to schedule hospital follow up with PCP.  Original scheduled appointment date cancelled.

## 2016-05-08 NOTE — Progress Notes (Signed)
[facility] Progress Note     Subjective: Sitting up, smiling, "i feel better today!". Denies any chills, nausea, or abdominal pain. Small BM this AM. Passing a lot of flatus. attributes pain, bloating, and nausea yesterday to eating toast. Tolerated some pears for breakfast. About to go for a walk with the nurse.   Temp 100.0 yesterday, no fevers overnight. Objective: Vital signs in last 24 hours: Temp:  [98.4 F (36.9 C)-100 F (37.8 C)] 98.8 F (37.1 C) (09/27 0643) Pulse Rate:  [82-97] 82 (09/27 0643) Resp:  [17-18] 17 (09/27 0643) BP: (142-171)/(60-73) 142/60 (09/27 0643) SpO2:  [90 %-92 %] 90 % (09/27 0643) Last BM Date: 05/07/16  Intake/Output from previous day: 09/26 0701 - 09/27 0700 In: 2910 [P.O.:610; I.V.:2300] Out: -  Intake/Output this shift: Total I/O In: 291.7 [I.V.:291.7] Out: -   PE: Gen:  Alert, NAD, pleasant Card:  RRR, no M/G/R Pulm:  CTA, no W/R/R, nonlabored Abd: Soft, NT/ND, +BS, incisions C/D/I  Lab Results:   Recent Labs  05/07/16 0517 05/08/16 0416  WBC 7.4 7.1  HGB 11.7* 11.9*  HCT 36.5 38.1  PLT 385 406*   BMET  Recent Labs  05/07/16 0517  NA 137  K 3.5  CL 102  CO2 29  GLUCOSE 117*  BUN <5*  CREATININE 0.72  CALCIUM 8.6*   CMP     Component Value Date/Time   NA 137 05/07/2016 0517   NA 140 06/13/2014 1102   K 3.5 05/07/2016 0517   CL 102 05/07/2016 0517   CO2 29 05/07/2016 0517   GLUCOSE 117 (H) 05/07/2016 0517   BUN <5 (L) 05/07/2016 0517   BUN 10 06/13/2014 1102   CREATININE 0.72 05/07/2016 0517   CALCIUM 8.6 (L) 05/07/2016 0517   PROT 5.3 (L) 05/05/2016 0619   PROT 6.5 06/13/2014 1102   ALBUMIN 2.4 (L) 05/05/2016 0619   ALBUMIN 4.2 06/13/2014 1102   AST 22 05/05/2016 0619   ALT 23 05/05/2016 0619   ALKPHOS 78 05/05/2016 0619   BILITOT 0.8 05/05/2016 0619   GFRNONAA >60 05/07/2016 0517   GFRAA >60 05/07/2016 0517   Lipase     Component Value Date/Time   LIPASE 39 05/08/2016 0416     Assessment/Plan Abdominal pain S/p lap chole 04/30/2016  - CT 9/24 showed fluid collection gallbladder fossa 6.5x4.1cm - HIDA 9/24 shows no evidence of biloma or biliary obstruction - WBC WNL (7.1), lipase WNL (39), afebrile  Constipation - + BMs - continue stool softeners and encouraged PO fluids  Hypertension Hypoxia - seen by pulmonology who recommends continued mobilization & ambulatory O2 saturation prior to discharge, as well as repeat CT chest without contrast as outpatient to ensure resolution of bilateral pleural effusions  FEN - soft, IVF VTE - lovenox  Plan - mobilize, eat lunch, likely discharge home today if continues to improve. F/U Dr. Grandville Silos in 1 week F/U PCP in 1-2 weeks F/U chest CT to confirm resolution of BL pleural effusion    LOS: 3 days    Jill Alexanders , [facility] Surgery 05/08/2016, 9:21 AM Pager: [phone] Consults: [phone] Mon-Fri 7:00 am-4:30 pm Sat-Sun 7:00 am-11:30 am

## 2016-05-09 ENCOUNTER — Telehealth: Payer: Self-pay

## 2016-05-09 NOTE — Discharge Summary (Signed)
[facility] Discharge Summary   Patient ID: Crystal Kidd MRN: [id number] DOB/AGE: 08/29/1945 70 y.o.  Admit date: 05/04/2016 Discharge date: 05/09/2016  Admitting Diagnosis: S/p cholecystectomy Constipation  Discharge Diagnosis Patient Active Problem List   Diagnosis Date Noted  . Post-op pain 05/05/2016  . Acute cholecystitis 04/29/2016  . Ileus ([place]) 01/02/2016  . UTI (urinary tract infection) 01/02/2016  . Thoracic aortic atherosclerosis ([place]) 01/02/2016  . Swelling of right upper extremity 12/02/2015  . Trapezius muscle spasm 04/13/2015  . Stye 04/13/2015  . Medicare annual wellness visit, subsequent 06/14/2014  . Pain in joint, shoulder region 12/06/2013  . S/P hysterectomy with oophorectomy 12/06/2013  . Cataract 12/06/2013  . Transfusion history   . Abnormal CT scan of lung 08/24/2012  . New onset of headaches after age 28 08/22/2011  . Cervical disc disorder with radiculopathy of cervical region 05/21/2010  . OTHER DYSPHAGIA 05/31/2009  . VITAMIN D DEFICIENCY 05/16/2009  . Hypothyroidism 12/26/2007  . Hyperlipidemia LDL goal <100 12/26/2007  . Essential hypertension 12/26/2007  . GERD 12/26/2007  . Diverticulosis of large intestine 12/26/2007  . VITILIGO 12/31/2006  . OSTEOPENIA 12/31/2006    Consultants None  Imaging: CT angio chest PE W and/or Wo contrast 05/05/16: 1. No evidence of significant pulmonary embolus. 2. Small bilateral pleural effusions, right greater than left. Underlying interstitial prominence likely reflects mild pulmonary edema. 3. Mildly complex collection of fluid at the gallbladder fossa, measuring 6.5 x 4.1 cm, with minimal air. Trace fluid tracks along the duodenum and into the pelvis. This is somewhat more than expected status post cholecystectomy. A bile leak cannot be excluded. Depending on the patient's symptoms, would consider HIDA scan for further evaluation.  CT abdomen pelvis w contrast 05/05/16: 1. No evidence  of significant pulmonary embolus. 2. Small bilateral pleural effusions, right greater than left. Underlying interstitial prominence likely reflects mild pulmonary edema. 3. Mildly complex collection of fluid at the gallbladder fossa, measuring 6.5 x 4.1 cm, with minimal air. Trace fluid tracks along the duodenum and into the pelvis. This is somewhat more than expected status post cholecystectomy. A bile leak cannot be excluded. Depending on the patient's symptoms, would consider HIDA scan for further evaluation.  NH Hepatobiliary including GB 05/05/16: No evidence of biloma or biliary obstruction.  Procedures' None this admission Dr. Grandville Silos (04/30/16) - Laparoscopic Cholecystectomy  Hospital Course:  Crystal Kidd is a 70yo who underwent lap chole 04/30/2016 for acute calculous cholecystitis.  Cholangiogram was not able to be performed due to the presence of a short cystic duct. She [facility] to [facility]04[facility]16 with upper abdominal pain that started the day prior, as well as nausea, bloating, and no BM x8 days. She was found to be hypoxic on arrival. CT angio was negative for PE. CT abdomen showed more than expected postop fluid, but her WBC was WNL and she was afebrile. HIDA scan was performed and showed no evidence of biloma or biliary obstruction. She was treated for her constipation with miralax, stool softeners, and an enema. Her abdominal pain improved after the first BM, but the next day her pain again worsened. This occurred after eating a piece of toast, but she also spiked a temperature of 100 and was complaining of chills. The following day she had an additional BM, passed a significant amount of flatus, was tolerating diet, and felt ready to go home. Her WBC was normal and she was afebrile. Pulmonology was also consulted during this admission and recommended continued mobilization and  ambulatory O2 saturation prior to discharge, as well as repeat CT chest without contrast as outpatient to ensure  resolution of bilateral pleural effusions. Patient stated that her shortness of breath improved with ambulation and incentive spirometer use. She will make a follow-up with her primary care physician in 1-2 weeks for reevaluation. She has a follow-up with Dr. Grandville Silos in 1 week.   Physical Exam: Gen:  Alert, NAD, pleasant Card:  RRR, no M/G/R Pulm:  CTA, no W/R/R, nonlabored Abd: Soft, NT/ND, +BS, incisions C/D/I    Medication List    TAKE these medications   acetaminophen 500 MG tablet Commonly known as:  TYLENOL Take 500 mg by mouth every 6 (six) hours as needed for headache (pain).   AZO TABS PO Take 2 tablets by mouth daily as needed (urinary pain).   baclofen 10 MG tablet Commonly known as:  LIORESAL Take 0.5 tablets (5 mg total) by mouth 3 (three) times daily as needed for muscle spasms.   cholecalciferol 1000 units tablet Commonly known as:  VITAMIN D Take 1,000 Units by mouth daily.   diphenhydrAMINE 25 MG tablet Commonly known as:  BENADRYL Take 25 mg by mouth at bedtime as needed for allergies.   FIBER SELECT GUMMIES PO Take 1 tablet by mouth daily as needed (constipation).   fluticasone 50 MCG/ACT nasal spray Commonly known as:  FLONASE USE 2 SPRAYS IN EACH NOSTRIL EVERY DAY What changed:  See the new instructions.   gabapentin 100 MG capsule Commonly known as:  NEURONTIN TAKE 1 CAPSULE (100 MG TOTAL) BY MOUTH 3 (THREE) TIMES DAILY. EVERY 8 HOURS AS NEEDED What changed:  See the new instructions.   GAS-X PO Take 3 strips by mouth daily as needed (indigestion).   hydrochlorothiazide 25 MG tablet Commonly known as:  HYDRODIURIL TAKE 1/2 TABLET DAILY AS NEEDED What changed:  See the new instructions.   ibuprofen 200 MG tablet Commonly known as:  ADVIL,MOTRIN Take 200 mg by mouth every 6 (six) hours as needed for moderate pain.   levothyroxine 50 MCG tablet Commonly known as:  SYNTHROID, LEVOTHROID TAKE 1 TABLET DAILY EXCEPT TAKE 1 AND 1/2 TABLETS  (75MCG) MONDAY, WEDNESDAY AND FRIDAY AS DIRECTED What changed:  how much to take  how to take this  when to take this  additional instructions   loratadine 10 MG tablet Commonly known as:  CLARITIN Take 10 mg by mouth daily.   losartan 50 MG tablet Commonly known as:  COZAAR TAKE 1 AND 1/2 TABLETS EVERY DAY What changed:  how much to take  how to take this  when to take this  additional instructions   omeprazole 40 MG capsule Commonly known as:  PRILOSEC TAKE 1 CAPSULE EVERY DAY   ondansetron 4 MG tablet Commonly known as:  ZOFRAN Take 1 tablet (4 mg total) by mouth every 8 (eight) hours as needed for nausea.   polyethylene glycol packet Commonly known as:  MIRALAX / GLYCOLAX Take 17 g by mouth 2 (two) times daily.   promethazine 12.5 MG tablet Commonly known as:  PHENERGAN Take 1 tablet (12.5 mg total) by mouth every 8 (eight) hours as needed for nausea or vomiting.   senna 8.6 MG Tabs tablet Commonly known as:  SENOKOT Take 1 tablet (8.6 mg total) by mouth daily as needed for mild constipation.   simvastatin 20 MG tablet Commonly known as:  ZOCOR TAKE 1 TABLET EVERY EVENING   SYSTANE OP Place 1 drop into both eyes 4 (four) times  daily as needed (dry eyes).   TUMS ULTRA 1000 PO Take 1,000 mg by mouth See admin instructions. Take 1 tablet (1000 mg) by mouth daily, may take an additional tablet as needed for indigestion        Follow-up Information    [facility].   Specialty:  Emergency Medicine Why:  If symptoms worsen Contact information: [id number] [place]       [name], MD. Schedule an appointment as soon as possible for a visit in 1 week(s).   Specialty:  Internal Medicine Why:  1-2 weeks. for follow up of your low oxygen saturations. Possible referral for repeat CT schan of chest. Contact information: [place]  [place] [address] [phone]        Zenovia Jarred, MD. Go in 1 week(s).   Specialty:  General Surgery Why:  call to confirm appointment date/time. Contact information: [address] [place] [address] [phone]           Signed: Jerrye Beavers, [facility] 05/09/2016, 12:50 PM Pager: [phone] Consults: [phone] Mon-Fri 7:00 am-4:30 pm Sat-Sun 7:00 am-11:30 am

## 2016-05-09 NOTE — Telephone Encounter (Signed)
Please call pt at [phone]

## 2016-05-09 NOTE — Telephone Encounter (Signed)
Transition Care Management Follow-up Telephone Call   Date discharged? 05/08/16   How have you been since you were released from the hospital? [place]. NO DIZZINESS, NAUSEA.  BM IS FREQUENT AND LOOSE.  EATING YOGURT AND TAKING PROBIOTIC.  DRINKING PLENTY OF FLUIDS. NO ABD PAIN.  NO FEVER/CHILLS. SOB IS IMPROVED WITH AMBULATION AND INCENTIVE SPIROMETER USE.  Encouraged to return to the ED if fever spikes.  She verbalized understanding and will comply.   Do you understand why you were in the hospital? YES, READMITTED [place].   Do you understand the discharge instructions? YES, FOLLOW UP WITH PCP IN 1 WEEK, DRINK PLENTY OF FLUIDS, CONTINUE TAKING MEDICATION TO PREVENT CONSTIPATION, INCREASE ACTIVITY AS TOLERATED, REPEAT CT SCAN OF CHEST WITHOUT CONTRAST TO ENSURE RESOLUTION OF BILATERAL PLEURAL EFFUSIONS.   Where were you discharged to? Home.   Items Reviewed:  Medications reviewed: YES, TAKING ALL SCHEDULED MEDICATIONS AS DIRECTED.  Allergies reviewed: YES, ALENDRONATE, DILAUDID, IBANDRONATE SODIUM, BESIVANCE.  Dietary changes reviewed: YES, LOW FAT/ SOFT DIET.  INCREASE AS TOLERATED.  Referrals reviewed: YES, APPOINTMENT SCHEDULED WITH GENERAL SURGERY AND PCP.   Functional Questionnaire:   Activities of Daily Living (ADLs):   She states they are independent in the following: Independent in all ADLs. States they require assistance with the following: Does not require assistance at this time.   Any transportation issues/concerns?: NO.   Any patient concerns? NONE.   Confirmed importance and date/time of follow-up visits scheduled YES, appointment scheduled 05/14/16 at 1:00.  Provider Appointment booked with Dr. Derrel Nip (PCP).  Confirmed with patient if condition begins to worsen call PCP or go to the ER.  Patient was given the office number and encouraged to call back with question or concerns.  : YES, PATIENT  VERBALIZED UNDERSTANDING.

## 2016-05-10 NOTE — Telephone Encounter (Signed)
Noted.  Was there something else she needed or was this just sent as FYI.  Thanks

## 2016-05-10 NOTE — Telephone Encounter (Signed)
FYI

## 2016-05-14 ENCOUNTER — Ambulatory Visit (INDEPENDENT_AMBULATORY_CARE_PROVIDER_SITE_OTHER): Payer: Commercial Managed Care - HMO | Admitting: Internal Medicine

## 2016-05-14 ENCOUNTER — Encounter: Payer: Self-pay | Admitting: Internal Medicine

## 2016-05-14 ENCOUNTER — Ambulatory Visit (INDEPENDENT_AMBULATORY_CARE_PROVIDER_SITE_OTHER): Payer: Commercial Managed Care - HMO

## 2016-05-14 VITALS — BP 120/72 | HR 98 | Temp 98.0°F | Resp 12 | Ht 63.0 in | Wt 127.8 lb

## 2016-05-14 DIAGNOSIS — Z09 Encounter for follow-up examination after completed treatment for conditions other than malignant neoplasm: Secondary | ICD-10-CM

## 2016-05-14 DIAGNOSIS — R634 Abnormal weight loss: Secondary | ICD-10-CM | POA: Diagnosis not present

## 2016-05-14 DIAGNOSIS — J90 Pleural effusion, not elsewhere classified: Secondary | ICD-10-CM

## 2016-05-14 DIAGNOSIS — J9601 Acute respiratory failure with hypoxia: Secondary | ICD-10-CM

## 2016-05-14 DIAGNOSIS — Z5189 Encounter for other specified aftercare: Secondary | ICD-10-CM

## 2016-05-14 DIAGNOSIS — K810 Acute cholecystitis: Secondary | ICD-10-CM | POA: Diagnosis not present

## 2016-05-14 LAB — COMPREHENSIVE METABOLIC PANEL
ALK PHOS: 217 U/L — AB (ref 39–117)
ALT: 35 U/L (ref 0–35)
AST: 23 U/L (ref 0–37)
Albumin: 3.5 g/dL (ref 3.5–5.2)
BILIRUBIN TOTAL: 0.5 mg/dL (ref 0.2–1.2)
BUN: 9 mg/dL (ref 6–23)
CO2: 32 mEq/L (ref 19–32)
CREATININE: 0.75 mg/dL (ref 0.40–1.20)
Calcium: 9 mg/dL (ref 8.4–10.5)
Chloride: 99 mEq/L (ref 96–112)
GFR: 80.99 mL/min (ref >60.00)
GLUCOSE: 98 mg/dL (ref 70–99)
Potassium: 4.3 mEq/L (ref 3.5–5.1)
SODIUM: 138 meq/L (ref 135–145)
TOTAL PROTEIN: 7.2 g/dL (ref 6.0–8.3)

## 2016-05-14 NOTE — Progress Notes (Signed)
Pre-visit discussion using our clinic review tool. No additional management support is needed unless otherwise documented below in the visit note.  

## 2016-05-14 NOTE — Patient Instructions (Addendum)
You have lost 14 lbs.  We need to get more protein and calories in you!   You might want to try a premixed protein drink called Premier Protein shake for breakfast or late night snack . It is great tasting,   very low sugar and available of < $2 serving at [facility] and  In bulk for $1.50/serving at [employer] and [employer]  .    Nutritional analysis :  160 cal  30 g protein  1 g sugar 50% calcium needs   [employer] and [employer]  Bananas n cream  Strawberries  n cream,  Chocolate, vanilla and caramel.   The alternative is Boost or Ensure , which have twice the calories and sugars  but less protein  than Premier Protein   You can drink up to two Premier Protein and one of the Boost/Ensure  daily when you don't feel like eating.    Sip throughout the day if necessary

## 2016-05-14 NOTE — Progress Notes (Signed)
Subjective:  Patient ID: Crystal Kidd, female    DOB: 10/18/1945  Age: 70 y.o. MRN: [id number]  CC: The primary encounter diagnosis was Pleural effusion. Diagnoses of Loss of weight, Acute respiratory failure with hypoxia ([place]), Acute cholecystitis, and Hospital discharge follow-up were also pertinent to this visit.  HPI Crystal Kidd presents for hospital follow up.  Admitted  To Gen Surg service sept 18 with acute on chronic cholecystitis.  Underwent lap chole on 9/19.  Noted postoperatively to develop hypoxia requiring supplemental oxygen  .  Was discharged home still hypoxic with ambulation but satting  94% on room air,  No supplemental 02 or incentive spirometry was advised.  She was discharged home on Sept 21  And advised to follo wup with PVP for respiratory failure.  Declined urgent workup by me during transitional care call.  Readmitted Sept 21 with acute hypoxic respiratory failure accompanied by fever, nausea and cough, hypoxia and abdominal distension with constipation lasting 8 days (priro to   Received a Pulmonary consult for hypoxia,  chest CT was negative for Pe,  But increased fluid in GB fossa). HIDA scan neg for biliary leak    Given lasix incentive spirometry  And  repeat CT at follow up was  recommended.  Pulse ox was 93% at discharge and she felt better. Had significant fecal output while in house.  Discharged on Sept 28th.   patient feels she was sent home too early, but was told she was fine.    Currently feeling better,  But still gets full very quickly feels nauseated if she overeats.  Moving bowels now with the help of Senna bid. Eating vegetables, yogurt,  Added chicken yesterday and eggs scrambled  Has been taking 1/2 hctz daily since discharge.  Has lost 2 lbs since discharge.  tolerated a walk outside yesterday for 10 minutes without feeling short of breath,  Slower. Than usual  No orthopnea, but has occasional episoes of sudden breathlessness. Without hest pain .    Wt loss of 14 lbs noted, 2 since leaving the hospital on 9/28 due to poor appetite.  Some leg cramps but not severe not the same place every time       Outpatient Medications Prior to Visit  Medication Sig Dispense Refill  . acetaminophen (TYLENOL) 500 MG tablet Take 500 mg by mouth every 6 (six) hours as needed for headache (pain).    . baclofen (LIORESAL) 10 MG tablet Take 0.5 tablets (5 mg total) by mouth 3 (three) times daily as needed for muscle spasms. 30 each 0  . Calcium Carbonate Antacid (TUMS ULTRA 1000 PO) Take 1,000 mg by mouth See admin instructions. Take 1 tablet (1000 mg) by mouth daily, may take an additional tablet as needed for indigestion    . cholecalciferol (VITAMIN D) 1000 units tablet Take 1,000 Units by mouth daily.    . diphenhydrAMINE (BENADRYL) 25 MG tablet Take 25 mg by mouth at bedtime as needed for allergies.    Marland Kitchen FIBER SELECT GUMMIES PO Take 1 tablet by mouth daily as needed (constipation).     . fluticasone (FLONASE) 50 MCG/ACT nasal spray USE 2 SPRAYS IN EACH NOSTRIL EVERY DAY (Patient taking differently: USE 2 SPRAYS IN EACH NOSTRIL DAILY AS NEEDED FOR CONGESTION) 48 g 6  . gabapentin (NEURONTIN) 100 MG capsule TAKE 1 CAPSULE (100 MG TOTAL) BY MOUTH 3 (THREE) TIMES DAILY. EVERY 8 HOURS AS NEEDED (Patient taking differently: TAKE 1 CAPSULE (100 MG TOTAL) BY MOUTH  EVERY 8 HOURS AS NEEDED FOR NECK PAIN) 90 capsule 3  . hydrochlorothiazide (HYDRODIURIL) 25 MG tablet TAKE 1/2 TABLET DAILY AS NEEDED (Patient taking differently: TAKE 1/2 TABLET DAILY AS NEEDED FOR SWELLING OR SBP >150) 45 tablet 1  . ibuprofen (ADVIL,MOTRIN) 200 MG tablet Take 200 mg by mouth every 6 (six) hours as needed for moderate pain.     Marland Kitchen levothyroxine (SYNTHROID, LEVOTHROID) 50 MCG tablet TAKE 1 TABLET DAILY EXCEPT TAKE 1 AND 1/2 TABLETS (75MCG) MONDAY, WEDNESDAY AND FRIDAY AS DIRECTED (Patient taking differently: Take 50-75 mcg by mouth See admin instructions. Take 1 tablet (50 mcg) by mouth  daily before breakfast on Sunday, Tuesday, Thursday, Saturday, Take 1 1/2 tablet (75 mcg) before breakfast on Monday, Wednesday, Friday.) 108 tablet 2  . loratadine (CLARITIN) 10 MG tablet Take 10 mg by mouth daily.    Marland Kitchen losartan (COZAAR) 50 MG tablet TAKE 1 AND 1/2 TABLETS EVERY DAY (Patient taking differently: Take 75 mg by mouth daily. ) 135 tablet 1  . omeprazole (PRILOSEC) 40 MG capsule TAKE 1 CAPSULE EVERY DAY 90 capsule 3  . ondansetron (ZOFRAN) 4 MG tablet Take 1 tablet (4 mg total) by mouth every 8 (eight) hours as needed for nausea. 10 tablet 5  . Phenazopyridine HCl (AZO TABS PO) Take 2 tablets by mouth daily as needed (urinary pain).    Vladimir Faster Glycol-Propyl Glycol (SYSTANE OP) Place 1 drop into both eyes 4 (four) times daily as needed (dry eyes).    . polyethylene glycol (MIRALAX / GLYCOLAX) packet Take 17 g by mouth 2 (two) times daily. 14 each 0  . promethazine (PHENERGAN) 12.5 MG tablet Take 1 tablet (12.5 mg total) by mouth every 8 (eight) hours as needed for nausea or vomiting. 20 tablet 0  . senna (SENOKOT) 8.6 MG TABS tablet Take 1 tablet (8.6 mg total) by mouth daily as needed for mild constipation. 120 each 0  . Simethicone (GAS-X PO) Take 3 strips by mouth daily as needed (indigestion).    . simvastatin (ZOCOR) 20 MG tablet TAKE 1 TABLET EVERY EVENING 90 tablet 1   No facility-administered medications prior to visit.     Review of Systems;  Patient denies headache, fevers, malaise, unintentional weight loss, skin rash, eye pain, sinus congestion and sinus pain, sore throat, dysphagia,  hemoptysis , cough, dyspnea, wheezing, chest pain, palpitations, orthopnea, edema, abdominal pain, nausea, melena, diarrhea, constipation, flank pain, dysuria, hematuria, urinary  Frequency, nocturia, numbness, tingling, seizures,  Focal weakness, Loss of consciousness,  Tremor, insomnia, depression, anxiety, and suicidal ideation.      Objective:  BP 120/72   Pulse 98   Temp 98 F  (36.7 C) (Oral)   Resp 12   Ht 5\' 3"  (1.6 m)   Wt 127 lb 12 oz (57.9 kg)   SpO2 97%   BMI 22.63 kg/m   BP Readings from Last 3 Encounters:  05/14/16 120/72  05/08/16 (!) 142/60  05/02/16 129/69    Wt Readings from Last 3 Encounters:  05/14/16 127 lb 12 oz (57.9 kg)  05/05/16 136 lb 14.4 oz (62.1 kg)  04/30/16 139 lb (63 kg)    General appearance: alert, cooperative and appears stated age Ears: normal TM's and external ear canals both ears Throat: lips, mucosa, and tongue normal; teeth and gums normal Neck: no adenopathy, no carotid bruit, supple, symmetrical, trachea midline and thyroid not enlarged, symmetric, no tenderness/mass/nodules Back: symmetric, no curvature. ROM normal. No CVA tenderness. Lungs: clear to auscultation bilaterally  Heart: regular rate and rhythm, S1, S2 normal, no murmur, click, rub or gallop Abdomen: soft, non-tender; bowel sounds normal; no masses,  no organomegaly Pulses: 2+ and symmetric Skin: Skin color, texture, turgor normal. No rashes or lesions Lymph nodes: Cervical, supraclavicular, and axillary nodes normal.  Lab Results  Component Value Date   HGBA1C 5.6 08/25/2012   HGBA1C 5.6 05/05/2008   HGBA1C 5.7 01/07/2007    Lab Results  Component Value Date   CREATININE 0.75 05/14/2016   CREATININE 0.72 05/07/2016   CREATININE 0.60 05/05/2016    Lab Results  Component Value Date   WBC 7.1 05/08/2016   HGB 11.9 (L) 05/08/2016   HCT 38.1 05/08/2016   PLT 406 (H) 05/08/2016   GLUCOSE 98 05/14/2016   CHOL 141 11/30/2015   TRIG 125.0 11/30/2015   HDL 44.20 11/30/2015   LDLDIRECT 79.0 11/30/2015   LDLCALC 72 11/30/2015   ALT 35 05/14/2016   AST 23 05/14/2016   NA 138 05/14/2016   K 4.3 05/14/2016   CL 99 05/14/2016   CREATININE 0.75 05/14/2016   BUN 9 05/14/2016   CO2 32 05/14/2016   TSH 3.44 11/30/2015   HGBA1C 5.6 08/25/2012    Dg Chest 2 View  Result Date: 05/04/2016 CLINICAL DATA:  Nausea and fever. EXAM: CHEST  2 VIEW  COMPARISON:  May 01, 2016 FINDINGS: No pneumothorax. The cardiomediastinal silhouette is stable. Suspected small bilateral effusions and bibasilar atelectasis. No convincing evidence of pneumonia. No other interval changes. IMPRESSION: Suspected small effusions and underlying atelectasis. No convincing evidence of pneumonia. Electronically Signed   By: Dorise Bullion III M.D   On: 05/04/2016 21:03   Ct Angio Chest Pe W And/or Wo Contrast  Result Date: 05/05/2016 CLINICAL DATA:  Status post laparoscopic cholecystectomy. Acute onset of low grade fever and hypoxia. Generalized abdominal pain and nausea. Initial encounter. EXAM: CT ANGIOGRAPHY CHEST CT ABDOMEN AND PELVIS WITH CONTRAST TECHNIQUE: Multidetector CT imaging of the chest was performed using the standard protocol during bolus administration of intravenous contrast. Multiplanar CT image reconstructions and MIPs were obtained to evaluate the vascular anatomy. Multidetector CT imaging of the abdomen and pelvis was performed using the standard protocol during bolus administration of intravenous contrast. CONTRAST:  100 mL of Isovue 370 IV contrast COMPARISON:  Abdominal ultrasound performed 04/29/2016, and CT of the chest, abdomen and pelvis performed 05/09/2011 FINDINGS: CTA CHEST FINDINGS Cardiovascular: There is no evidence of significant pulmonary embolus. The heart is unremarkable in appearance. Mild calcification is noted at the aortic arch. The great vessels are grossly unremarkable in appearance. Mediastinum/Nodes: No mediastinal lymphadenopathy is seen. No pericardial effusion is identified. The visualized portions of the thyroid gland are unremarkable. No axillary lymphadenopathy is seen. Lungs/Pleura: Small bilateral pleural effusions are noted, right greater than left. Underlying interstitial prominence likely reflects mild pulmonary edema. No pneumothorax is seen. No masses are identified. Musculoskeletal: No acute osseous abnormalities  are identified. The visualized musculature is unremarkable in appearance. Review of the MIP images confirms the above findings. CT ABDOMEN and PELVIS FINDINGS Hepatobiliary: A mildly complex collection of fluid is noted at the gallbladder fossa, measuring 6.5 x 4.1 cm, with minimal air. Trace fluid tracks along the duodenum and into the pelvis. A bile leak cannot be excluded. The liver is unremarkable. Scattered clips are noted at the gallbladder fossa. The common bile duct remains normal in caliber. Pancreas: The pancreas is within normal limits. Spleen: The spleen is unremarkable in appearance. Adrenals/Urinary Tract: The adrenal glands are unremarkable  in appearance. The kidneys are within normal limits. There is no evidence of hydronephrosis. No renal or ureteral stones are identified. Mild right-sided perinephric fluid is noted. Stomach/Bowel: The stomach is unremarkable in appearance. The small bowel is within normal limits. The patient is status post appendectomy. The colon is unremarkable in appearance. Vascular/Lymphatic: The abdominal aorta is unremarkable in appearance. The inferior vena cava is grossly unremarkable. No retroperitoneal lymphadenopathy is seen. No pelvic sidewall lymphadenopathy is identified. Reproductive: The patient is status post hysterectomy. No suspicious adnexal masses are seen. The bladder is moderately distended. A likely calcified epiploic appendage is noted within the fluid in the pelvis. Other: No additional soft tissue abnormalities are seen. Musculoskeletal: No acute osseous abnormalities are identified. The visualized musculature is unremarkable in appearance. Review of the MIP images confirms the above findings. IMPRESSION: 1. No evidence of significant pulmonary embolus. 2. Small bilateral pleural effusions, right greater than left. Underlying interstitial prominence likely reflects mild pulmonary edema. 3. Mildly complex collection of fluid at the gallbladder fossa,  measuring 6.5 x 4.1 cm, with minimal air. Trace fluid tracks along the duodenum and into the pelvis. This is somewhat more than expected status post cholecystectomy. A bile leak cannot be excluded. Depending on the patient's symptoms, would consider HIDA scan for further evaluation. Electronically Signed   By: Garald Balding M.D.   On: 05/05/2016 02:12   Nm Hepatobiliary Including Gb  Result Date: 05/05/2016 CLINICAL DATA:  Cholecystectomy 5 days ago. Nausea. Lack of bowel movements. Back pain. Right upper quadrant fluid collection on CT. EXAM: NUCLEAR MEDICINE HEPATOBILIARY IMAGING TECHNIQUE: Sequential images of the abdomen were obtained out to 60 minutes following intravenous administration of radiopharmaceutical. RADIOPHARMACEUTICALS:  5.5 MCi Tc-32m  Choletec IV COMPARISON:  CT of earlier today. FINDINGS: Prompt uptake of tracer by the liver. There is early medial porta hepatis activity which is all felt to be in the common duct. The postoperative fluid collection is positioned more laterally, when correlated with CT. This does not persist, arguing against biloma. Activity within the bowel at 15 minutes excludes biliary obstruction. IMPRESSION: No evidence of biloma or biliary obstruction. Electronically Signed   By: Abigail Miyamoto M.D.   On: 05/05/2016 12:02   Ct Abdomen Pelvis W Contrast  Result Date: 05/05/2016 CLINICAL DATA:  Status post laparoscopic cholecystectomy. Acute onset of low grade fever and hypoxia. Generalized abdominal pain and nausea. Initial encounter. EXAM: CT ANGIOGRAPHY CHEST CT ABDOMEN AND PELVIS WITH CONTRAST TECHNIQUE: Multidetector CT imaging of the chest was performed using the standard protocol during bolus administration of intravenous contrast. Multiplanar CT image reconstructions and MIPs were obtained to evaluate the vascular anatomy. Multidetector CT imaging of the abdomen and pelvis was performed using the standard protocol during bolus administration of intravenous  contrast. CONTRAST:  100 mL of Isovue 370 IV contrast COMPARISON:  Abdominal ultrasound performed 04/29/2016, and CT of the chest, abdomen and pelvis performed 05/09/2011 FINDINGS: CTA CHEST FINDINGS Cardiovascular: There is no evidence of significant pulmonary embolus. The heart is unremarkable in appearance. Mild calcification is noted at the aortic arch. The great vessels are grossly unremarkable in appearance. Mediastinum/Nodes: No mediastinal lymphadenopathy is seen. No pericardial effusion is identified. The visualized portions of the thyroid gland are unremarkable. No axillary lymphadenopathy is seen. Lungs/Pleura: Small bilateral pleural effusions are noted, right greater than left. Underlying interstitial prominence likely reflects mild pulmonary edema. No pneumothorax is seen. No masses are identified. Musculoskeletal: No acute osseous abnormalities are identified. The visualized musculature is unremarkable in  appearance. Review of the MIP images confirms the above findings. CT ABDOMEN and PELVIS FINDINGS Hepatobiliary: A mildly complex collection of fluid is noted at the gallbladder fossa, measuring 6.5 x 4.1 cm, with minimal air. Trace fluid tracks along the duodenum and into the pelvis. A bile leak cannot be excluded. The liver is unremarkable. Scattered clips are noted at the gallbladder fossa. The common bile duct remains normal in caliber. Pancreas: The pancreas is within normal limits. Spleen: The spleen is unremarkable in appearance. Adrenals/Urinary Tract: The adrenal glands are unremarkable in appearance. The kidneys are within normal limits. There is no evidence of hydronephrosis. No renal or ureteral stones are identified. Mild right-sided perinephric fluid is noted. Stomach/Bowel: The stomach is unremarkable in appearance. The small bowel is within normal limits. The patient is status post appendectomy. The colon is unremarkable in appearance. Vascular/Lymphatic: The abdominal aorta is  unremarkable in appearance. The inferior vena cava is grossly unremarkable. No retroperitoneal lymphadenopathy is seen. No pelvic sidewall lymphadenopathy is identified. Reproductive: The patient is status post hysterectomy. No suspicious adnexal masses are seen. The bladder is moderately distended. A likely calcified epiploic appendage is noted within the fluid in the pelvis. Other: No additional soft tissue abnormalities are seen. Musculoskeletal: No acute osseous abnormalities are identified. The visualized musculature is unremarkable in appearance. Review of the MIP images confirms the above findings. IMPRESSION: 1. No evidence of significant pulmonary embolus. 2. Small bilateral pleural effusions, right greater than left. Underlying interstitial prominence likely reflects mild pulmonary edema. 3. Mildly complex collection of fluid at the gallbladder fossa, measuring 6.5 x 4.1 cm, with minimal air. Trace fluid tracks along the duodenum and into the pelvis. This is somewhat more than expected status post cholecystectomy. A bile leak cannot be excluded. Depending on the patient's symptoms, would consider HIDA scan for further evaluation. Electronically Signed   By: Garald Balding M.D.   On: 05/05/2016 02:12   Dg Abd 2 Views  Result Date: 05/04/2016 CLINICAL DATA:  Fever with nausea. EXAM: ABDOMEN - 2 VIEW COMPARISON:  None. FINDINGS: Small effusions with bibasilar atelectasis suspected. No free air, portal venous gas, or pneumatosis. No evidence of bowel obstruction. IMPRESSION: No abnormality seen within the abdomen. Electronically Signed   By: Dorise Bullion III M.D   On: 05/04/2016 21:03    Assessment & Plan:   Problem List Items Addressed This Visit    Acute cholecystitis    S/p lap chole sept 19th       Acute respiratory failure ([place])    Secondary to bilateral pleural effusions noted on chest CT.  treated with lasix and incentive spirometry.  Repeat chest x ray is now normal      Hospital  discharge follow-up    Patient is stable post discharge and has no new issues or questions about her discharge plans. Patient is stable post discharge and has no new issues or questions about discharge plans at the visit today for hospital follow up.  I have reviewed the records from the hospital admission in detail with patient today.       Other Visit Diagnoses    Pleural effusion    -  Primary   Relevant Orders   DG Chest 2 View (Completed)   Loss of weight       Relevant Orders   Comprehensive metabolic panel (Completed)   Prealbumin (Completed)      I am having Ms. Liebler maintain her FIBER SELECT GUMMIES PO, loratadine, levothyroxine, promethazine, gabapentin, omeprazole,  hydrochlorothiazide, simvastatin, losartan, fluticasone, Polyethyl Glycol-Propyl Glycol (SYSTANE OP), diphenhydrAMINE, acetaminophen, cholecalciferol, Calcium Carbonate Antacid (TUMS ULTRA 1000 PO), Simethicone (GAS-X PO), Phenazopyridine HCl (AZO TABS PO), baclofen, ibuprofen, polyethylene glycol, senna, and ondansetron.  No orders of the defined types were placed in this encounter.   There are no discontinued medications.  Follow-up: Return in about 2 weeks (around 05/28/2016), or follow up weight loss hypoxia.   [name], MD

## 2016-05-15 ENCOUNTER — Encounter: Payer: Self-pay | Admitting: Internal Medicine

## 2016-05-15 DIAGNOSIS — Z09 Encounter for follow-up examination after completed treatment for conditions other than malignant neoplasm: Secondary | ICD-10-CM | POA: Insufficient documentation

## 2016-05-15 DIAGNOSIS — J9600 Acute respiratory failure, unspecified whether with hypoxia or hypercapnia: Secondary | ICD-10-CM | POA: Insufficient documentation

## 2016-05-15 LAB — PREALBUMIN: Prealbumin: 16 mg/dL — ABNORMAL LOW (ref 17–34)

## 2016-05-15 NOTE — Assessment & Plan Note (Signed)
Patient is stable post discharge and has no new issues or questions about her discharge plans. Patient is stable post discharge and has no new issues or questions about discharge plans at the visit today for hospital follow up.  I have reviewed the records from the hospital admission in detail with patient today.

## 2016-05-15 NOTE — Assessment & Plan Note (Signed)
S/p lap chole sept 19th

## 2016-05-15 NOTE — Assessment & Plan Note (Signed)
Secondary to bilateral pleural effusions noted on chest CT.  treated with lasix and incentive spirometry.  Repeat chest x ray is now normal

## 2016-05-16 ENCOUNTER — Other Ambulatory Visit: Payer: Self-pay | Admitting: Internal Medicine

## 2016-05-16 ENCOUNTER — Encounter: Payer: Self-pay | Admitting: Internal Medicine

## 2016-05-16 DIAGNOSIS — R748 Abnormal levels of other serum enzymes: Secondary | ICD-10-CM

## 2016-05-17 ENCOUNTER — Telehealth: Payer: Self-pay | Admitting: Internal Medicine

## 2016-05-17 NOTE — Telephone Encounter (Signed)
If her stools are loose,  She should stop the senokot.  If not,  She can try stopping the morning sennakot first and see if her constipation returns.  If she continues to move her bowels regularly,  she can then stop the evening dose of senakot  And continue the miralax

## 2016-05-17 NOTE — Telephone Encounter (Signed)
Pt called and had a question regarding some medication. She wants to know how long she should be taking senna (SENOKOT) 8.6 MG TABS tablet once in the morning and once at night along with Miralax once a day?  Please advise, tahnk you!  Call pt @ [phone]

## 2016-05-17 NOTE — Telephone Encounter (Signed)
Please advise 

## 2016-05-20 NOTE — Telephone Encounter (Signed)
Pt is aware of Dr.Tullo message. Pt requested a call to discuss questions  Pt contact [phone]

## 2016-05-20 NOTE — Telephone Encounter (Signed)
Talked with patient and patient will call back if constipation continues.

## 2016-05-29 NOTE — Telephone Encounter (Signed)
Mailed unread message to patient, thanks 

## 2016-06-03 ENCOUNTER — Other Ambulatory Visit: Payer: Self-pay | Admitting: Internal Medicine

## 2016-06-03 ENCOUNTER — Ambulatory Visit (INDEPENDENT_AMBULATORY_CARE_PROVIDER_SITE_OTHER): Payer: Commercial Managed Care - HMO | Admitting: Internal Medicine

## 2016-06-03 DIAGNOSIS — R748 Abnormal levels of other serum enzymes: Secondary | ICD-10-CM

## 2016-06-03 DIAGNOSIS — G8918 Other acute postprocedural pain: Secondary | ICD-10-CM | POA: Diagnosis not present

## 2016-06-03 DIAGNOSIS — I10 Essential (primary) hypertension: Secondary | ICD-10-CM

## 2016-06-03 DIAGNOSIS — Z9049 Acquired absence of other specified parts of digestive tract: Secondary | ICD-10-CM

## 2016-06-03 NOTE — Progress Notes (Signed)
Pre-visit discussion using our clinic review tool. No additional management support is needed unless otherwise documented below in the visit note.  

## 2016-06-03 NOTE — Patient Instructions (Addendum)
The melon seeds were bought at the [employer] on [address] taking culturelle   You can take Colace (docusate) , a stool softener 200 mg  Every night (will not cause diarrhea)   Increase your losartan to 100 mg daily   Goal is BP 130/80 or less.  We will check it then   We are repeating your liver enzyme today

## 2016-06-03 NOTE — Progress Notes (Signed)
Subjective:  Patient ID: Crystal Kidd, female    DOB: 02-12-1946  Age: 70 y.o. MRN: [id number]  CC: Diagnoses of Elevated alkaline phosphatase level, Post-op pain, Essential hypertension, and S/P laparoscopic cholecystectomy were pertinent to this visit.  HPI Crystal Kidd presents for two  week follow up on persistent fatigue, anorexia and weight loss following an admission and readmission . She was last Seen on Oct 3 following for hospital follow up after she was admitted with acute cholecystitis and underwent a lap chol. {ost operative course was  complicated by pleural effusions and hypoxia, both of which were present at time of initial discharge, resulting in a readmission. .  Pleural effusions have resolved by repeat CXR  Done here on Oct 3, and her ambulatory hypoxia has resolved as well. She reports feeling significantly better and has gained 2 lbs since last visit due to improvement in appetite.  She has resumed her walking program and is waling 1/4 mile daily without fatigue or dyspnea.    Outpatient Medications Prior to Visit  Medication Sig Dispense Refill  . acetaminophen (TYLENOL) 500 MG tablet Take 500 mg by mouth every 6 (six) hours as needed for headache (pain).    . baclofen (LIORESAL) 10 MG tablet Take 0.5 tablets (5 mg total) by mouth 3 (three) times daily as needed for muscle spasms. 30 each 0  . Calcium Carbonate Antacid (TUMS ULTRA 1000 PO) Take 1,000 mg by mouth See admin instructions. Take 1 tablet (1000 mg) by mouth daily, may take an additional tablet as needed for indigestion    . cholecalciferol (VITAMIN D) 1000 units tablet Take 1,000 Units by mouth daily.    . diphenhydrAMINE (BENADRYL) 25 MG tablet Take 25 mg by mouth at bedtime as needed for allergies.    Marland Kitchen FIBER SELECT GUMMIES PO Take 1 tablet by mouth daily as needed (constipation).     . fluticasone (FLONASE) 50 MCG/ACT nasal spray USE 2 SPRAYS IN EACH NOSTRIL EVERY DAY (Patient taking differently: USE 2  SPRAYS IN EACH NOSTRIL DAILY AS NEEDED FOR CONGESTION) 48 g 6  . gabapentin (NEURONTIN) 100 MG capsule TAKE 1 CAPSULE (100 MG TOTAL) BY MOUTH 3 (THREE) TIMES DAILY. EVERY 8 HOURS AS NEEDED (Patient taking differently: TAKE 1 CAPSULE (100 MG TOTAL) BY MOUTH EVERY 8 HOURS AS NEEDED FOR NECK PAIN) 90 capsule 3  . ibuprofen (ADVIL,MOTRIN) 200 MG tablet Take 200 mg by mouth every 6 (six) hours as needed for moderate pain.     Marland Kitchen levothyroxine (SYNTHROID, LEVOTHROID) 50 MCG tablet TAKE 1 TABLET DAILY EXCEPT TAKE 1 AND 1/2 TABLETS (75MCG) MONDAY, WEDNESDAY AND FRIDAY AS DIRECTED (Patient taking differently: Take 50-75 mcg by mouth See admin instructions. Take 1 tablet (50 mcg) by mouth daily before breakfast on Sunday, Tuesday, Thursday, Saturday, Take 1 1/2 tablet (75 mcg) before breakfast on Monday, Wednesday, Friday.) 108 tablet 2  . loratadine (CLARITIN) 10 MG tablet Take 10 mg by mouth daily.    Marland Kitchen losartan (COZAAR) 50 MG tablet TAKE 1 AND 1/2 TABLETS EVERY DAY (Patient taking differently: Take 75 mg by mouth daily. ) 135 tablet 1  . omeprazole (PRILOSEC) 40 MG capsule TAKE 1 CAPSULE EVERY DAY 90 capsule 3  . Phenazopyridine HCl (AZO TABS PO) Take 2 tablets by mouth daily as needed (urinary pain).    Vladimir Faster Glycol-Propyl Glycol (SYSTANE OP) Place 1 drop into both eyes 4 (four) times daily as needed (dry eyes).    . Simethicone (GAS-X PO)  Take 3 strips by mouth daily as needed (indigestion).    . simvastatin (ZOCOR) 20 MG tablet TAKE 1 TABLET EVERY EVENING 90 tablet 1  . hydrochlorothiazide (HYDRODIURIL) 25 MG tablet TAKE 1/2 TABLET DAILY AS NEEDED (Patient taking differently: TAKE 1/2 TABLET DAILY AS NEEDED FOR SWELLING OR SBP >150) 45 tablet 1  . ondansetron (ZOFRAN) 4 MG tablet Take 1 tablet (4 mg total) by mouth every 8 (eight) hours as needed for nausea. (Patient not taking: Reported on 06/03/2016) 10 tablet 5  . polyethylene glycol (MIRALAX / GLYCOLAX) packet Take 17 g by mouth 2 (two) times  daily. (Patient not taking: Reported on 06/03/2016) 14 each 0  . promethazine (PHENERGAN) 12.5 MG tablet Take 1 tablet (12.5 mg total) by mouth every 8 (eight) hours as needed for nausea or vomiting. (Patient not taking: Reported on 06/03/2016) 20 tablet 0  . senna (SENOKOT) 8.6 MG TABS tablet Take 1 tablet (8.6 mg total) by mouth daily as needed for mild constipation. (Patient not taking: Reported on 06/03/2016) 120 each 0   No facility-administered medications prior to visit.     Review of Systems;  Patient denies headache, fevers, malaise, unintentional weight loss, skin rash, eye pain, sinus congestion and sinus pain, sore throat, dysphagia,  hemoptysis , cough, dyspnea, wheezing, chest pain, palpitations, orthopnea, edema, abdominal pain, nausea, melena, diarrhea, constipation, flank pain, dysuria, hematuria, urinary  Frequency, nocturia, numbness, tingling, seizures,  Focal weakness, Loss of consciousness,  Tremor, insomnia, depression, anxiety, and suicidal ideation.      Objective:  BP (!) 160/80   Pulse 75   Temp 98.4 F (36.9 C) (Oral)   Resp 12   Ht '5\' 3"'  (1.6 m)   Wt 129 lb 4 oz (58.6 kg)   SpO2 97%   BMI 22.90 kg/m   BP Readings from Last 3 Encounters:  06/03/16 (!) 160/80  05/14/16 120/72  05/08/16 (!) 142/60    Wt Readings from Last 3 Encounters:  06/03/16 129 lb 4 oz (58.6 kg)  05/14/16 127 lb 12 oz (57.9 kg)  05/05/16 136 lb 14.4 oz (62.1 kg)    General appearance: alert, cooperative and appears stated age Ears: normal TM's and external ear canals both ears Throat: lips, mucosa, and tongue normal; teeth and gums normal Neck: no adenopathy, no carotid bruit, supple, symmetrical, trachea midline and thyroid not enlarged, symmetric, no tenderness/mass/nodules Back: symmetric, no curvature. ROM normal. No CVA tenderness. Lungs: clear to auscultation bilaterally Heart: regular rate and rhythm, S1, S2 normal, no murmur, click, rub or gallop Abdomen: soft,  non-tender; bowel sounds normal; no masses,  no organomegaly Pulses: 2+ and symmetric Skin: Skin color, texture, turgor normal. No rashes or lesions Lymph nodes: Cervical, supraclavicular, and axillary nodes normal.  Lab Results  Component Value Date   HGBA1C 5.6 08/25/2012   HGBA1C 5.6 05/05/2008   HGBA1C 5.7 01/07/2007    Lab Results  Component Value Date   CREATININE 0.75 05/14/2016   CREATININE 0.72 05/07/2016   CREATININE 0.60 05/05/2016    Lab Results  Component Value Date   WBC 7.1 05/08/2016   HGB 11.9 (L) 05/08/2016   HCT 38.1 05/08/2016   PLT 406 (H) 05/08/2016   GLUCOSE 98 05/14/2016   CHOL 141 11/30/2015   TRIG 125.0 11/30/2015   HDL 44.20 11/30/2015   LDLDIRECT 79.0 11/30/2015   LDLCALC 72 11/30/2015   ALT 35 05/14/2016   AST 23 05/14/2016   NA 138 05/14/2016   K 4.3 05/14/2016   CL  99 05/14/2016   CREATININE 0.75 05/14/2016   BUN 9 05/14/2016   CO2 32 05/14/2016   TSH 3.44 11/30/2015   HGBA1C 5.6 08/25/2012     Assessment & Plan:   Problem List Items Addressed This Visit    Essential hypertension    Not well controlled.  Will increase losartan dose to 100 mg daily.  Recheck 2 weeks.       Post-op pain    She continues to have episodes of minor right CVA and RUQ pain not accompanied by diaphoresis,  nausea or change in bowel habits,  Reassurance provided,  Repeat LFTS ordered and still pending, given recent persistent elevation of alk phos.  Lab Results  Component Value Date   ALT 35 05/14/2016   AST 23 05/14/2016   ALKPHOS 217 (H) 05/14/2016   BILITOT 0.5 05/14/2016         S/P laparoscopic cholecystectomy    Done in September 2017,.  Post operative pleural effusions have resolved by repeat chest films.        Other Visit Diagnoses    Elevated alkaline phosphatase level         A total of 40 minutes of face to face time was spent with patient more than half of which was spent in counselling and coordination of care   I am having  Ms. Britten maintain her FIBER SELECT GUMMIES PO, loratadine, levothyroxine, promethazine, gabapentin, omeprazole, simvastatin, losartan, fluticasone, Polyethyl Glycol-Propyl Glycol (SYSTANE OP), diphenhydrAMINE, acetaminophen, cholecalciferol, Calcium Carbonate Antacid (TUMS ULTRA 1000 PO), Simethicone (GAS-X PO), Phenazopyridine HCl (AZO TABS PO), baclofen, ibuprofen, polyethylene glycol, senna, and ondansetron.  No orders of the defined types were placed in this encounter.   There are no discontinued medications.  Follow-up: No Follow-up on file.   [name], MD

## 2016-06-04 ENCOUNTER — Encounter: Payer: Self-pay | Admitting: Internal Medicine

## 2016-06-04 DIAGNOSIS — Z9049 Acquired absence of other specified parts of digestive tract: Secondary | ICD-10-CM | POA: Insufficient documentation

## 2016-06-04 LAB — HEPATIC FUNCTION PANEL
ALT: 11 U/L (ref 0–35)
AST: 17 U/L (ref 0–37)
Albumin: 3.9 g/dL (ref 3.5–5.2)
Alkaline Phosphatase: 96 U/L (ref 39–117)
Bilirubin, Direct: 0.1 mg/dL (ref 0.0–0.3)
Total Bilirubin: 0.4 mg/dL (ref 0.2–1.2)
Total Protein: 6.7 g/dL (ref 6.0–8.3)

## 2016-06-04 NOTE — Assessment & Plan Note (Signed)
Not well controlled.  Will increase losartan dose to 100 mg daily.  Recheck 2 weeks.

## 2016-06-04 NOTE — Assessment & Plan Note (Signed)
Done in September 2017,.  Post operative pleural effusions have resolved by repeat chest films.

## 2016-06-04 NOTE — Assessment & Plan Note (Signed)
She continues to have episodes of minor right CVA and RUQ pain not accompanied by diaphoresis,  nausea or change in bowel habits,  Reassurance provided,  Repeat LFTS ordered and still pending, given recent persistent elevation of alk phos.  Lab Results  Component Value Date   ALT 35 05/14/2016   AST 23 05/14/2016   ALKPHOS 217 (H) 05/14/2016   BILITOT 0.5 05/14/2016

## 2016-06-04 NOTE — Assessment & Plan Note (Signed)
Secondary to chronic constipation.  High fiber diet recommended and daily use of stool softeners advised.

## 2016-06-25 ENCOUNTER — Telehealth: Payer: Self-pay | Admitting: Internal Medicine

## 2016-06-25 NOTE — Telephone Encounter (Signed)
Pt called wanting to know if she is supposed to get the CT scan to check her lungs. Pt is not due to get her AWV until next year. Please advise?  Call pt @ [phone]. Thank you!

## 2016-06-25 NOTE — Telephone Encounter (Signed)
No. The previously seen pulmonary nodules are unchanged by Nov 2015 CT and are considered benign, therefore no further follow up Cts are needed

## 2016-06-25 NOTE — Telephone Encounter (Signed)
Last order has expired (2015)

## 2016-06-26 ENCOUNTER — Encounter: Payer: Self-pay | Admitting: *Deleted

## 2016-06-26 ENCOUNTER — Telehealth: Payer: Self-pay | Admitting: *Deleted

## 2016-06-26 NOTE — Telephone Encounter (Signed)
Received busy signal will try again later.

## 2016-06-26 NOTE — Telephone Encounter (Signed)
Left message to return call to office.

## 2016-06-26 NOTE — Telephone Encounter (Signed)
See note from 06/25/16

## 2016-06-26 NOTE — Telephone Encounter (Signed)
Patient requested a call in reference to possibly having CT testing  Pt contact [phone]

## 2016-06-26 NOTE — Telephone Encounter (Signed)
Pt called back returning a call. Pt stated that she will be leaving in an hour and won't be back until later this afternoon. Thank you!  Call pt@ [phone]

## 2016-06-26 NOTE — Telephone Encounter (Signed)
Left voicemail & asked pt to return call or check mychart for response to CT.

## 2016-06-27 ENCOUNTER — Ambulatory Visit: Payer: Commercial Managed Care - HMO

## 2016-06-27 NOTE — Telephone Encounter (Signed)
Spoken to patient, she stated that she was supposed to have a FU CT done 2 months from last one. Patient stated that the provider said they would send paperwork to Dr. Derrel Nip so she could order the CT.  Dr. Derrel Nip has not received paperwork.  Patient will be bringing her discharge paperwork in so we can see what provider wanted.

## 2016-06-27 NOTE — Telephone Encounter (Signed)
Pt called back returning your call. Thank you!  Call pt @ [phone]

## 2016-06-28 ENCOUNTER — Other Ambulatory Visit: Payer: Self-pay | Admitting: Internal Medicine

## 2016-07-11 ENCOUNTER — Other Ambulatory Visit: Payer: Self-pay | Admitting: Internal Medicine

## 2016-08-09 ENCOUNTER — Ambulatory Visit (INDEPENDENT_AMBULATORY_CARE_PROVIDER_SITE_OTHER): Payer: Commercial Managed Care - HMO | Admitting: Internal Medicine

## 2016-08-09 ENCOUNTER — Encounter: Payer: Self-pay | Admitting: Internal Medicine

## 2016-08-09 VITALS — BP 150/80 | HR 88 | Temp 97.8°F | Ht 63.0 in | Wt 135.6 lb

## 2016-08-09 DIAGNOSIS — E034 Atrophy of thyroid (acquired): Secondary | ICD-10-CM

## 2016-08-09 DIAGNOSIS — R918 Other nonspecific abnormal finding of lung field: Secondary | ICD-10-CM

## 2016-08-09 DIAGNOSIS — Z1239 Encounter for other screening for malignant neoplasm of breast: Secondary | ICD-10-CM

## 2016-08-09 DIAGNOSIS — I10 Essential (primary) hypertension: Secondary | ICD-10-CM | POA: Diagnosis not present

## 2016-08-09 DIAGNOSIS — Z1231 Encounter for screening mammogram for malignant neoplasm of breast: Secondary | ICD-10-CM | POA: Diagnosis not present

## 2016-08-09 DIAGNOSIS — Z79899 Other long term (current) drug therapy: Secondary | ICD-10-CM

## 2016-08-09 DIAGNOSIS — E785 Hyperlipidemia, unspecified: Secondary | ICD-10-CM

## 2016-08-09 MED ORDER — BENZONATATE 100 MG PO CAPS: 200.0000 mg | ORAL_CAPSULE | Freq: Three times a day (TID) | ORAL | 3 refills | Status: DC | PRN

## 2016-08-09 NOTE — Patient Instructions (Signed)
You look much better!  I'm glad you are feeling back to normal.  Let me know if your home BP readings are persistently elevated to > 130/80  Mammogram ordered   See you in April  ,  You can schedule fasting labs prior to that visit

## 2016-08-09 NOTE — Progress Notes (Signed)
Subjective:  Patient ID: Crystal Kidd, female    DOB: 1946/03/07  Age: 70 y.o. MRN: [id number]  CC: The primary encounter diagnosis was Breast cancer screening, high risk patient. Diagnoses of Hyperlipidemia LDL goal <100, Hypothyroidism due to acquired atrophy of thyroid, Essential hypertension, Long-term use of high-risk medication, and Abnormal CT scan of lung were also pertinent to this visit.    Crystal Kidd presents for follow up on chronic issues including hypertension, hyperlipidemia, GERD with history of stricture and dysphagia and hypothyroidism.  She feels generally well,  And has gained 6 lbs since discharge from hospital in October for acute cholecystitis complicated by pleural effusions and hypoxemia. Her fatigue and dyspnea have resolved .    Patient is taking her medications as prescribed. home bps have been a bit labile as low as 0000000 systolic,and during those episodes she has felt very tired.  Other reading  120 to 130 for the  last week.   Sinus congestion.  Using benadryl at night and tessalon  Outpatient Medications Prior to Visit  Medication Sig Dispense Refill  . acetaminophen (TYLENOL) 500 MG tablet Take 500 mg by mouth every 6 (six) hours as needed for headache (pain).    . fluticasone (FLONASE) 50 MCG/ACT nasal spray USE 2 SPRAYS IN EACH NOSTRIL EVERY DAY (Patient taking differently: USE 2 SPRAYS IN EACH NOSTRIL DAILY AS NEEDED FOR CONGESTION) 48 g 6  . gabapentin (NEURONTIN) 100 MG capsule TAKE 1 CAPSULE (100 MG TOTAL) BY MOUTH 3 (THREE) TIMES DAILY. EVERY 8 HOURS AS NEEDED 90 capsule 1  . hydrochlorothiazide (HYDRODIURIL) 25 MG tablet TAKE 1/2 TABLET DAILY AS NEEDED 45 tablet 1  . ibuprofen (ADVIL,MOTRIN) 200 MG tablet Take 200 mg by mouth every 6 (six) hours as needed for moderate pain.     Marland Kitchen levothyroxine (SYNTHROID, LEVOTHROID) 50 MCG tablet TAKE 1 TABLET DAILY EXCEPT TAKE 1 AND 1/2 TABLETS ON MONDAY, WEDNESDAY AND FRIDAY AS DIRECTED  108 tablet 1  . loratadine  (CLARITIN) 10 MG tablet Take 10 mg by mouth daily.    Marland Kitchen losartan (COZAAR) 50 MG tablet TAKE 1 AND 1/2 TABLETS EVERY DAY (Patient taking differently: Take 75 mg by mouth daily. ) 135 tablet 1  . omeprazole (PRILOSEC) 40 MG capsule TAKE 1 CAPSULE EVERY DAY 90 capsule 3  . Polyethyl Glycol-Propyl Glycol (SYSTANE OP) Place 1 drop into both eyes 4 (four) times daily as needed (dry eyes).    . simvastatin (ZOCOR) 20 MG tablet TAKE 1 TABLET EVERY EVENING 90 tablet 1  . baclofen (LIORESAL) 10 MG tablet Take 0.5 tablets (5 mg total) by mouth 3 (three) times daily as needed for muscle spasms. 30 each 0  . Calcium Carbonate Antacid (TUMS ULTRA 1000 PO) Take 1,000 mg by mouth See admin instructions. Take 1 tablet (1000 mg) by mouth daily, may take an additional tablet as needed for indigestion    . cholecalciferol (VITAMIN D) 1000 units tablet Take 1,000 Units by mouth daily.    . diphenhydrAMINE (BENADRYL) 25 MG tablet Take 25 mg by mouth at bedtime as needed for allergies.    Marland Kitchen FIBER SELECT GUMMIES PO Take 1 tablet by mouth daily as needed (constipation).     . Phenazopyridine HCl (AZO TABS PO) Take 2 tablets by mouth daily as needed (urinary pain).    . Simethicone (GAS-X PO) Take 3 strips by mouth daily as needed (indigestion).     No facility-administered medications prior to visit.     Review  of Systems;  Patient denies headache, fevers, malaise, unintentional weight loss, skin rash, eye pain, sinus congestion and sinus pain, sore throat, dysphagia,  hemoptysis , cough, dyspnea, wheezing, chest pain, palpitations, orthopnea, edema, abdominal pain, nausea, melena, diarrhea, constipation, flank pain, dysuria, hematuria, urinary  Frequency, nocturia, numbness, tingling, seizures,  Focal weakness, Loss of consciousness,  Tremor, insomnia, depression, anxiety, and suicidal ideation.      Objective:  BP (!) 150/80   Pulse 88   Temp 97.8 F (36.6 C) (Oral)   Ht 5\' 3"  (1.6 m)   Wt 135 lb 9.6 oz (61.5  kg)   SpO2 98%   BMI 24.02 kg/m   BP Readings from Last 3 Encounters:  08/09/16 (!) 150/80  06/03/16 (!) 160/80  05/14/16 120/72    Wt Readings from Last 3 Encounters:  08/09/16 135 lb 9.6 oz (61.5 kg)  06/03/16 129 lb 4 oz (58.6 kg)  05/14/16 127 lb 12 oz (57.9 kg)    General appearance: alert, cooperative and appears stated age Ears: normal TM's and external ear canals both ears Throat: lips, mucosa, and tongue normal; teeth and gums normal Neck: no adenopathy, no carotid bruit, supple, symmetrical, trachea midline and thyroid not enlarged, symmetric, no tenderness/mass/nodules Back: symmetric, no curvature. ROM normal. No CVA tenderness. Lungs: clear to auscultation bilaterally Heart: regular rate and rhythm, S1, S2 normal, no murmur, click, rub or gallop Abdomen: soft, non-tender; bowel sounds normal; no masses,  no organomegaly Pulses: 2+ and symmetric Skin: Skin color, texture, turgor normal. No rashes or lesions Lymph nodes: Cervical, supraclavicular, and axillary nodes normal.  Lab Results  Component Value Date   HGBA1C 5.6 08/25/2012   HGBA1C 5.6 05/05/2008   HGBA1C 5.7 01/07/2007    Lab Results  Component Value Date   CREATININE 0.75 05/14/2016   CREATININE 0.72 05/07/2016   CREATININE 0.60 05/05/2016    Lab Results  Component Value Date   WBC 7.1 05/08/2016   HGB 11.9 (L) 05/08/2016   HCT 38.1 05/08/2016   PLT 406 (H) 05/08/2016   GLUCOSE 98 05/14/2016   CHOL 141 11/30/2015   TRIG 125.0 11/30/2015   HDL 44.20 11/30/2015   LDLDIRECT 79.0 11/30/2015   LDLCALC 72 11/30/2015   ALT 11 06/03/2016   AST 17 06/03/2016   NA 138 05/14/2016   K 4.3 05/14/2016   CL 99 05/14/2016   CREATININE 0.75 05/14/2016   BUN 9 05/14/2016   CO2 32 05/14/2016   TSH 3.44 11/30/2015   HGBA1C 5.6 08/25/2012    Dg Chest 2 View  Result Date: 05/04/2016 CLINICAL DATA:  Nausea and fever. EXAM: CHEST  2 VIEW COMPARISON:  May 01, 2016 FINDINGS: No pneumothorax. The  cardiomediastinal silhouette is stable. Suspected small bilateral effusions and bibasilar atelectasis. No convincing evidence of pneumonia. No other interval changes. IMPRESSION: Suspected small effusions and underlying atelectasis. No convincing evidence of pneumonia. Electronically Signed   By: Dorise Bullion III M.D   On: 05/04/2016 21:03   Ct Angio Chest Pe W And/or Wo Contrast  Result Date: 05/05/2016 CLINICAL DATA:  Status post laparoscopic cholecystectomy. Acute onset of low grade fever and hypoxia. Generalized abdominal pain and nausea. Initial encounter. EXAM: CT ANGIOGRAPHY CHEST CT ABDOMEN AND PELVIS WITH CONTRAST TECHNIQUE: Multidetector CT imaging of the chest was performed using the standard protocol during bolus administration of intravenous contrast. Multiplanar CT image reconstructions and MIPs were obtained to evaluate the vascular anatomy. Multidetector CT imaging of the abdomen and pelvis was performed using the standard  protocol during bolus administration of intravenous contrast. CONTRAST:  100 mL of Isovue 370 IV contrast COMPARISON:  Abdominal ultrasound performed 04/29/2016, and CT of the chest, abdomen and pelvis performed 05/09/2011 FINDINGS: CTA CHEST FINDINGS Cardiovascular: There is no evidence of significant pulmonary embolus. The heart is unremarkable in appearance. Mild calcification is noted at the aortic arch. The great vessels are grossly unremarkable in appearance. Mediastinum/Nodes: No mediastinal lymphadenopathy is seen. No pericardial effusion is identified. The visualized portions of the thyroid gland are unremarkable. No axillary lymphadenopathy is seen. Lungs/Pleura: Small bilateral pleural effusions are noted, right greater than left. Underlying interstitial prominence likely reflects mild pulmonary edema. No pneumothorax is seen. No masses are identified. Musculoskeletal: No acute osseous abnormalities are identified. The visualized musculature is unremarkable in  appearance. Review of the MIP images confirms the above findings. CT ABDOMEN and PELVIS FINDINGS Hepatobiliary: A mildly complex collection of fluid is noted at the gallbladder fossa, measuring 6.5 x 4.1 cm, with minimal air. Trace fluid tracks along the duodenum and into the pelvis. A bile leak cannot be excluded. The liver is unremarkable. Scattered clips are noted at the gallbladder fossa. The common bile duct remains normal in caliber. Pancreas: The pancreas is within normal limits. Spleen: The spleen is unremarkable in appearance. Adrenals/Urinary Tract: The adrenal glands are unremarkable in appearance. The kidneys are within normal limits. There is no evidence of hydronephrosis. No renal or ureteral stones are identified. Mild right-sided perinephric fluid is noted. Stomach/Bowel: The stomach is unremarkable in appearance. The small bowel is within normal limits. The patient is status post appendectomy. The colon is unremarkable in appearance. Vascular/Lymphatic: The abdominal aorta is unremarkable in appearance. The inferior vena cava is grossly unremarkable. No retroperitoneal lymphadenopathy is seen. No pelvic sidewall lymphadenopathy is identified. Reproductive: The patient is status post hysterectomy. No suspicious adnexal masses are seen. The bladder is moderately distended. A likely calcified epiploic appendage is noted within the fluid in the pelvis. Other: No additional soft tissue abnormalities are seen. Musculoskeletal: No acute osseous abnormalities are identified. The visualized musculature is unremarkable in appearance. Review of the MIP images confirms the above findings. IMPRESSION: 1. No evidence of significant pulmonary embolus. 2. Small bilateral pleural effusions, right greater than left. Underlying interstitial prominence likely reflects mild pulmonary edema. 3. Mildly complex collection of fluid at the gallbladder fossa, measuring 6.5 x 4.1 cm, with minimal air. Trace fluid tracks along  the duodenum and into the pelvis. This is somewhat more than expected status post cholecystectomy. A bile leak cannot be excluded. Depending on the patient's symptoms, would consider HIDA scan for further evaluation. Electronically Signed   By: Garald Balding M.D.   On: 05/05/2016 02:12   Nm Hepatobiliary Including Gb  Result Date: 05/05/2016 CLINICAL DATA:  Cholecystectomy 5 days ago. Nausea. Lack of bowel movements. Back pain. Right upper quadrant fluid collection on CT. EXAM: NUCLEAR MEDICINE HEPATOBILIARY IMAGING TECHNIQUE: Sequential images of the abdomen were obtained out to 60 minutes following intravenous administration of radiopharmaceutical. RADIOPHARMACEUTICALS:  5.5 MCi Tc-3m  Choletec IV COMPARISON:  CT of earlier today. FINDINGS: Prompt uptake of tracer by the liver. There is early medial porta hepatis activity which is all felt to be in the common duct. The postoperative fluid collection is positioned more laterally, when correlated with CT. This does not persist, arguing against biloma. Activity within the bowel at 15 minutes excludes biliary obstruction. IMPRESSION: No evidence of biloma or biliary obstruction. Electronically Signed   By: Adria Devon.D.  On: 05/05/2016 12:02   Ct Abdomen Pelvis W Contrast  Result Date: 05/05/2016 CLINICAL DATA:  Status post laparoscopic cholecystectomy. Acute onset of low grade fever and hypoxia. Generalized abdominal pain and nausea. Initial encounter. EXAM: CT ANGIOGRAPHY CHEST CT ABDOMEN AND PELVIS WITH CONTRAST TECHNIQUE: Multidetector CT imaging of the chest was performed using the standard protocol during bolus administration of intravenous contrast. Multiplanar CT image reconstructions and MIPs were obtained to evaluate the vascular anatomy. Multidetector CT imaging of the abdomen and pelvis was performed using the standard protocol during bolus administration of intravenous contrast. CONTRAST:  100 mL of Isovue 370 IV contrast COMPARISON:   Abdominal ultrasound performed 04/29/2016, and CT of the chest, abdomen and pelvis performed 05/09/2011 FINDINGS: CTA CHEST FINDINGS Cardiovascular: There is no evidence of significant pulmonary embolus. The heart is unremarkable in appearance. Mild calcification is noted at the aortic arch. The great vessels are grossly unremarkable in appearance. Mediastinum/Nodes: No mediastinal lymphadenopathy is seen. No pericardial effusion is identified. The visualized portions of the thyroid gland are unremarkable. No axillary lymphadenopathy is seen. Lungs/Pleura: Small bilateral pleural effusions are noted, right greater than left. Underlying interstitial prominence likely reflects mild pulmonary edema. No pneumothorax is seen. No masses are identified. Musculoskeletal: No acute osseous abnormalities are identified. The visualized musculature is unremarkable in appearance. Review of the MIP images confirms the above findings. CT ABDOMEN and PELVIS FINDINGS Hepatobiliary: A mildly complex collection of fluid is noted at the gallbladder fossa, measuring 6.5 x 4.1 cm, with minimal air. Trace fluid tracks along the duodenum and into the pelvis. A bile leak cannot be excluded. The liver is unremarkable. Scattered clips are noted at the gallbladder fossa. The common bile duct remains normal in caliber. Pancreas: The pancreas is within normal limits. Spleen: The spleen is unremarkable in appearance. Adrenals/Urinary Tract: The adrenal glands are unremarkable in appearance. The kidneys are within normal limits. There is no evidence of hydronephrosis. No renal or ureteral stones are identified. Mild right-sided perinephric fluid is noted. Stomach/Bowel: The stomach is unremarkable in appearance. The small bowel is within normal limits. The patient is status post appendectomy. The colon is unremarkable in appearance. Vascular/Lymphatic: The abdominal aorta is unremarkable in appearance. The inferior vena cava is grossly  unremarkable. No retroperitoneal lymphadenopathy is seen. No pelvic sidewall lymphadenopathy is identified. Reproductive: The patient is status post hysterectomy. No suspicious adnexal masses are seen. The bladder is moderately distended. A likely calcified epiploic appendage is noted within the fluid in the pelvis. Other: No additional soft tissue abnormalities are seen. Musculoskeletal: No acute osseous abnormalities are identified. The visualized musculature is unremarkable in appearance. Review of the MIP images confirms the above findings. IMPRESSION: 1. No evidence of significant pulmonary embolus. 2. Small bilateral pleural effusions, right greater than left. Underlying interstitial prominence likely reflects mild pulmonary edema. 3. Mildly complex collection of fluid at the gallbladder fossa, measuring 6.5 x 4.1 cm, with minimal air. Trace fluid tracks along the duodenum and into the pelvis. This is somewhat more than expected status post cholecystectomy. A bile leak cannot be excluded. Depending on the patient's symptoms, would consider HIDA scan for further evaluation. Electronically Signed   By: Garald Balding M.D.   On: 05/05/2016 02:12   Dg Abd 2 Views  Result Date: 05/04/2016 CLINICAL DATA:  Fever with nausea. EXAM: ABDOMEN - 2 VIEW COMPARISON:  None. FINDINGS: Small effusions with bibasilar atelectasis suspected. No free air, portal venous gas, or pneumatosis. No evidence of bowel obstruction. IMPRESSION: No abnormality  seen within the abdomen. Electronically Signed   By: Dorise Bullion III M.D   On: 05/04/2016 21:03    Assessment & Plan:   Problem List Items Addressed This Visit    Abnormal CT scan of lung    The previously seen pulmonary nodules are unchanged by Nov 2015 CT and are considered benign, therefore no further follow up Cts are needed        Essential hypertension    Elevated today.  Reviewed list of meds, patient is not taking OTC meds that could be causing,. It.  Have  asked patient to recheck bp at home a minimum of 5 times over the next 4 weeks and call readings to office for adjustment of medications.        Relevant Orders   Comprehensive metabolic panel   Hyperlipidemia LDL goal <100    LDL and triglycerides have been at goal on current medications. She  has no side effects and liver enzymes are normal. No changes today.and will return for fasting lipids.  Lab Results  Component Value Date   CHOL 141 11/30/2015   HDL 44.20 11/30/2015   LDLCALC 72 11/30/2015   LDLDIRECT 79.0 11/30/2015   TRIG 125.0 11/30/2015   CHOLHDL 3 11/30/2015   Lab Results  Component Value Date   ALT 11 06/03/2016   AST 17 06/03/2016   ALKPHOS 96 06/03/2016   BILITOT 0.4 06/03/2016         Relevant Orders   Lipid panel   Hypothyroidism   Relevant Orders   TSH    Other Visit Diagnoses    Breast cancer screening, high risk patient    -  Primary   Relevant Orders   MM SCREENING BREAST TOMO BILATERAL   Long-term use of high-risk medication       Relevant Orders   CBC with Differential/Platelet   Vitamin B12      I have discontinued Ms. Mcbain's FIBER SELECT GUMMIES PO, diphenhydrAMINE, cholecalciferol, Calcium Carbonate Antacid (TUMS ULTRA 1000 PO), Simethicone (GAS-X PO), Phenazopyridine HCl (AZO TABS PO), and baclofen. I have also changed her benzonatate. Additionally, I am having her maintain her loratadine, omeprazole, simvastatin, losartan, fluticasone, Polyethyl Glycol-Propyl Glycol (SYSTANE OP), acetaminophen, ibuprofen, hydrochlorothiazide, levothyroxine, and gabapentin.  Meds ordered this encounter  Medications  . benzonatate (TESSALON) 100 MG capsule    Sig: Take 2 capsules (200 mg total) by mouth 3 (three) times daily as needed for cough.    Dispense:  60 capsule    Refill:  3    Medications Discontinued During This Encounter  Medication Reason  . Simethicone (GAS-X PO) Patient has not taken in last 30 days  . Phenazopyridine HCl (AZO TABS  PO) Patient has not taken in last 30 days  . [place] PO Patient has not taken in last 30 days  . diphenhydrAMINE (BENADRYL) 25 MG tablet Patient has not taken in last 30 days  . cholecalciferol (VITAMIN D) 1000 units tablet Patient has not taken in last 30 days  . Calcium Carbonate Antacid (TUMS ULTRA 1000 PO) Patient has not taken in last 30 days  . baclofen (LIORESAL) 10 MG tablet Patient has not taken in last 30 days    Follow-up: Return in about 6 months (around 02/07/2017).   [name], MD

## 2016-08-09 NOTE — Progress Notes (Signed)
Pre visit review using our clinic review tool, if applicable. No additional management support is needed unless otherwise documented below in the visit note. 

## 2016-08-11 NOTE — Assessment & Plan Note (Signed)
LDL and triglycerides have been at goal on current medications. She  has no side effects and liver enzymes are normal. No changes today.and will return for fasting lipids.  Lab Results  Component Value Date   CHOL 141 11/30/2015   HDL 44.20 11/30/2015   LDLCALC 72 11/30/2015   LDLDIRECT 79.0 11/30/2015   TRIG 125.0 11/30/2015   CHOLHDL 3 11/30/2015   Lab Results  Component Value Date   ALT 11 06/03/2016   AST 17 06/03/2016   ALKPHOS 96 06/03/2016   BILITOT 0.4 06/03/2016

## 2016-08-11 NOTE — Assessment & Plan Note (Signed)
The previously seen pulmonary nodules are unchanged by Nov 2015 CT and are considered benign, therefore no further follow up Cts are needed   

## 2016-08-11 NOTE — Assessment & Plan Note (Signed)
Elevated today.  Reviewed list of meds, patient is not taking OTC meds that could be causing,. It.  Have asked patient to recheck bp at home a minimum of 5 times over the next 4 weeks and call readings to office for adjustment of medications.   

## 2016-08-26 ENCOUNTER — Other Ambulatory Visit: Payer: Self-pay | Admitting: Internal Medicine

## 2016-09-02 ENCOUNTER — Encounter: Payer: Self-pay | Admitting: Internal Medicine

## 2016-10-29 DIAGNOSIS — D2271 Melanocytic nevi of right lower limb, including hip: Secondary | ICD-10-CM | POA: Diagnosis not present

## 2016-10-29 DIAGNOSIS — D225 Melanocytic nevi of trunk: Secondary | ICD-10-CM | POA: Diagnosis not present

## 2016-10-29 DIAGNOSIS — D2272 Melanocytic nevi of left lower limb, including hip: Secondary | ICD-10-CM | POA: Diagnosis not present

## 2016-10-29 DIAGNOSIS — L80 Vitiligo: Secondary | ICD-10-CM | POA: Diagnosis not present

## 2016-10-29 DIAGNOSIS — L821 Other seborrheic keratosis: Secondary | ICD-10-CM | POA: Diagnosis not present

## 2016-10-29 DIAGNOSIS — L814 Other melanin hyperpigmentation: Secondary | ICD-10-CM | POA: Diagnosis not present

## 2016-10-29 DIAGNOSIS — D692 Other nonthrombocytopenic purpura: Secondary | ICD-10-CM | POA: Diagnosis not present

## 2016-11-01 ENCOUNTER — Other Ambulatory Visit: Payer: Self-pay | Admitting: Internal Medicine

## 2016-11-07 ENCOUNTER — Other Ambulatory Visit: Payer: Self-pay | Admitting: Internal Medicine

## 2016-11-13 ENCOUNTER — Telehealth: Payer: Self-pay | Admitting: *Deleted

## 2016-11-13 MED ORDER — OMEPRAZOLE 40 MG PO CPDR: 40.0000 mg | DELAYED_RELEASE_CAPSULE | Freq: Every day | ORAL | 2 refills | Status: DC

## 2016-11-13 NOTE — Telephone Encounter (Signed)
Spoke with pt and informed her that the rx was sent on 11/11/2016 to [facility]. Pt stated that [facility] called her this morning and stated that they had not received the refill. So let know that I would resend it. Rx was resent.

## 2016-11-13 NOTE — Telephone Encounter (Signed)
Requested medication refill for :omeprazole  Pharmacy:[facility]  Please Contact Pt when ready or sent to Pharmacy:   [phone]

## 2016-11-25 ENCOUNTER — Ambulatory Visit
Admission: RE | Admit: 2016-11-25 | Discharge: 2016-11-25 | Disposition: A | Discharge status: Home / Self Care | Payer: Commercial Managed Care - HMO | Source: Ambulatory Visit | Attending: Internal Medicine | Admitting: Internal Medicine

## 2016-11-25 DIAGNOSIS — Z1239 Encounter for other screening for malignant neoplasm of breast: Secondary | ICD-10-CM

## 2016-11-25 DIAGNOSIS — Z1231 Encounter for screening mammogram for malignant neoplasm of breast: Secondary | ICD-10-CM | POA: Insufficient documentation

## 2016-11-29 ENCOUNTER — Ambulatory Visit (INDEPENDENT_AMBULATORY_CARE_PROVIDER_SITE_OTHER): Payer: Commercial Managed Care - HMO

## 2016-11-29 VITALS — BP 136/78 | HR 74 | Temp 97.9°F | Resp 12 | Ht 63.0 in | Wt 143.4 lb

## 2016-11-29 DIAGNOSIS — Z0000 Encounter for general adult medical examination without abnormal findings: Secondary | ICD-10-CM

## 2016-11-29 NOTE — Progress Notes (Signed)
Subjective:   Crystal Kidd is a 71 y.o. female who presents for Medicare Annual (Subsequent) preventive examination.  Review of Systems:  No ROS.  Medicare Wellness Visit.  Cardiac Risk Factors include: advanced age (>3men, >16 women);hypertension     Objective:     Vitals: BP 136/78 (BP Location: Left Arm, Patient Position: Sitting, Cuff Size: Normal)   Pulse 74   Temp 97.9 F (36.6 C) (Oral)   Resp 12   Ht 5\' 3"  (1.6 m)   Wt 143 lb 6.4 oz (65 kg)   SpO2 97%   BMI 25.40 kg/m   Body mass index is 25.4 kg/m.   Tobacco History  Smoking Status  . Never Smoker  Smokeless Tobacco  . Never Used     Counseling given: Not Answered   Past Medical History:  Diagnosis Date  . Allergic rhinoconjunctivitis   . Allergy 1992  . Arthritis   . Cataract   . Diverticulosis   . GERD (gastroesophageal reflux disease)    PMH of esophageal stricture  . Hyperlipidemia   . Hypertension   . Osteopenia    BMD @[facility]   . Thyroid disease    hypothyroidism  . Transfusion history 1968   post partum  . Trapezius muscle spasm 04/13/2015  . Vitiligo    Past Surgical History:  Procedure Laterality Date  . ABDOMINAL HYSTERECTOMY     for dysfunctional menses; USO with TAH  . APPENDECTOMY    . BREAST EXCISIONAL BIOPSY Right 1972   NEG  . BREAST EXCISIONAL BIOPSY Left 1980'S   NEG  . BREAST SURGERY     biopsy X 2  . CHOLECYSTECTOMY N/A 04/30/2016   Procedure: LAPAROSCOPIC CHOLECYSTECTOMY;  Surgeon: Georganna Skeans, MD;  Location: [facility];  Service: General;  Laterality: N/A;  . COLONOSCOPY     X2; Tics; Dr Fuller Plan, GI  . ESOPHAGEAL DILATION     X2  . EYE SURGERY    . FRACTURE SURGERY  2003or 2004  . ROTATOR CUFF REPAIR     R shoulder  . TUBAL LIGATION     befor hysterectomy  . UPPER GASTROINTESTINAL ENDOSCOPY     Family History  Problem Relation Age of Onset  . Breast cancer Sister     mammograms @ [facility]  . Colon polyps Sister   . COPD Father   . Cancer Mother    breast&stomach  . Stomach cancer Mother   . Breast cancer Mother   . Colon cancer Maternal Aunt   . Cancer Maternal Aunt 80    colon cA  . Coronary artery disease Brother   . COPD Brother   . Colon polyps Brother   . Cancer Sister 62    BREAST  . COPD Son   . Diabetes Neg Hx   . Stroke Neg Hx   . Heart disease Neg Hx   . Esophageal cancer Neg Hx   . Rectal cancer Neg Hx    History  Sexual Activity  . Sexual activity: Not Currently    Outpatient Encounter Prescriptions as of 11/29/2016  Medication Sig  . acetaminophen (TYLENOL) 500 MG tablet Take 500 mg by mouth every 6 (six) hours as needed for headache (pain).  . cholecalciferol (VITAMIN D) 1000 units tablet Take 1,000 Units by mouth 2 (two) times daily.  . fluticasone (FLONASE) 50 MCG/ACT nasal spray USE 2 SPRAYS IN EACH NOSTRIL EVERY DAY (Patient taking differently: USE 2 SPRAYS IN EACH NOSTRIL DAILY AS NEEDED FOR CONGESTION)  . gabapentin (  NEURONTIN) 100 MG capsule TAKE 1 CAPSULE (100 MG TOTAL) BY MOUTH 3 (THREE) TIMES DAILY. EVERY 8 HOURS AS NEEDED  . hydrochlorothiazide (HYDRODIURIL) 25 MG tablet TAKE 1/2 TABLET DAILY AS NEEDED  . ibuprofen (ADVIL,MOTRIN) 200 MG tablet Take 200 mg by mouth every 6 (six) hours as needed for moderate pain.   Marland Kitchen levothyroxine (SYNTHROID, LEVOTHROID) 50 MCG tablet TAKE 1 TABLET DAILY EXCEPT TAKE 1 AND 1/2 TABLETS ON MONDAY, WEDNESDAY AND FRIDAY AS DIRECTED   . loratadine (CLARITIN) 10 MG tablet Take 10 mg by mouth daily.  Marland Kitchen losartan (COZAAR) 50 MG tablet TAKE 1 AND 1/2 TABLETS EVERY DAY (Patient taking differently: TAKE 2 TABLETS EVERY DAY)  . omeprazole (PRILOSEC) 40 MG capsule Take 1 capsule (40 mg total) by mouth daily.  Vladimir Faster Glycol-Propyl Glycol (SYSTANE OP) Place 1 drop into both eyes 4 (four) times daily as needed (dry eyes).  . simvastatin (ZOCOR) 20 MG tablet TAKE 1 TABLET EVERY EVENING  . benzonatate (TESSALON) 100 MG capsule Take 2 capsules (200 mg total) by mouth 3 (three) times  daily as needed for cough. (Patient not taking: Reported on 11/29/2016)   No facility-administered encounter medications on file as of 11/29/2016.     Activities of Daily Living In your present state of health, do you have any difficulty performing the following activities: 11/29/2016 05/07/2016  Hearing? N N  Vision? N N  Difficulty concentrating or making decisions? N N  Walking or climbing stairs? N N  Dressing or bathing? N N  Doing errands, shopping? N N  Preparing Food and eating ? N -  Using the Toilet? N -  In the past six months, have you accidently leaked urine? N -  Do you have problems with loss of bowel control? N -  Managing your Medications? N -  Managing your Finances? N -  Housekeeping or managing your Housekeeping? N -  Some recent data might be hidden    Patient Care Team: [name], MD as PCP - General (Internal Medicine)    Assessment:    This is a routine wellness examination for [name]. The goal of the wellness visit is to assist the patient how to close the gaps in care and create a preventative care plan for the patient.   Taking calcium VIT D as appropriate/Osteoporosis risk reviewed.  Medications reviewed; taking without issues or barriers.  Safety issues reviewed; smoke detectors in the home. No firearms in the home.  Wears seatbelts when driving or riding with others. Patient does wear sunscreen or protective clothing when in direct sunlight. No violence in the home.  Patient is alert, normal appearance, oriented to person/place/and time. Correctly identified the president of the [place], recall of 3/3 words, and performing simple calculations.  Patient displays appropriate judgement and can read correct time from watch face.  No new identified risk were noted.  No failures at ADL's or IADL's.   BMI- discussed the importance of a healthy diet, water intake and exercise. Educational material provided.   24 hour diet recallt: Breakfast:  Cereal Lunch: Pizza Dinner: Shrimp, green vegetable, pasta Daily fluid intake: 2 cups of caffeine, 8 cups of water  HTN- followed by PCP.  Dental- every six months.  Dr. Quillian Quince.  Eye- Visual acuity not assessed per patient preference since they have regular follow up with the ophthalmologist.  Wears corrective lenses.  Sleep patterns- Sleeps 7-8 hours at night.  Wakes feeling rested.    Health maintenance gaps- closed.  Patient  Concerns: None at this time. Follow up with PCP as needed.  Exercise Activities and Dietary recommendations Current Exercise Habits: Home exercise routine, Type of exercise: walking;calisthenics;treadmill, Time (Minutes): 40, Frequency (Times/Week): 3, Weekly Exercise (Minutes/Week): 120, Intensity: Mild  Goals    . Healthy Lifestyle          Low carb foods Stay active and continue exercise regimen Stay hydrated       Fall Risk Fall Risk  11/29/2016 08/09/2016 11/30/2015 11/21/2014 09/02/2013  Falls in the past year? No No No No No   Depression Screen PHQ 2/9 Scores 11/29/2016 08/09/2016 11/30/2015 11/21/2014  PHQ - 2 Score 0 0 0 0     Cognitive Function MMSE - Mini Mental State Exam 11/29/2016  Orientation to time 5  Orientation to Place 5  Registration 3  Attention/ Calculation 5  Recall 3  Language- name 2 objects 2  Language- repeat 1  Language- follow 3 step command 3  Language- read & follow direction 1  Write a sentence 1  Copy design 1  Total score 30        Immunization History  Administered Date(s) Administered  . H1N1 08/31/2008  . Influenza Split 05/22/2011, 04/29/2012  . Influenza Whole 08/12/2002, 05/05/2008, 05/16/2009, 05/21/2010  . Influenza, High Dose Seasonal PF 04/12/2013, 04/09/2016  . Influenza,inj,Quad PF,36+ Mos 04/20/2014, 05/02/2015  . Pneumococcal Conjugate-13 06/13/2014  . Pneumococcal Polysaccharide-23 06/21/2010, 11/30/2015  . Td 04/13/2004  . Tdap 05/11/2014  . Zoster 07/26/2009   Screening  Tests Health Maintenance  Topic Date Due  . MAMMOGRAM  12/08/2016  . INFLUENZA VACCINE  03/12/2017  . COLONOSCOPY  12/25/2018  . TETANUS/TDAP  05/11/2024  . DEXA SCAN  Completed  . Hepatitis C Screening  Completed  . PNA vac Low Risk Adult  Completed      Plan:    End of life planning; Advance aging; Advanced directives discussed. Copy of current HCPOA/Living Will requested.    Medicare Attestation I have personally reviewed: The patient's medical and social history Their use of alcohol, tobacco or illicit drugs Their current medications and supplements The patient's functional ability including ADLs,fall risks, home safety risks, cognitive, and hearing and visual impairment Diet and physical activities Evidence for depression   The patient's weight, height, BMI, and visual acuity have been recorded in the chart.  I have made referrals and provided education to the patient based on review of the above and I have provided the patient with a written personalized care plan for preventive services.    During the course of the visit the patient was educated and counseled about the following appropriate screening and preventive services:   Vaccines to include Pneumoccal, Influenza, Hepatitis B, Td, Zostavax, HCV  Colorectal cancer screening-UTD  Bone density screening-UTD  Diabetes screening  Glaucoma screening-annual eye exam   Mammography-UTD  Nutrition counseling   Patient Instructions (the written plan) was given to the patient.   Varney Biles, LPN  [id number]

## 2016-11-29 NOTE — Patient Instructions (Addendum)
  Crystal Kidd , Thank you for taking time to come for your Medicare Wellness Visit. I appreciate your ongoing commitment to your health goals. Please review the following plan we discussed and let me know if I can assist you in the future.   Follow up with Dr. Derrel Nip as needed.    Bring a copy of your [place] and/or Living Will to be scanned into chart.  Have a great day!  These are the goals we discussed: Goals    . Healthy Lifestyle          Low carb foods Stay active and continue exercise regimen Stay hydrated        This is a list of the screening recommended for you and due dates:  Health Maintenance  Topic Date Due  . Mammogram  12/08/2016  . Flu Shot  03/12/2017  . Colon Cancer Screening  12/25/2018  . Tetanus Vaccine  05/11/2024  . DEXA scan (bone density measurement)  Completed  .  Hepatitis C: One time screening is recommended by Center for Disease Control  (CDC) for  adults born from [birth year] through 1966.   Completed  . Pneumonia vaccines  Completed

## 2016-12-01 NOTE — Progress Notes (Signed)
  I have reviewed the above information and agree with above.   Carles Florea, MD 

## 2016-12-06 ENCOUNTER — Encounter: Payer: Commercial Managed Care - HMO | Admitting: Internal Medicine

## 2016-12-16 ENCOUNTER — Other Ambulatory Visit (INDEPENDENT_AMBULATORY_CARE_PROVIDER_SITE_OTHER): Payer: Medicare HMO

## 2016-12-16 ENCOUNTER — Other Ambulatory Visit: Payer: Self-pay | Admitting: Internal Medicine

## 2016-12-16 DIAGNOSIS — E034 Atrophy of thyroid (acquired): Secondary | ICD-10-CM

## 2016-12-16 DIAGNOSIS — Z79899 Other long term (current) drug therapy: Secondary | ICD-10-CM

## 2016-12-16 DIAGNOSIS — I10 Essential (primary) hypertension: Secondary | ICD-10-CM

## 2016-12-16 DIAGNOSIS — E785 Hyperlipidemia, unspecified: Secondary | ICD-10-CM | POA: Diagnosis not present

## 2016-12-16 LAB — CBC WITH DIFFERENTIAL/PLATELET
Basophils Absolute: 0.1 10*3/uL (ref 0.0–0.1)
Basophils Relative: 1.9 % (ref 0.0–3.0)
EOS ABS: 0.4 10*3/uL (ref 0.0–0.7)
Eosinophils Relative: 9 % — ABNORMAL HIGH (ref 0.0–5.0)
HCT: 40.4 % (ref 36.0–46.0)
HEMOGLOBIN: 13.6 g/dL (ref 12.0–15.0)
LYMPHS ABS: 1.6 10*3/uL (ref 0.7–4.0)
Lymphocytes Relative: 32.3 % (ref 12.0–46.0)
MCHC: 33.8 g/dL (ref 30.0–36.0)
MCV: 89.6 fl (ref 78.0–100.0)
MONO ABS: 0.6 10*3/uL (ref 0.1–1.0)
Monocytes Relative: 12 % (ref 3.0–12.0)
NEUTROS PCT: 44.8 % (ref 43.0–77.0)
Neutro Abs: 2.2 10*3/uL (ref 1.4–7.7)
Platelets: 289 10*3/uL (ref 150.0–400.0)
RBC: 4.51 Mil/uL (ref 3.87–5.11)
RDW: 13.4 % (ref 11.5–15.5)
WBC: 4.9 10*3/uL (ref 4.0–10.5)

## 2016-12-16 LAB — COMPREHENSIVE METABOLIC PANEL
ALBUMIN: 4 g/dL (ref 3.5–5.2)
ALK PHOS: 81 U/L (ref 39–117)
ALT: 13 U/L (ref 0–35)
AST: 19 U/L (ref 0–37)
BILIRUBIN TOTAL: 0.5 mg/dL (ref 0.2–1.2)
BUN: 17 mg/dL (ref 6–23)
CO2: 31 mEq/L (ref 19–32)
CREATININE: 0.83 mg/dL (ref 0.40–1.20)
Calcium: 9.3 mg/dL (ref 8.4–10.5)
Chloride: 103 mEq/L (ref 96–112)
GFR: 71.93 mL/min (ref >60.00)
GLUCOSE: 100 mg/dL — AB (ref 70–99)
Potassium: 4 mEq/L (ref 3.5–5.1)
SODIUM: 138 meq/L (ref 135–145)
TOTAL PROTEIN: 6.7 g/dL (ref 6.0–8.3)

## 2016-12-16 LAB — VITAMIN B12: VITAMIN B 12: 483 pg/mL (ref 211–911)

## 2016-12-16 LAB — LIPID PANEL
CHOLESTEROL: 141 mg/dL (ref 0–200)
HDL: 44.7 mg/dL (ref >39.00)
LDL CALC: 75 mg/dL (ref 0–99)
NonHDL: 96.2
TRIGLYCERIDES: 108 mg/dL (ref 0.0–149.0)
Total CHOL/HDL Ratio: 3
VLDL: 21.6 mg/dL (ref 0.0–40.0)

## 2016-12-16 LAB — TSH: TSH: 10.96 u[IU]/mL — AB (ref 0.35–4.50)

## 2016-12-16 MED ORDER — LEVOTHYROXINE SODIUM 75 MCG PO TABS: 75.0000 ug | ORAL_TABLET | Freq: Every day | ORAL | 0 refills | Status: DC

## 2016-12-18 ENCOUNTER — Encounter: Payer: Self-pay | Admitting: Internal Medicine

## 2016-12-18 ENCOUNTER — Ambulatory Visit (INDEPENDENT_AMBULATORY_CARE_PROVIDER_SITE_OTHER): Payer: Medicare HMO | Admitting: Internal Medicine

## 2016-12-18 VITALS — BP 124/78 | HR 77 | Temp 98.0°F | Resp 16 | Ht 63.0 in | Wt 142.2 lb

## 2016-12-18 DIAGNOSIS — Z803 Family history of malignant neoplasm of breast: Secondary | ICD-10-CM

## 2016-12-18 DIAGNOSIS — I10 Essential (primary) hypertension: Secondary | ICD-10-CM | POA: Diagnosis not present

## 2016-12-18 DIAGNOSIS — Z0000 Encounter for general adult medical examination without abnormal findings: Secondary | ICD-10-CM

## 2016-12-18 DIAGNOSIS — E785 Hyperlipidemia, unspecified: Secondary | ICD-10-CM | POA: Diagnosis not present

## 2016-12-18 DIAGNOSIS — E039 Hypothyroidism, unspecified: Secondary | ICD-10-CM | POA: Diagnosis not present

## 2016-12-18 MED ORDER — LOSARTAN POTASSIUM 100 MG PO TABS: 100.0000 mg | ORAL_TABLET | Freq: Every day | ORAL | 0 refills | Status: DC

## 2016-12-18 NOTE — Patient Instructions (Addendum)
You can combine your  thyroid medication with omeprazole,  Take both  at least 30 mintues prior to eating  OR:   you can take it at bedtime with simvastatin ( at least 2 hours after eating )  REPEAT THYROID TEST ON OR AROUND June 18   WE WILL REPEAT YOUR BREAST MRI IN [place] IN October   Health Maintenance for Postmenopausal Women Menopause is a normal process in which your reproductive ability comes to an end. This process happens gradually over a span of months to years, usually between the ages of 23 and 45. Menopause is complete when you have missed 12 consecutive menstrual periods. It is important to talk with your health care provider about some of the most common conditions that affect postmenopausal women, such as heart disease, cancer, and bone loss (osteoporosis). Adopting a healthy lifestyle and getting preventive care can help to promote your health and wellness. Those actions can also lower your chances of developing some of these common conditions. What should I know about menopause? During menopause, you may experience a number of symptoms, such as:  Moderate-to-severe hot flashes.  Night sweats.  Decrease in sex drive.  Mood swings.  Headaches.  Tiredness.  Irritability.  Memory problems.  Insomnia. Choosing to treat or not to treat menopausal changes is an individual decision that you make with your health care provider. What should I know about hormone replacement therapy and supplements? Hormone therapy products are effective for treating symptoms that are associated with menopause, such as hot flashes and night sweats. Hormone replacement carries certain risks, especially as you become older. If you are thinking about using estrogen or estrogen with progestin treatments, discuss the benefits and risks with your health care provider. What should I know about heart disease and stroke? Heart disease, heart attack, and stroke become more likely as you age.  This may be due, in part, to the hormonal changes that your body experiences during menopause. These can affect how your body processes dietary fats, triglycerides, and cholesterol. Heart attack and stroke are both medical emergencies. There are many things that you can do to help prevent heart disease and stroke:  Have your blood pressure checked at least every 1-2 years. High blood pressure causes heart disease and increases the risk of stroke.  If you are 65-80 years old, ask your health care provider if you should take aspirin to prevent a heart attack or a stroke.  Do not use any tobacco products, including cigarettes, chewing tobacco, or electronic cigarettes. If you need help quitting, ask your health care provider.  It is important to eat a healthy diet and maintain a healthy weight.  Be sure to include plenty of vegetables, fruits, low-fat dairy products, and lean protein.  Avoid eating foods that are high in solid fats, added sugars, or salt (sodium).  Get regular exercise. This is one of the most important things that you can do for your health.  Try to exercise for at least 150 minutes each week. The type of exercise that you do should increase your heart rate and make you sweat. This is known as moderate-intensity exercise.  Try to do strengthening exercises at least twice each week. Do these in addition to the moderate-intensity exercise.  Know your numbers.Ask your health care provider to check your cholesterol and your blood glucose. Continue to have your blood tested as directed by your health care provider. What should I know about cancer screening? There are several types of cancer.  Take the following steps to reduce your risk and to catch any cancer development as early as possible. Breast Cancer  Practice breast self-awareness.  This means understanding how your breasts normally appear and feel.  It also means doing regular breast self-exams. Let your health care  provider know about any changes, no matter how small.  If you are 2 or older, have a clinician do a breast exam (clinical breast exam or CBE) every year. Depending on your age, family history, and medical history, it may be recommended that you also have a yearly breast X-ray (mammogram).  If you have a family history of breast cancer, talk with your health care provider about genetic screening.  If you are at high risk for breast cancer, talk with your health care provider about having an MRI and a mammogram every year.  Breast cancer (BRCA) gene test is recommended for women who have family members with BRCA-related cancers. Results of the assessment will determine the need for genetic counseling and BRCA1 and for BRCA2 testing. BRCA-related cancers include these types:  Breast. This occurs in males or females.  Ovarian.  Tubal. This may also be called fallopian tube cancer.  Cancer of the abdominal or pelvic lining (peritoneal cancer).  Prostate.  Pancreatic. Cervical, Uterine, and Ovarian Cancer  Your health care provider may recommend that you be screened regularly for cancer of the pelvic organs. These include your ovaries, uterus, and vagina. This screening involves a pelvic exam, which includes checking for microscopic changes to the surface of your cervix (Pap test).  For women ages 21-65, health care providers may recommend a pelvic exam and a Pap test every three years. For women ages 39-65, they may recommend the Pap test and pelvic exam, combined with testing for human papilloma virus (HPV), every five years. Some types of HPV increase your risk of cervical cancer. Testing for HPV may also be done on women of any age who have unclear Pap test results.  Other health care providers may not recommend any screening for nonpregnant women who are considered low risk for pelvic cancer and have no symptoms. Ask your health care provider if a screening pelvic exam is right for  you.  If you have had past treatment for cervical cancer or a condition that could lead to cancer, you need Pap tests and screening for cancer for at least 20 years after your treatment. If Pap tests have been discontinued for you, your risk factors (such as having a new sexual partner) need to be reassessed to determine if you should start having screenings again. Some women have medical problems that increase the chance of getting cervical cancer. In these cases, your health care provider may recommend that you have screening and Pap tests more often.  If you have a family history of uterine cancer or ovarian cancer, talk with your health care provider about genetic screening.  If you have vaginal bleeding after reaching menopause, tell your health care provider.  There are currently no reliable tests available to screen for ovarian cancer. Lung Cancer  Lung cancer screening is recommended for adults 22-26 years old who are at high risk for lung cancer because of a history of smoking. A yearly low-dose CT scan of the lungs is recommended if you:  Currently smoke.  Have a history of at least 30 pack-years of smoking and you currently smoke or have quit within the past 15 years. A pack-year is smoking an average of one pack of cigarettes  per day for one year. Yearly screening should:  Continue until it has been 15 years since you quit.  Stop if you develop a health problem that would prevent you from having lung cancer treatment. Colorectal Cancer  This type of cancer can be detected and can often be prevented.  Routine colorectal cancer screening usually begins at age 62 and continues through age 47.  If you have risk factors for colon cancer, your health care provider may recommend that you be screened at an earlier age.  If you have a family history of colorectal cancer, talk with your health care provider about genetic screening.  Your health care provider may also recommend using  home test kits to check for hidden blood in your stool.  A small camera at the end of a tube can be used to examine your colon directly (sigmoidoscopy or colonoscopy). This is done to check for the earliest forms of colorectal cancer.  Direct examination of the colon should be repeated every 5-10 years until age 52. However, if early forms of precancerous polyps or small growths are found or if you have a family history or genetic risk for colorectal cancer, you may need to be screened more often. Skin Cancer  Check your skin from head to toe regularly.  Monitor any moles. Be sure to tell your health care provider:  About any new moles or changes in moles, especially if there is a change in a mole's shape or color.  If you have a mole that is larger than the size of a pencil eraser.  If any of your family members has a history of skin cancer, especially at a young age, talk with your health care provider about genetic screening.  Always use sunscreen. Apply sunscreen liberally and repeatedly throughout the day.  Whenever you are outside, protect yourself by wearing long sleeves, pants, a wide-brimmed hat, and sunglasses. What should I know about osteoporosis? Osteoporosis is a condition in which bone destruction happens more quickly than new bone creation. After menopause, you may be at an increased risk for osteoporosis. To help prevent osteoporosis or the bone fractures that can happen because of osteoporosis, the following is recommended:  If you are 89-1 years old, get at least 1,000 mg of calcium and at least 600 mg of vitamin D per day.  If you are older than age 69 but younger than age 69, get at least 1,200 mg of calcium and at least 600 mg of vitamin D per day.  If you are older than age 12, get at least 1,200 mg of calcium and at least 800 mg of vitamin D per day. Smoking and excessive alcohol intake increase the risk of osteoporosis. Eat foods that are rich in calcium and  vitamin D, and do weight-bearing exercises several times each week as directed by your health care provider. What should I know about how menopause affects my mental health? Depression may occur at any age, but it is more common as you become older. Common symptoms of depression include:  Low or sad mood.  Changes in sleep patterns.  Changes in appetite or eating patterns.  Feeling an overall lack of motivation or enjoyment of activities that you previously enjoyed.  Frequent crying spells. Talk with your health care provider if you think that you are experiencing depression. What should I know about immunizations? It is important that you get and maintain your immunizations. These include:  Tetanus, diphtheria, and pertussis (Tdap) booster vaccine.  Influenza  every year before the flu season begins.  Pneumonia vaccine.  Shingles vaccine. Your health care provider may also recommend other immunizations. This information is not intended to replace advice given to you by your health care provider. Make sure you discuss any questions you have with your health care provider. Document Released: 09/20/2005 Document Revised: 02/16/2016 Document Reviewed: 05/02/2015 Elsevier Interactive Patient Education  2017 [employer].

## 2016-12-18 NOTE — Progress Notes (Signed)
Patient ID: Crystal Kidd, female    DOB: [birth year]  Age: 71 y.o. MRN: [id number]  The patient is here for annual physical  examination and management of other chronic and acute problems.  Mammogram 3d normal April 2017  hig risk  Mri breast needed in October  2018     The risk factors are reflected in the social history.  The roster of all physicians providing medical care to patient - is listed in the Snapshot section of the chart.  Activities of daily living:  The patient is 100% independent in all ADLs: dressing, toileting, feeding as well as independent mobility  Home safety : The patient has smoke detectors in the home. They wear seatbelts.  There are no firearms at home. There is no violence in the home.   There is no risks for hepatitis, STDs or HIV. There is no   history of blood transfusion. They have no travel history to infectious disease endemic areas of the world.  The patient has seen their dentist in the last six month. They have seen their eye doctor in the last year. They admit to slight hearing difficulty with regard to whispered voices and some television programs.  They have deferred audiologic testing in the last year.  They do not  have excessive sun exposure. Discussed the need for sun protection: hats, long sleeves and use of sunscreen if there is significant sun exposure.   Diet: the importance of a healthy diet is discussed. They do have a healthy diet.  The benefits of regular aerobic exercise were discussed. She walks 4 times per week ,  20 minutes.   Depression screen: there are no signs or vegative symptoms of depression- irritability, change in appetite, anhedonia, sadness/tearfullness.  Cognitive assessment: the patient manages all their financial and personal affairs and is actively engaged. They could relate day,date,year and events; recalled 2/3 objects at 3 minutes; performed clock-face test normally.  The following portions of the patient's history  were reviewed and updated as appropriate: allergies, current medications, past family history, past medical history,  past surgical history, past social history  and problem list.  Visual acuity was not assessed per patient preference since she has regular follow up with her ophthalmologist. Hearing and body mass index were assessed and reviewed.   During the course of the visit the patient was educated and counseled about appropriate screening and preventive services including : fall prevention , diabetes screening, nutrition counseling, colorectal cancer screening, and recommended immunizations.    CC: The primary encounter diagnosis was Encounter for preventive health examination. Diagnoses of Acquired hypothyroidism, Hyperlipidemia LDL goal <100, Essential hypertension, and Family history of breast cancer in mother were also pertinent to this visit.   Feels great . Bowel moving regularly 5 out of 7 days per week   Hypothyroidism:  Se is using the 50 mcg tablets , taking 1.5  Daily for 1m cg dose.    taking 2 losartan daily.  Home blood pressures have been < 308 systolic. ,  Taking claritin for allergies and benadryl at night. Sleeping well .  No naps during day    History [name] has a past medical history of Allergic rhinoconjunctivitis; Allergy (1992); Arthritis; Cataract; Diverticulosis; GERD (gastroesophageal reflux disease); Hyperlipidemia; Hypertension; Osteopenia; Thyroid disease; Transfusion history (1968); Trapezius muscle spasm (04/13/2015); and Vitiligo.   She has a past surgical history that includes Rotator cuff repair; Abdominal hysterectomy; Breast surgery; Appendectomy; Esophageal dilation; Colonoscopy; Eye surgery; Fracture surgery ([id number]); Tubal  ligation; Upper gastrointestinal endoscopy; Cholecystectomy (N/A, 04/30/2016); Breast excisional biopsy (Right, 1972); and Breast excisional biopsy (Left, 1980'S).   Her family history includes Breast cancer in her mother and  sister; COPD in her brother, father, and son; Cancer in her mother; Cancer (age of onset: 63) in her sister; Cancer (age of onset: 70) in her maternal aunt; Colon cancer in her maternal aunt; Colon polyps in her brother and sister; Coronary artery disease in her brother; Stomach cancer in her mother.She reports that she has never smoked. She has never used smokeless tobacco. She reports that she drinks alcohol. She reports that she does not use drugs.  Outpatient Medications Prior to Visit  Medication Sig Dispense Refill  . acetaminophen (TYLENOL) 500 MG tablet Take 500 mg by mouth every 6 (six) hours as needed for headache (pain).    . cholecalciferol (VITAMIN D) 1000 units tablet Take 1,000 Units by mouth 2 (two) times daily.    . fluticasone (FLONASE) 50 MCG/ACT nasal spray USE 2 SPRAYS IN EACH NOSTRIL EVERY DAY (Patient taking differently: USE 2 SPRAYS IN EACH NOSTRIL DAILY AS NEEDED FOR CONGESTION) 48 g 6  . gabapentin (NEURONTIN) 100 MG capsule TAKE 1 CAPSULE (100 MG TOTAL) BY MOUTH 3 (THREE) TIMES DAILY. EVERY 8 HOURS AS NEEDED 90 capsule 1  . hydrochlorothiazide (HYDRODIURIL) 25 MG tablet TAKE 1/2 TABLET DAILY AS NEEDED 45 tablet 1  . ibuprofen (ADVIL,MOTRIN) 200 MG tablet Take 200 mg by mouth every 6 (six) hours as needed for moderate pain.     Marland Kitchen levothyroxine (SYNTHROID, LEVOTHROID) 75 MCG tablet Take 1 tablet (75 mcg total) by mouth daily before breakfast. 90 tablet 0  . loratadine (CLARITIN) 10 MG tablet Take 10 mg by mouth daily.    Marland Kitchen omeprazole (PRILOSEC) 40 MG capsule Take 1 capsule (40 mg total) by mouth daily. 90 capsule 2  . Polyethyl Glycol-Propyl Glycol (SYSTANE OP) Place 1 drop into both eyes 4 (four) times daily as needed (dry eyes).    . simvastatin (ZOCOR) 20 MG tablet TAKE 1 TABLET EVERY EVENING 90 tablet 1  . losartan (COZAAR) 50 MG tablet TAKE 1 AND 1/2 TABLETS EVERY DAY (Patient taking differently: TAKE 2 TABLETS EVERY DAY) 135 tablet 1  . benzonatate (TESSALON) 100 MG  capsule Take 2 capsules (200 mg total) by mouth 3 (three) times daily as needed for cough. (Patient not taking: Reported on 11/29/2016) 60 capsule 3   No facility-administered medications prior to visit.     Review of Systems  Objective:  BP 124/78 (BP Location: Left Arm, Patient Position: Sitting, Cuff Size: Normal)   Pulse 77   Temp 98 F (36.7 C) (Oral)   Resp 16   Ht 5\' 3"  (1.6 m)   Wt 142 lb 3.2 oz (64.5 kg)   SpO2 96%   BMI 25.19 kg/m   Physical Exam    Assessment & Plan:   Problem List Items Addressed This Visit    Hypothyroidism    Thyroid function is underactive on alternating 50/ 75 mcg dose.  Increased dose to 75 mcd daily and recheck in 6 weeks  Lab Results  Component Value Date   TSH 10.96 (H) 12/16/2016           Relevant Orders   T4 AND TSH   Hyperlipidemia LDL goal <100    LDL and triglycerides have been at goal on current medications. She  has no side effects and liver enzymes are normal. No changes today.  Lab Results  Component Value Date   CHOL 141 12/16/2016   HDL 44.70 12/16/2016   LDLCALC 75 12/16/2016   LDLDIRECT 79.0 11/30/2015   TRIG 108.0 12/16/2016   CHOLHDL 3 12/16/2016   Lab Results  Component Value Date   ALT 13 12/16/2016   AST 19 12/16/2016   ALKPHOS 81 12/16/2016   BILITOT 0.5 12/16/2016         Relevant Medications   losartan (COZAAR) 100 MG tablet   Family history of breast cancer in mother    Mammogram was normal in April  Mri to be ordered in  [place].[facility]       Essential hypertension    Well controlled on current regimen of losartan 100 mg daily . Renal function stable, no changes today.      Relevant Medications   losartan (COZAAR) 100 MG tablet   Encounter for preventive health examination - Primary    Annual comprehensive preventive exam was done as well as an evaluation and management of chronic conditions .  During the course of the visit the patient was educated and counseled about appropriate screening  and preventive services including :  diabetes screening, lipid analysis with projected  10 year  risk for CAD , nutrition counseling, breast, cervical and colorectal cancer screening, and recommended immunizations.  Printed recommendations for health maintenance screenings was given         I have discontinued Ms. Sandlin's benzonatate. I have also changed her losartan. Additionally, I am having her maintain her loratadine, simvastatin, fluticasone, Polyethyl Glycol-Propyl Glycol (SYSTANE OP), acetaminophen, ibuprofen, hydrochlorothiazide, gabapentin, omeprazole, cholecalciferol, levothyroxine, and calcium citrate-vitamin D.  Meds ordered this encounter  Medications  . calcium citrate-vitamin D (CITRACAL+D) 315-200 MG-UNIT tablet    Sig: Take 1 tablet by mouth 2 (two) times daily.  Marland Kitchen losartan (COZAAR) 100 MG tablet    Sig: Take 1 tablet (100 mg total) by mouth daily.    Dispense:  90 tablet    Refill:  0    Medications Discontinued During This Encounter  Medication Reason  . benzonatate (TESSALON) 100 MG capsule Patient has not taken in last 30 days  . losartan (COZAAR) 50 MG tablet Reorder    Follow-up: Return in about 6 months (around 06/20/2017), or labs due jun 18.   [name], MD

## 2016-12-21 DIAGNOSIS — Z803 Family history of malignant neoplasm of breast: Secondary | ICD-10-CM | POA: Insufficient documentation

## 2016-12-21 NOTE — Assessment & Plan Note (Signed)
Mammogram was normal in April  Mri to be ordered in  [place].[facility]

## 2016-12-21 NOTE — Assessment & Plan Note (Signed)
Annual comprehensive preventive exam was done as well as an evaluation and management of chronic conditions .  During the course of the visit the patient was educated and counseled about appropriate screening and preventive services including :  diabetes screening, lipid analysis with projected  10 year  risk for CAD , nutrition counseling, breast, cervical and colorectal cancer screening, and recommended immunizations.  Printed recommendations for health maintenance screenings was given 

## 2016-12-21 NOTE — Assessment & Plan Note (Signed)
LDL and triglycerides have been at goal on current medications. She  has no side effects and liver enzymes are normal. No changes today.  Lab Results  Component Value Date   CHOL 141 12/16/2016   HDL 44.70 12/16/2016   LDLCALC 75 12/16/2016   LDLDIRECT 79.0 11/30/2015   TRIG 108.0 12/16/2016   CHOLHDL 3 12/16/2016   Lab Results  Component Value Date   ALT 13 12/16/2016   AST 19 12/16/2016   ALKPHOS 81 12/16/2016   BILITOT 0.5 12/16/2016

## 2016-12-21 NOTE — Assessment & Plan Note (Addendum)
Thyroid function is underactive on alternating 50/ 75 mcg dose.  Increased dose to 75 mcd daily and recheck in 6 weeks  Lab Results  Component Value Date   TSH 10.96 (H) 12/16/2016

## 2016-12-21 NOTE — Assessment & Plan Note (Signed)
Well controlled on current regimen of losartan 100 mg daily . Renal function stable, no changes today. 

## 2016-12-24 DIAGNOSIS — H524 Presbyopia: Secondary | ICD-10-CM | POA: Diagnosis not present

## 2016-12-24 DIAGNOSIS — H5213 Myopia, bilateral: Secondary | ICD-10-CM | POA: Diagnosis not present

## 2016-12-24 DIAGNOSIS — H10413 Chronic giant papillary conjunctivitis, bilateral: Secondary | ICD-10-CM | POA: Diagnosis not present

## 2016-12-24 DIAGNOSIS — H26493 Other secondary cataract, bilateral: Secondary | ICD-10-CM | POA: Diagnosis not present

## 2016-12-24 DIAGNOSIS — H04123 Dry eye syndrome of bilateral lacrimal glands: Secondary | ICD-10-CM | POA: Diagnosis not present

## 2016-12-26 DIAGNOSIS — H5213 Myopia, bilateral: Secondary | ICD-10-CM | POA: Diagnosis not present

## 2016-12-26 DIAGNOSIS — H524 Presbyopia: Secondary | ICD-10-CM | POA: Diagnosis not present

## 2016-12-26 DIAGNOSIS — H26491 Other secondary cataract, right eye: Secondary | ICD-10-CM | POA: Diagnosis not present

## 2017-01-13 ENCOUNTER — Other Ambulatory Visit: Payer: Self-pay | Admitting: Internal Medicine

## 2017-01-16 DIAGNOSIS — H26492 Other secondary cataract, left eye: Secondary | ICD-10-CM | POA: Diagnosis not present

## 2017-01-29 ENCOUNTER — Other Ambulatory Visit (INDEPENDENT_AMBULATORY_CARE_PROVIDER_SITE_OTHER): Payer: Medicare HMO

## 2017-01-29 DIAGNOSIS — E039 Hypothyroidism, unspecified: Secondary | ICD-10-CM | POA: Diagnosis not present

## 2017-01-30 ENCOUNTER — Encounter: Payer: Self-pay | Admitting: Internal Medicine

## 2017-01-30 ENCOUNTER — Other Ambulatory Visit: Payer: Self-pay | Admitting: Internal Medicine

## 2017-01-30 LAB — T4 AND TSH
T4, Total: 8.2 ug/dL (ref 4.5–12.0)
TSH: 3.85 u[IU]/mL (ref 0.450–4.500)

## 2017-02-13 ENCOUNTER — Telehealth: Payer: Self-pay | Admitting: *Deleted

## 2017-02-13 DIAGNOSIS — Z1239 Encounter for other screening for malignant neoplasm of breast: Secondary | ICD-10-CM

## 2017-02-13 NOTE — Telephone Encounter (Signed)
Don't see an MRI ordered.

## 2017-02-13 NOTE — Telephone Encounter (Signed)
Patient stated that she was to have a referral for a MRI, pt was requesting a update on this  Pt contact [phone]

## 2017-02-14 NOTE — Telephone Encounter (Signed)
MRI Breast not due until October per last visit .  Ordered

## 2017-02-14 NOTE — Telephone Encounter (Signed)
Spoke with pt and informed her that Dr. Derrel Nip had ordered her breast MRI but that she is not due for it until October. Pt gave a verbal understanding.

## 2017-02-20 ENCOUNTER — Other Ambulatory Visit: Payer: Self-pay | Admitting: Internal Medicine

## 2017-03-28 ENCOUNTER — Other Ambulatory Visit: Payer: Self-pay | Admitting: Internal Medicine

## 2017-04-09 ENCOUNTER — Ambulatory Visit: Payer: Medicare HMO

## 2017-04-10 ENCOUNTER — Ambulatory Visit (INDEPENDENT_AMBULATORY_CARE_PROVIDER_SITE_OTHER): Payer: Medicare HMO

## 2017-04-10 DIAGNOSIS — Z23 Encounter for immunization: Secondary | ICD-10-CM

## 2017-04-25 ENCOUNTER — Other Ambulatory Visit: Payer: Self-pay | Admitting: Internal Medicine

## 2017-05-29 ENCOUNTER — Other Ambulatory Visit: Payer: Self-pay | Admitting: Internal Medicine

## 2017-06-20 ENCOUNTER — Ambulatory Visit: Payer: Medicare HMO | Admitting: Internal Medicine

## 2017-06-20 ENCOUNTER — Encounter: Payer: Self-pay | Admitting: Internal Medicine

## 2017-06-20 VITALS — BP 144/82 | HR 89 | Temp 98.2°F | Resp 16 | Ht 63.0 in | Wt 142.0 lb

## 2017-06-20 DIAGNOSIS — R7301 Impaired fasting glucose: Secondary | ICD-10-CM

## 2017-06-20 DIAGNOSIS — E785 Hyperlipidemia, unspecified: Secondary | ICD-10-CM | POA: Diagnosis not present

## 2017-06-20 DIAGNOSIS — Z1231 Encounter for screening mammogram for malignant neoplasm of breast: Secondary | ICD-10-CM | POA: Diagnosis not present

## 2017-06-20 DIAGNOSIS — Z803 Family history of malignant neoplasm of breast: Secondary | ICD-10-CM

## 2017-06-20 DIAGNOSIS — I10 Essential (primary) hypertension: Secondary | ICD-10-CM

## 2017-06-20 DIAGNOSIS — E034 Atrophy of thyroid (acquired): Secondary | ICD-10-CM | POA: Diagnosis not present

## 2017-06-20 LAB — COMPREHENSIVE METABOLIC PANEL
ALBUMIN: 3.9 g/dL (ref 3.5–5.2)
ALK PHOS: 89 U/L (ref 39–117)
ALT: 15 U/L (ref 0–35)
AST: 20 U/L (ref 0–37)
BUN: 13 mg/dL (ref 6–23)
CO2: 30 mEq/L (ref 19–32)
CREATININE: 0.83 mg/dL (ref 0.40–1.20)
Calcium: 9.7 mg/dL (ref 8.4–10.5)
Chloride: 102 mEq/L (ref 96–112)
GFR: 71.82 mL/min (ref >60.00)
Glucose, Bld: 93 mg/dL (ref 70–99)
Potassium: 4.3 mEq/L (ref 3.5–5.1)
SODIUM: 137 meq/L (ref 135–145)
TOTAL PROTEIN: 6.7 g/dL (ref 6.0–8.3)
Total Bilirubin: 0.5 mg/dL (ref 0.2–1.2)

## 2017-06-20 LAB — HEMOGLOBIN A1C: HEMOGLOBIN A1C: 5.5 % (ref 4.6–6.5)

## 2017-06-20 LAB — TSH: TSH: 2.52 u[IU]/mL (ref 0.35–4.50)

## 2017-06-20 MED ORDER — ZOSTER VAC RECOMB ADJUVANTED 50 MCG/0.5ML IM SUSR: 0.5000 mL | Freq: Once | INTRAMUSCULAR | 1 refills | Status: AC

## 2017-06-20 NOTE — Progress Notes (Signed)
Subjective:  Patient ID: Crystal Kidd, female    DOB: 1946-01-09  Age: 71 y.o. MRN: [id number]   CC: The primary encounter diagnosis was Impaired fasting glucose. Diagnoses of Hypothyroidism due to acquired atrophy of thyroid, Essential hypertension, Hyperlipidemia LDL goal <100, Family history of breast cancer in mother, and Encounter for screening mammogram for malignant neoplasm of breast were also pertinent to this visit.  HPI KIYANA VAZGUEZ presents for 6 MONTH FOLLOW UP ON HYPERTENSION AND HYPERPLIPIDEMIA  Patient is taking her medications as prescribed and notes no adverse effects.  Home BP readings have been done about once per week and are  generally < 130/80 .  She is avoiding added salt in her diet and walking regularly about 3 times per week for exercise . Also participating in a [employer] program AT [facility] .  SOME RIGHT HP PAIN TRANSIENT DURING EXERCISE.  LASTS < 1 HOUR  STILL HAVING SINUS DRAINAGE FROM ALLERGIES,  USING CLARITIN DAY AND BENADRYL AT NIGHT   NEEDS BREAST MRI   SCHEDULED IN January     Outpatient Medications Prior to Visit  Medication Sig Dispense Refill  . acetaminophen (TYLENOL) 500 MG tablet Take 500 mg by mouth every 6 (six) hours as needed for headache (pain).    . calcium citrate-vitamin D (CITRACAL+D) 315-200 MG-UNIT tablet Take 1 tablet by mouth 2 (two) times daily.    . cholecalciferol (VITAMIN D) 1000 units tablet Take 1,000 Units by mouth 2 (two) times daily.    . fluticasone (FLONASE) 50 MCG/ACT nasal spray USE 2 SPRAYS IN EACH NOSTRIL EVERY DAY (Patient taking differently: USE 2 SPRAYS IN EACH NOSTRIL DAILY AS NEEDED FOR CONGESTION) 48 g 6  . gabapentin (NEURONTIN) 100 MG capsule TAKE 1 CAPSULE (100 MG TOTAL) BY MOUTH 3 (THREE) TIMES DAILY. EVERY 8 HOURS AS NEEDED 90 capsule 1  . hydrochlorothiazide (HYDRODIURIL) 25 MG tablet TAKE 1/2 TABLET DAILY AS NEEDED 45 tablet 1  . ibuprofen (ADVIL,MOTRIN) 200 MG tablet Take 200 mg by mouth  every 6 (six) hours as needed for moderate pain.     Marland Kitchen levothyroxine (SYNTHROID, LEVOTHROID) 75 MCG tablet Take 1 tablet (75 mcg total) by mouth daily before breakfast. 90 tablet 0  . loratadine (CLARITIN) 10 MG tablet Take 10 mg by mouth daily.    Marland Kitchen losartan (COZAAR) 100 MG tablet TAKE 1 TABLET EVERY DAY 90 tablet 0  . omeprazole (PRILOSEC) 40 MG capsule TAKE 1 CAPSULE EVERY DAY 90 capsule 2  . Polyethyl Glycol-Propyl Glycol (SYSTANE OP) Place 1 drop into both eyes 4 (four) times daily as needed (dry eyes).    . simvastatin (ZOCOR) 20 MG tablet TAKE 1 TABLET EVERY EVENING 90 tablet 1   No facility-administered medications prior to visit.     Review of Systems;  Patient denies headache, fevers, malaise, unintentional weight loss, skin rash, eye pain, sinus congestion and sinus pain, sore throat, dysphagia,  hemoptysis , cough, dyspnea, wheezing, chest pain, palpitations, orthopnea, edema, abdominal pain, nausea, melena, diarrhea, constipation, flank pain, dysuria, hematuria, urinary  Frequency, nocturia, numbness, tingling, seizures,  Focal weakness, Loss of consciousness,  Tremor, insomnia, depression, anxiety, and suicidal ideation.      Objective:  BP (!) 144/82 (BP Location: Left Arm, Patient Position: Sitting, Cuff Size: Normal)   Pulse 89   Temp 98.2 F (36.8 C) (Oral)   Resp 16   Ht 5\' 3"  (1.6 m)   Wt 142 lb (64.4 kg)   SpO2 94%  BMI 25.15 kg/m   BP Readings from Last 3 Encounters:  06/20/17 (!) 144/82  12/18/16 124/78  11/29/16 136/78    Wt Readings from Last 3 Encounters:  06/20/17 142 lb (64.4 kg)  12/18/16 142 lb 3.2 oz (64.5 kg)  11/29/16 143 lb 6.4 oz (65 kg)    General appearance: alert, cooperative and appears stated age Ears: normal TM's and external ear canals both ears Throat: lips, mucosa, and tongue normal; teeth and gums normal Neck: no adenopathy, no carotid bruit, supple, symmetrical, trachea midline and thyroid not enlarged, symmetric, no  tenderness/mass/nodules Back: symmetric, no curvature. ROM normal. No CVA tenderness. Lungs: clear to auscultation bilaterally Heart: regular rate and rhythm, S1, S2 normal, no murmur, click, rub or gallop Abdomen: soft, non-tender; bowel sounds normal; no masses,  no organomegaly Pulses: 2+ and symmetric Skin: Skin color, texture, turgor normal. No rashes or lesions Lymph nodes: Cervical, supraclavicular, and axillary nodes normal.  Lab Results  Component Value Date   HGBA1C 5.5 06/20/2017   HGBA1C 5.6 08/25/2012   HGBA1C 5.6 05/05/2008    Lab Results  Component Value Date   CREATININE 0.83 06/20/2017   CREATININE 0.83 12/16/2016   CREATININE 0.75 05/14/2016    Lab Results  Component Value Date   WBC 4.9 12/16/2016   HGB 13.6 12/16/2016   HCT 40.4 12/16/2016   PLT 289.0 12/16/2016   GLUCOSE 93 06/20/2017   CHOL 141 12/16/2016   TRIG 108.0 12/16/2016   HDL 44.70 12/16/2016   LDLDIRECT 79.0 11/30/2015   LDLCALC 75 12/16/2016   ALT 15 06/20/2017   AST 20 06/20/2017   NA 137 06/20/2017   K 4.3 06/20/2017   CL 102 06/20/2017   CREATININE 0.83 06/20/2017   BUN 13 06/20/2017   CO2 30 06/20/2017   TSH 2.52 06/20/2017   HGBA1C 5.5 06/20/2017    Mm Screening Breast Tomo Bilateral  Result Date: 12/03/2016 CLINICAL DATA:  Screening. EXAM: 2D DIGITAL SCREENING BILATERAL MAMMOGRAM WITH CAD AND ADJUNCT TOMO COMPARISON:  Previous exam(s). ACR Breast Density Category c: The breast tissue is heterogeneously dense, which may obscure small masses. FINDINGS: There are no findings suspicious for malignancy. Images were processed with CAD. IMPRESSION: No mammographic evidence of malignancy. A result letter of this screening mammogram will be mailed directly to the patient. RECOMMENDATION: Screening mammogram in one year. (Code:SM-B-01Y) BI-RADS CATEGORY  1: Negative. Electronically Signed   By: Fidela Salisbury M.D.   On: 12/03/2016 17:46    Assessment & Plan:   Problem List Items  Addressed This Visit    Essential hypertension    Well controlled on current regimen. Renal function stable, no changes today.  Lab Results  Component Value Date   CREATININE 0.83 06/20/2017   Lab Results  Component Value Date   NA 137 06/20/2017   K 4.3 06/20/2017   CL 102 06/20/2017   CO2 30 06/20/2017         Relevant Orders   Comprehensive metabolic panel (Completed)   Family history of breast cancer in mother    Mammogram was normal in April .  MRI is needed for follow up in January       Hyperlipidemia LDL goal <100    LDL and triglycerides have been at goal on current medications. She  has no side effects and liver enzymes are normal. No changes today.  Lab Results  Component Value Date   CHOL 141 12/16/2016   HDL 44.70 12/16/2016   LDLCALC 75 12/16/2016  LDLDIRECT 79.0 11/30/2015   TRIG 108.0 12/16/2016   CHOLHDL 3 12/16/2016   Lab Results  Component Value Date   ALT 15 06/20/2017   AST 20 06/20/2017   ALKPHOS 89 06/20/2017   BILITOT 0.5 06/20/2017         Hypothyroidism   Relevant Orders   TSH (Completed)    Other Visit Diagnoses    Impaired fasting glucose    -  Primary   Relevant Orders   Hemoglobin A1c (Completed)   Encounter for screening mammogram for malignant neoplasm of breast        A total of 25 minutes of face to face time was spent with patient more than half of which was spent in counselling about the above mentioned conditions  and coordination of care   I am having Tomesha A. Dahm start on Zoster Vaccine Adjuvanted. I am also having her maintain her loratadine, fluticasone, Polyethyl Glycol-Propyl Glycol (SYSTANE OP), acetaminophen, ibuprofen, gabapentin, cholecalciferol, levothyroxine, calcium citrate-vitamin D, hydrochlorothiazide, simvastatin, losartan, and omeprazole.  Meds ordered this encounter  Medications  . Zoster Vaccine Adjuvanted [facility]) injection    Sig: Inject 0.5 mLs once for 1 dose into the muscle.     Dispense:  1 each    Refill:  1    There are no discontinued medications.  Follow-up: Return in about 6 months (around 12/18/2017).   [name], MD

## 2017-06-20 NOTE — Patient Instructions (Addendum)
  You should try [facility]'s Sinus rinse ;  It is a stong sinus "flush" using water and medicated salts.  Do it over the sink because it can be a bit messy  START TAKING THE 1/2 TABLET HCTZ DAILY  AND AFTER ONE WEEK   CHECK BP.  IF NOT 130/80 OR LESS,  LET ME KNOW GOING FORWARD THESE MEDS CAN BE COMBINED INTO ONE PILL   TRY TAKING TEASPOON OF MUSTARD BEFORE GOING TO BED TO PREVENT OR TREAT MUSCLE CRAMPS   WE WILL ORDER YOUR MRI OF BREASTS IN January

## 2017-06-22 ENCOUNTER — Encounter: Payer: Self-pay | Admitting: Internal Medicine

## 2017-06-22 NOTE — Assessment & Plan Note (Signed)
Mammogram was normal in April .  MRI is needed for follow up in January

## 2017-06-22 NOTE — Assessment & Plan Note (Signed)
LDL and triglycerides have been at goal on current medications. She  has no side effects and liver enzymes are normal. No changes today.  Lab Results  Component Value Date   CHOL 141 12/16/2016   HDL 44.70 12/16/2016   LDLCALC 75 12/16/2016   LDLDIRECT 79.0 11/30/2015   TRIG 108.0 12/16/2016   CHOLHDL 3 12/16/2016   Lab Results  Component Value Date   ALT 15 06/20/2017   AST 20 06/20/2017   ALKPHOS 89 06/20/2017   BILITOT 0.5 06/20/2017

## 2017-06-22 NOTE — Assessment & Plan Note (Signed)
Well controlled on current regimen. Renal function stable, no changes today.  Lab Results  Component Value Date   CREATININE 0.83 06/20/2017   Lab Results  Component Value Date   NA 137 06/20/2017   K 4.3 06/20/2017   CL 102 06/20/2017   CO2 30 06/20/2017

## 2017-06-24 ENCOUNTER — Telehealth: Payer: Self-pay

## 2017-06-24 DIAGNOSIS — Z1231 Encounter for screening mammogram for malignant neoplasm of breast: Secondary | ICD-10-CM

## 2017-06-24 DIAGNOSIS — Z803 Family history of malignant neoplasm of breast: Secondary | ICD-10-CM

## 2017-06-24 NOTE — Telephone Encounter (Signed)
MRI has been changed.

## 2017-07-01 ENCOUNTER — Other Ambulatory Visit: Payer: Self-pay | Admitting: Internal Medicine

## 2017-08-01 ENCOUNTER — Other Ambulatory Visit: Payer: Self-pay | Admitting: Internal Medicine

## 2017-08-04 ENCOUNTER — Other Ambulatory Visit: Payer: Self-pay | Admitting: Internal Medicine

## 2017-08-27 ENCOUNTER — Telehealth: Payer: Self-pay | Admitting: Internal Medicine

## 2017-08-27 ENCOUNTER — Other Ambulatory Visit: Payer: Self-pay | Admitting: Internal Medicine

## 2017-08-27 DIAGNOSIS — Z1231 Encounter for screening mammogram for malignant neoplasm of breast: Secondary | ICD-10-CM

## 2017-08-27 NOTE — Telephone Encounter (Signed)
Gave patient the number to schedule her mammogram. Patient stated she will call them right now.

## 2017-08-27 NOTE — Telephone Encounter (Signed)
Copied from [place] [id number]. Topic: Referral - Request >> Aug 27, 2017 10:30 AM Robina Ade, Helene Kelp D wrote: Reason for CRM: Patient would like for the office to schedule her annual mammogram for April. She can not do the week of 12/08/17. Please call patient back, thanks.

## 2017-09-08 ENCOUNTER — Other Ambulatory Visit: Payer: Self-pay | Admitting: Internal Medicine

## 2017-10-29 DIAGNOSIS — D1801 Hemangioma of skin and subcutaneous tissue: Secondary | ICD-10-CM | POA: Diagnosis not present

## 2017-10-29 DIAGNOSIS — L821 Other seborrheic keratosis: Secondary | ICD-10-CM | POA: Diagnosis not present

## 2017-10-29 DIAGNOSIS — I788 Other diseases of capillaries: Secondary | ICD-10-CM | POA: Diagnosis not present

## 2017-10-29 DIAGNOSIS — D225 Melanocytic nevi of trunk: Secondary | ICD-10-CM | POA: Diagnosis not present

## 2017-10-29 DIAGNOSIS — L814 Other melanin hyperpigmentation: Secondary | ICD-10-CM | POA: Diagnosis not present

## 2017-10-29 DIAGNOSIS — D2271 Melanocytic nevi of right lower limb, including hip: Secondary | ICD-10-CM | POA: Diagnosis not present

## 2017-10-29 DIAGNOSIS — L80 Vitiligo: Secondary | ICD-10-CM | POA: Diagnosis not present

## 2017-10-29 DIAGNOSIS — D2272 Melanocytic nevi of left lower limb, including hip: Secondary | ICD-10-CM | POA: Diagnosis not present

## 2017-11-03 ENCOUNTER — Other Ambulatory Visit: Payer: Self-pay | Admitting: Internal Medicine

## 2017-11-03 NOTE — Telephone Encounter (Signed)
Spoke with pt and she stated that she does not take this all the time but that she would like to keep it on hand. Is it okay to refill?

## 2017-11-10 ENCOUNTER — Other Ambulatory Visit: Payer: Self-pay | Admitting: Internal Medicine

## 2017-12-01 ENCOUNTER — Ambulatory Visit (INDEPENDENT_AMBULATORY_CARE_PROVIDER_SITE_OTHER): Payer: Medicare HMO

## 2017-12-01 ENCOUNTER — Ambulatory Visit
Admission: RE | Admit: 2017-12-01 | Discharge: 2017-12-01 | Disposition: A | Discharge status: Home / Self Care | Payer: Medicare HMO | Source: Ambulatory Visit | Attending: Internal Medicine | Admitting: Internal Medicine

## 2017-12-01 VITALS — BP 142/78 | HR 73 | Temp 98.5°F | Resp 14 | Ht 63.0 in | Wt 142.8 lb

## 2017-12-01 DIAGNOSIS — Z1231 Encounter for screening mammogram for malignant neoplasm of breast: Secondary | ICD-10-CM | POA: Diagnosis not present

## 2017-12-01 DIAGNOSIS — Z0000 Encounter for general adult medical examination without abnormal findings: Secondary | ICD-10-CM | POA: Diagnosis not present

## 2017-12-01 NOTE — Progress Notes (Signed)
Subjective:   Crystal Kidd is a 72 y.o. female who presents for Medicare Annual (Subsequent) preventive examination.  Review of Systems:  No ROS.  Medicare Wellness Visit. Additional risk factors are reflected in the social history.  Cardiac Risk Factors include: advanced age (>60men, >64 women);hypertension     Objective:     Vitals: BP (!) 142/78 (BP Location: Left Arm, Patient Position: Sitting, Cuff Size: Normal)   Pulse 73   Temp 98.5 F (36.9 C) (Oral)   Resp 14   Ht 5\' 3"  (1.6 m)   Wt 142 lb 12.8 oz (64.8 kg)   SpO2 96%   BMI 25.30 kg/m   Body mass index is 25.3 kg/m.  Advanced Directives 12/01/2017 11/29/2016 05/04/2016 04/29/2016 12/26/2015 12/25/2015 12/11/2015  Does Patient Have a Medical Advance Directive? Yes Yes No No No;Yes Yes No  Type of Advance Directive Healthcare Power of [place] -  Does patient want to make changes to medical advance directive? No - Patient declined No - Patient declined - - - - -  Copy of [place] in Chart? No - copy requested - - - - - -  Would patient like information on creating a medical advance directive? - - No - patient declined information - No - patient declined information - -    Tobacco Social History   Tobacco Use  Smoking Status Never Smoker  Smokeless Tobacco Never Used     Counseling given: Not Answered   Clinical Intake:  Pre-visit preparation completed: Yes  Pain : No/denies pain     Nutritional Status: BMI 25 -29 Overweight Diabetes: No  How often do you need to have someone help you when you read instructions, pamphlets, or other written materials from your doctor or pharmacy?: 1 - Never  Interpreter Needed?: No     Past Medical History:  Diagnosis Date  . Allergic rhinoconjunctivitis   . Allergy 1992  . Arthritis   . Cataract   . Diverticulosis   . GERD (gastroesophageal reflux  disease)    PMH of esophageal stricture  . Hyperlipidemia   . Hypertension   . Osteopenia    BMD @[facility]   . Thyroid disease    hypothyroidism  . Transfusion history 1968   post partum  . Trapezius muscle spasm 04/13/2015  . Vitiligo    Past Surgical History:  Procedure Laterality Date  . ABDOMINAL HYSTERECTOMY     for dysfunctional menses; USO with TAH  . APPENDECTOMY    . BREAST EXCISIONAL BIOPSY Right 1972   NEG  . BREAST EXCISIONAL BIOPSY Left 1980'S   NEG  . BREAST SURGERY     biopsy X 2  . CHOLECYSTECTOMY N/A 04/30/2016   Procedure: LAPAROSCOPIC CHOLECYSTECTOMY;  Surgeon: Georganna Skeans, MD;  Location: [facility];  Service: General;  Laterality: N/A;  . COLONOSCOPY     X2; Tics; Dr Fuller Plan, GI  . ESOPHAGEAL DILATION     X2  . EYE SURGERY    . FRACTURE SURGERY  2003or 2004  . ROTATOR CUFF REPAIR     R shoulder  . TUBAL LIGATION     befor hysterectomy  . UPPER GASTROINTESTINAL ENDOSCOPY     Family History  Problem Relation Age of Onset  . Breast cancer Sister        mammograms @ [facility]  . Colon polyps Sister   . COPD Father   . Cancer Mother  breast&stomach  . Stomach cancer Mother   . Breast cancer Mother   . Colon cancer Maternal Aunt   . Cancer Maternal Aunt 80       colon cA  . Coronary artery disease Brother   . COPD Brother   . Colon polyps Brother   . COPD Son   . Diabetes Neg Hx   . Stroke Neg Hx   . Heart disease Neg Hx   . Esophageal cancer Neg Hx   . Rectal cancer Neg Hx    Social History   Socioeconomic History  . Marital status: Widowed    Spouse name: Not on file  . Number of children: Not on file  . Years of education: Not on file  . Highest education level: Not on file  Occupational History  . Not on file  Social Needs  . Financial resource strain: Not hard at all  . Food insecurity:    Worry: Never true    Inability: Never true  . Transportation needs:    Medical: No    Non-medical: No  Tobacco Use  . Smoking status: Never  Smoker  . Smokeless tobacco: Never Used  Substance and Sexual Activity  . Alcohol use: Yes    Types: 1 - 6 Glasses of wine per week    Comment: have wine 1 glass every 6 month  . Drug use: No  . Sexual activity: Not Currently  Lifestyle  . Physical activity:    Days per week: 3 days    Minutes per session: 60 min  . Stress: Not at all  Relationships  . Social connections:    Talks on phone: Not on file    Gets together: Not on file    Attends religious service: Not on file    Active member of club or organization: Not on file    Attends meetings of clubs or organizations: Not on file    Relationship status: Not on file  Other Topics Concern  . Not on file  Social History Narrative  . Not on file    Outpatient Encounter Medications as of 12/01/2017  Medication Sig  . acetaminophen (TYLENOL) 500 MG tablet Take 500 mg by mouth every 6 (six) hours as needed for headache (pain).  . benzonatate (TESSALON) 100 MG capsule TAKE 2 CAPSULES (200 MG TOTAL) BY MOUTH 3 (THREE) TIMES DAILY AS NEEDED FOR COUGH.  . calcium citrate-vitamin D (CITRACAL+D) 315-200 MG-UNIT tablet Take 1 tablet by mouth 2 (two) times daily.  . cholecalciferol (VITAMIN D) 1000 units tablet Take 1,000 Units by mouth 2 (two) times daily.  . fluticasone (FLONASE) 50 MCG/ACT nasal spray USE 2 SPRAYS IN EACH NOSTRIL EVERY DAY (Patient taking differently: USE 2 SPRAYS IN EACH NOSTRIL DAILY AS NEEDED FOR CONGESTION)  . gabapentin (NEURONTIN) 100 MG capsule TAKE 1 CAPSULE (100 MG TOTAL) BY MOUTH 3 (THREE) TIMES DAILY. EVERY 8 HOURS AS NEEDED  . hydrochlorothiazide (HYDRODIURIL) 25 MG tablet TAKE 1/2 TABLET DAILY AS NEEDED  . ibuprofen (ADVIL,MOTRIN) 200 MG tablet Take 200 mg by mouth every 6 (six) hours as needed for moderate pain.   Marland Kitchen levothyroxine (SYNTHROID, LEVOTHROID) 75 MCG tablet Take 1 tablet (75 mcg total) by mouth daily before breakfast.  . loratadine (CLARITIN) 10 MG tablet Take 10 mg by mouth daily.  Marland Kitchen losartan  (COZAAR) 100 MG tablet TAKE 1 TABLET EVERY DAY  . omeprazole (PRILOSEC) 40 MG capsule TAKE 1 CAPSULE EVERY DAY  . Polyethyl Glycol-Propyl Glycol (SYSTANE OP) Place  1 drop into both eyes 4 (four) times daily as needed (dry eyes).  . simvastatin (ZOCOR) 20 MG tablet TAKE 1 TABLET EVERY EVENING  . [DISCONTINUED] levothyroxine (SYNTHROID, LEVOTHROID) 50 MCG tablet TAKE 1 TABLET DAILY EXCEPT TAKE 1 AND 1/2 TABLETS ON MONDAY, WEDNESDAY AND FRIDAY AS DIRECTED    No facility-administered encounter medications on file as of 12/01/2017.     Activities of Daily Living In your present state of health, do you have any difficulty performing the following activities: 12/01/2017  Hearing? N  Vision? N  Difficulty concentrating or making decisions? N  Walking or climbing stairs? N  Dressing or bathing? N  Doing errands, shopping? N  Preparing Food and eating ? N  Using the Toilet? N  In the past six months, have you accidently leaked urine? Y  Comment Managed with daily pad  Do you have problems with loss of bowel control? N  Managing your Medications? N  Managing your Finances? N  Housekeeping or managing your Housekeeping? N  Some recent data might be hidden    Patient Care Team: [name], MD as PCP - General (Internal Medicine)    Assessment:   This is a routine wellness examination for Crystal Kidd.  The goal of the wellness visit is to assist the patient how to close the gaps in care and create a preventative care plan for the patient.   The roster of all physicians providing medical care to patient is listed in the Snapshot section of the chart.  Taking calcium VIT D as appropriate/Osteoporosis risk reviewed.    Safety issues reviewed; Smoke and carbon monoxide detectors in the home. No firearms or firearms locked in a safe within the home. Wears seatbelts when driving or riding with others. No violence in the home.  They do not have excessive sun exposure.  Discussed the need for sun  protection: hats, long sleeves and the use of sunscreen if there is significant sun exposure.  Patient is alert, normal appearance, oriented to person/place/and time.  Correctly identified the president of the [place] and recalls of 3/3 words. Performs simple calculations and can read correct time from watch face. Displays appropriate judgement.  No new identified risk were noted.  No failures at ADL's or IADL's.    BMI- discussed the importance of a healthy diet, water intake and the benefits of aerobic exercise. Educational material provided.   24 hour diet recall: Regular diet  Dental- every 6 months.  Eye- Visual acuity not assessed per patient preference since they have regular follow up with the ophthalmologist.  Wears corrective lenses.  Sleep patterns- Sleeps 7-8 hours at night.  Wakes feeling rested.  Health maintenance gaps- closed.  HTN- followed by PCP.  BP reading today 142/78. States she has been out of town for 1 week and did not pack her fluid pill.  She plans to keep a running log and bring to next scheduled appointment.    Patient Concerns: None at this time. Follow up with PCP as needed.  Exercise Activities and Dietary recommendations Current Exercise Habits: Home exercise routine, Type of exercise: walking, Frequency (Times/Week): 3, Intensity: Mild  Goals    . Maintain Healthy Lifestyle     Stay hydrated        Fall Risk Fall Risk  12/01/2017 11/29/2016 08/09/2016 11/30/2015 11/21/2014  Falls in the past year? No No No No No    Depression Screen PHQ 2/9 Scores 12/01/2017 11/29/2016 08/09/2016 11/30/2015  PHQ - 2 Score 0  0 0 0     Cognitive Function MMSE - Mini Mental State Exam 12/01/2017 11/29/2016  Orientation to time 5 5  Orientation to Place 5 5  Registration 3 3  Attention/ Calculation 5 5  Recall 3 3  Language- name 2 objects 2 2  Language- repeat 1 1  Language- follow 3 step command 3 3  Language- read & follow direction 1 1  Write a  sentence 1 1  Copy design 1 1  Total score 30 30        Immunization History  Administered Date(s) Administered  . H1N1 08/31/2008  . Influenza Split 05/22/2011, 04/29/2012  . Influenza Whole 08/12/2002, 05/05/2008, 05/16/2009, 05/21/2010  . Influenza, High Dose Seasonal PF 04/12/2013, 04/09/2016, 04/10/2017  . Influenza,inj,Quad PF,6+ Mos 04/20/2014, 05/02/2015  . Pneumococcal Conjugate-13 06/13/2014  . Pneumococcal Polysaccharide-23 06/21/2010, 11/30/2015  . Td 04/13/2004  . Tdap 05/11/2014  . Zoster 07/26/2009   Screening Tests Health Maintenance  Topic Date Due  . INFLUENZA VACCINE  03/12/2018  . MAMMOGRAM  12/02/2018  . COLONOSCOPY  12/25/2018  . TETANUS/TDAP  05/11/2024  . DEXA SCAN  Completed  . Hepatitis C Screening  Completed  . PNA vac Low Risk Adult  Completed      Plan:    End of life planning; Advance aging; Advanced directives discussed. Copy of current HCPOA/Living Will requested.    I have personally reviewed and noted the following in the patient's chart:   . Medical and social history . Use of alcohol, tobacco or illicit drugs  . Current medications and supplements . Functional ability and status . Nutritional status . Physical activity . Advanced directives . List of other physicians . Hospitalizations, surgeries, and ER visits in previous 12 months . Vitals . Screenings to include cognitive, depression, and falls . Referrals and appointments  In addition, I have reviewed and discussed with patient certain preventive protocols, quality metrics, and best practice recommendations. A written personalized care plan for preventive services as well as general preventive health recommendations were provided to patient.     Varney Biles, LPN  11/08/1914

## 2017-12-01 NOTE — Patient Instructions (Addendum)
  Ms. Duling , Thank you for taking time to come for your Medicare Wellness Visit. I appreciate your ongoing commitment to your health goals. Please review the following plan we discussed and let me know if I can assist you in the future.   Follow up as needed.    Bring a copy of your [place] and/or Living Will to be scanned into chart.  Have a great day!  These are the goals we discussed: Goals    . Maintain Healthy Lifestyle     Stay hydrated        This is a list of the screening recommended for you and due dates:  Health Maintenance  Topic Date Due  . Flu Shot  03/12/2018  . Mammogram  12/02/2018  . Colon Cancer Screening  12/25/2018  . Tetanus Vaccine  05/11/2024  . DEXA scan (bone density measurement)  Completed  .  Hepatitis C: One time screening is recommended by Center for Disease Control  (CDC) for  adults born from [birth year] through 1966.   Completed  . Pneumonia vaccines  Completed

## 2017-12-22 ENCOUNTER — Ambulatory Visit (INDEPENDENT_AMBULATORY_CARE_PROVIDER_SITE_OTHER): Payer: Medicare HMO | Admitting: Internal Medicine

## 2017-12-22 ENCOUNTER — Encounter: Payer: Self-pay | Admitting: Internal Medicine

## 2017-12-22 ENCOUNTER — Other Ambulatory Visit: Payer: Self-pay | Admitting: Internal Medicine

## 2017-12-22 VITALS — BP 118/72 | HR 84 | Temp 98.1°F | Resp 15 | Ht 63.0 in | Wt 139.6 lb

## 2017-12-22 DIAGNOSIS — I10 Essential (primary) hypertension: Secondary | ICD-10-CM | POA: Diagnosis not present

## 2017-12-22 DIAGNOSIS — E559 Vitamin D deficiency, unspecified: Secondary | ICD-10-CM | POA: Diagnosis not present

## 2017-12-22 DIAGNOSIS — E039 Hypothyroidism, unspecified: Secondary | ICD-10-CM

## 2017-12-22 DIAGNOSIS — Z0000 Encounter for general adult medical examination without abnormal findings: Secondary | ICD-10-CM

## 2017-12-22 DIAGNOSIS — I700 Atherosclerosis of aorta: Secondary | ICD-10-CM

## 2017-12-22 DIAGNOSIS — Z803 Family history of malignant neoplasm of breast: Secondary | ICD-10-CM

## 2017-12-22 LAB — COMPREHENSIVE METABOLIC PANEL
ALK PHOS: 93 U/L (ref 39–117)
ALT: 14 U/L (ref 0–35)
AST: 24 U/L (ref 0–37)
Albumin: 4 g/dL (ref 3.5–5.2)
BILIRUBIN TOTAL: 0.4 mg/dL (ref 0.2–1.2)
BUN: 13 mg/dL (ref 6–23)
CO2: 32 meq/L (ref 19–32)
Calcium: 9.5 mg/dL (ref 8.4–10.5)
Chloride: 101 mEq/L (ref 96–112)
Creatinine, Ser: 0.77 mg/dL (ref 0.40–1.20)
GFR: 78.21 mL/min (ref >60.00)
GLUCOSE: 103 mg/dL — AB (ref 70–99)
Potassium: 5.1 mEq/L (ref 3.5–5.1)
SODIUM: 138 meq/L (ref 135–145)
TOTAL PROTEIN: 6.8 g/dL (ref 6.0–8.3)

## 2017-12-22 LAB — VITAMIN D 25 HYDROXY (VIT D DEFICIENCY, FRACTURES): VITD: 40.85 ng/mL (ref 30.00–100.00)

## 2017-12-22 LAB — LIPID PANEL
Cholesterol: 137 mg/dL (ref 0–200)
HDL: 39.9 mg/dL (ref >39.00)
LDL CALC: 59 mg/dL (ref 0–99)
NonHDL: 96.72
TRIGLYCERIDES: 191 mg/dL — AB (ref 0.0–149.0)
Total CHOL/HDL Ratio: 3
VLDL: 38.2 mg/dL (ref 0.0–40.0)

## 2017-12-22 LAB — TSH: TSH: 2.12 u[IU]/mL (ref 0.35–4.50)

## 2017-12-22 NOTE — Assessment & Plan Note (Addendum)
Currently taking 75 mcG DAILY USING 50 MCG TABLETS.    Lab Results  Component Value Date   TSH 2.12 12/22/2017

## 2017-12-22 NOTE — Patient Instructions (Addendum)
You are doing very well!  No medication changes today unless your thyroid function is off again  You can use the mini sweet peppers as your natural  vitamin C source to avoid recurrent mouth ulcers

## 2017-12-22 NOTE — Assessment & Plan Note (Addendum)
Well controlled on current regimen. Renal fyunction stable,  No chags today  Lab Results  Component Value Date   CREATININE 0.77 12/22/2017   Lab Results  Component Value Date   NA 138 12/22/2017   K 5.1 12/22/2017   CL 101 12/22/2017   CO2 32 12/22/2017

## 2017-12-22 NOTE — Assessment & Plan Note (Signed)
Annual comprehensive preventive exam was done as well as an evaluation and management of chronic conditions .  During the course of the visit the patient was educated and counseled about appropriate screening and preventive services including :  diabetes screening, lipid analysis with projected  10 year  risk for CAD , nutrition counseling, breast, cervical and colorectal cancer screening, and recommended immunizations.  Printed recommendations for health maintenance screenings was give 

## 2017-12-22 NOTE — Assessment & Plan Note (Addendum)
Mammogram was normal in April.  [facility] MRI breast as well

## 2017-12-22 NOTE — Progress Notes (Signed)
Patient ID: Crystal Kidd, female    DOB: Aug 27, 1945  Age: 72 y.o. MRN: [id number]  The patient is here for annual preventive  examination and management of other chronic and acute problems.   The risk factors are reflected in the social history.  The roster of all physicians providing medical care to patient - is listed in the Snapshot section of the chart.  Activities of daily living:  The patient is 100% independent in all ADLs: dressing, toileting, feeding as well as independent mobility  Home safety : The patient has smoke detectors in the home. They wear seatbelts.  There are no firearms at home. There is no violence in the home.   There is no risks for hepatitis, STDs or HIV. There is no   history of blood transfusion. They have no travel history to infectious disease endemic areas of the world.  The patient has seen their dentist in the last six month. They have seen their eye doctor in the last year. They admit to slight hearing difficulty with regard to whispered voices and some television programs.  They have deferred audiologic testing in the last year.  They do not  have excessive sun exposure. Discussed the need for sun protection: hats, long sleeves and use of sunscreen if there is significant sun exposure.   Diet: the importance of a healthy diet is discussed. They do have a healthy diet.  The benefits of regular aerobic exercise were discussed. She walks 4 times per week ,  20 minutes.   Depression screen: there are no signs or vegative symptoms of depression- irritability, change in appetite, anhedonia, sadness/tearfullness.  Cognitive assessment: the patient manages all their financial and personal affairs and is actively engaged. They could relate day,date,year and events; recalled 2/3 objects at 3 minutes; performed clock-face test normally.  The following portions of the patient's history were reviewed and updated as appropriate: allergies, current medications, past  family history, past medical history,  past surgical history, past social history  and problem list.  Visual acuity was not assessed per patient preference since she has regular follow up with her ophthalmologist. Hearing and body mass index were assessed and reviewed.   During the course of the visit the patient was educated and counseled about appropriate screening and preventive services including : fall prevention , diabetes screening, nutrition counseling, colorectal cancer screening, and recommended immunizations.    CC: The primary encounter diagnosis was Thoracic aortic atherosclerosis ([place]). Diagnoses of Acquired hypothyroidism, Essential hypertension, Vitamin D deficiency, Encounter for preventive health examination, and Family history of breast cancer in mother were also pertinent to this visit.   follow up on hypertension, hyperlipidemia and hypothyroidism.  Feels great .  Occasional joint pain,  Neck pain managed prn with gabapentin   Take aleve prn when tylenol fails   Hypertension: Home readings  Ave been < 130/80 except for occasional elevations to 140  Using a topical  mouth cream  FOR aphthous ulcers that occure when she eats tangerines but not strawberryies.  Her dentist gave her an OTC cream that helps   Stayn gactive.  Push mows an acre of grass   Weekly  And takes exercise classes twice weekly as well    History Lura has a past medical history of Allergic rhinoconjunctivitis, Allergy (1992), Arthritis, Cataract, Diverticulosis, GERD (gastroesophageal reflux disease), Hyperlipidemia, Hypertension, Osteopenia, Thyroid disease, Transfusion history (1968), Trapezius muscle spasm (04/13/2015), and Vitiligo.   She has a past surgical history that includes Rotator cuff  repair; Abdominal hysterectomy; Breast surgery; Appendectomy; Esophageal dilation; Colonoscopy; Eye surgery; Fracture surgery (1610RU 2004); Tubal ligation; Upper gastrointestinal endoscopy; Cholecystectomy (N/A,  04/30/2016); Breast excisional biopsy (Right, 1972); and Breast excisional biopsy (Left, 1980'S).   Her family history includes Breast cancer in her mother and sister; COPD in her brother, father, and son; Cancer in her mother; Cancer (age of onset: 55) in her maternal aunt; Colon cancer in her maternal aunt; Colon polyps in her brother and sister; Coronary artery disease in her brother; Stomach cancer in her mother.She reports that she has never smoked. She has never used smokeless tobacco. She reports that she drinks alcohol. She reports that she does not use drugs.  Outpatient Medications Prior to Visit  Medication Sig Dispense Refill  . acetaminophen (TYLENOL) 500 MG tablet Take 500 mg by mouth every 6 (six) hours as needed for headache (pain).    . benzonatate (TESSALON) 100 MG capsule TAKE 2 CAPSULES (200 MG TOTAL) BY MOUTH 3 (THREE) TIMES DAILY AS NEEDED FOR COUGH. 60 capsule 2  . calcium citrate-vitamin D (CITRACAL+D) 315-200 MG-UNIT tablet Take 1 tablet by mouth 2 (two) times daily.    . cholecalciferol (VITAMIN D) 1000 units tablet Take 1,000 Units by mouth 2 (two) times daily.    . fluticasone (FLONASE) 50 MCG/ACT nasal spray USE 2 SPRAYS IN EACH NOSTRIL EVERY DAY (Patient taking differently: USE 2 SPRAYS IN EACH NOSTRIL DAILY AS NEEDED FOR CONGESTION) 48 g 6  . gabapentin (NEURONTIN) 100 MG capsule TAKE 1 CAPSULE (100 MG TOTAL) BY MOUTH 3 (THREE) TIMES DAILY. EVERY 8 HOURS AS NEEDED 90 capsule 1  . hydrochlorothiazide (HYDRODIURIL) 25 MG tablet TAKE 1/2 TABLET DAILY AS NEEDED 45 tablet 1  . ibuprofen (ADVIL,MOTRIN) 200 MG tablet Take 200 mg by mouth every 6 (six) hours as needed for moderate pain.     Marland Kitchen levothyroxine (SYNTHROID, LEVOTHROID) 75 MCG tablet Take 1 tablet (75 mcg total) by mouth daily before breakfast. 90 tablet 0  . loratadine (CLARITIN) 10 MG tablet Take 10 mg by mouth daily.    Marland Kitchen losartan (COZAAR) 100 MG tablet TAKE 1 TABLET EVERY DAY 90 tablet 1  . omeprazole (PRILOSEC)  40 MG capsule TAKE 1 CAPSULE EVERY DAY 90 capsule 2  . Polyethyl Glycol-Propyl Glycol (SYSTANE OP) Place 1 drop into both eyes 4 (four) times daily as needed (dry eyes).    . simvastatin (ZOCOR) 20 MG tablet TAKE 1 TABLET EVERY EVENING 90 tablet 1   No facility-administered medications prior to visit.     Review of Systems   Patient denies headache, fevers, malaise, unintentional weight loss, skin rash, eye pain, sinus congestion and sinus pain, sore throat, dysphagia,  hemoptysis , cough, dyspnea, wheezing, chest pain, palpitations, orthopnea, edema, abdominal pain, nausea, melena, diarrhea, constipation, flank pain, dysuria, hematuria, urinary  Frequency, nocturia, numbness, tingling, seizures,  Focal weakness, Loss of consciousness,  Tremor, insomnia, depression, anxiety, and suicidal ideation.      Objective:  BP 118/72 (BP Location: Left Arm, Patient Position: Sitting, Cuff Size: Normal)   Pulse 84   Temp 98.1 F (36.7 C) (Oral)   Resp 15   Ht 5\' 3"  (1.6 m)   Wt 139 lb 9.6 oz (63.3 kg)   SpO2 92%   BMI 24.73 kg/m   Physical Exam   General appearance: alert, cooperative and appears stated age Ears: normal TM's and external ear canals both ears Throat: lips, mucosa, and tongue normal; teeth and gums normal Neck: no adenopathy, no carotid  bruit, supple, symmetrical, trachea midline and thyroid not enlarged, symmetric, no tenderness/mass/nodules Back: symmetric, no curvature. ROM normal. No CVA tenderness. Lungs: clear to auscultation bilaterally Heart: regular rate and rhythm, S1, S2 normal, no murmur, click, rub or gallop Abdomen: soft, non-tender; bowel sounds normal; no masses,  no organomegaly Pulses: 2+ and symmetric Skin: Skin color, texture, turgor normal. No rashes or lesions Lymph nodes: Cervical, supraclavicular, and axillary nodes normal.    Assessment & Plan:   Problem List Items Addressed This Visit    Thoracic aortic atherosclerosis ([place]) - Primary    Relevant Orders   Lipid panel (Completed)   Family history of breast cancer in mother    Mammogram was normal in April.  GETTING ANNUAL MRI breast as well       Encounter for preventive health examination    Annual comprehensive preventive exam was done as well as an evaluation and management of chronic conditions .  During the course of the visit the patient was educated and counseled about appropriate screening and preventive services including :  diabetes screening, lipid analysis with projected  10 year  risk for CAD , nutrition counseling, breast, cervical and colorectal cancer screening, and recommended immunizations.  Printed recommendations for health maintenance screenings was give      Hypothyroidism    Currently taking 75 mcG DAILY USING 50 MCG TABLETS.    Lab Results  Component Value Date   TSH 2.12 12/22/2017         Relevant Orders   TSH (Completed)   Essential hypertension    Well controlled on current regimen. Renal fyunction stable,  No chags today  Lab Results  Component Value Date   CREATININE 0.77 12/22/2017   Lab Results  Component Value Date   NA 138 12/22/2017   K 5.1 12/22/2017   CL 101 12/22/2017   CO2 32 12/22/2017         Relevant Orders   Comprehensive metabolic panel (Completed)    Other Visit Diagnoses    Vitamin D deficiency       Relevant Orders   VITAMIN D 25 Hydroxy (Vit-D Deficiency, Fractures) (Completed)      I am having Rebbie A. Oh maintain her loratadine, fluticasone, Polyethyl Glycol-Propyl Glycol (SYSTANE OP), acetaminophen, ibuprofen, cholecalciferol, levothyroxine, calcium citrate-vitamin D, hydrochlorothiazide, omeprazole, benzonatate, gabapentin, and losartan.  No orders of the defined types were placed in this encounter.   There are no discontinued medications.  Follow-up: Return in about 6 months (around 06/24/2018).   [name], MD

## 2017-12-23 ENCOUNTER — Telehealth: Payer: Self-pay | Admitting: Internal Medicine

## 2017-12-23 NOTE — Telephone Encounter (Unsigned)
Copied from [facility] [id number]. Topic: Quick Communication - See Telephone Encounter >> Dec 23, 2017  2:57 PM Neva Seat wrote: Ubaldo Glassing w/ [facility] [phone] - Phone [phone] - Fax  More information on the MRI request.  Needing last two visit notes w/ Dr. Derrel Nip to start the review. Needing this information is time sensitive.  Please assist..

## 2017-12-23 NOTE — Telephone Encounter (Signed)
Faxed last two office notes for Dr. Derrel Nip  to [facility] with [facility]

## 2017-12-25 ENCOUNTER — Telehealth: Payer: Self-pay | Admitting: *Deleted

## 2017-12-25 NOTE — Telephone Encounter (Signed)
Copied from [place] [id number]. Topic: Inquiry >> Dec 25, 2017 10:21 AM Pricilla Handler wrote: Reason for CRM: Ubaldo Glassing with [facility] [phone]) called stating that this patient could not have two MRIs within 6 Months. Ubaldo Glassing wanted to speak with someone in the office. Please call Alexis at [phone].       Thank You!!!

## 2017-12-26 ENCOUNTER — Other Ambulatory Visit: Payer: Self-pay | Admitting: Internal Medicine

## 2017-12-26 ENCOUNTER — Telehealth: Payer: Self-pay | Admitting: Internal Medicine

## 2017-12-26 DIAGNOSIS — Z803 Family history of malignant neoplasm of breast: Secondary | ICD-10-CM

## 2017-12-26 NOTE — Telephone Encounter (Unsigned)
Copied from [place] [id number]. Topic: Quick Communication - See Telephone Encounter >> Dec 26, 2017  2:07 PM Hewitt Shorts wrote: Dr. Fransico Setters is calling from [facility]  and asked that Dr. Derrel Nip call [phone] to discuss prior authorizarion

## 2017-12-26 NOTE — Telephone Encounter (Signed)
Please advise 

## 2017-12-26 NOTE — Telephone Encounter (Signed)
Crystal Kidd calling back about PA for MRI which needs to be completed by Monday May 20th 2019. The next approval date to have this done is 03/12/2018.   EHO#[id number]

## 2017-12-29 ENCOUNTER — Telehealth: Payer: Self-pay | Admitting: Internal Medicine

## 2017-12-29 NOTE — Telephone Encounter (Signed)
FYI

## 2017-12-29 NOTE — Telephone Encounter (Signed)
Voice mail message left during lunch hour .  Please put me through if he calls back in reference to breast MRI requested on patient with strong FH of breast CA

## 2017-12-29 NOTE — Telephone Encounter (Signed)
LMTCB. PEC may speak with pt.  

## 2017-12-29 NOTE — Telephone Encounter (Signed)
Her insurance is inquiring about whether you had the breast MRI in January 2019.    I have no report that you ever had it done.  Can you confirm?

## 2017-12-29 NOTE — Telephone Encounter (Signed)
Patient said that she called [facility] and they said her last MRI on her breast was 12/21/15

## 2017-12-29 NOTE — Telephone Encounter (Signed)
pt states that she will call [facility] to find out when she had her last MRI of breast

## 2017-12-30 NOTE — Telephone Encounter (Signed)
[employer] contacted and information corrected  authoriziation for MRI forthcoming per Dr Quentin Cornwall

## 2017-12-31 ENCOUNTER — Other Ambulatory Visit: Payer: Self-pay | Admitting: Internal Medicine

## 2018-01-01 ENCOUNTER — Other Ambulatory Visit: Payer: Self-pay | Admitting: Internal Medicine

## 2018-01-01 DIAGNOSIS — Z803 Family history of malignant neoplasm of breast: Secondary | ICD-10-CM

## 2018-01-04 ENCOUNTER — Encounter: Payer: Self-pay | Admitting: Internal Medicine

## 2018-01-06 ENCOUNTER — Encounter: Payer: Self-pay | Admitting: Family Medicine

## 2018-01-06 ENCOUNTER — Ambulatory Visit: Payer: Self-pay | Admitting: Internal Medicine

## 2018-01-06 ENCOUNTER — Ambulatory Visit (INDEPENDENT_AMBULATORY_CARE_PROVIDER_SITE_OTHER): Payer: Medicare HMO | Admitting: Family Medicine

## 2018-01-06 VITALS — BP 150/84 | HR 81 | Temp 98.4°F | Resp 15 | Wt 140.5 lb

## 2018-01-06 DIAGNOSIS — I951 Orthostatic hypotension: Secondary | ICD-10-CM | POA: Diagnosis not present

## 2018-01-06 NOTE — Telephone Encounter (Signed)
Patient called in with c/o "low BP readings." She says "I sent an message to Dr. Derrel Nip about this, but it may be confusing. I was feeling tired, no energy and on Friday, everything went black, I didn't pass out. I checked my BP 102/66, then it dropped in about an hour to 96/61. Around 4 pm on Friday, I took my medications when my BP went up to 119/62. Saturday when I got up my BP was 102/66, then dropped after 1 hour to 95/51. I felt wiped out, no energy. At 11 am it was 118/68, at 2 pm 128/69 and I took my medication. After 2 hours it went down to 116/68. Sunday, everything went black again, no passing out, and my BP was 101/55. After it went up, I took 1/2 of Losartan 100 mg tablet, which 100 mg is ordered. Today my BP is 112/63, I feel fine today and wonder should I go ahead and take 1/2 pill again." I advised I could not make that decision, it is best if she is seen in the office to evaluate her symptoms from this past weekend. She says "ok, that\'s fine. But my BP will be up and it\'s up higher now, probably because I am on the phone and need to relax. I will go ahead and take what I want and not tell you." I asked was her medication changed recently, she says "in November the Losartan was increased from 75 mg to 100 mg, but I didn\'t start taking the 100 mg until May, when my 75 mg ran out. I saw Dr. Tullo this month and she didn\'t change my medication, but I had started the 100 mg. Now I\'m wondering if it needs to be cut back." According to protocol, see PCP within 3 days, no availability with PCP, appointment scheduled for today at 1600 with Julie Kordsmeier, PA, care advice given, patient verbalized understanding.   Reason for Disposition . [1] Fall in systolic BP > 20 mm Hg from normal AND [2] NOT dizzy, lightheaded, or weak  Answer Assessment - Initial Assessment Questions 1. BLOOD PRESSURE: "What is the blood pressure?" "Did you take at least two measurements 5 minutes apart?"     112/63 2. ONSET:  "When did you take your blood pressure?"     This morning 3. HOW: "How did you obtain the blood pressure?" (e.g., visiting nurse, automatic home BP monitor)     Automatic home BP monitor 4. HISTORY: "Do you have a history of low blood pressure?" "What is your blood pressure normally?"     Yes 5. MEDICATIONS: "Are you taking any medications for blood pressure?" If yes: "Have they been changed recently?"     Yes; increased Losartan November, but didn\'t take it until May 6. PULSE RATE: "Do you know what your pulse rate is?"      84  7. OTHER SYMPTOMS: "Have you been sick recently?" "Have you had a recent injury?"     No 8. PREGNANCY: "Is there any chance you are pregnant?" "When was your last menstrual period?"     No  Protocols used: LOW BLOOD PRESSURE-A-AH

## 2018-01-06 NOTE — Progress Notes (Signed)
Subjective:    Patient ID: Crystal Kidd, female    DOB: 04-11-1946, 72 y.o.   MRN: [id number]  HPI   Crystal Kidd is a 72 year old female who presents today with concerns about her blood pressure as she reports having readings that have fluctuated and she noticed low energy, fatigue, and reported an episode of "everything went black." She states that she did not "pass out" during this episode. She reports one additional milder episode that occurred when getting up out of bed quickly. Again, she states that she did not "pass out".  Average blood pressure readings provided by patient from home monitoring are noted as systolic readings ranging from 102 to 130 with one episode of 96. Diastolic readings are noted between 60 to 76. Prior to this episode she reports that she had 4 to 5 loose stools which have not occurred again. Also, she does report working outside doing yard work for an extended period of time in the heat prior to this episode.  She then noticed symptoms mentioned above.   She reports drinking water daily with a focus on drinking 3 to 4  (16 oz). bottles of water a day as she was concerned that she might be dehydrated. She also drinks tea which is caffeinated.  She has not taken HCTZ since this episode however denies adverse effects of HCTZ previously. She has been adherent with losartan daily but did take 1/2 tablet for 2 days to see if symptom would and pressure "level off". She has provided documented blood pressure readings averaging 6 to 8 times daily with some days taking blood pressure more often. She reports doing this as she was trying to determine an accurate reading in response to medication.  Today, she has taken losartan 100 mg and has remained off of HCTZ. She reports taking HCTZ due to BP readings of systolic averages of 732K if taking losartan alone. With Losartan and HCTZ, BP has been well controlled per patient and at last follow up visit.  At home readings of blood  pressure today have ranged from 112/63 to 130/60 prior to this appointment. She did not bring cuff with her.  Review of Systems  Constitutional: Positive for fatigue. Negative for chills and fever.  Respiratory: Negative for cough, shortness of breath and wheezing.   Cardiovascular: Negative for chest pain, palpitations and leg swelling.  Gastrointestinal: Negative for abdominal pain, diarrhea, nausea and vomiting.  Genitourinary: Negative for dysuria.  Skin: Negative for rash.  Neurological: Positive for light-headedness. Negative for dizziness, weakness and headaches.  Psychiatric/Behavioral:       Denies depressed or anxious mood today.   Past Medical History:  Diagnosis Date  . Allergic rhinoconjunctivitis   . Allergy 1992  . Arthritis   . Cataract   . Diverticulosis   . GERD (gastroesophageal reflux disease)    PMH of esophageal stricture  . Hyperlipidemia   . Hypertension   . Osteopenia    BMD @[facility]   . Thyroid disease    hypothyroidism  . Transfusion history 1968   post partum  . Trapezius muscle spasm 04/13/2015  . Vitiligo      Social History   Socioeconomic History  . Marital status: Widowed    Spouse name: Not on file  . Number of children: Not on file  . Years of education: Not on file  . Highest education level: Not on file  Occupational History  . Not on file  Social Needs  . Emergency planning/management officer  strain: Not hard at all  . Food insecurity:    Worry: Never true    Inability: Never true  . Transportation needs:    Medical: No    Non-medical: No  Tobacco Use  . Smoking status: Never Smoker  . Smokeless tobacco: Never Used  Substance and Sexual Activity  . Alcohol use: Yes    Types: 1 - 6 Glasses of wine per week    Comment: have wine 1 glass every 6 month  . Drug use: No  . Sexual activity: Not Currently  Lifestyle  . Physical activity:    Days per week: 3 days    Minutes per session: 60 min  . Stress: Not at all  Relationships  . Social  connections:    Talks on phone: Not on file    Gets together: Not on file    Attends religious service: Not on file    Active member of club or organization: Not on file    Attends meetings of clubs or organizations: Not on file    Relationship status: Not on file  . Intimate partner violence:    Fear of current or ex partner: No    Emotionally abused: No    Physically abused: No    Forced sexual activity: No  Other Topics Concern  . Not on file  Social History Narrative  . Not on file    Past Surgical History:  Procedure Laterality Date  . ABDOMINAL HYSTERECTOMY     for dysfunctional menses; USO with TAH  . APPENDECTOMY    . BREAST EXCISIONAL BIOPSY Right 1972   NEG  . BREAST EXCISIONAL BIOPSY Left 1980'S   NEG  . BREAST SURGERY     biopsy X 2  . CHOLECYSTECTOMY N/A 04/30/2016   Procedure: LAPAROSCOPIC CHOLECYSTECTOMY;  Surgeon: Georganna Skeans, MD;  Location: [facility];  Service: General;  Laterality: N/A;  . COLONOSCOPY     X2; Tics; Dr Fuller Plan, GI  . ESOPHAGEAL DILATION     X2  . EYE SURGERY    . FRACTURE SURGERY  2003or 2004  . ROTATOR CUFF REPAIR     R shoulder  . TUBAL LIGATION     befor hysterectomy  . UPPER GASTROINTESTINAL ENDOSCOPY      Family History  Problem Relation Age of Onset  . Breast cancer Sister        mammograms @ [facility]  . Colon polyps Sister   . COPD Father   . Cancer Mother        breast&stomach  . Stomach cancer Mother   . Breast cancer Mother   . Colon cancer Maternal Aunt   . Cancer Maternal Aunt 80       colon cA  . Coronary artery disease Brother   . COPD Brother   . Colon polyps Brother   . COPD Son   . Diabetes Neg Hx   . Stroke Neg Hx   . Heart disease Neg Hx   . Esophageal cancer Neg Hx   . Rectal cancer Neg Hx     Allergies  Allergen Reactions  . Besivance [Besifloxacin Hcl] Shortness Of Breath and Other (See Comments)    Dizziness  . Alendronate Sodium Other (See Comments)    REACTION: intolerant ( PMH of esophageal  stricture)  . Dilaudid [Hydromorphone Hcl] Itching    Nasal itching; resolution with Benadryl  . Ibandronate Sodium Other (See Comments)     bone pain    Current Outpatient Medications on File  Prior to Visit  Medication Sig Dispense Refill  . acetaminophen (TYLENOL) 500 MG tablet Take 500 mg by mouth every 6 (six) hours as needed for headache (pain).    . benzonatate (TESSALON) 100 MG capsule TAKE 2 CAPSULES (200 MG TOTAL) BY MOUTH 3 (THREE) TIMES DAILY AS NEEDED FOR COUGH. 60 capsule 2  . calcium citrate-vitamin D (CITRACAL+D) 315-200 MG-UNIT tablet Take 1 tablet by mouth 2 (two) times daily.    . cholecalciferol (VITAMIN D) 1000 units tablet Take 1,000 Units by mouth 2 (two) times daily.    . fluticasone (FLONASE) 50 MCG/ACT nasal spray USE 2 SPRAYS IN EACH NOSTRIL EVERY DAY (Patient taking differently: USE 2 SPRAYS IN EACH NOSTRIL DAILY AS NEEDED FOR CONGESTION) 48 g 6  . gabapentin (NEURONTIN) 100 MG capsule TAKE 1 CAPSULE (100 MG TOTAL) BY MOUTH 3 (THREE) TIMES DAILY. EVERY 8 HOURS AS NEEDED 90 capsule 1  . ibuprofen (ADVIL,MOTRIN) 200 MG tablet Take 200 mg by mouth every 6 (six) hours as needed for moderate pain.     Marland Kitchen levothyroxine (SYNTHROID, LEVOTHROID) 75 MCG tablet Take 1 tablet (75 mcg total) by mouth daily before breakfast. 90 tablet 0  . loratadine (CLARITIN) 10 MG tablet Take 10 mg by mouth daily.    Marland Kitchen losartan (COZAAR) 100 MG tablet TAKE 1 TABLET EVERY DAY 90 tablet 1  . omeprazole (PRILOSEC) 40 MG capsule TAKE 1 CAPSULE EVERY DAY 90 capsule 2  . Polyethyl Glycol-Propyl Glycol (SYSTANE OP) Place 1 drop into both eyes 4 (four) times daily as needed (dry eyes).    . simvastatin (ZOCOR) 20 MG tablet TAKE 1 TABLET EVERY EVENING 90 tablet 1  . hydrochlorothiazide (HYDRODIURIL) 25 MG tablet TAKE 1/2 TABLET DAILY AS NEEDED (Patient not taking: Reported on 01/06/2018) 45 tablet 1  . levothyroxine (SYNTHROID, LEVOTHROID) 50 MCG tablet TAKE 1 TABLET DAILY EXCEPT TAKE 1 AND 1/2 TABLETS ON  MONDAY, WEDNESDAY AND FRIDAY AS DIRECTED  (Patient not taking: Reported on 01/06/2018) 108 tablet 1   No current facility-administered medications on file prior to visit.     BP (!) 150/84 (BP Location: Left Arm, Patient Position: Standing, Cuff Size: Normal)   Pulse 81   Temp 98.4 F (36.9 C) (Oral)   Resp 15   Wt 140 lb 8 oz (63.7 kg)   SpO2 95%   BMI 24.89 kg/m        Objective:   Physical Exam  Constitutional: She is oriented to person, place, and time. She appears well-developed and well-nourished.  HENT:  Right Ear: Tympanic membrane normal.  Left Ear: Tympanic membrane normal.  Mouth/Throat: Oropharynx is clear and moist.  Eyes: Pupils are equal, round, and reactive to light. No scleral icterus.  Neck: Neck supple.  Cardiovascular: Normal rate, regular rhythm, normal heart sounds and intact distal pulses.  Pulmonary/Chest: Effort normal and breath sounds normal. She has no wheezes. She has no rales.  Abdominal: Soft. Bowel sounds are normal. There is no tenderness.  Musculoskeletal: She exhibits no edema.  Lymphadenopathy:    She has no cervical adenopathy.  Neurological: She is alert and oriented to person, place, and time.  Skin: Skin is warm and dry. Capillary refill takes less than 2 seconds. No erythema.  Psychiatric: She has a normal mood and affect. Her behavior is normal. Judgment and thought content normal.       Assessment & Plan:  1. Orthostatic hypotension Orthostatic BPs: Sitting: 150/84; Standing: 134/82; Lying: 140//84; Mild orthostatic changes with systolic decrease by  16 mmHG. Symptoms are most consistent with suspected orthostatic changes that occurred after recent exposure to heat outside while working in yard.  Prior episodes mentioned occurred with a combination of working in her yard, having loose stools,  which likely contributed to decrease in fluid volume. Symptoms have improved and she is taking losartan 100 mg as recommended by PCP. She is  also now aware of the importance of maintaining hydration and avoidance of heat exposure for extended periods of time.  Readings from home BP cuff do not correspond with in office readings, so patient was advised to bring cuff into office for next check. She will continue regimen that was prescribed by PCP of Losartan 100 mg and HCTZ 12.5 mg daily. Will obtain BMP today also. Advised patient to monitor BP, document readings, and bring her cuff with her for a follow up BP evaluation in two weeks or sooner if needed. We also reviewed blood pressure monitoring and the need to take BP once daily as we will determine average blood pressure readings for determination of medication changes if needed.  Provided instructions regarding orthostatic changes and preventive measures such as remaining hydrated and rising slowing from bed in the morning. We discussed placing feet on the floor prior to getting up and moving feet from toes to heels 10 times before standing.   - Basic metabolic panel  Delano Metz, FNP-C

## 2018-01-06 NOTE — Patient Instructions (Signed)
Please continue Losartan and one half tablet of your fluid pill.  Document blood pressure readings once daily  Keep hydrated, please drink enough water so that your urine is pale yellow or clear.  If symptoms persist, please follow up with Dr. Derrel Nip for further evaluation.   Orthostatic Hypotension Orthostatic hypotension is a sudden drop in blood pressure that happens when you quickly change positions, such as when you get up from a seated or lying position. Blood pressure is a measurement of how strongly, or weakly, your blood is pressing against the walls of your arteries. Arteries are blood vessels that carry blood from your heart throughout your body. When blood pressure is too low, you may not get enough blood to your brain or to the rest of your organs. This can cause weakness, light-headedness, rapid heartbeat, and fainting. This can last for just a few seconds or for up to a few minutes. Orthostatic hypotension is usually not a serious problem. However, if it happens frequently or gets worse, it may be a sign of something more serious. What are the causes? This condition may be caused by:  Sudden changes in posture, such as standing up quickly after you have been sitting or lying down.  Blood loss.  Loss of body fluids (dehydration).  Heart problems.  Hormone (endocrine) problems.  Pregnancy.  Severe infection.  Lack of certain nutrients.  Severe allergic reactions (anaphylaxis).  Certain medicines, such as blood pressure medicine or medicines that make the body lose excess fluids (diuretics). Sometimes, this condition can be caused by not taking medicine as directed, such as taking too much of a certain medicine.  What increases the risk? Certain factors can make you more likely to develop orthostatic hypotension, including:  Age. Risk increases as you get older.  Conditions that affect the heart or the central nervous system.  Taking certain medicines, such as  blood pressure medicine or diuretics.  Being pregnant.  What are the signs or symptoms? Symptoms of this condition may include:  Weakness.  Light-headedness.  Dizziness.  Blurred vision.  Fatigue.  Rapid heartbeat.  Fainting, in severe cases.  How is this diagnosed? This condition is diagnosed based on:  Your medical history.  Your symptoms.  Your blood pressure measurement. Your health care provider will check your blood pressure when you are: ? Lying down. ? Sitting. ? Standing.  A blood pressure reading is recorded as two numbers, such as "120 over 80" (or 120/80). The first ("top") number is called the systolic pressure. It is a measure of the pressure in your arteries as your heart beats. The second ("bottom") number is called the diastolic pressure. It is a measure of the pressure in your arteries when your heart relaxes between beats. Blood pressure is measured in a unit called mm Hg. Healthy blood pressure for adults is 120/80. If your blood pressure is below 90/60, you may be diagnosed with hypotension. Other information or tests that may be used to diagnose orthostatic hypotension include:  Your other vital signs, such as your heart rate and temperature.  Blood tests.  Tilt table test. For this test, you will be safely secured to a table that moves you from a lying position to an upright position. Your heart rhythm and blood pressure will be monitored during the test.  How is this treated? Treatment for this condition may include:  Changing your diet. This may involve eating more salt (sodium) or drinking more water.  Taking medicines to raise your blood  pressure.  Changing the dosage of certain medicines you are taking that might be lowering your blood pressure.  Wearing compression stockings. These stockings help to prevent blood clots and reduce swelling in your legs.  In some cases, you may need to go to the hospital for:  Fluid replacement. This  means you will receive fluids through an IV tube.  Blood replacement. This means you will receive donated blood through an IV tube (transfusion).  Treating an infection or heart problems, if this applies.  Monitoring. You may need to be monitored while medicines that you are taking wear off.  Follow these instructions at home: Eating and drinking   Drink enough fluid to keep your urine clear or pale yellow.  Eat a healthy diet and follow instructions from your health care provider about eating or drinking restrictions. A healthy diet includes: ? Fresh fruits and vegetables. ? Whole grains. ? Lean meats. ? Low-fat dairy products.  Eat extra salt only as directed. Do not add extra salt to your diet unless your health care provider told you to do that.  Eat frequent, small meals.  Avoid standing up suddenly after eating. Medicines  Take over-the-counter and prescription medicines only as told by your health care provider. ? Follow instructions from your health care provider about changing the dosage of your current medicines, if this applies. ? Do not stop or adjust any of your medicines on your own. General instructions  Wear compression stockings as told by your health care provider.  Get up slowly from lying down or sitting positions. This gives your blood pressure a chance to adjust.  Avoid hot showers and excessive heat as directed by your health care provider.  Return to your normal activities as told by your health care provider. Ask your health care provider what activities are safe for you.  Do not use any products that contain nicotine or tobacco, such as cigarettes and e-cigarettes. If you need help quitting, ask your health care provider.  Keep all follow-up visits as told by your health care provider. This is important. Contact a health care provider if:  You vomit.  You have diarrhea.  You have a fever for more than 2-3 days.  You feel more thirsty than  usual.  You feel weak and tired. Get help right away if:  You have chest pain.  You have a fast or irregular heartbeat.  You develop numbness in any part of your body.  You cannot move your arms or your legs.  You have trouble speaking.  You become sweaty or feel lightheaded.  You faint.  You feel short of breath.  You have trouble staying awake.  You feel confused. This information is not intended to replace advice given to you by your health care provider. Make sure you discuss any questions you have with your health care provider. Document Released: 07/19/2002 Document Revised: 04/16/2016 Document Reviewed: 01/19/2016 Elsevier Interactive Patient Education  2018 [employer].

## 2018-01-06 NOTE — Telephone Encounter (Signed)
FYI seeing at 4:00pm today

## 2018-01-07 LAB — BASIC METABOLIC PANEL
BUN: 12 mg/dL (ref 6–23)
CALCIUM: 9.3 mg/dL (ref 8.4–10.5)
CHLORIDE: 106 meq/L (ref 96–112)
CO2: 30 mEq/L (ref 19–32)
Creatinine, Ser: 0.78 mg/dL (ref 0.40–1.20)
GFR: 77.04 mL/min (ref >60.00)
Glucose, Bld: 93 mg/dL (ref 70–99)
Potassium: 5 mEq/L (ref 3.5–5.1)
Sodium: 141 mEq/L (ref 135–145)

## 2018-01-21 ENCOUNTER — Ambulatory Visit (INDEPENDENT_AMBULATORY_CARE_PROVIDER_SITE_OTHER): Payer: Medicare HMO

## 2018-01-21 VITALS — BP 132/64 | HR 92

## 2018-01-21 DIAGNOSIS — I951 Orthostatic hypotension: Secondary | ICD-10-CM | POA: Diagnosis not present

## 2018-01-21 NOTE — Progress Notes (Addendum)
Patient came in for BP check due to her BP dropping too low.  Patient stated she has not been having lows as of late and had no further issues / sx. BP was taken on left arm. BP-132/64  BPM- 92 Spo2-95     I have reviewed the above information and agree with above.   Deborra Medina, MD

## 2018-02-23 DIAGNOSIS — H04123 Dry eye syndrome of bilateral lacrimal glands: Secondary | ICD-10-CM | POA: Diagnosis not present

## 2018-02-23 DIAGNOSIS — H10413 Chronic giant papillary conjunctivitis, bilateral: Secondary | ICD-10-CM | POA: Diagnosis not present

## 2018-02-23 DIAGNOSIS — Z961 Presence of intraocular lens: Secondary | ICD-10-CM | POA: Diagnosis not present

## 2018-03-30 ENCOUNTER — Other Ambulatory Visit: Payer: Self-pay | Admitting: Internal Medicine

## 2018-04-09 ENCOUNTER — Other Ambulatory Visit: Payer: Self-pay | Admitting: Internal Medicine

## 2018-04-14 ENCOUNTER — Telehealth: Payer: Self-pay

## 2018-04-14 NOTE — Telephone Encounter (Signed)
Copied from [place] [id number]. Topic: Inquiry >> Apr 14, 2018 10:58 AM Rutherford Nail, NT wrote: Reason for CRM: Patient would like a call once the flu shots are in and she is able to get scheduled. Would like to be scheduled before 05/02/18 before she goes out of town.

## 2018-05-19 ENCOUNTER — Ambulatory Visit: Payer: Medicare HMO | Admitting: Family Medicine

## 2018-05-25 ENCOUNTER — Ambulatory Visit (INDEPENDENT_AMBULATORY_CARE_PROVIDER_SITE_OTHER): Payer: Medicare HMO

## 2018-05-25 DIAGNOSIS — Z23 Encounter for immunization: Secondary | ICD-10-CM | POA: Diagnosis not present

## 2018-06-03 ENCOUNTER — Ambulatory Visit
Admission: RE | Admit: 2018-06-03 | Discharge: 2018-06-03 | Disposition: A | Discharge status: Home / Self Care | Payer: Medicare HMO | Source: Ambulatory Visit | Attending: Internal Medicine | Admitting: Internal Medicine

## 2018-06-03 DIAGNOSIS — C50919 Malignant neoplasm of unspecified site of unspecified female breast: Secondary | ICD-10-CM | POA: Diagnosis not present

## 2018-06-03 DIAGNOSIS — Z803 Family history of malignant neoplasm of breast: Secondary | ICD-10-CM

## 2018-06-03 MED ORDER — GADOBENATE DIMEGLUMINE 529 MG/ML IV SOLN
13.0000 mL | Freq: Once | INTRAVENOUS | Status: AC | PRN
  Administered 2018-06-03: 13 mL via INTRAVENOUS

## 2018-06-24 ENCOUNTER — Ambulatory Visit (INDEPENDENT_AMBULATORY_CARE_PROVIDER_SITE_OTHER): Payer: Medicare HMO | Admitting: Internal Medicine

## 2018-06-24 ENCOUNTER — Encounter: Payer: Self-pay | Admitting: Internal Medicine

## 2018-06-24 VITALS — BP 120/80 | HR 81 | Temp 98.1°F | Resp 16 | Ht 63.0 in | Wt 140.1 lb

## 2018-06-24 DIAGNOSIS — I10 Essential (primary) hypertension: Secondary | ICD-10-CM | POA: Diagnosis not present

## 2018-06-24 DIAGNOSIS — Z87898 Personal history of other specified conditions: Secondary | ICD-10-CM

## 2018-06-24 DIAGNOSIS — E034 Atrophy of thyroid (acquired): Secondary | ICD-10-CM | POA: Diagnosis not present

## 2018-06-24 DIAGNOSIS — E785 Hyperlipidemia, unspecified: Secondary | ICD-10-CM | POA: Diagnosis not present

## 2018-06-24 DIAGNOSIS — Z803 Family history of malignant neoplasm of breast: Secondary | ICD-10-CM

## 2018-06-24 LAB — COMPREHENSIVE METABOLIC PANEL
ALT: 12 U/L (ref 0–35)
AST: 17 U/L (ref 0–37)
Albumin: 4.1 g/dL (ref 3.5–5.2)
Alkaline Phosphatase: 93 U/L (ref 39–117)
BUN: 12 mg/dL (ref 6–23)
CALCIUM: 9.7 mg/dL (ref 8.4–10.5)
CHLORIDE: 104 meq/L (ref 96–112)
CO2: 34 meq/L — AB (ref 19–32)
Creatinine, Ser: 0.8 mg/dL (ref 0.40–1.20)
GFR: 74.73 mL/min (ref >60.00)
GLUCOSE: 103 mg/dL — AB (ref 70–99)
Potassium: 4.5 mEq/L (ref 3.5–5.1)
Sodium: 141 mEq/L (ref 135–145)
Total Bilirubin: 0.6 mg/dL (ref 0.2–1.2)
Total Protein: 6.6 g/dL (ref 6.0–8.3)

## 2018-06-24 LAB — LIPID PANEL
CHOL/HDL RATIO: 3
Cholesterol: 139 mg/dL (ref 0–200)
HDL: 45.6 mg/dL (ref >39.00)
LDL CALC: 78 mg/dL (ref 0–99)
NONHDL: 93.76
Triglycerides: 80 mg/dL (ref 0.0–149.0)
VLDL: 16 mg/dL (ref 0.0–40.0)

## 2018-06-24 LAB — TSH: TSH: 2 u[IU]/mL (ref 0.35–4.50)

## 2018-06-24 MED ORDER — ONDANSETRON 4 MG PO TBDP: 4.0000 mg | ORAL_TABLET | Freq: Three times a day (TID) | ORAL | 0 refills | Status: DC | PRN

## 2018-06-24 NOTE — Progress Notes (Signed)
Subjective:  Patient ID: Crystal Kidd, female    DOB: 20-Oct-1945  Age: 72 y.o. MRN: [id number]  CC: The primary encounter diagnosis was Essential hypertension. Diagnoses of Hypothyroidism due to acquired atrophy of thyroid, Hyperlipidemia LDL goal <100, Family history of breast cancer in mother, and History of vertigo were also pertinent to this visit.  HPI Crystal Kidd presents for 6 month follow up on PAD managed with Zocor , and hypothyroidism .  She feels generally well , has a good energy level.  Tolerating medications without side effects.  No falls  , but Had an attack of vertigo  Recently.  Accompanied b y nausea   MRI breast done last week for strong FH of BRCA.  Results were normal  Hypertension: patient checks blood pressure twice weekly at home.  Readings have been for the most part < 140/80 at rest . Patient is following a reduced salt diet most days and is taking medications as prescribed    Outpatient Medications Prior to Visit  Medication Sig Dispense Refill  . acetaminophen (TYLENOL) 500 MG tablet Take 500 mg by mouth every 6 (six) hours as needed for headache (pain).    . benzonatate (TESSALON) 100 MG capsule TAKE 2 CAPSULES (200 MG TOTAL) BY MOUTH 3 (THREE) TIMES DAILY AS NEEDED FOR COUGH. 60 capsule 2  . calcium citrate-vitamin D (CITRACAL+D) 315-200 MG-UNIT tablet Take 1 tablet by mouth 2 (two) times daily.    . cholecalciferol (VITAMIN D) 1000 units tablet Take 1,000 Units by mouth 2 (two) times daily.    . fluticasone (FLONASE) 50 MCG/ACT nasal spray USE 2 SPRAYS IN EACH NOSTRIL EVERY DAY (Patient taking differently: USE 2 SPRAYS IN EACH NOSTRIL DAILY AS NEEDED FOR CONGESTION) 48 g 6  . gabapentin (NEURONTIN) 100 MG capsule TAKE 1 CAPSULE (100 MG TOTAL) BY MOUTH 3 (THREE) TIMES DAILY. EVERY 8 HOURS AS NEEDED 90 capsule 1  . hydrochlorothiazide (HYDRODIURIL) 25 MG tablet TAKE 1/2 TABLET DAILY AS NEEDED 45 tablet 1  . ibuprofen (ADVIL,MOTRIN) 200 MG tablet Take 200  mg by mouth every 6 (six) hours as needed for moderate pain.     Marland Kitchen levothyroxine (SYNTHROID, LEVOTHROID) 75 MCG tablet Take 1 tablet (75 mcg total) by mouth daily before breakfast. 90 tablet 0  . loratadine (CLARITIN) 10 MG tablet Take 10 mg by mouth daily.    Marland Kitchen losartan (COZAAR) 100 MG tablet TAKE 1 TABLET EVERY DAY 90 tablet 1  . omeprazole (PRILOSEC) 40 MG capsule TAKE 1 CAPSULE EVERY DAY 90 capsule 2  . Polyethyl Glycol-Propyl Glycol (SYSTANE OP) Place 1 drop into both eyes 4 (four) times daily as needed (dry eyes).    . simvastatin (ZOCOR) 20 MG tablet TAKE 1 TABLET EVERY EVENING 90 tablet 1  . levothyroxine (SYNTHROID, LEVOTHROID) 50 MCG tablet TAKE 1 TABLET DAILY EXCEPT TAKE 1 AND 1/2 TABLETS ON MONDAY, WEDNESDAY AND FRIDAY AS DIRECTED  (Patient not taking: Reported on 06/24/2018) 108 tablet 1   No facility-administered medications prior to visit.     Review of Systems;  Patient denies headache, fevers, malaise, unintentional weight loss, skin rash, eye pain, sinus congestion and sinus pain, sore throat, dysphagia,  hemoptysis , cough, dyspnea, wheezing, chest pain, palpitations, orthopnea, edema, abdominal pain, nausea, melena, diarrhea, constipation, flank pain, dysuria, hematuria, urinary  Frequency, nocturia, numbness, tingling, seizures,  Focal weakness, Loss of consciousness,  Tremor, insomnia, depression, anxiety, and suicidal ideation.      Objective:  BP 120/80 (BP Location: Left  Arm, Patient Position: Sitting, Cuff Size: Normal)   Pulse 81   Temp 98.1 F (36.7 C) (Oral)   Resp 16   Ht '5\' 3"'  (1.6 m)   Wt 140 lb 1.9 oz (63.6 kg)   SpO2 90%   BMI 24.82 kg/m   BP Readings from Last 3 Encounters:  06/24/18 120/80  01/21/18 132/64  01/06/18 (!) 150/84    Wt Readings from Last 3 Encounters:  06/24/18 140 lb 1.9 oz (63.6 kg)  01/06/18 140 lb 8 oz (63.7 kg)  12/22/17 139 lb 9.6 oz (63.3 kg)    General appearance: alert, cooperative and appears stated age Ears:  normal TM's and external ear canals both ears Throat: lips, mucosa, and tongue normal; teeth and gums normal Neck: no adenopathy, no carotid bruit, supple, symmetrical, trachea midline and thyroid not enlarged, symmetric, no tenderness/mass/nodules Back: symmetric, no curvature. ROM normal. No CVA tenderness. Lungs: clear to auscultation bilaterally Heart: regular rate and rhythm, S1, S2 normal, no murmur, click, rub or gallop Abdomen: soft, non-tender; bowel sounds normal; no masses,  no organomegaly Pulses: 2+ and symmetric Skin: Skin color, texture, turgor normal. No rashes or lesions  Lab Results  Component Value Date   HGBA1C 5.5 06/20/2017   HGBA1C 5.6 08/25/2012   HGBA1C 5.6 05/05/2008    Lab Results  Component Value Date   CREATININE 0.80 06/24/2018   CREATININE 0.78 01/06/2018   CREATININE 0.77 12/22/2017    Lab Results  Component Value Date   WBC 4.9 12/16/2016   HGB 13.6 12/16/2016   HCT 40.4 12/16/2016   PLT 289.0 12/16/2016   GLUCOSE 103 (H) 06/24/2018   CHOL 139 06/24/2018   TRIG 80.0 06/24/2018   HDL 45.60 06/24/2018   LDLDIRECT 79.0 11/30/2015   LDLCALC 78 06/24/2018   ALT 12 06/24/2018   AST 17 06/24/2018   NA 141 06/24/2018   K 4.5 06/24/2018   CL 104 06/24/2018   CREATININE 0.80 06/24/2018   BUN 12 06/24/2018   CO2 34 (H) 06/24/2018   TSH 2.00 06/24/2018   HGBA1C 5.5 06/20/2017    Mr Breast Bilateral W Wo Contrast Inc Cad  Result Date: 06/03/2018 CLINICAL DATA:  Mother diagnosed with breast cancer at the age of 26. Sister diagnosed at the age of 97. Two benign biopsies. LABS:  Creatinine 0.9.  GFR 64. EXAM: BILATERAL BREAST MRI WITH AND WITHOUT CONTRAST TECHNIQUE: Multiplanar, multisequence MR images of both breasts were obtained prior to and following the intravenous administration of 13 ml of MultiHance. Three-dimensional MR images were rendered by post-processing of the original MR data on an independent workstation. The three-dimensional MR  images were interpreted, and findings are reported in the following complete MRI report for this study. Three dimensional images were evaluated at the independent DynaCad workstation COMPARISON:  Previous exam(s). FINDINGS: Breast composition: b. Scattered fibroglandular tissue. Background parenchymal enhancement: Mild Right breast: No mass or abnormal enhancement. Left breast: No mass or abnormal enhancement. Lymph nodes: No abnormal appearing lymph nodes. Ancillary findings:  None. IMPRESSION: No MRI evidence of malignancy. RECOMMENDATION: Annual screening mammography. If the patient's lifetime risk of breast cancer is greater than 20%, recommend annual breast MRI. BI-RADS CATEGORY  2: Benign. Electronically Signed   By: Dorise Bullion III M.D   On: 06/03/2018 15:02    Assessment & Plan:   Problem List Items Addressed This Visit    Essential hypertension - Primary    Well controlled on current regimen. Renal function stable, no changes today.  Lab Results  Component Value Date   CREATININE 0.80 06/24/2018   Lab Results  Component Value Date   NA 141 06/24/2018   K 4.5 06/24/2018   CL 104 06/24/2018   CO2 34 (H) 06/24/2018         Relevant Orders   Comprehensive metabolic panel (Completed)   Family history of breast cancer in mother    Mammograms have been normal annually and MRI of breast was done last week       History of vertigo    zofran given for use in future episodes       Hyperlipidemia LDL goal <100    LDL and triglycerides have been at goal on current medications. She  has no side effects and liver enzymes are normal. No changes today.  Lab Results  Component Value Date   CHOL 139 06/24/2018   HDL 45.60 06/24/2018   LDLCALC 78 06/24/2018   LDLDIRECT 79.0 11/30/2015   TRIG 80.0 06/24/2018   CHOLHDL 3 06/24/2018   Lab Results  Component Value Date   ALT 12 06/24/2018   AST 17 06/24/2018   ALKPHOS 93 06/24/2018   BILITOT 0.6 06/24/2018         Relevant  Orders   Lipid panel (Completed)   Hypothyroidism    Thyroid function is WNL on current dose.  No current changes needed.   Lab Results  Component Value Date   TSH 2.00 06/24/2018         Relevant Orders   TSH (Completed)      I am having Crystal Kidd start on ondansetron. I am also having her maintain her loratadine, fluticasone, Polyethyl Glycol-Propyl Glycol (SYSTANE OP), acetaminophen, ibuprofen, cholecalciferol, levothyroxine, calcium citrate-vitamin D, hydrochlorothiazide, benzonatate, simvastatin, omeprazole, losartan, and gabapentin.  Meds ordered this encounter  Medications  . ondansetron (ZOFRAN ODT) 4 MG disintegrating tablet    Sig: Take 1 tablet (4 mg total) by mouth every 8 (eight) hours as needed for nausea or vomiting.    Dispense:  20 tablet    Refill:  0    Medications Discontinued During This Encounter  Medication Reason  . levothyroxine (SYNTHROID, LEVOTHROID) 50 MCG tablet Error    Follow-up: Return in about 6 months (around 12/23/2018) for CPE.   [name], MD

## 2018-06-24 NOTE — Patient Instructions (Addendum)
[facility] will not allow [place] to give you a Shingles vaccine ;  Even if you have a letter stating that your insurance will pay for it.  I have prescribed generic Zofran to use under the tongue next time you have an attack of vertigo (for the nausea)  If you have an illness that results in diarrhea or vomiting,  Suspend your hctz until you are back to eating and drinking normally

## 2018-06-26 DIAGNOSIS — Z87898 Personal history of other specified conditions: Secondary | ICD-10-CM | POA: Insufficient documentation

## 2018-06-26 NOTE — Assessment & Plan Note (Signed)
Mammograms have been normal annually and MRI of breast was done last week

## 2018-06-26 NOTE — Assessment & Plan Note (Signed)
LDL and triglycerides have been at goal on current medications. She  has no side effects and liver enzymes are normal. No changes today.  Lab Results  Component Value Date   CHOL 139 06/24/2018   HDL 45.60 06/24/2018   LDLCALC 78 06/24/2018   LDLDIRECT 79.0 11/30/2015   TRIG 80.0 06/24/2018   CHOLHDL 3 06/24/2018   Lab Results  Component Value Date   ALT 12 06/24/2018   AST 17 06/24/2018   ALKPHOS 93 06/24/2018   BILITOT 0.6 06/24/2018

## 2018-06-26 NOTE — Assessment & Plan Note (Signed)
Thyroid function is WNL on current dose.  No current changes needed.   Lab Results  Component Value Date   TSH 2.00 06/24/2018

## 2018-06-26 NOTE — Assessment & Plan Note (Signed)
Well controlled on current regimen. Renal function stable, no changes today.  Lab Results  Component Value Date   CREATININE 0.80 06/24/2018   Lab Results  Component Value Date   NA 141 06/24/2018   K 4.5 06/24/2018   CL 104 06/24/2018   CO2 34 (H) 06/24/2018

## 2018-06-26 NOTE — Assessment & Plan Note (Signed)
zofran given for use in future episodes

## 2018-07-30 ENCOUNTER — Other Ambulatory Visit: Payer: Self-pay

## 2018-07-30 MED ORDER — FLUTICASONE PROPIONATE 50 MCG/ACT NA SUSP: 2.0000 | Freq: Every day | NASAL | 6 refills | Status: DC

## 2018-07-31 ENCOUNTER — Other Ambulatory Visit: Payer: Self-pay | Admitting: Internal Medicine

## 2018-07-31 MED ORDER — FLUTICASONE PROPIONATE 50 MCG/ACT NA SUSP: 2.0000 | Freq: Every day | NASAL | 6 refills | Status: DC

## 2018-07-31 NOTE — Telephone Encounter (Signed)
Copied from [place] [phone]. Topic: Quick Communication - Rx Refill/Question >> Jul 31, 2018  8:45 AM Crystal Kidd wrote: Medication: fluticasone (FLONASE) 50 MCG/ACT nasal spray  Has the patient contacted their pharmacy? Yes.  [place] sent in a request and the request was filled with [facility]. Pt needs request filled with [employer].   Preferred Pharmacy (with phone number or street name): [facility], [place] [phone] (Phone) [phone] (Fax)    Agent: Please be advised that RX refills may take up to 3 business days. We ask that you follow-up with your pharmacy.

## 2018-07-31 NOTE — Telephone Encounter (Signed)
Requested Prescriptions  Pending Prescriptions Disp Refills  . fluticasone (FLONASE) 50 MCG/ACT nasal spray 48 g 6    Sig: Place 2 sprays into both nostrils daily.     Ear, Nose, and Throat: Nasal Preparations - Corticosteroids Passed - 07/31/2018  9:53 AM      Passed - Valid encounter within last 12 months    Recent Outpatient Visits          1 month ago Essential hypertension   [place], [place]   6 months ago Orthostatic hypotension   [facility] [place], [place], [place]   7 months ago Thoracic aortic atherosclerosis [facility])   [facility] [name], MD   1 year ago Impaired fasting glucose   [facility] [place] [name], MD   1 year ago Encounter for preventive health examination   [place] [name], MD      Future Appointments            In 4 months Derrel Nip, Aris Everts, MD [place], [place]   In 4 months O'Brien-Blaney, Bryson Corona, LPN [place], [place]

## 2018-08-10 ENCOUNTER — Other Ambulatory Visit: Payer: Self-pay

## 2018-08-10 ENCOUNTER — Other Ambulatory Visit: Payer: Self-pay | Admitting: Internal Medicine

## 2018-08-10 MED ORDER — LEVOTHYROXINE SODIUM 50 MCG PO TABS: ORAL_TABLET | ORAL | 0 refills | Status: DC

## 2018-08-10 NOTE — Telephone Encounter (Signed)
Copied from [place] [phone]. Topic: Quick Communication - See Telephone Encounter >> Aug 10, 2018 10:05 AM Vernona Rieger wrote: CRM for notification. See Telephone encounter for: 08/10/18.  Patient states that she needs 3 month supply of levothyroxine (SYNTHROID, LEVOTHROID) 75 MCG tablet sent to [facility] but is currently out and needs about 2 weeks worth [facility] [address]. Please Advise.

## 2018-11-19 ENCOUNTER — Other Ambulatory Visit: Payer: Self-pay | Admitting: Internal Medicine

## 2018-11-19 DIAGNOSIS — Z1231 Encounter for screening mammogram for malignant neoplasm of breast: Secondary | ICD-10-CM

## 2018-12-04 ENCOUNTER — Other Ambulatory Visit: Payer: Self-pay | Admitting: Internal Medicine

## 2018-12-09 ENCOUNTER — Other Ambulatory Visit: Payer: Self-pay

## 2018-12-09 ENCOUNTER — Ambulatory Visit (INDEPENDENT_AMBULATORY_CARE_PROVIDER_SITE_OTHER): Payer: Medicare HMO

## 2018-12-09 DIAGNOSIS — Z0000 Encounter for general adult medical examination without abnormal findings: Secondary | ICD-10-CM

## 2018-12-09 NOTE — Patient Instructions (Addendum)
  These are the goals we discussed: Goals      Patient Stated   . Advanced Care Planning to complete  (pt-stated)       This is a list of the screening recommended for you and due dates:  Health Maintenance  Topic Date Due  . Mammogram  12/02/2018  . Colon Cancer Screening  12/25/2018  . Flu Shot  03/13/2019  . Tetanus Vaccine  05/11/2024  . DEXA scan (bone density measurement)  Completed  .  Hepatitis C: One time screening is recommended by Center for Disease Control  (CDC) for  adults born from [birth year] through 1966.   Completed  . Pneumonia vaccines  Completed    It was nice to visit with you!  Thank you for taking time to complete your Medicare Wellness Visit.  I appreciate your ongoing commitment to your health goals.   Follow up with your doctor as needed and keep all routine scheduled maintenance appointments.  I am looking forward to seeing you again this time next year!

## 2018-12-09 NOTE — Progress Notes (Addendum)
Subjective:   Crystal Kidd is a 73 y.o. female who presents for Medicare Annual (Subsequent) preventive examination.  Review of Systems:  No ROS.  Medicare Wellness Virtual Visit.  UTA vital signs.  Additional risk factors are reflected in the social history.  Cardiac Risk Factors include: advanced age (>80men, >68 women);hypertension     Objective:     Vitals: There were no vitals taken for this visit.  There is no height or weight on file to calculate BMI.  Advanced Directives 12/09/2018 12/01/2017 11/29/2016 05/04/2016 04/29/2016 12/26/2015 12/25/2015  Does Patient Have a Medical Advance Directive? No Yes Yes No No No;Yes Yes  Type of Advance Directive - Healthcare Power of [place]  Does patient want to make changes to medical advance directive? - No - Patient declined No - Patient declined - - - -  Copy of [place] in Chart? - No - copy requested - - - - -  Would patient like information on creating a medical advance directive? No - Patient declined - - No - patient declined information - No - patient declined information -    Tobacco Social History   Tobacco Use  Smoking Status Never Smoker  Smokeless Tobacco Never Used     Counseling given: Not Answered   Clinical Intake:  Pre-visit preparation completed: Yes        Diabetes: No  How often do you need to have someone help you when you read instructions, pamphlets, or other written materials from your doctor or pharmacy?: 1 - Never  Interpreter Needed?: No     Past Medical History:  Diagnosis Date  . Allergic rhinoconjunctivitis   . Allergy 1992  . Arthritis   . Cataract   . Diverticulosis   . GERD (gastroesophageal reflux disease)    PMH of esophageal stricture  . Hyperlipidemia   . Hypertension   . Osteopenia    BMD @[facility]   . Thyroid disease    hypothyroidism  . Transfusion history 1968    post partum  . Trapezius muscle spasm 04/13/2015  . Vitiligo    Past Surgical History:  Procedure Laterality Date  . ABDOMINAL HYSTERECTOMY     for dysfunctional menses; USO with TAH  . APPENDECTOMY    . BREAST EXCISIONAL BIOPSY Right 1972   NEG  . BREAST EXCISIONAL BIOPSY Left 1980'S   NEG  . BREAST SURGERY     biopsy X 2  . CHOLECYSTECTOMY N/A 04/30/2016   Procedure: LAPAROSCOPIC CHOLECYSTECTOMY;  Surgeon: Georganna Skeans, MD;  Location: [facility];  Service: General;  Laterality: N/A;  . COLONOSCOPY     X2; Tics; Dr Fuller Plan, GI  . ESOPHAGEAL DILATION     X2  . EYE SURGERY    . FRACTURE SURGERY  2003or 2004  . ROTATOR CUFF REPAIR     R shoulder  . TUBAL LIGATION     befor hysterectomy  . UPPER GASTROINTESTINAL ENDOSCOPY     Family History  Problem Relation Age of Onset  . Breast cancer Sister        mammograms @ [facility]  . Colon polyps Sister   . COPD Father   . Cancer Mother        breast&stomach  . Stomach cancer Mother   . Breast cancer Mother   . Colon cancer Maternal Aunt   . Cancer Maternal Aunt 80       colon cA  .  Coronary artery disease Brother   . COPD Brother   . Colon polyps Brother   . COPD Son   . Diabetes Neg Hx   . Stroke Neg Hx   . Heart disease Neg Hx   . Esophageal cancer Neg Hx   . Rectal cancer Neg Hx    Social History   Socioeconomic History  . Marital status: Widowed    Spouse name: Not on file  . Number of children: Not on file  . Years of education: Not on file  . Highest education level: Not on file  Occupational History  . Not on file  Social Needs  . Financial resource strain: Not hard at all  . Food insecurity:    Worry: Never true    Inability: Never true  . Transportation needs:    Medical: No    Non-medical: No  Tobacco Use  . Smoking status: Never Smoker  . Smokeless tobacco: Never Used  Substance and Sexual Activity  . Alcohol use: Yes    Alcohol/week: 1.0 - 6.0 standard drinks    Types: 1 - 6 Glasses of wine per  week    Comment: have wine 1 glass every 6 month  . Drug use: No  . Sexual activity: Not Currently  Lifestyle  . Physical activity:    Days per week: 3 days    Minutes per session: 60 min  . Stress: Not at all  Relationships  . Social connections:    Talks on phone: Not on file    Gets together: Not on file    Attends religious service: Not on file    Active member of club or organization: Not on file    Attends meetings of clubs or organizations: Not on file    Relationship status: Not on file  Other Topics Concern  . Not on file  Social History Narrative  . Not on file    Outpatient Encounter Medications as of 12/09/2018  Medication Sig  . acetaminophen (TYLENOL) 500 MG tablet Take 500 mg by mouth every 6 (six) hours as needed for headache (pain).  . benzonatate (TESSALON) 100 MG capsule TAKE 2 CAPSULES (200 MG TOTAL) BY MOUTH 3 (THREE) TIMES DAILY AS NEEDED FOR COUGH.  . calcium citrate-vitamin D (CITRACAL+D) 315-200 MG-UNIT tablet Take 1 tablet by mouth 2 (two) times daily.  . cholecalciferol (VITAMIN D) 1000 units tablet Take 1,000 Units by mouth 2 (two) times daily.  Mariane Baumgarten Sodium (COLACE PO) Take 1 tablet by mouth every other day.  . fluticasone (FLONASE) 50 MCG/ACT nasal spray Place 2 sprays into both nostrils daily.  Marland Kitchen gabapentin (NEURONTIN) 100 MG capsule TAKE 1 CAPSULE (100 MG TOTAL) BY MOUTH 3 (THREE) TIMES DAILY. EVERY 8 HOURS AS NEEDED  . hydrochlorothiazide (HYDRODIURIL) 25 MG tablet TAKE 1/2 TABLET DAILY AS NEEDED  . ibuprofen (ADVIL,MOTRIN) 200 MG tablet Take 200 mg by mouth every 6 (six) hours as needed for moderate pain.   Marland Kitchen levothyroxine (SYNTHROID, LEVOTHROID) 75 MCG tablet Take 1 tablet (75 mcg total) by mouth daily before breakfast.  . loratadine (CLARITIN) 10 MG tablet Take 10 mg by mouth daily.  Marland Kitchen losartan (COZAAR) 100 MG tablet TAKE 1 TABLET EVERY DAY  . omeprazole (PRILOSEC) 40 MG capsule TAKE 1 CAPSULE EVERY DAY  . ondansetron (ZOFRAN ODT) 4 MG  disintegrating tablet Take 1 tablet (4 mg total) by mouth every 8 (eight) hours as needed for nausea or vomiting.  Vladimir Faster Glycol-Propyl Glycol (SYSTANE OP) Place 1  drop into both eyes 4 (four) times daily as needed (dry eyes).  . simvastatin (ZOCOR) 20 MG tablet TAKE 1 TABLET EVERY EVENING  . [DISCONTINUED] levothyroxine (SYNTHROID) 50 MCG tablet TAKE 1 TABLET DAILY EXCEPT TAKE 1 AND 1/2 TABLETS ON MONDAY, WEDNESDAY AND FRIDAY AS DIRECTED    No facility-administered encounter medications on file as of 12/09/2018.     Activities of Daily Living In your present state of health, do you have any difficulty performing the following activities: 12/09/2018  Hearing? N  Vision? N  Difficulty concentrating or making decisions? N  Walking or climbing stairs? N  Dressing or bathing? N  Doing errands, shopping? N  Preparing Food and eating ? N  Using the Toilet? N  In the past six months, have you accidently leaked urine? Y  Comment Managed with daily pad  Do you have problems with loss of bowel control? N  Managing your Medications? N  Managing your Finances? N  Housekeeping or managing your Housekeeping? N  Some recent data might be hidden    Patient Care Team: [name], MD as PCP - General (Internal Medicine)    Assessment:   This is a routine wellness examination for [place].  I connected with patient 12/09/18 at  1:30 PM EDT by a video enabled telemedicine application and verified that I am speaking with the correct person using two identifiers. Patient stated full name and DOB. Patient gave permission to continue with virtual visit. Patient's location was at home and Nurse's location was at [place] office. Virtual  failed during visit, completed with telephone.  Health Screenings  Mammogram -scheduled 01/27/19 Colonoscopy - 12/25/15 Bone Density -12/26/15 Glaucoma -none Hearing -demonstrates normal hearing during visit. Hemoglobin A1C - 06/20/18 (5.5) Cholesterol - 06/24/18  (139) TSH -06/24/18 (2.00) Dental- UTD Vision- annual visits  Social  Alcohol intake -  yes, rare Smoking history- never Smokers in home? none Illicit drug use? none Exercise - walking Diet - regular Sexually Active -not currently BMI- discussed the importance of a healthy diet, water intake and the benefits of aerobic exercise.  Educational material provided.   Safety  Patient feels safe at home- yes Patient does have smoke detectors at home- yes Patient does wear sunscreen or protective clothing when in direct sunlight -yes Patient does wear seat belt when in a moving vehicle -yes  Activities of Daily Living Patient can do their own household chores. Denies needing assistance with: driving, feeding themselves, getting from bed to chair, getting to the toilet, bathing/showering, dressing, managing money, or preparing meals.  No new identified risk were noted.    Depression Screen Patient denies losing interest in daily life, feeling hopeless, or crying easily over simple problems.   Medication-taking as directed and without issues.   Fall Screen Patient denies being afraid of falling or falling in the last year.   Memory Screen Patient denies problems with memory, misplacing items, and is able to balance checkbook/bank accounts.  Patient is alert, normal appearance, oriented to person/place/and time. Correctly identified the president of the [place] and recall of 2/3 objects. Patient colors, reads, plays computer games, and/or work puzzles for brain stimulation.  Immunizations The following Immunizations were discussed: Influenza, shingles, pneumonia, and tetanus.   Other Providers Patient Care Team: [name], MD as PCP - General (Internal Medicine)  Exercise Activities and Dietary recommendations Current Exercise Habits: Home exercise routine, Type of exercise: walking;stretching, Time (Minutes): 30, Frequency (Times/Week): 5, Weekly Exercise (Minutes/Week): 150,  Intensity: Mild  Goals      Patient Stated   . Advanced Care Planning to complete  (pt-stated)       Fall Risk Fall Risk  12/09/2018 12/01/2017 11/29/2016 08/09/2016 11/30/2015  Falls in the past year? 0 No No No No   Depression Screen PHQ 2/9 Scores 12/09/2018 12/01/2017 11/29/2016 08/09/2016  PHQ - 2 Score 0 0 0 0     Cognitive Function MMSE - Mini Mental State Exam 12/01/2017 11/29/2016  Orientation to time 5 5  Orientation to Place 5 5  Registration 3 3  Attention/ Calculation 5 5  Recall 3 3  Language- name 2 objects 2 2  Language- repeat 1 1  Language- follow 3 step command 3 3  Language- read & follow direction 1 1  Write a sentence 1 1  Copy design 1 1  Total score 30 30     6CIT Screen 12/09/2018  What Year? 0 points  What month? 0 points  What time? 0 points  Count back from 20 0 points  Months in reverse 0 points  Repeat phrase 0 points  Total Score 0    Immunization History  Administered Date(s) Administered  . H1N1 08/31/2008  . Influenza Split 05/22/2011, 04/29/2012  . Influenza Whole 08/12/2002, 05/05/2008, 05/16/2009, 05/21/2010  . Influenza, High Dose Seasonal PF 04/12/2013, 04/09/2016, 04/10/2017, 05/25/2018  . Influenza,inj,Quad PF,6+ Mos 04/20/2014, 05/02/2015  . Pneumococcal Conjugate-13 06/13/2014  . Pneumococcal Polysaccharide-23 06/21/2010, 11/30/2015  . Td 04/13/2004  . Tdap 05/11/2014  . Zoster 07/26/2009   Screening Tests Health Maintenance  Topic Date Due  . MAMMOGRAM  12/02/2018  . COLONOSCOPY  12/25/2018  . INFLUENZA VACCINE  03/13/2019  . TETANUS/TDAP  05/11/2024  . DEXA SCAN  Completed  . Hepatitis C Screening  Completed  . PNA vac Low Risk Adult  Completed      Plan:    End of life planning; Advance aging; Advanced directives discussed. Copy of current HCPOA/Living Will requested upon completion.    I have personally reviewed and noted the following in the patient's chart:   . Medical and social history . Use of  alcohol, tobacco or illicit drugs  . Current medications and supplements . Functional ability and status . Nutritional status . Physical activity . Advanced directives . List of other physicians . Hospitalizations, surgeries, and ER visits in previous 12 months . Vitals . Screenings to include cognitive, depression, and falls . Referrals and appointments  In addition, I have reviewed and discussed with patient certain preventive protocols, quality metrics, and best practice recommendations. A written personalized care plan for preventive services as well as general preventive health recommendations were provided to patient.     OBrien-Blaney, Ianna Salmela L, LPN  9/74/1638     I have reviewed the above information and agree with above.   Deborra Medina, MD

## 2018-12-23 ENCOUNTER — Encounter: Payer: Medicare HMO | Admitting: Internal Medicine

## 2018-12-23 ENCOUNTER — Ambulatory Visit: Payer: Medicare HMO

## 2019-01-15 ENCOUNTER — Encounter: Payer: Medicare HMO | Admitting: Internal Medicine

## 2019-01-18 ENCOUNTER — Ambulatory Visit: Payer: Medicare HMO | Admitting: Gastroenterology

## 2019-01-27 ENCOUNTER — Other Ambulatory Visit: Payer: Self-pay

## 2019-01-27 ENCOUNTER — Ambulatory Visit
Admission: RE | Admit: 2019-01-27 | Discharge: 2019-01-27 | Disposition: A | Discharge status: Home / Self Care | Payer: Medicare HMO | Source: Ambulatory Visit | Attending: Internal Medicine | Admitting: Internal Medicine

## 2019-01-27 DIAGNOSIS — Z1231 Encounter for screening mammogram for malignant neoplasm of breast: Secondary | ICD-10-CM | POA: Insufficient documentation

## 2019-02-10 ENCOUNTER — Encounter: Payer: Medicare HMO | Admitting: Gastroenterology

## 2019-02-22 ENCOUNTER — Other Ambulatory Visit: Payer: Self-pay

## 2019-02-23 ENCOUNTER — Other Ambulatory Visit: Payer: Self-pay

## 2019-02-23 ENCOUNTER — Ambulatory Visit (INDEPENDENT_AMBULATORY_CARE_PROVIDER_SITE_OTHER): Payer: Medicare HMO | Admitting: Internal Medicine

## 2019-02-23 ENCOUNTER — Encounter: Payer: Self-pay | Admitting: Internal Medicine

## 2019-02-23 VITALS — BP 160/80 | HR 85 | Temp 98.3°F | Resp 16 | Ht 63.0 in | Wt 139.0 lb

## 2019-02-23 DIAGNOSIS — R1310 Dysphagia, unspecified: Secondary | ICD-10-CM | POA: Diagnosis not present

## 2019-02-23 DIAGNOSIS — Z0000 Encounter for general adult medical examination without abnormal findings: Secondary | ICD-10-CM | POA: Diagnosis not present

## 2019-02-23 DIAGNOSIS — E785 Hyperlipidemia, unspecified: Secondary | ICD-10-CM

## 2019-02-23 DIAGNOSIS — E034 Atrophy of thyroid (acquired): Secondary | ICD-10-CM | POA: Diagnosis not present

## 2019-02-23 DIAGNOSIS — R1319 Other dysphagia: Secondary | ICD-10-CM

## 2019-02-23 DIAGNOSIS — D126 Benign neoplasm of colon, unspecified: Secondary | ICD-10-CM

## 2019-02-23 DIAGNOSIS — I10 Essential (primary) hypertension: Secondary | ICD-10-CM | POA: Diagnosis not present

## 2019-02-23 LAB — COMPREHENSIVE METABOLIC PANEL
ALT: 10 U/L (ref 0–35)
AST: 14 U/L (ref 0–37)
Albumin: 4 g/dL (ref 3.5–5.2)
Alkaline Phosphatase: 96 U/L (ref 39–117)
BUN: 7 mg/dL (ref 6–23)
CO2: 28 mEq/L (ref 19–32)
Calcium: 8.9 mg/dL (ref 8.4–10.5)
Chloride: 107 mEq/L (ref 96–112)
Creatinine, Ser: 0.64 mg/dL (ref 0.40–1.20)
GFR: 90.79 mL/min (ref >60.00)
Glucose, Bld: 92 mg/dL (ref 70–99)
Potassium: 4.1 mEq/L (ref 3.5–5.1)
Sodium: 141 mEq/L (ref 135–145)
Total Bilirubin: 0.4 mg/dL (ref 0.2–1.2)
Total Protein: 6.2 g/dL (ref 6.0–8.3)

## 2019-02-23 LAB — TSH: TSH: 1.94 u[IU]/mL (ref 0.35–4.50)

## 2019-02-23 NOTE — Progress Notes (Signed)
Patient ID: Crystal Kidd, female    DOB: [birth year]  Age: 73 y.o. MRN: [id number]  The patient is here for annual preventive examination and management of essential hypertension and other  chronic and acute problems.  HM:  Several tubular adenomas by May 2017 colonoscopy  Follow up due in years per Dr. Fuller Plan due to [place] of colon ca in maternal aunt and all siblings with polyps  N  The risk factors are reflected in the social history.  The roster of all physicians providing medical care to patient - is listed in the Snapshot section of the chart.  Activities of daily living:  The patient is 100% independent in all ADLs: dressing, toileting, feeding as well as independent mobility  Home safety : The patient has smoke detectors in the home. They wear seatbelts.  There are no firearms at home. There is no violence in the home.   There is no risks for hepatitis, STDs or HIV. There is no   history of blood transfusion. They have no travel history to infectious disease endemic areas of the world.  The patient has seen their dentist in the last six month. They have seen their eye doctor in the last year. They admit to slight hearing difficulty with regard to whispered voices and some television programs.  They have deferred audiologic testing in the last year.  They do not  have excessive sun exposure. Discussed the need for sun protection: hats, long sleeves and use of sunscreen if there is significant sun exposure.   Diet: the importance of a healthy diet is discussed. They do have a healthy diet.  The benefits of regular aerobic exercise were discussed. She is not walking due to the hot weather but  Does yardwork dAily .  No falls, no balance issues     Depression screen: there are no signs or vegative symptoms of depression- irritability, change in appetite, anhedonia, sadness/tearfullness.  Cognitive assessment: the patient manages all their financial and personal affairs and is actively  engaged. They could relate day,date,year and events; recalled 2/3 objects at 3 minutes; performed clock-face test normally.  The following portions of the patient's history were reviewed and updated as appropriate: allergies, current medications, past family history, past medical history,  past surgical history, past social history  and problem list.  Visual acuity was not assessed per patient preference since she has regular follow up with her ophthalmologist. Hearing and body mass index were assessed and reviewed.   During the course of the visit the patient was educated and counseled about appropriate screening and preventive services including : fall prevention , diabetes screening, nutrition counseling, colorectal cancer screening, and recommended immunizations.    CC: The primary encounter diagnosis was Encounter for preventive health examination. Diagnoses of Tubular adenoma of colon, Hypothyroidism due to acquired atrophy of thyroid, Essential hypertension, Esophageal dysphagia, and Hyperlipidemia LDL goal <100 were also pertinent to this visit.  recent recurrence of symptoms of dysphagia with eating bread.  She has a history of stricture   Seasonal rhinitis and right sided headache due to sinus congestion .   No fevers,  Taking temp daily  No cough except  for drainage .  Takes benadryl at night ,  claritin during the day   History Crystal Kidd has a past medical history of Allergic rhinoconjunctivitis, Allergy (1992), Arthritis, Cataract, Diverticulosis, GERD (gastroesophageal reflux disease), Hyperlipidemia, Hypertension, Osteopenia, Thyroid disease, Transfusion history (1968), Trapezius muscle spasm (04/13/2015), and Vitiligo.   She has a past  surgical history that includes Rotator cuff repair; Abdominal hysterectomy; Breast surgery; Appendectomy; Esophageal dilation; Colonoscopy; Eye surgery; Fracture surgery (6294TM 2004); Tubal ligation; Upper gastrointestinal endoscopy; Cholecystectomy (N/A,  04/30/2016); Breast excisional biopsy (Right, 1972); and Breast excisional biopsy (Left, 1980'S).   Her family history includes Breast cancer in her mother and sister; COPD in her brother, father, and son; Cancer in her mother; Cancer (age of onset: 5) in her maternal aunt; Colon cancer in her maternal aunt; Colon polyps in her brother and sister; Coronary artery disease in her brother; Stomach cancer in her mother.She reports that she has never smoked. She has never used smokeless tobacco. She reports current alcohol use of about 1.0 - 6.0 standard drinks of alcohol per week. She reports that she does not use drugs.  Outpatient Medications Prior to Visit  Medication Sig Dispense Refill  . acetaminophen (TYLENOL) 500 MG tablet Take 500 mg by mouth every 6 (six) hours as needed for headache (pain).    . benzonatate (TESSALON) 100 MG capsule TAKE 2 CAPSULES (200 MG TOTAL) BY MOUTH 3 (THREE) TIMES DAILY AS NEEDED FOR COUGH. 60 capsule 2  . calcium citrate-vitamin D (CITRACAL+D) 315-200 MG-UNIT tablet Take 1 tablet by mouth 2 (two) times daily.    . cholecalciferol (VITAMIN D) 1000 units tablet Take 1,000 Units by mouth 2 (two) times daily.    Mariane Baumgarten Sodium (COLACE PO) Take 1 tablet by mouth every other day.    . fluticasone (FLONASE) 50 MCG/ACT nasal spray Place 2 sprays into both nostrils daily. 48 g 6  . gabapentin (NEURONTIN) 100 MG capsule TAKE 1 CAPSULE (100 MG TOTAL) BY MOUTH 3 (THREE) TIMES DAILY. EVERY 8 HOURS AS NEEDED 90 capsule 1  . hydrochlorothiazide (HYDRODIURIL) 25 MG tablet TAKE 1/2 TABLET DAILY AS NEEDED 45 tablet 1  . ibuprofen (ADVIL,MOTRIN) 200 MG tablet Take 200 mg by mouth every 6 (six) hours as needed for moderate pain.     Marland Kitchen levothyroxine (SYNTHROID) 50 MCG tablet     . levothyroxine (SYNTHROID, LEVOTHROID) 75 MCG tablet Take 1 tablet (75 mcg total) by mouth daily before breakfast. 90 tablet 0  . loratadine (CLARITIN) 10 MG tablet Take 10 mg by mouth daily.    Marland Kitchen losartan  (COZAAR) 100 MG tablet TAKE 1 TABLET EVERY DAY 90 tablet 1  . omeprazole (PRILOSEC) 40 MG capsule TAKE 1 CAPSULE EVERY DAY 90 capsule 2  . ondansetron (ZOFRAN ODT) 4 MG disintegrating tablet Take 1 tablet (4 mg total) by mouth every 8 (eight) hours as needed for nausea or vomiting. 20 tablet 0  . Polyethyl Glycol-Propyl Glycol (SYSTANE OP) Place 1 drop into both eyes 4 (four) times daily as needed (dry eyes).    . simvastatin (ZOCOR) 20 MG tablet TAKE 1 TABLET EVERY EVENING 90 tablet 1   No facility-administered medications prior to visit.     Review of Systems   Patient denies headache, fevers, malaise, unintentional weight loss, skin rash, eye pain, sinus congestion and sinus pain, sore throat, dysphagia,  hemoptysis , cough, dyspnea, wheezing, chest pain, palpitations, orthopnea, edema, abdominal pain, nausea, melena, diarrhea, constipation, flank pain, dysuria, hematuria, urinary  Frequency, nocturia, numbness, tingling, seizures,  Focal weakness, Loss of consciousness,  Tremor, insomnia, depression, anxiety, and suicidal ideation.      Objective:  BP (!) 160/80 (BP Location: Left Arm, Patient Position: Sitting, Cuff Size: Normal)   Pulse 85   Temp 98.3 F (36.8 C) (Oral)   Resp 16   Ht 5\' 3"  (  1.6 m)   Wt 139 lb (63 kg)   SpO2 95%   BMI 24.62 kg/m   Physical Exam   General appearance: alert, cooperative and appears stated age Head: Normocephalic, without obvious abnormality, atraumatic Eyes: conjunctivae/corneas clear. PERRL, EOM's intact. Fundi benign. Ears: normal TM's and external ear canals both ears Nose: Nares normal. Septum midline. Mucosa normal. No drainage or sinus tenderness. Throat: lips, mucosa, and tongue normal; teeth and gums normal Neck: no adenopathy, no carotid bruit, no JVD, supple, symmetrical, trachea midline and thyroid not enlarged, symmetric, no tenderness/mass/nodules Lungs: clear to auscultation bilaterally Breasts: normal appearance, no masses or  tenderness Heart: regular rate and rhythm, S1, S2 normal, no murmur, click, rub or gallop Abdomen: soft, non-tender; bowel sounds normal; no masses,  no organomegaly Extremities: extremities normal, atraumatic, no cyanosis or edema Pulses: 2+ and symmetric Skin: Skin color, texture, turgor normal. No rashes or lesions Neurologic: Alert and oriented X 3, normal strength and tone. Normal symmetric reflexes. Normal coordination and gait.     Assessment & Plan:   Problem List Items Addressed This Visit      Unprioritized   Tubular adenoma of colon    3 yar follow up is due       Hypothyroidism    Thyroid function is WNL on current dose.  No current changes needed.   Lab Results  Component Value Date   TSH 1.94 02/23/2019         Relevant Medications   levothyroxine (SYNTHROID) 50 MCG tablet   Other Relevant Orders   TSH (Completed)   Hyperlipidemia LDL goal <100    LDL and triglycerides have been at goal on current medications. She  has no side effects and liver enzymes are normal. No changes today.  Lab Results  Component Value Date   CHOL 139 06/24/2018   HDL 45.60 06/24/2018   LDLCALC 78 06/24/2018   LDLDIRECT 79.0 11/30/2015   TRIG 80.0 06/24/2018   CHOLHDL 3 06/24/2018   Lab Results  Component Value Date   ALT 10 02/23/2019   AST 14 02/23/2019   ALKPHOS 96 02/23/2019   BILITOT 0.4 02/23/2019         Essential hypertension    Elevated today but home readings have been lower. Renal function stable, no changes until more recent readings can be reviewed. .  Lab Results  Component Value Date   CREATININE 0.64 02/23/2019   Lab Results  Component Value Date   NA 141 02/23/2019   K 4.1 02/23/2019   CL 107 02/23/2019   CO2 28 02/23/2019         Relevant Orders   Comprehensive metabolic panel (Completed)   Esophageal dysphagia    Recurrent.  Referral to Dr Fuller Plan for EGD .  Continue omeprazole       Encounter for preventive health examination -  Primary    age appropriate education and counseling updated, referrals for preventative services and immunizations addressed, dietary and smoking counseling addressed, most recent labs reviewed.  I have personally reviewed and have noted:  1) the patient's medical and social history 2) The pt's use of alcohol, tobacco, and illicit drugs 3) The patient's current medications and supplements 4) Functional ability including ADL's, fall risk, home safety risk, hearing and visual impairment 5) Diet and physical activities 6) Evidence for depression or mood disorder 7) The patient's height, weight, and BMI have been recorded in the chart  I have made referrals, and provided counseling and education based on  review of the above         I am having Crystal Kidd maintain her loratadine, Polyethyl Glycol-Propyl Glycol (SYSTANE OP), acetaminophen, ibuprofen, cholecalciferol, levothyroxine, calcium citrate-vitamin D, hydrochlorothiazide, benzonatate, losartan, gabapentin, ondansetron, fluticasone, omeprazole, simvastatin, Docusate Sodium (COLACE PO), and levothyroxine.  No orders of the defined types were placed in this encounter.   There are no discontinued medications.  Follow-up: No follow-ups on file.   [name], MD

## 2019-02-23 NOTE — Patient Instructions (Addendum)
For your allergies ,  You can continue use Benadryl at night if needed,   but you should consider substituting Allegra for claritin .  It may provide better relief.  generic Allegra is  available generically as fexofenadine ; the dose is 180 mg once daily   Please check your blood pressure a few times at home and send me the readings so I can determine if you need a change in medication.   Our goal for optimal blood pressure management is 130/80    Health Maintenance After Age 63 After age 53, you are at a higher risk for certain long-term diseases and infections as well as injuries from falls. Falls are a major cause of broken bones and head injuries in people who are older than age 39. Getting regular preventive care can help to keep you healthy and well. Preventive care includes getting regular testing and making lifestyle changes as recommended by your health care provider. Talk with your health care provider about:  Which screenings and tests you should have. A screening is a test that checks for a disease when you have no symptoms.  A diet and exercise plan that is right for you. What should I know about screenings and tests to prevent falls? Screening and testing are the best ways to find a health problem early. Early diagnosis and treatment give you the best chance of managing medical conditions that are common after age 46. Certain conditions and lifestyle choices may make you more likely to have a fall. Your health care provider may recommend:  Regular vision checks. Poor vision and conditions such as cataracts can make you more likely to have a fall. If you wear glasses, make sure to get your prescription updated if your vision changes.  Medicine review. Work with your health care provider to regularly review all of the medicines you are taking, including over-the-counter medicines. Ask your health care provider about any side effects that may make you more likely to have a fall. Tell  your health care provider if any medicines that you take make you feel dizzy or sleepy.  Osteoporosis screening. Osteoporosis is a condition that causes the bones to get weaker. This can make the bones weak and cause them to break more easily.  Blood pressure screening. Blood pressure changes and medicines to control blood pressure can make you feel dizzy.  Strength and balance checks. Your health care provider may recommend certain tests to check your strength and balance while standing, walking, or changing positions.  Foot health exam. Foot pain and numbness, as well as not wearing proper footwear, can make you more likely to have a fall.  Depression screening. You may be more likely to have a fall if you have a fear of falling, feel emotionally low, or feel unable to do activities that you used to do.  Alcohol use screening. Using too much alcohol can affect your balance and may make you more likely to have a fall. What actions can I take to lower my risk of falls? General instructions  Talk with your health care provider about your risks for falling. Tell your health care provider if: ? You fall. Be sure to tell your health care provider about all falls, even ones that seem minor. ? You feel dizzy, sleepy, or off-balance.  Take over-the-counter and prescription medicines only as told by your health care provider. These include any supplements.  Eat a healthy diet and maintain a healthy weight. A healthy diet includes  low-fat dairy products, low-fat (lean) meats, and fiber from whole grains, beans, and lots of fruits and vegetables. Home safety  Remove any tripping hazards, such as rugs, cords, and clutter.  Install safety equipment such as grab bars in bathrooms and safety rails on stairs.  Keep rooms and walkways well-lit. Activity   Follow a regular exercise program to stay fit. This will help you maintain your balance. Ask your health care provider what types of exercise are  appropriate for you.  If you need a cane or walker, use it as recommended by your health care provider.  Wear supportive shoes that have nonskid soles. Lifestyle  Do not drink alcohol if your health care provider tells you not to drink.  If you drink alcohol, limit how much you have: ? 0-1 drink a day for women. ? 0-2 drinks a day for men.  Be aware of how much alcohol is in your drink. In the [place] one drink equals one typical bottle of beer (12 oz), one-half glass of wine (5 oz), or one shot of hard liquor (1 oz).  Do not use any products that contain nicotine or tobacco, such as cigarettes and e-cigarettes. If you need help quitting, ask your health care provider. Summary  Having a healthy lifestyle and getting preventive care can help to protect your health and wellness after age 65.  Screening and testing are the best way to find a health problem early and help you avoid having a fall. Early diagnosis and treatment give you the best chance for managing medical conditions that are more common for people who are older than age 69.  Falls are a major cause of broken bones and head injuries in people who are older than age 16. Take precautions to prevent a fall at home.  Work with your health care provider to learn what changes you can make to improve your health and wellness and to prevent falls. This information is not intended to replace advice given to you by your health care provider. Make sure you discuss any questions you have with your health care provider. Document Released: 06/11/2017 Document Revised: 11/19/2018 Document Reviewed: 06/11/2017 Elsevier Patient Education  2020 [employer]. .

## 2019-02-24 DIAGNOSIS — D126 Benign neoplasm of colon, unspecified: Secondary | ICD-10-CM | POA: Insufficient documentation

## 2019-02-24 NOTE — Assessment & Plan Note (Addendum)
3 year follow up is due

## 2019-02-24 NOTE — Assessment & Plan Note (Signed)
Thyroid function is WNL on current dose.  No current changes needed.   Lab Results  Component Value Date   TSH 1.94 02/23/2019

## 2019-02-24 NOTE — Assessment & Plan Note (Signed)
LDL and triglycerides have been at goal on current medications. She  has no side effects and liver enzymes are normal. No changes today.  Lab Results  Component Value Date   CHOL 139 06/24/2018   HDL 45.60 06/24/2018   LDLCALC 78 06/24/2018   LDLDIRECT 79.0 11/30/2015   TRIG 80.0 06/24/2018   CHOLHDL 3 06/24/2018   Lab Results  Component Value Date   ALT 10 02/23/2019   AST 14 02/23/2019   ALKPHOS 96 02/23/2019   BILITOT 0.4 02/23/2019

## 2019-02-24 NOTE — Assessment & Plan Note (Signed)

## 2019-02-24 NOTE — Assessment & Plan Note (Addendum)
Elevated today but home readings have been lower. Renal function stable, no changes until more recent readings can be reviewed. .  Lab Results  Component Value Date   CREATININE 0.64 02/23/2019   Lab Results  Component Value Date   NA 141 02/23/2019   K 4.1 02/23/2019   CL 107 02/23/2019   CO2 28 02/23/2019

## 2019-02-24 NOTE — Assessment & Plan Note (Signed)
Recurrent.  Referral to Dr Fuller Plan for EGD .  Continue omeprazole

## 2019-02-25 ENCOUNTER — Other Ambulatory Visit: Payer: Self-pay | Admitting: Internal Medicine

## 2019-02-25 MED ORDER — LEVOTHYROXINE SODIUM 75 MCG PO TABS: 75.0000 ug | ORAL_TABLET | Freq: Every day | ORAL | 0 refills | Status: DC

## 2019-02-26 ENCOUNTER — Other Ambulatory Visit: Payer: Self-pay | Admitting: Internal Medicine

## 2019-02-26 MED ORDER — LEVOTHYROXINE SODIUM 75 MCG PO TABS: 75.0000 ug | ORAL_TABLET | Freq: Every day | ORAL | 0 refills | Status: DC

## 2019-02-26 NOTE — Telephone Encounter (Signed)
Medication Refill - Medication: levothyroxine (SYNTHROID) 75 MCG tablet    Has the patient contacted their pharmacy? Yes.   (Agent: If no, request that the patient contact the pharmacy for the refill.) (Agent: If yes, when and what did the pharmacy advise?)  Preferred Pharmacy (with phone number or street name): needs to be sent to [facility] for 90 day supply.  [facility].  Pt wants to make sure Mcarthur Rossetti knows it is 61mcg  Agent: Please be advised that RX refills may take up to 3 business days. We ask that you follow-up with your pharmacy.

## 2019-02-26 NOTE — Telephone Encounter (Signed)
Refill sent.

## 2019-03-09 MED ORDER — LEVOTHYROXINE SODIUM 75 MCG PO TABS: 75.0000 ug | ORAL_TABLET | Freq: Every day | ORAL | 1 refills | Status: DC

## 2019-03-09 NOTE — Telephone Encounter (Signed)
Called [employer] with refill rquest.

## 2019-03-09 NOTE — Addendum Note (Signed)
Addended by: Nanci Pina on: 03/09/2019 08:42 AM   Modules accepted: Orders

## 2019-03-09 NOTE — Telephone Encounter (Signed)
Pt called and stated that [facility] has not revived medication refill. Please resend. Pt states that she only has about 2 weeks left.

## 2019-04-22 ENCOUNTER — Other Ambulatory Visit: Payer: Self-pay | Admitting: Internal Medicine

## 2019-04-26 ENCOUNTER — Other Ambulatory Visit: Payer: Self-pay

## 2019-04-26 ENCOUNTER — Ambulatory Visit (INDEPENDENT_AMBULATORY_CARE_PROVIDER_SITE_OTHER): Payer: Medicare HMO

## 2019-04-26 ENCOUNTER — Ambulatory Visit: Payer: Medicare HMO

## 2019-04-26 DIAGNOSIS — Z23 Encounter for immunization: Secondary | ICD-10-CM

## 2019-05-08 ENCOUNTER — Other Ambulatory Visit: Payer: Self-pay | Admitting: Internal Medicine

## 2019-05-21 ENCOUNTER — Encounter: Payer: Self-pay | Admitting: Gastroenterology

## 2019-06-11 DIAGNOSIS — D1801 Hemangioma of skin and subcutaneous tissue: Secondary | ICD-10-CM | POA: Diagnosis not present

## 2019-06-11 DIAGNOSIS — D2272 Melanocytic nevi of left lower limb, including hip: Secondary | ICD-10-CM | POA: Diagnosis not present

## 2019-06-11 DIAGNOSIS — L814 Other melanin hyperpigmentation: Secondary | ICD-10-CM | POA: Diagnosis not present

## 2019-06-11 DIAGNOSIS — L80 Vitiligo: Secondary | ICD-10-CM | POA: Diagnosis not present

## 2019-06-11 DIAGNOSIS — D485 Neoplasm of uncertain behavior of skin: Secondary | ICD-10-CM | POA: Diagnosis not present

## 2019-06-11 DIAGNOSIS — D2271 Melanocytic nevi of right lower limb, including hip: Secondary | ICD-10-CM | POA: Diagnosis not present

## 2019-06-11 DIAGNOSIS — L821 Other seborrheic keratosis: Secondary | ICD-10-CM | POA: Diagnosis not present

## 2019-06-11 DIAGNOSIS — D225 Melanocytic nevi of trunk: Secondary | ICD-10-CM | POA: Diagnosis not present

## 2019-06-18 DIAGNOSIS — Z961 Presence of intraocular lens: Secondary | ICD-10-CM | POA: Diagnosis not present

## 2019-06-18 DIAGNOSIS — H10413 Chronic giant papillary conjunctivitis, bilateral: Secondary | ICD-10-CM | POA: Diagnosis not present

## 2019-06-18 DIAGNOSIS — H04123 Dry eye syndrome of bilateral lacrimal glands: Secondary | ICD-10-CM | POA: Diagnosis not present

## 2019-06-24 ENCOUNTER — Encounter: Payer: Self-pay | Admitting: Gastroenterology

## 2019-06-24 ENCOUNTER — Ambulatory Visit: Payer: Medicare HMO | Admitting: Gastroenterology

## 2019-06-24 ENCOUNTER — Other Ambulatory Visit: Payer: Self-pay

## 2019-06-24 VITALS — BP 160/90 | HR 113 | Temp 97.9°F | Ht 63.0 in | Wt 137.8 lb

## 2019-06-24 DIAGNOSIS — Z1159 Encounter for screening for other viral diseases: Secondary | ICD-10-CM

## 2019-06-24 DIAGNOSIS — R1310 Dysphagia, unspecified: Secondary | ICD-10-CM | POA: Diagnosis not present

## 2019-06-24 DIAGNOSIS — Z86010 Personal history of colonic polyps: Secondary | ICD-10-CM

## 2019-06-24 MED ORDER — SUPREP BOWEL PREP KIT 17.5-3.13-1.6 GM/177ML PO SOLN: 1.0000 | ORAL | 0 refills | Status: DC

## 2019-06-24 NOTE — Patient Instructions (Addendum)
You have been scheduled for an endoscopy and colonoscopy. Please follow the written instructions given to you at your visit today. Please pick up your prep supplies at the pharmacy within the next 1-3 days. If you use inhalers (even only as needed), please bring them with you on the day of your procedure. Your physician has requested that you go to www.[email or web address].com and enter the access code given to you at your visit today. This web site gives a general overview about your procedure. However, you should still follow specific instructions given to you by our office regarding your preparation for the procedure.     Take Miralax daily   Normal BMI (Body Mass Index- based on height and weight) is between 23 and 30. Your BMI today is Body mass index is 24.41 kg/m. Marland Kitchen Please consider follow up  regarding your BMI with your Primary Care Provider.  It was a pleasure to see you today!  Dr Fuller Plan

## 2019-06-24 NOTE — Progress Notes (Signed)
History of Present Illness: This is a 73 year old female referred by [name], MD for the evaluation of dysphagia and a personal history of colon polyps.  Patient relates intermittent solid food dysphagia over the past 1 to 2 years.  She is previously evaluated for dysphagia 10 years ago and had improvement in dysphagia following dilation.  Reflux symptoms well controlled on current regimen.  She relates mild constipation that is slightly improved with the use of Colace. Denies weight loss, abdominal pain, diarrhea, change in stool caliber, melena, hematochezia, nausea, vomiting, reflux symptoms, chest pain.   Colonoscopy 12/2015 - Two 10 to 15 mm polyps in the ascending colon, removed with a hot snare. Resected and retrieved. - One 10 mm polyp in the ascending colon, removed with a hot snare. Resected and retrieved. - One 7 mm polyp in the transverse colon, removed with a cold snare. Resected and retrieved. - Moderate diverticulosis in the sigmoid colon and in the descending colon. Pathology TA x 1 , SSP x 2  EGD 06/2009 Mild gastritis Dilation for dysphagia without a stricture   Allergies  Allergen Reactions  . Besivance [Besifloxacin Hcl] Shortness Of Breath and Other (See Comments)    Dizziness  . Alendronate Sodium Other (See Comments)    REACTION: intolerant ( PMH of esophageal stricture)  . Dilaudid [Hydromorphone Hcl] Itching    Nasal itching; resolution with Benadryl  . Ibandronate Sodium Other (See Comments)     bone pain   Outpatient Medications Prior to Visit  Medication Sig Dispense Refill  . acetaminophen (TYLENOL) 500 MG tablet Take 500 mg by mouth every 6 (six) hours as needed for headache (pain).    . benzonatate (TESSALON) 100 MG capsule TAKE 2 CAPSULES (200 MG TOTAL) BY MOUTH 3 (THREE) TIMES DAILY AS NEEDED FOR COUGH. 60 capsule 2  . calcium citrate-vitamin D (CITRACAL+D) 315-200 MG-UNIT tablet Take 1 tablet by mouth 2 (two) times daily.    .  cholecalciferol (VITAMIN D) 1000 units tablet Take 1,000 Units by mouth 2 (two) times daily.    Mariane Baumgarten Sodium (COLACE PO) Take 1 tablet by mouth every other day.    . fluticasone (FLONASE) 50 MCG/ACT nasal spray Place 2 sprays into both nostrils daily. 48 g 6  . gabapentin (NEURONTIN) 100 MG capsule TAKE 1 CAPSULE (100 MG TOTAL) BY MOUTH 3 (THREE) TIMES DAILY. EVERY 8 HOURS AS NEEDED 90 capsule 1  . hydrochlorothiazide (HYDRODIURIL) 25 MG tablet TAKE 1/2 TABLET DAILY AS NEEDED 45 tablet 1  . ibuprofen (ADVIL,MOTRIN) 200 MG tablet Take 200 mg by mouth every 6 (six) hours as needed for moderate pain.     Marland Kitchen levothyroxine (SYNTHROID) 75 MCG tablet Take 1 tablet (75 mcg total) by mouth daily before breakfast. 90 tablet 1  . loratadine (CLARITIN) 10 MG tablet Take 10 mg by mouth daily.    Marland Kitchen losartan (COZAAR) 100 MG tablet TAKE 1 TABLET EVERY DAY 90 tablet 1  . omeprazole (PRILOSEC) 40 MG capsule TAKE 1 CAPSULE EVERY DAY 90 capsule 2  . ondansetron (ZOFRAN ODT) 4 MG disintegrating tablet Take 1 tablet (4 mg total) by mouth every 8 (eight) hours as needed for nausea or vomiting. 20 tablet 0  . Polyethyl Glycol-Propyl Glycol (SYSTANE OP) Place 1 drop into both eyes 4 (four) times daily as needed (dry eyes).    . simvastatin (ZOCOR) 20 MG tablet TAKE 1 TABLET EVERY EVENING 90 tablet 1  . levothyroxine (SYNTHROID) 50 MCG tablet  No facility-administered medications prior to visit.    Past Medical History:  Diagnosis Date  . Allergic rhinoconjunctivitis   . Allergy 1992  . Arthritis   . Cataract   . Diverticulosis   . GERD (gastroesophageal reflux disease)    PMH of esophageal stricture  . Hyperlipidemia   . Hypertension   . Osteopenia    BMD @[facility]   . Thyroid disease    hypothyroidism  . Transfusion history 1968   post partum  . Trapezius muscle spasm 04/13/2015  . Vitiligo    Past Surgical History:  Procedure Laterality Date  . ABDOMINAL HYSTERECTOMY     for dysfunctional menses;  USO with TAH  . APPENDECTOMY    . BREAST EXCISIONAL BIOPSY Right 1972   NEG  . BREAST EXCISIONAL BIOPSY Left 1980'S   NEG  . BREAST SURGERY     biopsy X 2  . CHOLECYSTECTOMY N/A 04/30/2016   Procedure: LAPAROSCOPIC CHOLECYSTECTOMY;  Surgeon: Georganna Skeans, MD;  Location: [place];  Service: General;  Laterality: N/A;  . COLONOSCOPY     X2; Tics; Dr Fuller Plan, GI  . ESOPHAGEAL DILATION     X2  . EYE SURGERY    . FRACTURE SURGERY  2003or 2004  . ROTATOR CUFF REPAIR     R shoulder  . TUBAL LIGATION     befor hysterectomy  . UPPER GASTROINTESTINAL ENDOSCOPY     Social History   Socioeconomic History  . Marital status: Widowed    Spouse name: Not on file  . Number of children: Not on file  . Years of education: Not on file  . Highest education level: Not on file  Occupational History  . Not on file  Social Needs  . Financial resource strain: Not hard at all  . Food insecurity    Worry: Never true    Inability: Never true  . Transportation needs    Medical: No    Non-medical: No  Tobacco Use  . Smoking status: Never Smoker  . Smokeless tobacco: Never Used  Substance and Sexual Activity  . Alcohol use: Yes    Alcohol/week: 1.0 - 6.0 standard drinks    Types: 1 - 6 Glasses of wine per week    Comment: have wine 1 glass every 6 month  . Drug use: No  . Sexual activity: Not Currently  Lifestyle  . Physical activity    Days per week: 3 days    Minutes per session: 60 min  . Stress: Not at all  Relationships  . Social Herbalist on phone: Not on file    Gets together: Not on file    Attends religious service: Not on file    Active member of club or organization: Not on file    Attends meetings of clubs or organizations: Not on file    Relationship status: Not on file  Other Topics Concern  . Not on file  Social History Narrative  . Not on file   Family History  Problem Relation Age of Onset  . Breast cancer Sister        mammograms @ [facility]  . Colon  polyps Sister   . COPD Father   . Cancer Mother        breast&stomach  . Stomach cancer Mother   . Breast cancer Mother   . Colon cancer Maternal Aunt   . Cancer Maternal Aunt 80       colon cA  . Coronary artery disease Brother   .  COPD Brother   . Colon polyps Brother   . COPD Son   . Diabetes Neg Hx   . Stroke Neg Hx   . Heart disease Neg Hx   . Esophageal cancer Neg Hx   . Rectal cancer Neg Hx       Review of Systems: Pertinent positive and negative review of systems were noted in the above HPI section. All other review of systems were otherwise negative.   Physical Exam: General: Well developed, well nourished, no acute distress Head: Normocephalic and atraumatic Eyes:  sclerae anicteric, EOMI Ears: Normal auditory acuity Mouth: No deformity or lesions Neck: Supple, no masses or thyromegaly Lungs: Clear throughout to auscultation Heart: Regular rate and rhythm; no murmurs, rubs or bruits Abdomen: Soft, non tender and non distended. No masses, hepatosplenomegaly or hernias noted. Normal Bowel sounds Rectal: Deferred to colonoscopy Musculoskeletal: Symmetrical with no gross deformities  Skin: No lesions on visible extremities Pulses:  Normal pulses noted Extremities: No clubbing, cyanosis, edema or deformities noted Neurological: Alert oriented x 4, grossly nonfocal Cervical Nodes:  No significant cervical adenopathy Inguinal Nodes: No significant inguinal adenopathy Psychological:  Alert and cooperative. Normal mood and affect   Assessment and Recommendations:  1. Personal history of TA and SSP. She is due for surveillance colonoscopy.  Schedule colonoscopy. The risks (including bleeding, perforation, infection, missed lesions, medication reactions and possible hospitalization or surgery if complications occur), benefits, and alternatives to colonoscopy with possible biopsy and possible polypectomy were discussed with the patient and they consent to proceed.   2.  GERD. Dysphagia.  Rule out esophageal stricture, esophagitis, Barrett's.  Continue omeprazole 40 mg daily and follow antireflux measures.  Schedule EGD with possible dilation.  If no significant findings to explain dysphagia consider barium esophagram and esophageal manometry. The risks (including bleeding, perforation, infection, missed lesions, medication reactions and possible hospitalization or surgery if complications occur), benefits, and alternatives to endoscopy with possible biopsy and possible dilation were discussed with the patient and they consent to proceed.   3.  Mild constipation.  MiraLAX one half scoop to 2 scoops daily titrated for complete daily bowel movements.    cc: [name], MD [place],  [place] [id number]

## 2019-06-25 ENCOUNTER — Other Ambulatory Visit: Payer: Self-pay | Admitting: Internal Medicine

## 2019-07-12 ENCOUNTER — Encounter: Payer: Self-pay | Admitting: Gastroenterology

## 2019-07-14 ENCOUNTER — Ambulatory Visit: Payer: Self-pay | Admitting: *Deleted

## 2019-07-14 NOTE — Telephone Encounter (Signed)
Pt is scheduled for a virtual visit on 07/19/2019 at 9:30am.

## 2019-07-14 NOTE — Telephone Encounter (Signed)
Virtual visit 

## 2019-07-14 NOTE — Telephone Encounter (Signed)
Patient took BP and is having fluctuating BP readings:  Yesterday afternoon: 134/74                   evening: 161/80    Today early :  7 am 127/71                           8:30 144/78 Patient does have neck pain and is suffering with that.       Reason for Disposition . Systolic BP  >= 0000000 OR Diastolic >= [id number]  Answer Assessment - Initial Assessment Questions 1. BLOOD PRESSURE: "What is the blood pressure?" "Did you take at least two measurements 5 minutes apart?"     144/78, 160/85   2. ONSET: "When did you take your blood pressure?"     8:30 3. HOW: "How did you obtain the blood pressure?" (e.g., visiting nurse, automatic home BP monitor)     Automatic cuff-arm 4. HISTORY: "Do you have a history of high blood pressure?"     yes 5. MEDICATIONS: "Are you taking any medications for blood pressure?" "Have you missed any doses recently?"     Yes- patient has HCTZ for high reading and did take last night 6. OTHER SYMPTOMS: "Do you have any symptoms?" (e.g., headache, chest pain, blurred vision, difficulty breathing, weakness)     no 7. PREGNANCY: "Is there any chance you are pregnant?" "When was your last menstrual period?"     n/a  Protocols used: HIGH BLOOD PRESSURE-A-AH

## 2019-07-14 NOTE — Telephone Encounter (Signed)
Would like pt to schedule a virtual visit with you or a nurse visit?

## 2019-07-16 ENCOUNTER — Ambulatory Visit (INDEPENDENT_AMBULATORY_CARE_PROVIDER_SITE_OTHER): Payer: Medicare HMO

## 2019-07-16 ENCOUNTER — Other Ambulatory Visit: Payer: Self-pay | Admitting: Gastroenterology

## 2019-07-16 DIAGNOSIS — Z1159 Encounter for screening for other viral diseases: Secondary | ICD-10-CM | POA: Diagnosis not present

## 2019-07-16 LAB — SARS CORONAVIRUS 2 (TAT 6-24 HRS): SARS Coronavirus 2: NEGATIVE

## 2019-07-19 ENCOUNTER — Telehealth: Payer: Self-pay | Admitting: Gastroenterology

## 2019-07-19 ENCOUNTER — Other Ambulatory Visit: Payer: Self-pay

## 2019-07-19 ENCOUNTER — Ambulatory Visit: Payer: Self-pay

## 2019-07-19 ENCOUNTER — Telehealth: Payer: Self-pay

## 2019-07-19 ENCOUNTER — Encounter: Payer: Self-pay | Admitting: Internal Medicine

## 2019-07-19 ENCOUNTER — Ambulatory Visit (INDEPENDENT_AMBULATORY_CARE_PROVIDER_SITE_OTHER): Payer: Medicare HMO | Admitting: Internal Medicine

## 2019-07-19 DIAGNOSIS — I10 Essential (primary) hypertension: Secondary | ICD-10-CM | POA: Diagnosis not present

## 2019-07-19 DIAGNOSIS — R0981 Nasal congestion: Secondary | ICD-10-CM | POA: Insufficient documentation

## 2019-07-19 MED ORDER — ONDANSETRON 4 MG PO TBDP: 4.0000 mg | ORAL_TABLET | Freq: Three times a day (TID) | ORAL | 0 refills | Status: DC | PRN

## 2019-07-19 MED ORDER — LOSARTAN POTASSIUM 50 MG PO TABS: 50.0000 mg | ORAL_TABLET | Freq: Every day | ORAL | 0 refills | Status: DC

## 2019-07-19 NOTE — Telephone Encounter (Signed)
FYI

## 2019-07-19 NOTE — Telephone Encounter (Signed)
Copied from [place] [id number]. Topic: General - Other >> Jul 19, 2019  3:35 PM Virl Axe D wrote: Reason for CRM: Pt stated she spoke with Dr. Silvio Pate office and they told her not to worry about ber BP unless it went to 180 or above.

## 2019-07-19 NOTE — Assessment & Plan Note (Signed)
Elevated due to use of phenylephrine.   Advised to stop PE, use Afrin  Benadryl, tylenol and zyrtec  For sinus congestion.  Continue 12.5 mg hctz,

## 2019-07-19 NOTE — Assessment & Plan Note (Signed)
Secondary to allergic rhinitis.  Dc phenylephrine .

## 2019-07-19 NOTE — Telephone Encounter (Signed)
Crystal Kidd is scheduled to have EGD/colonoscopy tomorrow with Dr. Fuller Plan.  She started having nausea/vomiting. She does not believe that she would be able to tolerate preparation for colonoscopy. Feels very weak.   Plan: -Would only proceed with EGD tomorrow. Will off on colonoscopy at a later date when she is able to tolerate prep. -She was given Zofran by Dr. Derrel Nip. I have encouraged her to use that. -She will have full liquid diets until 6 hours before, clear liquid diet until 2 hours before the procedure.  RG

## 2019-07-19 NOTE — Telephone Encounter (Signed)
Pt called multiply times 1st complained about having diarrhea and than that her blood pressure was reading 166-84 and last phone call at 5:00 stating she was vomiting.  Pt is scheduled for ENDO/COLON 07-20-19 (tomorrow) Pt wanted to know if she should start prep.  Due to time Admitting advised she call Doctor on call through the phone service at [phone]

## 2019-07-19 NOTE — Telephone Encounter (Signed)
Spoke with Crystal Kidd and she stated that she does not have telmisartan she has losartan. Crystal Kidd had to get off the phone with me quickly because she is on the phone with Dr. Silvio Pate office and she has started throwing up so she is trying to figure out what she needs to do about her prep that she is supposed to start in a little while. Does Crystal Kidd need to start the losartan tonight and take the HCTZ in the morning?

## 2019-07-19 NOTE — Telephone Encounter (Signed)
Tell her to start the telmisartan tonight and continue the hctz in the morning   Do not worry about the loose stools for now.

## 2019-07-19 NOTE — Telephone Encounter (Signed)
Patient called after being seen virtually by Dr Derrel Nip today for elevated BP issues.  She has called back stating that this afternoon she has had her BP go up to 166/84 Hr 80 and 164/88 Hr 82.  She states that she has also started to have loose BM's. 2 loose stools this afternoon. She has been on a liquid diet today for GI procedure tomorrow.  She has been in contact with her GI doctor and told to proceed with the prep.  She did not report her BP to GI. Per office note today patient was to stop the cold medication phenylephrine and resume her HCTZ. She has taken her BP medications.  She states she has not taken the cold medication for 24 hours.  She was concerned that BP is still elevating in afternoon.  Patient denies cardiac and neurologic symptoms. She will Call her GI to report her BP. Note will be route to office for evaluation by Dr Derrel Nip. Reason for Disposition . [id number] Systolic BP  >= [id number] OR Diastolic >= 80 AND [id number] taking BP medications  Answer Assessment - Initial Assessment Questions 1. BLOOD PRESSURE: "What is the blood pressure?" "Did you take at least two measurements 5 minutes apart?"     166/84, Hr 80 2. ONSET: "When did you take your blood pressure?"    This afternoon 3. HOW: "How did you obtain the blood pressure?" (e.g., visiting nurse, automatic home BP monitor)    Home automatic monitor 4. HISTORY: "Do you have a history of high blood pressure?"    Yes on medications 5. MEDICATIONS: "Are you taking any medications for blood pressure?" "Have you missed any doses recently?"     no 6. OTHER SYMPTOMS: "Do you have any symptoms?" (e.g., headache, chest pain, blurred vision, difficulty breathing, weakness)     no 7. PREGNANCY: "Is there any chance you are pregnant?" "When was your  NA  Protocols used: HIGH BLOOD PRESSURE-A-AH

## 2019-07-19 NOTE — Telephone Encounter (Signed)
CALLED PATIENT AT 5:30.  Patient is vomiting as we speak  Has not even started the prep for the colonoscopy tomorrow .    Patient lives alone,  Has been vomiting for the past 2 hours on a clear liquid diet,  Has not been out of the house except for COVID testing last Friday. Reviewed all foods,  Nothing out of date .   Question whether this is  A FLU or COVID PRESENTATION?   She is unlikely to be able to tolerate the SUPREP oral load tonight and colonoscopy will need to be postponed.  She is waiting a callback from the GI physician on call.  Zofran refilled and losartan 50 mg dose sent to [facility] .  hctz discontinued

## 2019-07-19 NOTE — Progress Notes (Signed)
Telephone  Note  This visit type was conducted due to national recommendations for restrictions regarding the COVID-19 pandemic (e.g. social distancing).  This format is felt to be most appropriate for this patient at this time.  All issues noted in this document were discussed and addressed.  No physical exam was performed (except for noted visual exam findings with Video Visits).   I connected with@ on 07/19/19 at  9:30 AM EST by  telephone and verified that I am speaking with the correct person using two identifiers. Location patient: home Location provider: work or home office Persons participating in the virtual visit: patient, provider  I discussed the limitations, risks, security and privacy concerns of performing an evaluation and management service by telephone and the availability of in person appointments. I also discussed with the patient that there may be a patient responsible charge related to this service. The patient expressed understanding and agreed to proceed.  Reason for visit: elevated blood pressure  HPI:  73 yr old female with essential hypertension,  Off of medications for the past several months  presents with elevated readings for the past week.    Readings were at goal in the am but elvated to 150-160 in the evenings  Yesterday but was also taking phenylephrine for sinus congestion. ,  Has resumed 12.5 mg hctz with good results.  Has a colonoscopy on Tuesday    Sinus congestion,  Facial pressure,  No fevers or purulent sinus drainage managed with qhs benadryl ,  Symptoms have been present for 2 days   ROS: See pertinent positives and negatives per HPI.  Past Medical History:  Diagnosis Date  . Allergic rhinoconjunctivitis   . Allergy 1992  . Arthritis   . Cataract   . Diverticulosis   . GERD (gastroesophageal reflux disease)    PMH of esophageal stricture  . Hyperlipidemia   . Hypertension   . Osteopenia    BMD _0   . Thyroid disease    hypothyroidism   . Transfusion history 1968   post partum  . Trapezius muscle spasm 04/13/2015  . Vitiligo     Past Surgical History:  Procedure Laterality Date  . ABDOMINAL HYSTERECTOMY     for dysfunctional menses; USO with TAH  . APPENDECTOMY    . BREAST EXCISIONAL BIOPSY Right 1972   NEG  . BREAST EXCISIONAL BIOPSY Left 1980'S   NEG  . BREAST SURGERY     biopsy X 2  . CHOLECYSTECTOMY N/A 04/30/2016   Procedure: LAPAROSCOPIC CHOLECYSTECTOMY;  Surgeon: Georganna Skeans, MD;  Location: [facility];  Service: General;  Laterality: N/A;  . COLONOSCOPY     X2; Tics; Dr Fuller Plan, GI  . ESOPHAGEAL DILATION     X2  . EYE SURGERY    . FRACTURE SURGERY  2003or 2004  . ROTATOR CUFF REPAIR     R shoulder  . TUBAL LIGATION     befor hysterectomy  . UPPER GASTROINTESTINAL ENDOSCOPY      Family History  Problem Relation Age of Onset  . Breast cancer Sister        mammograms @ [facility]  . Colon polyps Sister   . COPD Father   . Cancer Mother        breast&stomach  . Stomach cancer Mother   . Breast cancer Mother   . Colon cancer Maternal Aunt   . Cancer Maternal Aunt 80       colon cA  . Coronary artery disease Brother   . COPD  Brother   . Colon polyps Brother   . COPD Son   . Diabetes Neg Hx   . Stroke Neg Hx   . Heart disease Neg Hx   . Esophageal cancer Neg Hx   . Rectal cancer Neg Hx     SOCIAL HX:  reports that she has never smoked. She has never used smokeless tobacco. She reports current alcohol use of about 1.0 - 6.0 standard drinks of alcohol per week. She reports that she does not use drugs.   Current Outpatient Medications:  .  acetaminophen (TYLENOL) 500 MG tablet, Take 500 mg by mouth every 6 (six) hours as needed for headache (pain)., Disp: , Rfl:  .  benzonatate (TESSALON) 100 MG capsule, TAKE 2 CAPSULES (200 MG TOTAL) BY MOUTH 3 (THREE) TIMES DAILY AS NEEDED FOR COUGH., Disp: 60 capsule, Rfl: 2 .  calcium citrate-vitamin D (CITRACAL+D) 315-200 MG-UNIT tablet, Take 1 tablet by mouth 2  (two) times daily., Disp: , Rfl:  .  cholecalciferol (VITAMIN D) 1000 units tablet, Take 1,000 Units by mouth 2 (two) times daily., Disp: , Rfl:  .  Docusate Sodium (COLACE PO), Take 1 tablet by mouth every other day., Disp: , Rfl:  .  fluticasone (FLONASE) 50 MCG/ACT nasal spray, Place 2 sprays into both nostrils daily., Disp: 48 g, Rfl: 6 .  gabapentin (NEURONTIN) 100 MG capsule, TAKE 1 CAPSULE (100 MG TOTAL) BY MOUTH 3 (THREE) TIMES DAILY. EVERY 8 HOURS AS NEEDED, Disp: 90 capsule, Rfl: 1 .  hydrochlorothiazide (HYDRODIURIL) 25 MG tablet, TAKE 1/2 TABLET DAILY AS NEEDED, Disp: 45 tablet, Rfl: 1 .  ibuprofen (ADVIL,MOTRIN) 200 MG tablet, Take 200 mg by mouth every 6 (six) hours as needed for moderate pain. , Disp: , Rfl:  .  levothyroxine (SYNTHROID) 75 MCG tablet, Take 1 tablet (75 mcg total) by mouth daily before breakfast., Disp: 90 tablet, Rfl: 1 .  loratadine (CLARITIN) 10 MG tablet, Take 10 mg by mouth daily., Disp: , Rfl:  .  losartan (COZAAR) 100 MG tablet, TAKE 1 TABLET EVERY DAY, Disp: 90 tablet, Rfl: 1 .  omeprazole (PRILOSEC) 40 MG capsule, TAKE 1 CAPSULE EVERY DAY, Disp: 90 capsule, Rfl: 3 .  ondansetron (ZOFRAN ODT) 4 MG disintegrating tablet, Take 1 tablet (4 mg total) by mouth every 8 (eight) hours as needed for nausea or vomiting., Disp: 20 tablet, Rfl: 0 .  Polyethyl Glycol-Propyl Glycol (SYSTANE OP), Place 1 drop into both eyes 4 (four) times daily as needed (dry eyes)., Disp: , Rfl:  .  simvastatin (ZOCOR) 20 MG tablet, TAKE 1 TABLET EVERY EVENING, Disp: 90 tablet, Rfl: 1 .  SUPREP BOWEL PREP KIT 17.5-3.13-1.6 GM/177ML SOLN, Take 1 kit by mouth as directed., Disp: 354 mL, Rfl: 0  EXAM:  General appearance: alert, cooperative and articulate.  No signs of being in distress  Lungs: not short of breath ,  No cough, speaking in full sentences  Psych: affect normal,dspeech is articulate and non pressured .  Denies suicidal thoughts  ASSESSMENT AND PLAN:  Discussed the following  assessment and plan:  Essential hypertension  Sinus congestion  Essential hypertension Elevated due to use of phenylephrine.   Advised to stop PE, use Afrin  Benadryl, tylenol and zyrtec  For sinus congestion.  Continue 12.5 mg hctz,   Sinus congestion Secondary to allergic rhinitis.  Dc phenylephrine .     I discussed the assessment and treatment plan with the patient. The patient was provided an opportunity to ask questions and  all were answered. The patient agreed with the plan and demonstrated an understanding of the instructions.   The patient was advised to call back or seek an in-person evaluation if the symptoms worsen or if the condition fails to improve as anticipated.   I provided  22 minutes of non-face-to-face time during this encounter reviewing patient's current problems and post surgeries.  Providing counseling on the above mentioned problems , and coordination  of care . [name], MD

## 2019-07-20 ENCOUNTER — Encounter: Payer: Self-pay | Admitting: Gastroenterology

## 2019-07-20 ENCOUNTER — Telehealth: Payer: Self-pay | Admitting: *Deleted

## 2019-07-20 ENCOUNTER — Telehealth: Payer: Self-pay | Admitting: Internal Medicine

## 2019-07-20 ENCOUNTER — Ambulatory Visit (AMBULATORY_SURGERY_CENTER): Payer: Medicare HMO | Admitting: Gastroenterology

## 2019-07-20 ENCOUNTER — Telehealth: Payer: Self-pay | Admitting: Gastroenterology

## 2019-07-20 VITALS — BP 137/57 | HR 80 | Temp 98.2°F | Resp 25 | Ht 63.0 in | Wt 137.0 lb

## 2019-07-20 DIAGNOSIS — K222 Esophageal obstruction: Secondary | ICD-10-CM

## 2019-07-20 DIAGNOSIS — K200 Eosinophilic esophagitis: Secondary | ICD-10-CM

## 2019-07-20 DIAGNOSIS — K228 Other specified diseases of esophagus: Secondary | ICD-10-CM | POA: Diagnosis not present

## 2019-07-20 DIAGNOSIS — R1310 Dysphagia, unspecified: Secondary | ICD-10-CM

## 2019-07-20 DIAGNOSIS — K3189 Other diseases of stomach and duodenum: Secondary | ICD-10-CM

## 2019-07-20 DIAGNOSIS — Z860100 Personal history of colon polyps, unspecified: Secondary | ICD-10-CM | POA: Insufficient documentation

## 2019-07-20 MED ORDER — SODIUM CHLORIDE 0.9 % IV SOLN: 500.0000 mL | Freq: Once | INTRAVENOUS | Status: DC

## 2019-07-20 NOTE — Telephone Encounter (Signed)
Patient called and would like to talk to someone about her BP medication and why there was another losartan send in for her. Please call patient back, thanks.

## 2019-07-20 NOTE — Telephone Encounter (Signed)
Patient called and confused on losartan dose is she to take 50 mg plus the 100 mg or just the 50 mg losartan patient had the endoscopy per formed but not the colonoscopy, patient said  she was already taking losartan 100 mg was she to add the 50 mg and should she take all at once.  Patient advised BP was with in normal range per GI during endoscopy, today but, was 158/60 on arrival . Ask  patient to take BP while on phone patient sitting while at rest, taking BP on left arm 160/78 pulse 79 patient has taken losartan 100 mg only today has not taken the HCTZ due to endoscopy.  Patient would also like to know when or if she should restart the HCTZ.

## 2019-07-20 NOTE — Telephone Encounter (Signed)
Left message to return call to office.

## 2019-07-20 NOTE — Op Note (Signed)
[facility] Patient Name: Crystal Kidd Procedure Date: 07/20/2019 1:58 PM MRN: [id number] Endoscopist: Ladene Artist , MD Age: 73 Referring MD:  Date of Birth: [birth year] Gender: Female Account #: [id number] Procedure:                Upper GI endoscopy Indications:              Dysphagia Medicines:                Monitored Anesthesia Care Procedure:                Pre-Anesthesia Assessment:                           - Prior to the procedure, a History and Physical                            was performed, and patient medications and                            allergies were reviewed. The patient's tolerance of                            previous anesthesia was also reviewed. The risks                            and benefits of the procedure and the sedation                            options and risks were discussed with the patient.                            All questions were answered, and informed consent                            was obtained. Prior Anticoagulants: The patient has                            taken no previous anticoagulant or antiplatelet                            agents. ASA Grade Assessment: II - A patient with                            mild systemic disease. After reviewing the risks                            and benefits, the patient was deemed in                            satisfactory condition to undergo the procedure.                           After obtaining informed consent, the endoscope was  passed under direct vision. Throughout the                            procedure, the patient's blood pressure, pulse, and                            oxygen saturations were monitored continuously. The                            Endoscope was introduced through the mouth, and                            advanced to the second part of duodenum. The upper                            GI endoscopy was accomplished without  difficulty.                            The patient tolerated the procedure well. Scope In: Scope Out: Findings:                 Two benign-appearing, intrinsic moderate stenoses                            were found 20 cm from the incisors. The narrowest                            stenosis measured 1.3 cm (inner diameter). The                            stenoses were traversed. A guidewire was placed and                            the scope was withdrawn. Dilations were performed                            with Savary dilators with mild resistance at 14 mm                            and 15 mm. Linear furrows associated with the                            stricture. Biopsies were taken with a cold forceps                            for histology. Small amount of heme on both                            dilators.                           The exam of the esophagus was otherwise normal.  Diffuse moderately erythematous mucosa without                            bleeding was found in the entire examined stomach.                            Biopsies were taken in the antrum and body with a                            cold forceps for histology.                           The exam of the stomach was otherwise normal.                           The duodenal bulb and second portion of the                            duodenum were normal. Complications:            No immediate complications. Estimated Blood Loss:     Estimated blood loss was minimal. Impression:               - Benign-appearing esophageal stenoses. Dilated.                            Biopsied.                           - Erythematous mucosa in the stomach. Biopsied.                           - Normal duodenal bulb and second portion of the                            duodenum. Recommendation:           - Patient has a contact number available for                            emergencies. The signs and symptoms of  potential                            delayed complications were discussed with the                            patient. Return to normal activities tomorrow.                            Written discharge instructions were provided to the                            patient.                           - Clear liquid diet for 2 hours, then advance as  tolerated to soft diet today.                           - Previous diet tomorrow.                           - Continue present medications.                           - Await pathology results.                           - Perform a colonoscopy at the next available                            appointment. Ladene Artist, MD 07/20/2019 2:19:32 PM This report has been signed electronically.

## 2019-07-20 NOTE — Progress Notes (Signed)
PT taken to PACU. Monitors in place. VSS. Report given to RN. 

## 2019-07-20 NOTE — Progress Notes (Signed)
Called to room to assist during endoscopic procedure.  Patient ID and intended procedure confirmed with present staff. Received instructions for my participation in the procedure from the performing physician.  

## 2019-07-20 NOTE — Patient Instructions (Signed)
Thank you for allowing [place] to care for you today!  Await pathology results, approximately 2 weeks.  Will  Make recommendations, if any, at that time  Follow post-dilation diet today.  Clear liquids for the first two hours, then soft foods for the remainder of the day.  Handout provided.  Resume previous medications today.  Return to your normal diet and activities tomorrow.    YOU HAD AN ENDOSCOPIC PROCEDURE TODAY AT [facility]:   Refer to the procedure report that was given to you for any specific questions about what was found during the examination.  If the procedure report does not answer your questions, please call your gastroenterologist to clarify.  If you requested that your care partner not be given the details of your procedure findings, then the procedure report has been included in a sealed envelope for you to review at your convenience later.  YOU SHOULD EXPECT: Some feelings of bloating in the abdomen. Passage of more gas than usual.  Walking can help get rid of the air that was put into your GI tract during the procedure and reduce the bloating. If you had a lower endoscopy (such as a colonoscopy or flexible sigmoidoscopy) you may notice spotting of blood in your stool or on the toilet paper. If you underwent a bowel prep for your procedure, you may not have a normal bowel movement for a few days.  Please Note:  You might notice some irritation and congestion in your nose or some drainage.  This is from the oxygen used during your procedure.  There is no need for concern and it should clear up in a day or so.  SYMPTOMS TO REPORT IMMEDIATELY:   Following upper endoscopy (EGD)  Vomiting of blood or coffee ground material  New chest pain or pain under the shoulder blades  Painful or persistently difficult swallowing  New shortness of breath  Fever of 100F or higher  Black, tarry-looking stools  For urgent or emergent issues, a gastroenterologist can be  reached at any hour by calling [phone].   DIET:  We do recommend a small meal at first, but then you may proceed to your regular diet.  Drink plenty of fluids but you should avoid alcoholic beverages for 24 hours.  ACTIVITY:  You should plan to take it easy for the rest of today and you should NOT DRIVE or use heavy machinery until tomorrow (because of the sedation medicines used during the test).    FOLLOW UP: Our staff will call the number listed on your records 48-72 hours following your procedure to check on you and address any questions or concerns that you may have regarding the information given to you following your procedure. If we do not reach you, we will leave a message.  We will attempt to reach you two times.  During this call, we will ask if you have developed any symptoms of COVID 19. If you develop any symptoms (ie: fever, flu-like symptoms, shortness of breath, cough etc.) before then, please call [phone].  If you test positive for Covid 19 in the 2 weeks post procedure, please call and report this information to [place].    If any biopsies were taken you will be contacted by phone or by letter within the next 1-3 weeks.  Please call us at [phone] if you have not heard about the biopsies in 3 weeks.    SIGNATURES/CONFIDENTIALITY: You and/or your care partner have signed paperwork which will  be entered into your electronic medical record.  These signatures attest to the fact that that the information above on your After Visit Summary has been reviewed and is understood.  Full responsibility of the confidentiality of this discharge information lies with you and/or your care-partner. 

## 2019-07-20 NOTE — Telephone Encounter (Signed)
Her total  dose  Of losartan is 100 mg daly,  Start the hctz in the morning

## 2019-07-20 NOTE — Telephone Encounter (Signed)
Patient notified and voiced understanding with repeat top take losartan 100 mg and HCTZ 25 mg daily.

## 2019-07-20 NOTE — Telephone Encounter (Signed)
noted 

## 2019-07-20 NOTE — Telephone Encounter (Signed)
Copied from [place] [id number]. Topic: General - Call Back - No Documentation >> Jul 19, 2019  6:09 PM Erick Blinks wrote: Reason for CRM: [phone]  Pt called needing clarification for her medication instructions. Please advise

## 2019-07-20 NOTE — Progress Notes (Signed)
VS-CW Temp-JB

## 2019-07-20 NOTE — Telephone Encounter (Signed)
Patient called back is requesting a callback with clarification on medication. Please advise .

## 2019-07-21 ENCOUNTER — Other Ambulatory Visit: Payer: Self-pay | Admitting: Internal Medicine

## 2019-07-22 ENCOUNTER — Telehealth: Payer: Self-pay

## 2019-07-22 NOTE — Telephone Encounter (Signed)
  Follow up Call-  Call back number 07/20/2019  Post procedure Call Back phone  # [phone]  Permission to leave phone message Yes  Some recent data might be hidden     Patient questions:  Do you have a fever, pain , or abdominal swelling? No. Pain Score  0 * Did have some trouble swallowing a pill last night but says she's ok this am  Have you tolerated food without any problems? Yes.    Have you been able to return to your normal activities? Yes.    Do you have any questions about your discharge instructions: Diet   No. Medications  No. Follow up visit  No.  Do you have questions or concerns about your Care? No.  Actions: * If pain score is 4 or above: No action needed, pain <4.  1. Have you developed a fever since your procedure? no  2.   Have you had an respiratory symptoms (SOB or cough) since your procedure? no  3.   Have you tested positive for COVID 19 since your procedure no  4.   Have you had any family members/close contacts diagnosed with the COVID 19 since your procedure?  no   If yes to any of these questions please route to Joylene John, RN and Alphonsa Gin, Therapist, sports.

## 2019-07-28 ENCOUNTER — Other Ambulatory Visit: Payer: Self-pay

## 2019-07-28 MED ORDER — FLOVENT HFA 220 MCG/ACT IN AERO: INHALATION_SPRAY | RESPIRATORY_TRACT | 1 refills | Status: DC

## 2019-07-28 NOTE — Progress Notes (Signed)
Patient notified rx sent 

## 2019-07-29 ENCOUNTER — Telehealth: Payer: Self-pay | Admitting: Gastroenterology

## 2019-07-29 NOTE — Telephone Encounter (Signed)
Clarified with patient on how long to be on Flovent and how to swallow medication instead of inhaling medication.

## 2019-08-11 ENCOUNTER — Other Ambulatory Visit: Payer: Self-pay | Admitting: Internal Medicine

## 2019-08-12 ENCOUNTER — Other Ambulatory Visit: Payer: Self-pay | Admitting: Internal Medicine

## 2019-09-01 ENCOUNTER — Ambulatory Visit: Payer: Medicare HMO | Attending: Internal Medicine

## 2019-09-01 DIAGNOSIS — Z23 Encounter for immunization: Secondary | ICD-10-CM

## 2019-09-01 NOTE — Progress Notes (Signed)
   Covid-19 Vaccination Clinic  Name:  Crystal Kidd    MRN: [id number] DOB: 1946/07/06  09/01/2019  [name] was observed post Covid-19 immunization for 15 minutes without incidence. She was provided with Vaccine Information Sheet and instruction to access the V-Safe system.   [name] was instructed to call 911 with any severe reactions post vaccine: Marland Kitchen Difficulty breathing  . Swelling of your face and throat  . A fast heartbeat  . A bad rash all over your body  . Dizziness and weakness    Immunizations Administered    Name Date Dose VIS Date Route   Pfizer COVID-19 Vaccine 09/01/2019 10:41 AM 0.3 mL 07/23/2019 Intramuscular   Manufacturer: [place]   Lot: [id number]   [place]: [id number]

## 2019-09-10 ENCOUNTER — Ambulatory Visit: Payer: Medicare HMO | Admitting: Gastroenterology

## 2019-09-11 ENCOUNTER — Other Ambulatory Visit: Payer: Self-pay | Admitting: Internal Medicine

## 2019-09-16 ENCOUNTER — Other Ambulatory Visit: Payer: Self-pay

## 2019-09-16 ENCOUNTER — Encounter: Payer: Self-pay | Admitting: Gastroenterology

## 2019-09-16 ENCOUNTER — Ambulatory Visit: Payer: Medicare HMO | Admitting: Gastroenterology

## 2019-09-16 VITALS — BP 150/78 | HR 104 | Temp 98.2°F | Ht 63.0 in | Wt 140.0 lb

## 2019-09-16 DIAGNOSIS — K200 Eosinophilic esophagitis: Secondary | ICD-10-CM | POA: Diagnosis not present

## 2019-09-16 DIAGNOSIS — K222 Esophageal obstruction: Secondary | ICD-10-CM

## 2019-09-16 DIAGNOSIS — Z86010 Personal history of colonic polyps: Secondary | ICD-10-CM

## 2019-09-16 MED ORDER — NA SULFATE-K SULFATE-MG SULF 17.5-3.13-1.6 GM/177ML PO SOLN: 1.0000 | Freq: Once | ORAL | 0 refills | Status: AC

## 2019-09-16 MED ORDER — METOCLOPRAMIDE HCL 10 MG PO TABS: ORAL_TABLET | ORAL | 0 refills | Status: DC

## 2019-09-16 NOTE — Patient Instructions (Addendum)
Remain on Flovent 1 puff twice daily. When your inhaler runs out please discontinue.   Remain on omeprazole daily.   We have sent the following medications to your pharmacy for you to pick up at your convenience: Reglan 10 mg prior to each prep dose.   You have been scheduled for a colonoscopy. Please follow written instructions given to you at your visit today.  Please pick up your prep supplies at the pharmacy within the next 1-3 days. If you use inhalers (even only as needed), please bring them with you on the day of your procedure.  Normal BMI (Body Mass Index- based on height and weight) is between 23 and 30. Your BMI today is Body mass index is 24.8 kg/m. Marland Kitchen Please consider follow up  regarding your BMI with your Primary Care Provider.  Thank you for choosing me and [facility].  Pricilla Riffle. Dagoberto Ligas., MD., Marval Regal

## 2019-09-16 NOTE — Progress Notes (Signed)
    History of Present Illness: This is a 74 year old female returning for follow-up of eosinophilic esophagitis, GERD, dysphagia, esophageal stricture and personal history of colon polyps.  She states the inhaler caused a few sores in her mouth. We reduced the dose to 1 puff twice daily with ongoing improvement in her swallowing symptoms however her oral symptoms persist.  She has no dysphagia states her swallowing is now normal.  She would like to schedule colonoscopy.  She had nausea and vomiting after taking the prep previously.  EGD 07/2019 - Benign-appearing esophageal stenoses. Dilated. Biopsied. - Erythematous mucosa in the stomach. Biopsied. - Normal duodenal bulb and second portion of the duodenum.  1. Surgical [P], gastric antrum and gastric body - ANTRAL AND OXYNTIC MUCOSA WITH CHANGES CONSISTENT WITH REACTIVE GASTROPATHY. Hinton Dyer NEGATIVE FOR HELICOBACTER PYLORI. - NO INTESTINAL METAPLASIA, DYSPLASIA OR MALIGNANCY. 2. Surgical [P], esophagus 20cm - SQUAMOUS MUCOSA WITH FOCALLY INCREASED EOSINOPHILS. - FOCALLY GREATER THAN 30 PER HIGH POWER FIELD. - PAS NEGATIVE FOR FUNGUS  Current Medications, Allergies, Past Medical History, Past Surgical History, Family History and Social History were reviewed in [employer] record.   Physical Exam: General: Well developed, well nourished, no acute distress Head: Normocephalic and atraumatic Eyes:  sclerae anicteric, EOMI Ears: Normal auditory acuity Mouth: No deformity or lesions Lungs: Clear throughout to auscultation Heart: Regular rate and rhythm; no murmurs, rubs or bruits Abdomen: Soft, non tender and non distended. No masses, hepatosplenomegaly or hernias noted. Normal Bowel sounds Rectal: Not done Musculoskeletal: Symmetrical with no gross deformities  Pulses:  Normal pulses noted Extremities: No clubbing, cyanosis, edema or deformities noted Neurological: Alert oriented x 4, grossly  nonfocal Psychological:  Alert and cooperative. Normal mood and affect   Assessment and Recommendations:  1. Eosinophilic esophagitis. GERD.  Continue Flovent 1 puff twice daily for 2 more weeks and then discontinue due to oral side effects.  If dysphagia recurs and strictures recur will reassess treatment options.  Follow antireflux measures and remain on omeprazole 40 mg daily. REV in 1 year..   2. Personal history of TAs and SSPs. Schedule colonoscopy.  Provide Reglan 10 mg 30 to 60 minutes before each bowel prep dose.  The risks (including bleeding, perforation, infection, missed lesions, medication reactions and possible hospitalization or surgery if complications occur), benefits, and alternatives to colonoscopy with possible biopsy and possible polypectomy were discussed with the patient and they consent to proceed.

## 2019-09-20 ENCOUNTER — Ambulatory Visit: Payer: Medicare HMO | Attending: Internal Medicine

## 2019-09-20 DIAGNOSIS — Z23 Encounter for immunization: Secondary | ICD-10-CM | POA: Insufficient documentation

## 2019-09-20 NOTE — Progress Notes (Signed)
   Covid-19 Vaccination Clinic  Name:  Crystal Kidd    MRN: [id number] DOB: 10/02/1945  09/20/2019  [name] was observed post Covid-19 immunization for 15 minutes without incidence. She was provided with Vaccine Information Sheet and instruction to access the V-Safe system.   [name] was instructed to call 911 with any severe reactions post vaccine: Marland Kitchen Difficulty breathing  . Swelling of your face and throat  . A fast heartbeat  . A bad rash all over your body  . Dizziness and weakness    Immunizations Administered    Name Date Dose VIS Date Route   Pfizer COVID-19 Vaccine 09/20/2019  3:47 PM 0.3 mL 07/23/2019 Intramuscular   Manufacturer: [place]   Lot: [id number]   [place]: [id number]

## 2019-10-01 ENCOUNTER — Encounter: Payer: Self-pay | Admitting: Emergency Medicine

## 2019-10-01 ENCOUNTER — Emergency Department
Admission: EM | Admit: 2019-10-01 | Discharge: 2019-10-01 | Disposition: A | Discharge status: Home / Self Care | Payer: Medicare HMO | Attending: Emergency Medicine | Admitting: Emergency Medicine

## 2019-10-01 ENCOUNTER — Other Ambulatory Visit: Payer: Self-pay

## 2019-10-01 DIAGNOSIS — Z79899 Other long term (current) drug therapy: Secondary | ICD-10-CM | POA: Diagnosis not present

## 2019-10-01 DIAGNOSIS — I10 Essential (primary) hypertension: Secondary | ICD-10-CM | POA: Insufficient documentation

## 2019-10-01 DIAGNOSIS — R1310 Dysphagia, unspecified: Secondary | ICD-10-CM | POA: Diagnosis not present

## 2019-10-01 DIAGNOSIS — E039 Hypothyroidism, unspecified: Secondary | ICD-10-CM | POA: Diagnosis not present

## 2019-10-01 LAB — COMPREHENSIVE METABOLIC PANEL
ALT: 15 U/L (ref 0–44)
AST: 20 U/L (ref 15–41)
Albumin: 4 g/dL (ref 3.5–5.0)
Alkaline Phosphatase: 97 U/L (ref 38–126)
Anion gap: 14 (ref 5–15)
BUN: 13 mg/dL (ref 8–23)
CO2: 25 mmol/L (ref 22–32)
Calcium: 8.9 mg/dL (ref 8.9–10.3)
Chloride: 92 mmol/L — ABNORMAL LOW (ref 98–111)
Creatinine, Ser: 0.78 mg/dL (ref 0.44–1.00)
GFR calc Af Amer: >60 mL/min (ref >60)
GFR calc non Af Amer: >60 mL/min (ref >60)
Glucose, Bld: 125 mg/dL — ABNORMAL HIGH (ref 70–99)
Potassium: 3.6 mmol/L (ref 3.5–5.1)
Sodium: 131 mmol/L — ABNORMAL LOW (ref 135–145)
Total Bilirubin: 0.7 mg/dL (ref 0.3–1.2)
Total Protein: 6.6 g/dL (ref 6.5–8.1)

## 2019-10-01 LAB — CBC WITH DIFFERENTIAL/PLATELET
Abs Immature Granulocytes: 0.02 10*3/uL (ref 0.00–0.07)
Basophils Absolute: 0.1 10*3/uL (ref 0.0–0.1)
Basophils Relative: 1 %
Eosinophils Absolute: 0.2 10*3/uL (ref 0.0–0.5)
Eosinophils Relative: 3 %
HCT: 40.8 % (ref 36.0–46.0)
Hemoglobin: 13.8 g/dL (ref 12.0–15.0)
Immature Granulocytes: 0 %
Lymphocytes Relative: 15 %
Lymphs Abs: 1 10*3/uL (ref 0.7–4.0)
MCH: 29.2 pg (ref 26.0–34.0)
MCHC: 33.8 g/dL (ref 30.0–36.0)
MCV: 86.3 fL (ref 80.0–100.0)
Monocytes Absolute: 0.6 10*3/uL (ref 0.1–1.0)
Monocytes Relative: 9 %
Neutro Abs: 4.7 10*3/uL (ref 1.7–7.7)
Neutrophils Relative %: 72 %
Platelets: 318 10*3/uL (ref 150–400)
RBC: 4.73 MIL/uL (ref 3.87–5.11)
RDW: 12.6 % (ref 11.5–15.5)
WBC: 6.6 10*3/uL (ref 4.0–10.5)
nRBC: 0 % (ref 0.0–0.2)

## 2019-10-01 MED ORDER — FLOVENT HFA 220 MCG/ACT IN AERO: INHALATION_SPRAY | RESPIRATORY_TRACT | 0 refills | Status: DC

## 2019-10-01 MED ORDER — MAGIC MOUTHWASH: 5.0000 mL | Freq: Three times a day (TID) | ORAL | 0 refills | Status: DC | PRN

## 2019-10-01 MED ORDER — MAGIC MOUTHWASH
10.0000 mL | Freq: Once | ORAL | Status: AC
  Administered 2019-10-01: 10 mL via ORAL
  Filled 2019-10-01: qty 10

## 2019-10-01 NOTE — ED Provider Notes (Signed)
[facility] Emergency Department Provider Note   ____________________________________________   First MD Initiated Contact with Patient 10/01/19 2303     (approximate)  I have reviewed the triage vital signs and the nursing notes.   HISTORY  Chief Complaint Dysphagia    HPI Crystal Kidd is a 74 y.o. female who presents to the ED from home with a chief complaint of dysphagia.  Patient has a history of eosinophilic esophagitis who is being followed by [facility] gastroenterology.  She is being treated with Flovent.  Had a visit on 2/4 in which they reduced her dose of Flovent to 1 puff twice daily instead of 2 puffs twice daily because she was developing painful mouth ulcers.  She was instructed to go off the Flovent completely in 2 weeks which she did 2 days ago.  Yesterday she began to experience similar prior symptoms of dysphagia and difficulty swallowing her saliva.  Has no difficulty swallowing food or liquids.  Denies fever, cough, chest pain, shortness of breath, abdominal pain, vomiting or diarrhea.      Past Medical History:  Diagnosis Date  . Allergic rhinoconjunctivitis   . Allergy 1992  . Arthritis   . Cataract   . Diverticulosis   . Eosinophilic esophagitis   . GERD (gastroesophageal reflux disease)    PMH of esophageal stricture  . Hyperlipidemia   . Hypertension   . Osteopenia    BMD @[facility]   . Thyroid disease    hypothyroidism  . Transfusion history 1968   post partum  . Trapezius muscle spasm 04/13/2015  . Vitiligo     Patient Active Problem List   Diagnosis Date Noted  . Personal history of colonic polyps 07/20/2019  . Sinus congestion 07/19/2019  . Tubular adenoma of colon 02/24/2019  . History of vertigo 06/26/2018  . Family history of breast cancer in mother 12/21/2016  . S/P laparoscopic cholecystectomy 06/04/2016  . Thoracic aortic atherosclerosis ([place]) 01/02/2016  . Encounter for preventive health examination 06/14/2014   . Pain in joint, shoulder region 12/06/2013  . S/P hysterectomy with oophorectomy 12/06/2013  . Cataract 12/06/2013  . Transfusion history   . Cervical disc disorder with radiculopathy of cervical region 05/21/2010  . Esophageal dysphagia 05/31/2009  . VITAMIN D DEFICIENCY 05/16/2009  . Hypothyroidism 12/26/2007  . Hyperlipidemia LDL goal <100 12/26/2007  . Essential hypertension 12/26/2007  . GERD 12/26/2007  . Diverticulosis of large intestine 12/26/2007  . VITILIGO 12/31/2006  . OSTEOPENIA 12/31/2006    Past Surgical History:  Procedure Laterality Date  . ABDOMINAL HYSTERECTOMY     for dysfunctional menses; USO with TAH  . APPENDECTOMY    . BREAST EXCISIONAL BIOPSY Right 1972   NEG  . BREAST EXCISIONAL BIOPSY Left 1980'S   NEG  . BREAST SURGERY     biopsy X 2  . CHOLECYSTECTOMY N/A 04/30/2016   Procedure: LAPAROSCOPIC CHOLECYSTECTOMY;  Surgeon: Georganna Skeans, MD;  Location: [place];  Service: General;  Laterality: N/A;  . COLONOSCOPY     X2; Tics; Dr Fuller Plan, GI  . ESOPHAGEAL DILATION     X2  . EYE SURGERY    . FRACTURE SURGERY  2003or 2004  . ROTATOR CUFF REPAIR     R shoulder  . TUBAL LIGATION     befor hysterectomy  . UPPER GASTROINTESTINAL ENDOSCOPY      Prior to Admission medications   Medication Sig Start Date End Date Taking? Authorizing Provider  acetaminophen (TYLENOL) 500 MG tablet Take 500 mg  by mouth every 6 (six) hours as needed for headache (pain).    [provider]  benzonatate (TESSALON) 100 MG capsule TAKE 2 CAPSULES (200 MG TOTAL) BY MOUTH 3 (THREE) TIMES DAILY AS NEEDED FOR COUGH. 11/04/17   [name], MD  calcium citrate-vitamin D (CITRACAL+D) 315-200 MG-UNIT tablet Take 1 tablet by mouth 2 (two) times daily.    [provider]  cholecalciferol (VITAMIN D) 1000 units tablet Take 1,000 Units by mouth 2 (two) times daily.    [provider]  fluticasone (FLONASE) 50 MCG/ACT nasal spray Place 2 sprays into both  nostrils daily. 07/31/18   [name], MD  fluticasone (FLOVENT HFA) 220 MCG/ACT inhaler Swallow ( not inhale) two puffs twice a day 10/01/19   Paulette Blanch, MD  gabapentin (NEURONTIN) 100 MG capsule TAKE 1 CAPSULE (100 MG TOTAL) BY MOUTH 3 (THREE) TIMES DAILY. EVERY 8 HOURS AS NEEDED 04/22/19   [name], MD  hydrochlorothiazide (HYDRODIURIL) 25 MG tablet TAKE 1/2 TABLET DAILY AS NEEDED 01/13/17   [name], MD  ibuprofen (ADVIL,MOTRIN) 200 MG tablet Take 200 mg by mouth every 6 (six) hours as needed for moderate pain.     [provider]  levothyroxine (SYNTHROID) 75 MCG tablet TAKE 1 TABLET (75 MCG TOTAL) BY MOUTH DAILY BEFORE BREAKFAST. 08/11/19   [name], MD  loratadine (CLARITIN) 10 MG tablet Take 10 mg by mouth daily.    [provider]  losartan (COZAAR) 100 MG tablet TAKE 1 TABLET EVERY DAY 07/21/19   [name], MD  magic mouthwash SOLN Take 5 mLs by mouth 3 (three) times daily as needed for mouth pain. 10/01/19   Paulette Blanch, MD  metoCLOPramide (REGLAN) 10 MG tablet Take one tablet by mouth by mouth one hour prior to each prep dose. 09/16/19   Ladene Artist, MD  omeprazole (PRILOSEC) 40 MG capsule TAKE 1 CAPSULE EVERY DAY 06/28/19   [name], MD  ondansetron (ZOFRAN ODT) 4 MG disintegrating tablet Take 1 tablet (4 mg total) by mouth every 8 (eight) hours as needed for nausea or vomiting. 07/19/19   [name], MD  Polyethyl Glycol-Propyl Glycol (SYSTANE OP) Place 1 drop into both eyes 4 (four) times daily as needed (dry eyes).    [provider]  polyethylene glycol (MIRALAX / GLYCOLAX) 17 g packet Take 17 g by mouth daily.    [provider]  simvastatin (ZOCOR) 20 MG tablet TAKE 1 TABLET EVERY EVENING 05/10/19   [name], MD    Allergies Besivance [besifloxacin hcl], Alendronate sodium, Dilaudid [hydromorphone hcl], Codeine, and Ibandronate sodium  Family History  Problem Relation Age of Onset  . Breast  cancer Sister        mammograms @ [facility]  . Colon polyps Sister   . COPD Father   . Cancer Mother        breast&stomach  . Stomach cancer Mother   . Breast cancer Mother   . Colon cancer Maternal Aunt   . Cancer Maternal Aunt 80       colon cA  . Coronary artery disease Brother   . COPD Brother   . Colon polyps Brother   . COPD Son   . Diabetes Neg Hx   . Stroke Neg Hx   . Heart disease Neg Hx   . Esophageal cancer Neg Hx   . Rectal cancer Neg Hx     Social History Social History  Tobacco Use  . Smoking status: Never Smoker  . Smokeless tobacco: Never Used  Substance Use Topics  . Alcohol use: Yes    Alcohol/week: 1.0 - 6.0 standard drinks    Types: 1 - 6 Glasses of wine per week    Comment: have wine 1 glass every 6 month  . Drug use: No    Review of Systems  Constitutional: No fever/chills Eyes: No visual changes. ENT: Positive for dysphagia.  No sore throat. Cardiovascular: Denies chest pain. Respiratory: Denies shortness of breath. Gastrointestinal: No abdominal pain.  No nausea, no vomiting.  No diarrhea.  No constipation. Genitourinary: Negative for dysuria. Musculoskeletal: Negative for back pain. Skin: Negative for rash. Neurological: Negative for headaches, focal weakness or numbness.   ____________________________________________   PHYSICAL EXAM:  VITAL SIGNS: ED Triage Vitals  Enc Vitals Group     BP 10/01/19 2019 (!) 150/77     Pulse Rate 10/01/19 2019 94     Resp 10/01/19 2019 18     Temp 10/01/19 2019 98.3 F (36.8 C)     Temp Source 10/01/19 2019 Oral     SpO2 10/01/19 2019 98 %     Weight 10/01/19 2020 139 lb (63 kg)     Height 10/01/19 2020 5\' 3"  (1.6 m)     Head Circumference --      Peak Flow --      Pain Score 10/01/19 2020 0     Pain Loc --      Pain Edu? --      Excl. in [place]? --     Constitutional: Alert and oriented. Well appearing and in no acute distress. Eyes: Conjunctivae are normal. PERRL. EOMI. Head:  Atraumatic. Nose: No congestion/rhinnorhea. Mouth/Throat: Mucous membranes are moist.  Oropharynx non-erythematous.  Aphthous ulcers and ulcers on tongue noted.  No difficulty speaking.  Phonation normal.  No difficulty swallowing her saliva. Neck: No stridor.  No palpable mass. Cardiovascular: Normal rate, regular rhythm. Grossly normal heart sounds.  Good peripheral circulation. Respiratory: Normal respiratory effort.  No retractions. Lungs CTAB. Gastrointestinal: Soft and nontender. No distention. No abdominal bruits. No CVA tenderness. Musculoskeletal: No lower extremity tenderness nor edema.  No joint effusions. Neurologic:  Normal speech and language. No gross focal neurologic deficits are appreciated. No gait instability. Skin:  Skin is warm, dry and intact. No rash noted. Psychiatric: Mood and affect are normal. Speech and behavior are normal.  ____________________________________________   LABS (all labs ordered are listed, but only abnormal results are displayed)  Labs Reviewed  COMPREHENSIVE METABOLIC PANEL - Abnormal; Notable for the following components:      Result Value   Sodium 131 (*)    Chloride 92 (*)    Glucose, Bld 125 (*)    All other components within normal limits  CBC WITH DIFFERENTIAL/PLATELET   ____________________________________________  EKG  None ____________________________________________  RADIOLOGY  ED MD interpretation: None  Official radiology report(s): No results found.  ____________________________________________   PROCEDURES  Procedure(s) performed (including Critical Care):  Procedures   ____________________________________________   INITIAL IMPRESSION / ASSESSMENT AND PLAN / ED COURSE  As part of my medical decision making, I reviewed the following data within the [facility] notes reviewed and incorporated, Old chart reviewed and Notes from prior ED visits     Crystal Kidd was evaluated in  Emergency Department on 10/02/2019 for the symptoms described in the history of present illness. She was evaluated in the context of the global  COVID-19 pandemic, which necessitated consideration that the patient might be at risk for infection with the SARS-CoV-2 virus that causes COVID-19. Institutional protocols and algorithms that pertain to the evaluation of patients at risk for COVID-19 are in a state of rapid change based on information released by regulatory bodies including the CDC and federal and state organizations. These policies and algorithms were followed during the patient's care in the ED.  74 year old female with eosinophilic esophagitis who presents with dysphagia.  Differential diagnosis includes but is not limited to GERD, stricture, malignancy, etc.   Symptoms recurred after discontinuation of Flovent.  Instructed patient to resume Flovent at 1 puff twice daily.  Will also prescribe Magic mouthwash for oral ulcers.  She is to follow-up with her GI doctor as soon as possible next week.  Strict return precautions given.  Patient verbalizes understanding and agrees with plan of care.      ____________________________________________   FINAL CLINICAL IMPRESSION(S) / ED DIAGNOSES  Final diagnoses:  Dysphagia, unspecified type     ED Discharge Orders         Ordered    fluticasone (FLOVENT HFA) 220 MCG/ACT inhaler     10/01/19 2329    magic mouthwash SOLN  3 times daily PRN     10/01/19 2330           Note:  This document was prepared using Dragon voice recognition software and may include unintentional dictation errors.   Paulette Blanch, MD 10/02/19 [place] [place]

## 2019-10-01 NOTE — Discharge Instructions (Addendum)
1.  Restart Flovent 1 puff twice daily. 2.  You may use Magic mouthwash as needed for painful mouth ulcers. 3.  Return to the ER for worsening symptoms, persistent vomiting, difficulty breathing or other concerns.

## 2019-10-01 NOTE — ED Triage Notes (Signed)
Patient ambulatory to triage with complaints of difficulty swallowing  - intermittent yesterday but worse tonight.  Pt reports having endoscopy in Dec for same issue and was swallowing an inhaler for the difficulty until she began to have a reaction to the inhaler. Pt reports "I'm able to swallow food and water but can't swallow my saliva" - pt belching in triage to alleviate the discomfort   Pt reports diagnosis of allergy of the esophagus by Dr Fuller Plan   Pt denies pain but reports discomfort and says "I feel like if I could throw up then it'd be better"

## 2019-10-07 ENCOUNTER — Ambulatory Visit: Payer: Medicare HMO | Admitting: Gastroenterology

## 2019-10-07 ENCOUNTER — Encounter: Payer: Self-pay | Admitting: Gastroenterology

## 2019-10-07 VITALS — BP 120/76 | HR 90 | Temp 97.7°F | Ht 63.0 in | Wt 137.2 lb

## 2019-10-07 DIAGNOSIS — R1312 Dysphagia, oropharyngeal phase: Secondary | ICD-10-CM | POA: Diagnosis not present

## 2019-10-07 MED ORDER — NYSTATIN 100000 UNIT/ML MT SUSP: OROMUCOSAL | 0 refills | Status: DC

## 2019-10-07 NOTE — Patient Instructions (Signed)
If you are age 75 or older, your body mass index should be between 23-30. Your Body mass index is 24.31 kg/m. If this is out of the aforementioned range listed, please consider follow up with your Primary Care Provider.  If you are age 80 or younger, your body mass index should be between 19-25. Your Body mass index is 24.31 kg/m. If this is out of the aformentioned range listed, please consider follow up with your Primary Care Provider.   Call back with which mouthwash you have at home ask to speak with [facility]. Will likely call in a different mouthwash once we hear from you.   Call back on Tuesday or Wednesday next week with an update. Ask to speak with Patti.   Thank you for choosing me and [facility].  Alonza Bogus, PA

## 2019-10-07 NOTE — Progress Notes (Signed)
10/14/2019 JAHLEAH DEVANY [id number] 01/28/1946   HISTORY OF PRESENT ILLNESS:  This is a 74 year old female who follows with Dr. Fuller Plan for eosinophilic esophagitis, GERD, dysphagia, esophageal stricture and personal history of colon polyps.  Was just here on 2/4 to see him and she stated that the inhaler caused a few sores in her mouth. He had reduced the dose to 1 puff twice daily with ongoing improvement in her swallowing symptoms, however, her oral symptoms persisted.  They decided that she would continue for 2 more weeks and then discontinue due to oral side effects.   EGD 07/2019 - Benign-appearing esophageal stenoses. Dilated. Biopsied. - Erythematous mucosa in the stomach. Biopsied. - Normal duodenal bulb and second portion of the duodenum.   1. Surgical [P], gastric antrum and gastric body - ANTRAL AND OXYNTIC MUCOSA WITH CHANGES CONSISTENT WITH REACTIVE GASTROPATHY. [place] NEGATIVE FOR HELICOBACTER PYLORI. - NO INTESTINAL METAPLASIA, DYSPLASIA OR MALIGNANCY. 2. Surgical [P], esophagus 20cm - SQUAMOUS MUCOSA WITH FOCALLY INCREASED EOSINOPHILS. - FOCALLY GREATER THAN 30 PER HIGH POWER FIELD. - PAS NEGATIVE FOR FUNGUS  She is here today with ongoing complaints of sore mouth.  She says that it has actually progressed to the point that she has even been having trouble swallowing water and saliva (more mouth/throat area).  Went to the ED and they prescribed magic mouthwash, which she has been using since since her ED visit on 2/19.  She says that this dysphagia is different than what she had before and did not start until she started using the flovent inhaler.  She has also discontinued use of the inhaler as well.    Past Medical History:  Diagnosis Date  . Allergic rhinoconjunctivitis   . Allergy 1992  . Arthritis   . Cataract   . Diverticulosis   . Eosinophilic esophagitis   . GERD (gastroesophageal reflux disease)    PMH of esophageal stricture  . Hyperlipidemia    . Hypertension   . Osteopenia    BMD @[facility]   . Thyroid disease    hypothyroidism  . Transfusion history 1968   post partum  . Trapezius muscle spasm 04/13/2015  . Vitiligo    Past Surgical History:  Procedure Laterality Date  . ABDOMINAL HYSTERECTOMY     for dysfunctional menses; USO with TAH  . APPENDECTOMY    . BREAST EXCISIONAL BIOPSY Right 1972   NEG  . BREAST EXCISIONAL BIOPSY Left 1980'S   NEG  . BREAST SURGERY     biopsy X 2  . CHOLECYSTECTOMY N/A 04/30/2016   Procedure: LAPAROSCOPIC CHOLECYSTECTOMY;  Surgeon: Georganna Skeans, MD;  Location: [place];  Service: General;  Laterality: N/A;  . COLONOSCOPY     X2; Tics; Dr Fuller Plan, GI  . ESOPHAGEAL DILATION     X2  . EYE SURGERY    . FRACTURE SURGERY  2003or 2004  . ROTATOR CUFF REPAIR     R shoulder  . TUBAL LIGATION     befor hysterectomy  . UPPER GASTROINTESTINAL ENDOSCOPY      reports that she has never smoked. She has never used smokeless tobacco. She reports current alcohol use. She reports that she does not use drugs. family history includes Breast cancer in her mother and sister; COPD in her brother, father, and son; Cancer in her mother; Cancer (age of onset: 32) in her maternal aunt; Colon cancer in her maternal aunt; Colon polyps in her brother and sister; Coronary artery disease in her brother; Stomach  cancer in her mother. Allergies  Allergen Reactions  . Besivance [Besifloxacin Hcl] Shortness Of Breath and Other (See Comments)    Dizziness  . Alendronate Sodium Other (See Comments)    REACTION: intolerant ( PMH of esophageal stricture)  . Dilaudid [Hydromorphone Hcl] Itching    Nasal itching; resolution with Benadryl  . Codeine Nausea And Vomiting and Other (See Comments)    hallucinations  . Ibandronate Sodium Other (See Comments)     bone pain      Outpatient Encounter Medications as of 10/07/2019  Medication Sig  . acetaminophen (TYLENOL) 500 MG tablet Take 500 mg by mouth every 6 (six) hours as  needed for headache (pain).  . benzonatate (TESSALON) 100 MG capsule TAKE 2 CAPSULES (200 MG TOTAL) BY MOUTH 3 (THREE) TIMES DAILY AS NEEDED FOR COUGH.  . calcium citrate-vitamin D (CITRACAL+D) 315-200 MG-UNIT tablet Take 1 tablet by mouth 2 (two) times daily.  . cholecalciferol (VITAMIN D) 1000 units tablet Take 1,000 Units by mouth 2 (two) times daily.  . fluticasone (FLONASE) 50 MCG/ACT nasal spray Place 2 sprays into both nostrils daily.  . fluticasone (FLOVENT HFA) 220 MCG/ACT inhaler Swallow ( not inhale) two puffs twice a day  . gabapentin (NEURONTIN) 100 MG capsule TAKE 1 CAPSULE (100 MG TOTAL) BY MOUTH 3 (THREE) TIMES DAILY. EVERY 8 HOURS AS NEEDED  . hydrochlorothiazide (HYDRODIURIL) 25 MG tablet TAKE 1/2 TABLET DAILY AS NEEDED  . ibuprofen (ADVIL,MOTRIN) 200 MG tablet Take 200 mg by mouth every 6 (six) hours as needed for moderate pain.   Marland Kitchen levothyroxine (SYNTHROID) 75 MCG tablet TAKE 1 TABLET (75 MCG TOTAL) BY MOUTH DAILY BEFORE BREAKFAST.  Marland Kitchen loratadine (CLARITIN) 10 MG tablet Take 10 mg by mouth daily.  Marland Kitchen losartan (COZAAR) 100 MG tablet TAKE 1 TABLET EVERY DAY  . magic mouthwash SOLN Take 5 mLs by mouth 3 (three) times daily as needed for mouth pain.  Marland Kitchen metoCLOPramide (REGLAN) 10 MG tablet Take one tablet by mouth by mouth one hour prior to each prep dose.  Marland Kitchen omeprazole (PRILOSEC) 40 MG capsule TAKE 1 CAPSULE EVERY DAY  . ondansetron (ZOFRAN ODT) 4 MG disintegrating tablet Take 1 tablet (4 mg total) by mouth every 8 (eight) hours as needed for nausea or vomiting.  Vladimir Faster Glycol-Propyl Glycol (SYSTANE OP) Place 1 drop into both eyes 4 (four) times daily as needed (dry eyes).  . polyethylene glycol (MIRALAX / GLYCOLAX) 17 g packet Take 17 g by mouth daily.  . simvastatin (ZOCOR) 20 MG tablet TAKE 1 TABLET EVERY EVENING  . [DISCONTINUED] nystatin (MYCOSTATIN) 100000 UNIT/ML suspension Swish & swallow three times daily.   No facility-administered encounter medications on file as of  10/07/2019.     REVIEW OF SYSTEMS  : All other systems reviewed and negative except where noted in the History of Present Illness.   PHYSICAL EXAM: BP 120/76 (BP Location: Left Arm, Patient Position: Sitting, Cuff Size: Normal)   Pulse 90   Temp 97.7 F (36.5 C)   Ht 5\' 3"  (1.6 m)   Wt 137 lb 4 oz (62.3 kg)   SpO2 97%   BMI 24.31 kg/m  General: Well developed white female in no acute distress Head: Normocephalic and atraumatic Eyes:  Sclerae anicteric, conjunctiva pink. Ears: Normal auditory acuity Lungs: Clear throughout to auscultation; no increased WOB. Heart: Regular rate and rhythm; no M/R/G. Abdomen: Soft, non-distended.  BS present. Non-tender. Musculoskeletal: Symmetrical with no gross deformities  Skin: No lesions on visible extremities Extremities:  No edema  Neurological: Alert oriented x 4, grossly non-focal Psychological:  Alert and cooperative. Normal mood and affect  ASSESSMENT AND PLAN: *Sore mouth and oropharyngeal dysphagia type picture since taking fluticasone for EoE.  ? Candidiasis.  Was given magic mouthwash and seems to be helping slightly, but is almost out and was expensive.  Will prescribe nystatin swish and swallow to be used three times daily for the next 7-14 days.  Continue without the flovent inhaler for now.  She will call back sometime next week with another update on her symptoms.   CC:  [name], MD

## 2019-10-11 ENCOUNTER — Other Ambulatory Visit: Payer: Self-pay

## 2019-10-11 MED ORDER — NYSTATIN 100000 UNIT/ML MT SUSP: 5.0000 mL | Freq: Three times a day (TID) | OROMUCOSAL | 1 refills | Status: DC

## 2019-10-11 NOTE — Progress Notes (Signed)
THE ORIGINAL SCRIPT was MISSING THE AMOUNT TO SWISH. Script resent

## 2019-10-13 ENCOUNTER — Telehealth: Payer: Self-pay | Admitting: Gastroenterology

## 2019-10-13 NOTE — Telephone Encounter (Signed)
Pt would like to speak with you to give you an update on her progress. Pls call her.

## 2019-10-13 NOTE — Telephone Encounter (Signed)
The pt states she is able to swallow liquids and some small foods.  Still has trouble with swallowing breads, meats. She says she feels better but still has this feeling of "soreness" Please advise

## 2019-10-13 NOTE — Telephone Encounter (Signed)
Pt called stating returning call

## 2019-10-14 ENCOUNTER — Encounter: Payer: Self-pay | Admitting: Gastroenterology

## 2019-10-14 DIAGNOSIS — R1312 Dysphagia, oropharyngeal phase: Secondary | ICD-10-CM | POA: Insufficient documentation

## 2019-10-14 NOTE — Telephone Encounter (Signed)
I spoke with the pt and she states that she is using the nystatin as directed.  She did start it on 3/1.  She says she is doing some better but has occasional issues with certain foods, esp. Meats, breads and today some Ice cream.

## 2019-10-14 NOTE — Telephone Encounter (Signed)
Left message on machine to call back  

## 2019-10-14 NOTE — Telephone Encounter (Signed)
Pt called stated returning your call

## 2019-10-14 NOTE — Telephone Encounter (Signed)
Ok.  I am glad that she is doing somewhat better.  Have her continue the nystatin and call back early next week again.  I am hoping that it will progressively get better with that and being off of the fluticasone inhaler.

## 2019-10-14 NOTE — Telephone Encounter (Signed)
So which mouthwash has she been using?  The nystatin?  If so how long has she been using it?  It looks like prescription was just sent on the 1st?  Please apologize to her for the delay and tell her that I am working at the hospital this week.

## 2019-10-14 NOTE — Progress Notes (Signed)
Reviewed and agree with management plan.  Deepak Bless T. Margaretann Abate, MD FACG [facility]  

## 2019-10-14 NOTE — Telephone Encounter (Signed)
The pt has been advised and will call next week with an update

## 2019-10-15 ENCOUNTER — Other Ambulatory Visit: Payer: Self-pay

## 2019-10-15 ENCOUNTER — Ambulatory Visit: Payer: Medicare HMO | Admitting: Internal Medicine

## 2019-10-15 MED ORDER — FLUCONAZOLE 100 MG PO TABS: ORAL_TABLET | ORAL | 0 refills | Status: DC

## 2019-10-15 NOTE — Telephone Encounter (Signed)
I spoke with the pt and she tells me that she is having trouble swallowing liquids once again.  Per verbal order from Alonza Bogus the pt has been sent diflucan 200 mg today and 100 mg for the next 6 days for a total of 7 days and the pt is aware that if the dysphagia worsens over the weekend she is to go to the ED for evaluation.  The pt has been advised of the information and verbalized understanding.   Prescription has been sent to [facility] per pt request.

## 2019-10-17 ENCOUNTER — Telehealth: Payer: Self-pay | Admitting: Internal Medicine

## 2019-10-17 ENCOUNTER — Other Ambulatory Visit: Payer: Self-pay

## 2019-10-17 ENCOUNTER — Emergency Department (HOSPITAL_COMMUNITY)
Admission: EM | Admit: 2019-10-17 | Discharge: 2019-10-17 | Disposition: A | Discharge status: Home / Self Care | Payer: Medicare HMO | Source: Home / Self Care | Attending: Emergency Medicine | Admitting: Emergency Medicine

## 2019-10-17 ENCOUNTER — Encounter (HOSPITAL_COMMUNITY): Payer: Self-pay

## 2019-10-17 ENCOUNTER — Emergency Department (HOSPITAL_COMMUNITY): Payer: Medicare HMO

## 2019-10-17 DIAGNOSIS — K2970 Gastritis, unspecified, without bleeding: Secondary | ICD-10-CM | POA: Diagnosis present

## 2019-10-17 DIAGNOSIS — Z888 Allergy status to other drugs, medicaments and biological substances status: Secondary | ICD-10-CM | POA: Diagnosis not present

## 2019-10-17 DIAGNOSIS — R1310 Dysphagia, unspecified: Secondary | ICD-10-CM | POA: Diagnosis not present

## 2019-10-17 DIAGNOSIS — Z79899 Other long term (current) drug therapy: Secondary | ICD-10-CM | POA: Diagnosis not present

## 2019-10-17 DIAGNOSIS — R627 Adult failure to thrive: Secondary | ICD-10-CM | POA: Diagnosis present

## 2019-10-17 DIAGNOSIS — J029 Acute pharyngitis, unspecified: Secondary | ICD-10-CM | POA: Diagnosis not present

## 2019-10-17 DIAGNOSIS — R1319 Other dysphagia: Secondary | ICD-10-CM | POA: Diagnosis present

## 2019-10-17 DIAGNOSIS — E876 Hypokalemia: Secondary | ICD-10-CM | POA: Diagnosis not present

## 2019-10-17 DIAGNOSIS — Z79890 Hormone replacement therapy (postmenopausal): Secondary | ICD-10-CM | POA: Diagnosis not present

## 2019-10-17 DIAGNOSIS — Z86010 Personal history of colonic polyps: Secondary | ICD-10-CM | POA: Diagnosis not present

## 2019-10-17 DIAGNOSIS — R0602 Shortness of breath: Secondary | ICD-10-CM | POA: Insufficient documentation

## 2019-10-17 DIAGNOSIS — Z20822 Contact with and (suspected) exposure to covid-19: Secondary | ICD-10-CM | POA: Diagnosis present

## 2019-10-17 DIAGNOSIS — M8580 Other specified disorders of bone density and structure, unspecified site: Secondary | ICD-10-CM | POA: Diagnosis present

## 2019-10-17 DIAGNOSIS — K1370 Unspecified lesions of oral mucosa: Secondary | ICD-10-CM | POA: Diagnosis present

## 2019-10-17 DIAGNOSIS — E034 Atrophy of thyroid (acquired): Secondary | ICD-10-CM | POA: Diagnosis not present

## 2019-10-17 DIAGNOSIS — R070 Pain in throat: Secondary | ICD-10-CM | POA: Insufficient documentation

## 2019-10-17 DIAGNOSIS — K224 Dyskinesia of esophagus: Secondary | ICD-10-CM | POA: Diagnosis present

## 2019-10-17 DIAGNOSIS — K219 Gastro-esophageal reflux disease without esophagitis: Secondary | ICD-10-CM | POA: Diagnosis present

## 2019-10-17 DIAGNOSIS — Z885 Allergy status to narcotic agent status: Secondary | ICD-10-CM | POA: Diagnosis not present

## 2019-10-17 DIAGNOSIS — N179 Acute kidney failure, unspecified: Secondary | ICD-10-CM | POA: Diagnosis present

## 2019-10-17 DIAGNOSIS — K200 Eosinophilic esophagitis: Secondary | ICD-10-CM | POA: Diagnosis present

## 2019-10-17 DIAGNOSIS — K222 Esophageal obstruction: Secondary | ICD-10-CM | POA: Diagnosis present

## 2019-10-17 DIAGNOSIS — E785 Hyperlipidemia, unspecified: Secondary | ICD-10-CM | POA: Diagnosis present

## 2019-10-17 DIAGNOSIS — I10 Essential (primary) hypertension: Secondary | ICD-10-CM | POA: Diagnosis present

## 2019-10-17 DIAGNOSIS — E039 Hypothyroidism, unspecified: Secondary | ICD-10-CM | POA: Diagnosis present

## 2019-10-17 DIAGNOSIS — E871 Hypo-osmolality and hyponatremia: Secondary | ICD-10-CM | POA: Diagnosis present

## 2019-10-17 DIAGNOSIS — Z6823 Body mass index (BMI) 23.0-23.9, adult: Secondary | ICD-10-CM | POA: Diagnosis not present

## 2019-10-17 LAB — CBC WITH DIFFERENTIAL/PLATELET
Abs Immature Granulocytes: 0.01 10*3/uL (ref 0.00–0.07)
Basophils Absolute: 0.1 10*3/uL (ref 0.0–0.1)
Basophils Relative: 1 %
Eosinophils Absolute: 0.1 10*3/uL (ref 0.0–0.5)
Eosinophils Relative: 2 %
HCT: 40.5 % (ref 36.0–46.0)
Hemoglobin: 14.4 g/dL (ref 12.0–15.0)
Immature Granulocytes: 0 %
Lymphocytes Relative: 16 %
Lymphs Abs: 1 10*3/uL (ref 0.7–4.0)
MCH: 29.9 pg (ref 26.0–34.0)
MCHC: 35.6 g/dL (ref 30.0–36.0)
MCV: 84.2 fL (ref 80.0–100.0)
Monocytes Absolute: 0.8 10*3/uL (ref 0.1–1.0)
Monocytes Relative: 13 %
Neutro Abs: 4.3 10*3/uL (ref 1.7–7.7)
Neutrophils Relative %: 68 %
Platelets: 334 10*3/uL (ref 150–400)
RBC: 4.81 MIL/uL (ref 3.87–5.11)
RDW: 12.1 % (ref 11.5–15.5)
WBC: 6.2 10*3/uL (ref 4.0–10.5)
nRBC: 0 % (ref 0.0–0.2)

## 2019-10-17 LAB — COMPREHENSIVE METABOLIC PANEL
ALT: 17 U/L (ref 0–44)
AST: 24 U/L (ref 15–41)
Albumin: 3.9 g/dL (ref 3.5–5.0)
Alkaline Phosphatase: 101 U/L (ref 38–126)
Anion gap: 10 (ref 5–15)
BUN: 5 mg/dL — ABNORMAL LOW (ref 8–23)
CO2: 23 mmol/L (ref 22–32)
Calcium: 9.2 mg/dL (ref 8.9–10.3)
Chloride: 93 mmol/L — ABNORMAL LOW (ref 98–111)
Creatinine, Ser: 0.81 mg/dL (ref 0.44–1.00)
GFR calc Af Amer: >60 mL/min (ref >60)
GFR calc non Af Amer: >60 mL/min (ref >60)
Glucose, Bld: 128 mg/dL — ABNORMAL HIGH (ref 70–99)
Potassium: 3.7 mmol/L (ref 3.5–5.1)
Sodium: 126 mmol/L — ABNORMAL LOW (ref 135–145)
Total Bilirubin: 0.9 mg/dL (ref 0.3–1.2)
Total Protein: 6.6 g/dL (ref 6.5–8.1)

## 2019-10-17 MED ORDER — DIPHENHYDRAMINE HCL 12.5 MG/5ML PO ELIX
12.5000 mg | ORAL_SOLUTION | Freq: Once | ORAL | Status: AC
  Administered 2019-10-17: 18:00:00 12.5 mg via ORAL
  Filled 2019-10-17: qty 10

## 2019-10-17 MED ORDER — DIPHENHYDRAMINE HCL 12.5 MG/5ML PO ELIX: 12.5000 mg | ORAL_SOLUTION | Freq: Once | ORAL | Status: DC

## 2019-10-17 MED ORDER — DIPHENHYDRAMINE HCL 12.5 MG/5ML PO ELIX
12.5000 mg | ORAL_SOLUTION | Freq: Once | ORAL | Status: AC
  Administered 2019-10-17: 12.5 mg via ORAL
  Filled 2019-10-17: qty 10

## 2019-10-17 NOTE — Telephone Encounter (Signed)
Called by EDP for advise. Chart reviewed. Patient with EOE treated with dilation and steroid inhalers presents to ER complaining of pharyngeal discomfort and fullness. Has been on nystatin swish and swallow for presumed Candida. I'm told, in no distress, not drooling, and handling liquids. EDP does not feel that she has an impaction or needs an EGD. Was treated with benadryl liquid. Plans for discharge, but wanted a follow up plan. Advised continue on liquids and nystatin therapy. Will forward to Dr. Fuller Plan and his RN. Please reach out to patient in am with further advise or recommendations.

## 2019-10-17 NOTE — ED Provider Notes (Signed)
[facility] EMERGENCY DEPARTMENT Provider Note   CSN: [id number] Arrival date & time: 10/17/19  1626     History Chief Complaint  Patient presents with  . Dysphagia  . Shortness of Breath    Crystal Kidd is a 74 y.o. female.  HPI She presents for evaluation of difficulty swallowing, for the last couple weeks, and has been treated for same, with 2 types of mouthwash, and a pill.  She states she was doing better earlier today but then had difficulty later so decided come here for evaluation.  She complains of pain, and her posterior pharynx with attempts at swallowing.  She denies fever, chills, cough, shortness of breath, focal weakness or paresthesia.  There are no other known modifying factors.  Past Medical History:  Diagnosis Date  . Allergic rhinoconjunctivitis   . Allergy 1992  . Arthritis   . Cataract   . Diverticulosis   . Eosinophilic esophagitis   . GERD (gastroesophageal reflux disease)    PMH of esophageal stricture  . Hyperlipidemia   . Hypertension   . Osteopenia    BMD @[facility]   . Thyroid disease    hypothyroidism  . Transfusion history 1968   post partum  . Trapezius muscle spasm 04/13/2015  . Vitiligo     Patient Active Problem List   Diagnosis Date Noted  . Oropharyngeal dysphagia 10/14/2019  . Personal history of colonic polyps 07/20/2019  . Sinus congestion 07/19/2019  . Tubular adenoma of colon 02/24/2019  . History of vertigo 06/26/2018  . Family history of breast cancer in mother 12/21/2016  . S/P laparoscopic cholecystectomy 06/04/2016  . Thoracic aortic atherosclerosis ([place]) 01/02/2016  . Encounter for preventive health examination 06/14/2014  . Pain in joint, shoulder region 12/06/2013  . S/P hysterectomy with oophorectomy 12/06/2013  . Cataract 12/06/2013  . Transfusion history   . Cervical disc disorder with radiculopathy of cervical region 05/21/2010  . Esophageal dysphagia 05/31/2009  . VITAMIN D DEFICIENCY  05/16/2009  . Hypothyroidism 12/26/2007  . Hyperlipidemia LDL goal <100 12/26/2007  . Essential hypertension 12/26/2007  . GERD 12/26/2007  . Diverticulosis of large intestine 12/26/2007  . VITILIGO 12/31/2006  . OSTEOPENIA 12/31/2006    Past Surgical History:  Procedure Laterality Date  . ABDOMINAL HYSTERECTOMY     for dysfunctional menses; USO with TAH  . APPENDECTOMY    . BREAST EXCISIONAL BIOPSY Right 1972   NEG  . BREAST EXCISIONAL BIOPSY Left 1980'S   NEG  . BREAST SURGERY     biopsy X 2  . CHOLECYSTECTOMY N/A 04/30/2016   Procedure: LAPAROSCOPIC CHOLECYSTECTOMY;  Surgeon: Georganna Skeans, MD;  Location: [facility];  Service: General;  Laterality: N/A;  . COLONOSCOPY     X2; Tics; Dr Fuller Plan, GI  . ESOPHAGEAL DILATION     X2  . EYE SURGERY    . FRACTURE SURGERY  2003or 2004  . ROTATOR CUFF REPAIR     R shoulder  . TUBAL LIGATION     befor hysterectomy  . UPPER GASTROINTESTINAL ENDOSCOPY       OB History   No obstetric history on file.     Family History  Problem Relation Age of Onset  . Breast cancer Sister        mammograms @ [facility]  . Colon polyps Sister   . COPD Father   . Cancer Mother        breast&stomach  . Stomach cancer Mother   . Breast cancer Mother   .  Colon cancer Maternal Aunt   . Cancer Maternal Aunt 80       colon cA  . Coronary artery disease Brother   . COPD Brother   . Colon polyps Brother   . COPD Son   . Diabetes Neg Hx   . Stroke Neg Hx   . Heart disease Neg Hx   . Esophageal cancer Neg Hx   . Rectal cancer Neg Hx     Social History   Tobacco Use  . Smoking status: Never Smoker  . Smokeless tobacco: Never Used  Substance Use Topics  . Alcohol use: Yes    Comment: have wine 1 glass every 6 months  . Drug use: No    Home Medications Prior to Admission medications   Medication Sig Start Date End Date Taking? Authorizing Provider  acetaminophen (TYLENOL) 500 MG tablet Take 500 mg by mouth every 6 (six) hours as needed for  headache (pain).    [provider]  benzonatate (TESSALON) 100 MG capsule TAKE 2 CAPSULES (200 MG TOTAL) BY MOUTH 3 (THREE) TIMES DAILY AS NEEDED FOR COUGH. 11/04/17   [name], MD  calcium citrate-vitamin D (CITRACAL+D) 315-200 MG-UNIT tablet Take 1 tablet by mouth 2 (two) times daily.    [provider]  cholecalciferol (VITAMIN D) 1000 units tablet Take 1,000 Units by mouth 2 (two) times daily.    [provider]  fluconazole (DIFLUCAN) 100 MG tablet Take 2 tablets (200 mg total) by mouth daily for 1 day, THEN 1 tablet (100 mg total) daily for 6 days. 10/15/19 10/22/19  Zehr, Laban Emperor, PA-C  fluticasone (FLONASE) 50 MCG/ACT nasal spray Place 2 sprays into both nostrils daily. 07/31/18   [name], MD  fluticasone (FLOVENT HFA) 220 MCG/ACT inhaler Swallow ( not inhale) two puffs twice a day 10/01/19   Paulette Blanch, MD  gabapentin (NEURONTIN) 100 MG capsule TAKE 1 CAPSULE (100 MG TOTAL) BY MOUTH 3 (THREE) TIMES DAILY. EVERY 8 HOURS AS NEEDED 04/22/19   [name], MD  hydrochlorothiazide (HYDRODIURIL) 25 MG tablet TAKE 1/2 TABLET DAILY AS NEEDED 01/13/17   [name], MD  ibuprofen (ADVIL,MOTRIN) 200 MG tablet Take 200 mg by mouth every 6 (six) hours as needed for moderate pain.     [provider]  levothyroxine (SYNTHROID) 75 MCG tablet TAKE 1 TABLET (75 MCG TOTAL) BY MOUTH DAILY BEFORE BREAKFAST. 08/11/19   [name], MD  loratadine (CLARITIN) 10 MG tablet Take 10 mg by mouth daily.    [provider]  losartan (COZAAR) 100 MG tablet TAKE 1 TABLET EVERY DAY 07/21/19   [name], MD  magic mouthwash SOLN Take 5 mLs by mouth 3 (three) times daily as needed for mouth pain. 10/01/19   Paulette Blanch, MD  metoCLOPramide (REGLAN) 10 MG tablet Take one tablet by mouth by mouth one hour prior to each prep dose. 09/16/19   Ladene Artist, MD  nystatin (MYCOSTATIN) 100000 UNIT/ML suspension Take 5 mLs (500,000 Units total) by mouth 3  (three) times daily. SWISH AND SWALLOW THREE TIMES A DAY 10/11/19   Zehr, Laban Emperor, PA-C  omeprazole (PRILOSEC) 40 MG capsule TAKE 1 CAPSULE EVERY DAY 06/28/19   [name], MD  ondansetron (ZOFRAN ODT) 4 MG disintegrating tablet Take 1 tablet (4 mg total) by mouth every 8 (eight) hours as needed for nausea or vomiting. 07/19/19   [name], MD  Polyethyl Glycol-Propyl Glycol (SYSTANE OP) Place  1 drop into both eyes 4 (four) times daily as needed (dry eyes).    [provider]  polyethylene glycol (MIRALAX / GLYCOLAX) 17 g packet Take 17 g by mouth daily.    [provider]  simvastatin (ZOCOR) 20 MG tablet TAKE 1 TABLET EVERY EVENING 05/10/19   [name], MD    Allergies    Besivance [besifloxacin hcl], Alendronate sodium, Dilaudid [hydromorphone hcl], Codeine, Flonase [fluticasone], and Ibandronate sodium  Review of Systems   Review of Systems  All other systems reviewed and are negative.   Physical Exam Updated Vital Signs BP (!) 146/75   Pulse 95   Temp 98.3 F (36.8 C) (Oral)   Resp 20   Ht 5\' 3"  (1.6 m)   Wt 59.4 kg   SpO2 97%   BMI 23.21 kg/m   Physical Exam Vitals and nursing note reviewed.  Constitutional:      General: She is not in acute distress.    Appearance: She is well-developed. She is not ill-appearing, toxic-appearing or diaphoretic.  HENT:     Head: Normocephalic and atraumatic.     Right Ear: External ear normal.     Left Ear: External ear normal.     Mouth/Throat:     Mouth: Mucous membranes are moist.     Pharynx: Oropharynx is clear. No oropharyngeal exudate or posterior oropharyngeal erythema.     Comments: No visible oral lesions. Eyes:     Conjunctiva/sclera: Conjunctivae normal.     Pupils: Pupils are equal, round, and reactive to light.  Neck:     Trachea: Phonation normal.  Cardiovascular:     Rate and Rhythm: Normal rate and regular rhythm.     Heart sounds: Normal heart sounds.  Pulmonary:     Effort:  Pulmonary effort is normal.     Breath sounds: Normal breath sounds.  Abdominal:     General: There is no distension.     Palpations: Abdomen is soft.     Tenderness: There is no abdominal tenderness.  Musculoskeletal:        General: Normal range of motion.     Cervical back: Normal range of motion and neck supple.  Skin:    General: Skin is warm and dry.  Neurological:     Mental Status: She is alert and oriented to person, place, and time.     Cranial Nerves: No cranial nerve deficit.     Sensory: No sensory deficit.     Motor: No abnormal muscle tone.     Coordination: Coordination normal.  Psychiatric:        Mood and Affect: Mood normal.        Behavior: Behavior normal.        Thought Content: Thought content normal.        Judgment: Judgment normal.     ED Results / Procedures / Treatments   Labs (all labs ordered are listed, but only abnormal results are displayed) Labs Reviewed  COMPREHENSIVE METABOLIC PANEL - Abnormal; Notable for the following components:      Result Value   Sodium 126 (*)    Chloride 93 (*)    Glucose, Bld 128 (*)    BUN 5 (*)    All other components within normal limits  CBC WITH DIFFERENTIAL/PLATELET    EKG None  Radiology DG Chest 2 View  Result Date: 10/17/2019 CLINICAL DATA:  Difficulty swallowing. EXAM: CHEST - 2 VIEW COMPARISON:  Chest x-ray 05/14/2016 FINDINGS: The cardiac silhouette,  mediastinal and hilar contours are normal and stable. There is mild tortuosity and calcification of the thoracic aorta. Mild chronic bronchitic type interstitial lung changes but no infiltrates, edema or effusions. The bony thorax is intact. IMPRESSION: No acute cardiopulmonary findings. Electronically Signed   By: Marijo Sanes M.D.   On: 10/17/2019 17:59    Procedures Procedures (including critical care time)  Medications Ordered in ED Medications  diphenhydrAMINE (BENADRYL) 12.5 MG/5ML elixir 12.5 mg (12.5 mg Oral Given 10/17/19 1730)   diphenhydrAMINE (BENADRYL) 12.5 MG/5ML elixir 12.5 mg (12.5 mg Oral Given 10/17/19 1816)    ED Course  I have reviewed the triage vital signs and the nursing notes.  Pertinent labs & imaging results that were available during my care of the patient were reviewed by me and considered in my medical decision making (see chart for details).  Clinical Course as of Oct 17 1907  Sun Oct 17, 2019  1906 Discussed the case findings with Dr. Henrene Pastor, gastroenterologist on-call for her doctor.  He will message Dr. Fuller Plan, to get him involved with her care tomorrow morning.  Patient is to call for guidance, after 830.  In the meantime she should continue taking the nystatin and stay on a clear liquid diet.   [EW]    Clinical Course User Index [EW] Daleen Bo, MD   MDM Rules/Calculators/A&P                        Patient Vitals for the past 24 hrs:  BP Temp Temp src Pulse Resp SpO2 Height Weight  10/17/19 1830 - - - 95 - 97 % - -  10/17/19 1816 (!) 146/75 - - 84 - 97 % - -  10/17/19 1730 139/64 - - 78 - 97 % - -  10/17/19 1700 (!) 146/96 - - 88 - 97 % - -  10/17/19 1657 139/75 - - 82 - 98 % 5\' 3"  (1.6 m) 59.4 kg  10/17/19 1638 133/83 98.3 F (36.8 C) Oral 96 20 98 % - -    At discharge- reevaluation with update and discussion. After initial assessment and treatment, an updated evaluation reveals she is able to drink some fluid with burping and belching.  She is swallowing and controlling her saliva.  Findings discussed with the patient and all questions were answered. Daleen Bo   Medical Decision Making: Dysphagia, apparently initiated when using Flovent inhaler, resulting oral sores.  Symptoms improving, currently being treated with nystatin swish and swallow, and Diflucan.  Patient worsens some today, and presents for painful swallowing.  I doubt esophageal obstruction, ACS or serious bacterial infection.  RANIQUE QUILLEN was evaluated in Emergency Department on 10/17/2019 for the symptoms  described in the history of present illness. She was evaluated in the context of the global COVID-19 pandemic, which necessitated consideration that the patient might be at risk for infection with the SARS-CoV-2 virus that causes COVID-19. Institutional protocols and algorithms that pertain to the evaluation of patients at risk for COVID-19 are in a state of rapid change based on information released by regulatory bodies including the CDC and federal and state organizations. These policies and algorithms were followed during the patient's care in the ED.  CRITICAL CARE-no Performed by: Daleen Bo  Nursing Notes Reviewed/ Care Coordinated Applicable Imaging Reviewed Interpretation of Laboratory Data incorporated into ED treatment  The patient appears reasonably screened and/or stabilized for discharge and I doubt any other medical condition or other [facility] requiring further  screening, evaluation, or treatment in the ED at this time prior to discharge.  Plan: Home Medications-continue current medicine but hold Diflucan for tonight; Home Treatments-liquid diet only until seen by GI; return here if the recommended treatment, does not improve the symptoms; Recommended follow up-call GI in morning for follow-up planning.     Final Clinical Impression(s) / ED Diagnoses Final diagnoses:  Dysphagia, unspecified type    Rx / DC Orders ED Discharge Orders    None       Daleen Bo, MD 10/17/19 2035

## 2019-10-17 NOTE — Discharge Instructions (Signed)
Stay on a liquid diet, for now.  Continue using the nystatin.  You do not have to take the Diflucan since it is hard to swallow a pill, at this time.

## 2019-10-17 NOTE — ED Triage Notes (Signed)
Pt arrives pov with reports of oral yeast infection. Pt states has been treated with several different medications over the last few week for same. Pt endorses trouble swallowing saliva and endorses shob. Pt speaking in complete sentences. States symptoms worsened this afternoon.

## 2019-10-17 NOTE — ED Notes (Signed)
Patient verbalizes understanding of discharge instructions. Opportunity for questioning and answers were provided. Armband removed by staff, pt discharged from ED.  

## 2019-10-18 ENCOUNTER — Other Ambulatory Visit: Payer: Self-pay

## 2019-10-18 ENCOUNTER — Encounter (HOSPITAL_COMMUNITY): Payer: Self-pay

## 2019-10-18 ENCOUNTER — Inpatient Hospital Stay (HOSPITAL_COMMUNITY)
Admission: EM | Admit: 2019-10-18 | Discharge: 2019-10-22 | DRG: 392 | Disposition: A | Discharge status: Home / Self Care | Payer: Medicare HMO | Attending: Internal Medicine | Admitting: Internal Medicine

## 2019-10-18 DIAGNOSIS — Z20822 Contact with and (suspected) exposure to covid-19: Secondary | ICD-10-CM | POA: Diagnosis present

## 2019-10-18 DIAGNOSIS — R1319 Other dysphagia: Secondary | ICD-10-CM

## 2019-10-18 DIAGNOSIS — I10 Essential (primary) hypertension: Secondary | ICD-10-CM | POA: Diagnosis present

## 2019-10-18 DIAGNOSIS — K224 Dyskinesia of esophagus: Principal | ICD-10-CM | POA: Diagnosis present

## 2019-10-18 DIAGNOSIS — E871 Hypo-osmolality and hyponatremia: Secondary | ICD-10-CM | POA: Diagnosis present

## 2019-10-18 DIAGNOSIS — N179 Acute kidney failure, unspecified: Secondary | ICD-10-CM | POA: Diagnosis present

## 2019-10-18 DIAGNOSIS — R1310 Dysphagia, unspecified: Secondary | ICD-10-CM

## 2019-10-18 DIAGNOSIS — Z79899 Other long term (current) drug therapy: Secondary | ICD-10-CM

## 2019-10-18 DIAGNOSIS — E876 Hypokalemia: Secondary | ICD-10-CM | POA: Diagnosis not present

## 2019-10-18 DIAGNOSIS — E785 Hyperlipidemia, unspecified: Secondary | ICD-10-CM | POA: Diagnosis present

## 2019-10-18 DIAGNOSIS — E034 Atrophy of thyroid (acquired): Secondary | ICD-10-CM

## 2019-10-18 DIAGNOSIS — J029 Acute pharyngitis, unspecified: Secondary | ICD-10-CM | POA: Diagnosis not present

## 2019-10-18 DIAGNOSIS — K2970 Gastritis, unspecified, without bleeding: Secondary | ICD-10-CM | POA: Diagnosis present

## 2019-10-18 DIAGNOSIS — K222 Esophageal obstruction: Secondary | ICD-10-CM | POA: Diagnosis present

## 2019-10-18 DIAGNOSIS — K1370 Unspecified lesions of oral mucosa: Secondary | ICD-10-CM | POA: Diagnosis present

## 2019-10-18 DIAGNOSIS — Z888 Allergy status to other drugs, medicaments and biological substances status: Secondary | ICD-10-CM

## 2019-10-18 DIAGNOSIS — R627 Adult failure to thrive: Secondary | ICD-10-CM | POA: Diagnosis present

## 2019-10-18 DIAGNOSIS — Z79890 Hormone replacement therapy (postmenopausal): Secondary | ICD-10-CM

## 2019-10-18 DIAGNOSIS — E039 Hypothyroidism, unspecified: Secondary | ICD-10-CM | POA: Diagnosis present

## 2019-10-18 DIAGNOSIS — Z86010 Personal history of colonic polyps: Secondary | ICD-10-CM

## 2019-10-18 DIAGNOSIS — K200 Eosinophilic esophagitis: Secondary | ICD-10-CM | POA: Diagnosis present

## 2019-10-18 DIAGNOSIS — K219 Gastro-esophageal reflux disease without esophagitis: Secondary | ICD-10-CM | POA: Diagnosis present

## 2019-10-18 DIAGNOSIS — Z885 Allergy status to narcotic agent status: Secondary | ICD-10-CM

## 2019-10-18 DIAGNOSIS — Z6823 Body mass index (BMI) 23.0-23.9, adult: Secondary | ICD-10-CM

## 2019-10-18 DIAGNOSIS — M8580 Other specified disorders of bone density and structure, unspecified site: Secondary | ICD-10-CM | POA: Diagnosis present

## 2019-10-18 MED ORDER — SODIUM CHLORIDE 0.9 % IV BOLUS (SEPSIS)
500.0000 mL | Freq: Once | INTRAVENOUS | Status: AC
  Administered 2019-10-19: 500 mL via INTRAVENOUS

## 2019-10-18 MED ORDER — SODIUM CHLORIDE 0.9 % IV SOLN: INTRAVENOUS | Status: DC

## 2019-10-18 NOTE — Telephone Encounter (Signed)
Pt requested to speak to a nurse.  She stated that she is unable to eat or drink.

## 2019-10-18 NOTE — Telephone Encounter (Signed)
Agree with Nystatin for suspected candida. Liquid diet. Cancel colonoscopy and schedule EGD in its place on Wednesday.

## 2019-10-18 NOTE — Telephone Encounter (Signed)
Thanks Sheri, Sorry to hear this. Sounds like this happened after treatment with fluticasone for EoE. Did she take the diflucan or was she not able to tolerate it? If she has not taken diflucan would have her do so unless she is unable to tolerate the pills. Agree with Nystatin swish and swallow otherwise. If symptoms are really limiting her PO intake and not responding to nystatin/ fluconazole could switch the colonoscopy on Wed to EGD with Dr. Fuller Plan to further evaluate. She should stay on liquid diet until that time, if not able to tolerate liquids in the interim she would need to be admitted. Can you call her tomorrow AM to see how she is doing after a day or 2 of therapy? If things improve may be able to forgo the EGD.

## 2019-10-18 NOTE — Telephone Encounter (Signed)
Patient has a hx with Dr. Fuller Plan and diagnosed with EoE.  She was evaluated by Alonza Bogus, PA on 2/21 for severe dysphagia and started on diflucan.  Evaluated in the ED and started on magic mouthwash, but did not take for long due to cost.  She was in the ED again yesterday for inability to eat or drink.  She was discharged last night and told to follow up with the office this am.  Patient reports that she is on the clear liquid diet and nystatin as prescribed last night, but can't drink much more than coffee.  She is scheduled for colonoscopy on Wed, but fears she will not be able to drink the prep.  There are no APP openings until Wed and Dr. Fuller Plan is off today.  Dr. Havery Moros you are MD of the day, please advise.

## 2019-10-18 NOTE — Telephone Encounter (Signed)
Patient is not able to swallow her meds.  She will try liquids today.  She and I will talk in the am after a day of Nystatin.  She verbalized understanding.

## 2019-10-18 NOTE — ED Provider Notes (Addendum)
TIME SEEN: 11:15 PM  CHIEF COMPLAINT: Dysphagia  HPI: Patient is a 74 year old with history of eosinophilic esophagitis who presents to the emergency department with progressively worsening dysphagia over the past 1 to 2 weeks.  Now unable to tolerate any liquids.  Reports she has not urinated since 10 AM.  Has been on Diflucan, Magic mouthwash and nystatin.  States she is only able to swallow a small amount of nystatin and.  She is unable to eat any foods.  Was scheduled to have endoscopy with Dr. Fuller Plan on Wednesday.  No fevers, vomiting or diarrhea.  ROS: See HPI Constitutional: no fever  Eyes: no drainage  ENT: no runny nose   Cardiovascular:  no chest pain  Resp: no SOB  GI: no vomiting GU: no dysuria Integumentary: no rash  Allergy: no hives  Musculoskeletal: no leg swelling  Neurological: no slurred speech ROS otherwise negative  PAST MEDICAL HISTORY/PAST SURGICAL HISTORY:  Past Medical History:  Diagnosis Date  . Allergic rhinoconjunctivitis   . Allergy 1992  . Arthritis   . Cataract   . Diverticulosis   . Eosinophilic esophagitis   . GERD (gastroesophageal reflux disease)    PMH of esophageal stricture  . Hyperlipidemia   . Hypertension   . Osteopenia    BMD @[facility]   . Thyroid disease    hypothyroidism  . Transfusion history 1968   post partum  . Trapezius muscle spasm 04/13/2015  . Vitiligo     MEDICATIONS:  Prior to Admission medications   Medication Sig Start Date End Date Taking? Authorizing Provider  acetaminophen (TYLENOL) 500 MG tablet Take 500 mg by mouth every 6 (six) hours as needed for headache (pain).    [provider]  benzonatate (TESSALON) 100 MG capsule TAKE 2 CAPSULES (200 MG TOTAL) BY MOUTH 3 (THREE) TIMES DAILY AS NEEDED FOR COUGH. 11/04/17   [name], MD  calcium citrate-vitamin D (CITRACAL+D) 315-200 MG-UNIT tablet Take 1 tablet by mouth 2 (two) times daily.    [provider]  cholecalciferol (VITAMIN D) 1000 units  tablet Take 1,000 Units by mouth 2 (two) times daily.    [provider]  fluconazole (DIFLUCAN) 100 MG tablet Take 2 tablets (200 mg total) by mouth daily for 1 day, THEN 1 tablet (100 mg total) daily for 6 days. 10/15/19 10/22/19  Zehr, Laban Emperor, PA-C  fluticasone (FLONASE) 50 MCG/ACT nasal spray Place 2 sprays into both nostrils daily. 07/31/18   [name], MD  fluticasone (FLOVENT HFA) 220 MCG/ACT inhaler Swallow ( not inhale) two puffs twice a day 10/01/19   Paulette Blanch, MD  gabapentin (NEURONTIN) 100 MG capsule TAKE 1 CAPSULE (100 MG TOTAL) BY MOUTH 3 (THREE) TIMES DAILY. EVERY 8 HOURS AS NEEDED 04/22/19   [name], MD  hydrochlorothiazide (HYDRODIURIL) 25 MG tablet TAKE 1/2 TABLET DAILY AS NEEDED 01/13/17   [name], MD  ibuprofen (ADVIL,MOTRIN) 200 MG tablet Take 200 mg by mouth every 6 (six) hours as needed for moderate pain.     [provider]  levothyroxine (SYNTHROID) 75 MCG tablet TAKE 1 TABLET (75 MCG TOTAL) BY MOUTH DAILY BEFORE BREAKFAST. 08/11/19   [name], MD  loratadine (CLARITIN) 10 MG tablet Take 10 mg by mouth daily.    [provider]  losartan (COZAAR) 100 MG tablet TAKE 1 TABLET EVERY DAY 07/21/19   [name], MD  magic mouthwash SOLN Take 5 mLs by mouth 3 (three) times daily as needed  for mouth pain. 10/01/19   Paulette Blanch, MD  metoCLOPramide (REGLAN) 10 MG tablet Take one tablet by mouth by mouth one hour prior to each prep dose. 09/16/19   Ladene Artist, MD  nystatin (MYCOSTATIN) 100000 UNIT/ML suspension Take 5 mLs (500,000 Units total) by mouth 3 (three) times daily. SWISH AND SWALLOW THREE TIMES A DAY 10/11/19   Zehr, Laban Emperor, PA-C  omeprazole (PRILOSEC) 40 MG capsule TAKE 1 CAPSULE EVERY DAY 06/28/19   [name], MD  ondansetron (ZOFRAN ODT) 4 MG disintegrating tablet Take 1 tablet (4 mg total) by mouth every 8 (eight) hours as needed for nausea or vomiting. 07/19/19   [name], MD  Polyethyl  Glycol-Propyl Glycol (SYSTANE OP) Place 1 drop into both eyes 4 (four) times daily as needed (dry eyes).    [provider]  polyethylene glycol (MIRALAX / GLYCOLAX) 17 g packet Take 17 g by mouth daily.    [provider]  simvastatin (ZOCOR) 20 MG tablet TAKE 1 TABLET EVERY EVENING 05/10/19   [name], MD    ALLERGIES:  Allergies  Allergen Reactions  . Besivance [Besifloxacin Hcl] Shortness Of Breath and Other (See Comments)    Dizziness  . Alendronate Sodium Other (See Comments)    REACTION: intolerant ( PMH of esophageal stricture)  . Dilaudid [Hydromorphone Hcl] Itching    Nasal itching; resolution with Benadryl  . Codeine Nausea And Vomiting and Other (See Comments)    hallucinations  . Flonase [Fluticasone]     Mouth blisters  . Ibandronate Sodium Other (See Comments)     bone pain    SOCIAL HISTORY:  Social History   Tobacco Use  . Smoking status: Never Smoker  . Smokeless tobacco: Never Used  Substance Use Topics  . Alcohol use: Yes    Comment: have wine 1 glass every 6 months    FAMILY HISTORY: Family History  Problem Relation Age of Onset  . Breast cancer Sister        mammograms @ [facility]  . Colon polyps Sister   . COPD Father   . Cancer Mother        breast&stomach  . Stomach cancer Mother   . Breast cancer Mother   . Colon cancer Maternal Aunt   . Cancer Maternal Aunt 80       colon cA  . Coronary artery disease Brother   . COPD Brother   . Colon polyps Brother   . COPD Son   . Diabetes Neg Hx   . Stroke Neg Hx   . Heart disease Neg Hx   . Esophageal cancer Neg Hx   . Rectal cancer Neg Hx     EXAM: BP 106/69   Pulse 94   Temp 98 F (36.7 C) (Oral)   Resp 17   SpO2 96%  CONSTITUTIONAL: Alert and oriented and responds appropriately to questions. Well-appearing; well-nourished HEAD: Normocephalic EYES: Conjunctivae clear, pupils appear equal, EOM appear intact ENT: normal nose; patient has dry mucous membranes, no  lesions in the mouth or posterior oropharynx, uvula is midline without swelling, normal phonation, no stridor, no trismus, no drooling, currently handling her secretions NECK: Supple, normal ROM CARD: RRR; S1 and S2 appreciated; no murmurs, no clicks, no rubs, no gallops RESP: Normal chest excursion without splinting or tachypnea; breath sounds clear and equal bilaterally; no wheezes, no rhonchi, no rales, no hypoxia or respiratory distress, speaking full sentences ABD/GI: Normal bowel sounds; non-distended; soft, non-tender, no  rebound, no guarding, no peritoneal signs, no hepatosplenomegaly BACK:  The back appears normal EXT: Normal ROM in all joints; no deformity noted, no edema; no cyanosis SKIN: Normal color for age and race; warm; no rash on exposed skin NEURO: Moves all extremities equally PSYCH: The patient's mood and manner are appropriate.   MEDICAL DECISION MAKING: Patient here with worsening dysphagia now to the point that she is unable to swallow any liquids.  Unable to swallow Diflucan tablets.  Unable to swallow nystatin or Magic mouthwash.  She appears dry on exam and has not urinated since 10 AM.  Denies any history of esophageal food bolus.  Scheduled for endoscopy on Wednesday but it appears per her GI physician if she was unable to tolerate liquids they recommended return to the ED for admission to medicine and I agree with this plan.  Will repeat labs and give IV fluids.  Labs yesterday show sodium 126 but otherwise no significant abnormality.  ED PROGRESS: 11:25 PM  Spoke with Dr. Bryan Lemma with [facility].  He agrees with medicine admission.  Will see patient in morning.   12:05 AM Discussed patient's case with hospitalist, Dr. Myna Hidalgo.  I have recommended admission and patient (and family if present) agree with this plan. Admitting physician will place admission orders.   Sodium has improved to 133 but creatinine has gone up from 0.81 to 1.53 likely from dehydration.  She is  receiving IV fluids.  I reviewed all nursing notes, vitals, pertinent previous records and interpreted all EKGs, lab and urine results, imaging (as available).       Crystal Kidd was evaluated in Emergency Department on 10/18/2019 for the symptoms described in the history of present illness. She was evaluated in the context of the global COVID-19 pandemic, which necessitated consideration that the patient might be at risk for infection with the SARS-CoV-2 virus that causes COVID-19. Institutional protocols and algorithms that pertain to the evaluation of patients at risk for COVID-19 are in a state of rapid change based on information released by regulatory bodies including the CDC and federal and state organizations. These policies and algorithms were followed during the patient's care in the ED.  Patient was seen wearing N95, face shield, gloves.    Samadhi Mahurin, Delice Bison, DO 10/19/19 0232    Spencer Peterkin, Delice Bison, DO 10/19/19 2327

## 2019-10-18 NOTE — Telephone Encounter (Addendum)
Patient just called she reports this am she was able to drink gatorade and get some apple sauce down without difficulty.  She has tried to drink again and nothing will pass.  She is gagging when she attempts water and it will not pass. She is advised to return to the ED for evaluation.

## 2019-10-18 NOTE — ED Triage Notes (Signed)
Pt reports on going weakness and inability to swallow x3 weeks. Pt reports a lot of "mucus" in her throat. This is her 3rd ED visit for the same. States she's unable to swallow her pills. dc'd from our ED yesterday for the same

## 2019-10-19 ENCOUNTER — Observation Stay (HOSPITAL_COMMUNITY): Payer: Medicare HMO | Admitting: Anesthesiology

## 2019-10-19 ENCOUNTER — Encounter (HOSPITAL_COMMUNITY)
Admission: EM | Disposition: A | Discharge status: Home / Self Care | Payer: Self-pay | Source: Home / Self Care | Attending: Internal Medicine

## 2019-10-19 ENCOUNTER — Encounter (HOSPITAL_COMMUNITY): Payer: Self-pay | Admitting: Family Medicine

## 2019-10-19 DIAGNOSIS — Z888 Allergy status to other drugs, medicaments and biological substances status: Secondary | ICD-10-CM | POA: Diagnosis not present

## 2019-10-19 DIAGNOSIS — N179 Acute kidney failure, unspecified: Secondary | ICD-10-CM | POA: Diagnosis present

## 2019-10-19 DIAGNOSIS — K200 Eosinophilic esophagitis: Secondary | ICD-10-CM | POA: Diagnosis present

## 2019-10-19 DIAGNOSIS — Z20822 Contact with and (suspected) exposure to covid-19: Secondary | ICD-10-CM | POA: Diagnosis present

## 2019-10-19 DIAGNOSIS — R1310 Dysphagia, unspecified: Secondary | ICD-10-CM | POA: Diagnosis not present

## 2019-10-19 DIAGNOSIS — K2970 Gastritis, unspecified, without bleeding: Secondary | ICD-10-CM | POA: Diagnosis present

## 2019-10-19 DIAGNOSIS — K1370 Unspecified lesions of oral mucosa: Secondary | ICD-10-CM | POA: Diagnosis present

## 2019-10-19 DIAGNOSIS — E785 Hyperlipidemia, unspecified: Secondary | ICD-10-CM | POA: Diagnosis present

## 2019-10-19 DIAGNOSIS — E039 Hypothyroidism, unspecified: Secondary | ICD-10-CM | POA: Diagnosis present

## 2019-10-19 DIAGNOSIS — K222 Esophageal obstruction: Secondary | ICD-10-CM | POA: Diagnosis present

## 2019-10-19 DIAGNOSIS — R627 Adult failure to thrive: Secondary | ICD-10-CM | POA: Diagnosis present

## 2019-10-19 DIAGNOSIS — E871 Hypo-osmolality and hyponatremia: Secondary | ICD-10-CM | POA: Diagnosis present

## 2019-10-19 DIAGNOSIS — M8580 Other specified disorders of bone density and structure, unspecified site: Secondary | ICD-10-CM | POA: Diagnosis present

## 2019-10-19 DIAGNOSIS — J029 Acute pharyngitis, unspecified: Secondary | ICD-10-CM | POA: Diagnosis not present

## 2019-10-19 DIAGNOSIS — E876 Hypokalemia: Secondary | ICD-10-CM | POA: Diagnosis not present

## 2019-10-19 DIAGNOSIS — Z6823 Body mass index (BMI) 23.0-23.9, adult: Secondary | ICD-10-CM | POA: Diagnosis not present

## 2019-10-19 DIAGNOSIS — R1319 Other dysphagia: Secondary | ICD-10-CM | POA: Diagnosis present

## 2019-10-19 DIAGNOSIS — Z885 Allergy status to narcotic agent status: Secondary | ICD-10-CM | POA: Diagnosis not present

## 2019-10-19 DIAGNOSIS — Z79899 Other long term (current) drug therapy: Secondary | ICD-10-CM | POA: Diagnosis not present

## 2019-10-19 DIAGNOSIS — K224 Dyskinesia of esophagus: Secondary | ICD-10-CM | POA: Diagnosis present

## 2019-10-19 DIAGNOSIS — K219 Gastro-esophageal reflux disease without esophagitis: Secondary | ICD-10-CM | POA: Diagnosis present

## 2019-10-19 DIAGNOSIS — Z79890 Hormone replacement therapy (postmenopausal): Secondary | ICD-10-CM | POA: Diagnosis not present

## 2019-10-19 DIAGNOSIS — E034 Atrophy of thyroid (acquired): Secondary | ICD-10-CM | POA: Diagnosis not present

## 2019-10-19 DIAGNOSIS — I10 Essential (primary) hypertension: Secondary | ICD-10-CM | POA: Diagnosis present

## 2019-10-19 DIAGNOSIS — Z86010 Personal history of colonic polyps: Secondary | ICD-10-CM | POA: Diagnosis not present

## 2019-10-19 HISTORY — PX: ESOPHAGOGASTRODUODENOSCOPY (EGD) WITH PROPOFOL: SHX5813

## 2019-10-19 LAB — COMPREHENSIVE METABOLIC PANEL
ALT: 16 U/L (ref 0–44)
AST: 21 U/L (ref 15–41)
Albumin: 4.1 g/dL (ref 3.5–5.0)
Alkaline Phosphatase: 98 U/L (ref 38–126)
Anion gap: 13 (ref 5–15)
BUN: 10 mg/dL (ref 8–23)
CO2: 24 mmol/L (ref 22–32)
Calcium: 9.7 mg/dL (ref 8.9–10.3)
Chloride: 96 mmol/L — ABNORMAL LOW (ref 98–111)
Creatinine, Ser: 1.53 mg/dL — ABNORMAL HIGH (ref 0.44–1.00)
GFR calc Af Amer: 38 mL/min — ABNORMAL LOW (ref >60)
GFR calc non Af Amer: 33 mL/min — ABNORMAL LOW (ref >60)
Glucose, Bld: 108 mg/dL — ABNORMAL HIGH (ref 70–99)
Potassium: 3.5 mmol/L (ref 3.5–5.1)
Sodium: 133 mmol/L — ABNORMAL LOW (ref 135–145)
Total Bilirubin: 1.1 mg/dL (ref 0.3–1.2)
Total Protein: 6.9 g/dL (ref 6.5–8.1)

## 2019-10-19 LAB — BASIC METABOLIC PANEL
Anion gap: 11 (ref 5–15)
BUN: 13 mg/dL (ref 8–23)
CO2: 23 mmol/L (ref 22–32)
Calcium: 8.5 mg/dL — ABNORMAL LOW (ref 8.9–10.3)
Chloride: 101 mmol/L (ref 98–111)
Creatinine, Ser: 2.16 mg/dL — ABNORMAL HIGH (ref 0.44–1.00)
GFR calc Af Amer: 25 mL/min — ABNORMAL LOW (ref >60)
GFR calc non Af Amer: 22 mL/min — ABNORMAL LOW (ref >60)
Glucose, Bld: 96 mg/dL (ref 70–99)
Potassium: 3.6 mmol/L (ref 3.5–5.1)
Sodium: 135 mmol/L (ref 135–145)

## 2019-10-19 LAB — CBC WITH DIFFERENTIAL/PLATELET
Abs Immature Granulocytes: 0.04 10*3/uL (ref 0.00–0.07)
Basophils Absolute: 0.1 10*3/uL (ref 0.0–0.1)
Basophils Relative: 1 %
Eosinophils Absolute: 0 10*3/uL (ref 0.0–0.5)
Eosinophils Relative: 0 %
HCT: 43.2 % (ref 36.0–46.0)
Hemoglobin: 15 g/dL (ref 12.0–15.0)
Immature Granulocytes: 0 %
Lymphocytes Relative: 11 %
Lymphs Abs: 1 10*3/uL (ref 0.7–4.0)
MCH: 29.7 pg (ref 26.0–34.0)
MCHC: 34.7 g/dL (ref 30.0–36.0)
MCV: 85.5 fL (ref 80.0–100.0)
Monocytes Absolute: 1.3 10*3/uL — ABNORMAL HIGH (ref 0.1–1.0)
Monocytes Relative: 14 %
Neutro Abs: 6.8 10*3/uL (ref 1.7–7.7)
Neutrophils Relative %: 74 %
Platelets: 347 10*3/uL (ref 150–400)
RBC: 5.05 MIL/uL (ref 3.87–5.11)
RDW: 12.5 % (ref 11.5–15.5)
WBC: 9.3 10*3/uL (ref 4.0–10.5)
nRBC: 0 % (ref 0.0–0.2)

## 2019-10-19 LAB — SARS CORONAVIRUS 2 (TAT 6-24 HRS): SARS Coronavirus 2: NEGATIVE

## 2019-10-19 SURGERY — ESOPHAGOGASTRODUODENOSCOPY (EGD) WITH PROPOFOL
Anesthesia: Monitor Anesthesia Care

## 2019-10-19 MED ORDER — PROPOFOL 500 MG/50ML IV EMUL
INTRAVENOUS | Status: DC | PRN
  Administered 2019-10-19: 100 ug/kg/min via INTRAVENOUS

## 2019-10-19 MED ORDER — ACETAMINOPHEN 325 MG PO TABS: 650.0000 mg | ORAL_TABLET | Freq: Four times a day (QID) | ORAL | Status: DC | PRN

## 2019-10-19 MED ORDER — ONDANSETRON HCL 4 MG PO TABS: 4.0000 mg | ORAL_TABLET | Freq: Four times a day (QID) | ORAL | Status: DC | PRN

## 2019-10-19 MED ORDER — ONDANSETRON HCL 4 MG/2ML IJ SOLN: 4.0000 mg | Freq: Four times a day (QID) | INTRAMUSCULAR | Status: DC | PRN

## 2019-10-19 MED ORDER — ENOXAPARIN SODIUM 40 MG/0.4ML ~~LOC~~ SOLN: 40.0000 mg | SUBCUTANEOUS | Status: DC

## 2019-10-19 MED ORDER — LEVOTHYROXINE SODIUM 100 MCG/5ML IV SOLN
50.0000 ug | Freq: Every day | INTRAVENOUS | Status: DC
  Administered 2019-10-19 – 2019-10-22 (×4): 50 ug via INTRAVENOUS
  Filled 2019-10-19 (×4): qty 5

## 2019-10-19 MED ORDER — PANTOPRAZOLE SODIUM 40 MG IV SOLR
40.0000 mg | Freq: Two times a day (BID) | INTRAVENOUS | Status: DC
  Administered 2019-10-19 – 2019-10-22 (×6): 40 mg via INTRAVENOUS
  Filled 2019-10-19 (×6): qty 40

## 2019-10-19 MED ORDER — LIDOCAINE HCL (CARDIAC) PF 100 MG/5ML IV SOSY
PREFILLED_SYRINGE | INTRAVENOUS | Status: DC | PRN
  Administered 2019-10-19: 60 mg via INTRATRACHEAL

## 2019-10-19 MED ORDER — FLUCONAZOLE IN SODIUM CHLORIDE 200-0.9 MG/100ML-% IV SOLN
200.0000 mg | Freq: Once | INTRAVENOUS | Status: DC
  Filled 2019-10-19: qty 100

## 2019-10-19 MED ORDER — ONDANSETRON HCL 4 MG/2ML IJ SOLN
INTRAMUSCULAR | Status: DC | PRN
  Administered 2019-10-19: 4 mg via INTRAVENOUS

## 2019-10-19 MED ORDER — SODIUM CHLORIDE 0.9 % IV SOLN: INTRAVENOUS | Status: AC

## 2019-10-19 MED ORDER — ENOXAPARIN SODIUM 30 MG/0.3ML ~~LOC~~ SOLN: 30.0000 mg | SUBCUTANEOUS | Status: DC

## 2019-10-19 MED ORDER — PANTOPRAZOLE SODIUM 40 MG IV SOLR
40.0000 mg | INTRAVENOUS | Status: DC
  Administered 2019-10-19: 40 mg via INTRAVENOUS
  Filled 2019-10-19: qty 40

## 2019-10-19 MED ORDER — ACETAMINOPHEN 650 MG RE SUPP: 650.0000 mg | Freq: Four times a day (QID) | RECTAL | Status: DC | PRN

## 2019-10-19 MED ORDER — PROPOFOL 10 MG/ML IV BOLUS
INTRAVENOUS | Status: DC | PRN
  Administered 2019-10-19: 20 mg via INTRAVENOUS

## 2019-10-19 MED ORDER — SODIUM CHLORIDE 0.9% FLUSH
3.0000 mL | Freq: Two times a day (BID) | INTRAVENOUS | Status: DC
  Administered 2019-10-20 – 2019-10-22 (×5): 3 mL via INTRAVENOUS

## 2019-10-19 MED ORDER — FLUCONAZOLE 100MG IVPB: 100.0000 mg | INTRAVENOUS | Status: DC

## 2019-10-19 SURGICAL SUPPLY — 15 items

## 2019-10-19 NOTE — Transfer of Care (Signed)
Immediate Anesthesia Transfer of Care Note  Patient: Crystal Kidd  Procedure(s) Performed: ESOPHAGOGASTRODUODENOSCOPY (EGD) WITH PROPOFOL (N/A )  Patient Location: Endoscopy Unit  Anesthesia Type:MAC  Level of Consciousness: drowsy and patient cooperative  Airway & Oxygen Therapy: Patient Spontanous Breathing and Patient connected to nasal cannula oxygen  Post-op Assessment: Report given to RN and Post -op Vital signs reviewed and stable  Post vital signs: Reviewed and stable  Last Vitals:  Vitals Value Taken Time  BP    Temp    Pulse    Resp    SpO2      Last Pain:  Vitals:   10/19/19 1336  TempSrc: Oral  PainSc: 0-No pain         Complications: No apparent anesthesia complications

## 2019-10-19 NOTE — Progress Notes (Signed)
Received pt from the ED, alert and oriented, oriented to the room, placed on Tele and implemented all MD orders, call light in reach, all questions answered

## 2019-10-19 NOTE — Anesthesia Preprocedure Evaluation (Addendum)
Anesthesia Evaluation  Patient identified by MRN, date of birth, ID band Patient awake    Reviewed: Allergy & Precautions, NPO status , Patient's Chart, lab work & pertinent test results  History of Anesthesia Complications Negative for: history of anesthetic complications  Airway Mallampati: II  TM Distance: >3 FB Neck ROM: Full    Dental  (+) Dental Advisory Given, Partial Upper,    Pulmonary neg pulmonary ROS,    Pulmonary exam normal        Cardiovascular hypertension, Pt. on medications negative cardio ROS Normal cardiovascular exam     Neuro/Psych negative neurological ROS     GI/Hepatic Neg liver ROS, GERD  ,  Endo/Other  Hypothyroidism   Renal/GU Renal InsufficiencyRenal disease     Musculoskeletal negative musculoskeletal ROS (+)   Abdominal   Peds  Hematology negative hematology ROS (+)   Anesthesia Other Findings Day of surgery medications reviewed with the patient.  Reproductive/Obstetrics                           Anesthesia Physical Anesthesia Plan  ASA: II  Anesthesia Plan: MAC   Post-op Pain Management:    Induction:   PONV Risk Score and Plan: 2 and Ondansetron and Propofol infusion  Airway Management Planned: Natural Airway  Additional Equipment:   Intra-op Plan:   Post-operative Plan:   Informed Consent: I have reviewed the patients History and Physical, chart, labs and discussed the procedure including the risks, benefits and alternatives for the proposed anesthesia with the patient or authorized representative who has indicated his/her understanding and acceptance.     Dental advisory given  Plan Discussed with: Anesthesiologist and CRNA  Anesthesia Plan Comments:        Anesthesia Quick Evaluation

## 2019-10-19 NOTE — Progress Notes (Signed)
Progress Note    Crystal Kidd  [id number] DOB: Jan 31, 1946  DOA: 10/18/2019 PCP: [name], MD    Brief Narrative:   Chief complaint: difficulty swallowing  Medical records reviewed and are as summarized below:  Crystal Kidd is an very pleasant 74 y.o. female with a past medical history that includes hypothyroidism, hypertension, GERD and eosinophilic esophagitis admitted March 8 with worsening dysphagia unable to swallow food or pills and only small sips of liquid.  History of eosinophilic esophagitis treated with Diflucan and nystatin for suspected Candida.  Morning she is light difficulty managing her own secretions.  Evaluated by gastroenterology recommend EGD tomorrow  Assessment/Plan:   Principal Problem:   Esophageal dysphagia Active Problems:   Essential hypertension   Hyponatremia   Hypothyroidism   #1.  Dysphagia.  Patient with a history of same.  Evaluated by GI.  EGD scheduled for tomorrow.  This morning she is having slight difficulty managing secretions is using a spit jar.  Denies pain. -Gentle IV fluids -N.p.o. -PPI -Monitor electrolytes  #2.  Hyponatremia.  To decreased oral intake over the last 2 weeks.  This morning with IV fluids.  Sodium level 133 up from 126 on admission -Gentle IV fluids -Monitor closely -See #1  #3.  Acute kidney injury.  Likely related to dehydration in the setting of decreased oral input secondary to #1.  Serum creatinine 1.53 on admission.  Chart review indicates creatinine 0.78 3 weeks ago. -Continue IV fluids -Recheck basic metabolic panel this afternoon -Adjust IV fluids as indicated -Hold nephrotoxins -Monitor urine output  #4.  Hypertension.  Fair control.  Home medications include hydrochlorothiazide, losartan.  Patient unable to take pills due to #1. -Monitor closely -As needed hydralazine if needed  5.  Hypothyroidism -Continue home Synthroid via IV   Family Communication/Anticipated D/C date and  plan/Code Status   DVT prophylaxis: Lovenox ordered. Code Status: Full Code.  Family Communication: patient Disposition Plan: home when ready   Medical Consultants:    [facility].   Anti-Infectives:    None  Subjective:   Awake alert.  Complains of "sore throat" but denies any real pain.  Having mild difficulty managing her secretions but keeping airway cleared by spitting saliva into emesis basin.  No distress  Objective:    Vitals:   10/19/19 0200 10/19/19 0249 10/19/19 0253 10/19/19 0816  BP: 136/65  (!) 138/56 123/66  Pulse: 92  97 86  Resp: (!) 22  16 18   Temp:   98.2 F (36.8 C) 98.2 F (36.8 C)  TempSrc:   Oral Oral  SpO2: 95%  97% 95%  Weight:  59.1 kg    Height:  5\' 3"  (1.6 m)      Intake/Output Summary (Last 24 hours) at 10/19/2019 0920 Last data filed at 10/19/2019 0041 Gross per 24 hour  Intake 500 ml  Output --  Net 500 ml   Filed Weights   10/19/19 0249  Weight: 59.1 kg    Exam: General: Awake alert slightly pale no acute distress CV: Regular rate and rhythm no murmur gallop or rub no lower extremity edema Respiratory: No increased work of breathing breath sounds are clear bilaterally I hear no crackles no wheeze Abdomen: Nondistended soft positive bowel sounds throughout nontender to palpation no guarding or rebounding she is having slight difficulty managing her secretions but airway is clear and she is spitting in emesis basin. Musculoskeletal: Joints without swelling/erythema full range of motion Neuro: Alert and oriented  x3 speech clear facial symmetry cranial nerves II through XII grossly intact  Data Reviewed:   I have personally reviewed following labs and imaging studies:  Labs: Labs show the following:   Basic Metabolic Panel: Recent Labs  Lab 10/17/19 1709 10/18/19 2315  NA 126* 133*  K 3.7 3.5  CL 93* 96*  CO2 23 24  GLUCOSE 128* 108*  BUN 5* 10  CREATININE 0.81 1.53*  CALCIUM 9.2 9.7   GFR Estimated Creatinine  Clearance: 26.7 mL/min (A) (by C-G formula based on SCr of 1.53 mg/dL (H)). Liver Function Tests: Recent Labs  Lab 10/17/19 1709 10/18/19 2315  AST 24 21  ALT 17 16  ALKPHOS 101 98  BILITOT 0.9 1.1  PROT 6.6 6.9  ALBUMIN 3.9 4.1   No results for input(s): LIPASE, AMYLASE in the last 168 hours. No results for input(s): AMMONIA in the last 168 hours. Coagulation profile No results for input(s): INR, PROTIME in the last 168 hours.  CBC: Recent Labs  Lab 10/17/19 1709 10/18/19 2315  WBC 6.2 9.3  NEUTROABS 4.3 6.8  HGB 14.4 15.0  HCT 40.5 43.2  MCV 84.2 85.5  PLT 334 347   Cardiac Enzymes: No results for input(s): CKTOTAL, CKMB, CKMBINDEX, TROPONINI in the last 168 hours. BNP (last 3 results) No results for input(s): PROBNP in the last 8760 hours. CBG: No results for input(s): GLUCAP in the last 168 hours. D-Dimer: No results for input(s): DDIMER in the last 72 hours. Hgb A1c: No results for input(s): HGBA1C in the last 72 hours. Lipid Profile: No results for input(s): CHOL, HDL, LDLCALC, TRIG, CHOLHDL, LDLDIRECT in the last 72 hours. Thyroid function studies: No results for input(s): TSH, T4TOTAL, T3FREE, THYROIDAB in the last 72 hours.  Invalid input(s): FREET3 Anemia work up: No results for input(s): VITAMINB12, FOLATE, FERRITIN, TIBC, IRON, RETICCTPCT in the last 72 hours. Sepsis Labs: Recent Labs  Lab 10/17/19 1709 10/18/19 2315  WBC 6.2 9.3    Microbiology Recent Results (from the past 240 hour(s))  SARS CORONAVIRUS 2 (TAT 6-24 HRS) Nasopharyngeal Nasopharyngeal Swab     Status: None   Collection Time: 10/19/19 12:04 AM   Specimen: Nasopharyngeal Swab  Result Value Ref Range Status   SARS Coronavirus 2 NEGATIVE NEGATIVE Final    Comment: (NOTE) SARS-CoV-2 target nucleic acids are NOT DETECTED. The SARS-CoV-2 RNA is generally detectable in upper and lower respiratory specimens during the acute phase of infection. Negative results do not preclude  SARS-CoV-2 infection, do not rule out co-infections with other pathogens, and should not be used as the sole basis for treatment or other patient management decisions. Negative results must be combined with clinical observations, patient history, and epidemiological information. The expected result is Negative. Fact Sheet for Patients: SugarRoll.be Fact Sheet for Healthcare Providers: https://www.woods-mathews.com/ This test is not yet approved or cleared by the [place] FDA and  has been authorized for detection and/or diagnosis of SARS-CoV-2 by FDA under an Emergency Use Authorization (EUA). This EUA will remain  in effect (meaning this test can be used) for the duration of the COVID-19 declaration under Section 56 4(b)(1) of the Act, 21 U.S.C. section 360bbb-3(b)(1), unless the authorization is terminated or revoked sooner. Performed at [facility], [place] [address] [place], [place] [address]     Procedures and diagnostic studies:  DG Chest 2 View  Result Date: 10/17/2019 CLINICAL DATA:  Difficulty swallowing. EXAM: CHEST - 2 VIEW COMPARISON:  Chest x-ray 05/14/2016 FINDINGS: The cardiac silhouette,  mediastinal and hilar contours are normal and stable. There is mild tortuosity and calcification of the thoracic aorta. Mild chronic bronchitic type interstitial lung changes but no infiltrates, edema or effusions. The bony thorax is intact. IMPRESSION: No acute cardiopulmonary findings. Electronically Signed   By: Marijo Sanes M.D.   On: 10/17/2019 17:59    Medications:   . enoxaparin (LOVENOX) injection  40 mg Subcutaneous Q24H  . levothyroxine  50 mcg Intravenous Daily  . pantoprazole (PROTONIX) IV  40 mg Intravenous Q24H  . sodium chloride flush  3 mL Intravenous Q12H   Continuous Infusions: . sodium chloride 125 mL/hr at 10/19/19 0757     LOS: 0 days   Radene Gunning NP [facility]   How to contact the [facility]  Attending or Consulting provider Echo or covering provider during after hours [place], for this patient?  1. Check the care team in [facility] and look for a) attending/consulting TR[facility] provider listed and b) the [facility] team listed 2. Log into [email or web address] and use [facility]'s universal password to access. If you do not have the password, please contact the hospital operator. 3. Locate the [facility] provider you are looking for under [facility] and page to a number that you can be directly reached. 4. If you still have difficulty reaching the provider, please page the [facility] (Director on Call) for the Hospitalists listed on amion for assistance.  10/19/2019, 9:20 AM

## 2019-10-19 NOTE — H&P (Signed)
History and Physical    Crystal Kidd [id number] DOB: 1945/10/13 DOA: 10/18/2019  PCP: [name], MD   Patient coming from: Home   Chief Complaint: Difficulty swallowing   HPI: Crystal Kidd is a 74 y.o. female with medical history significant for hypothyroidism, hypertension, GERD, and eosinophilic esophagitis, now presenting to the emergency department with difficulty swallowing.  Patient had EGD performed in December 2020 for dysphagia, had dilation performed, biopsies were taken and consistent with eosinophilic esophagitis, and she had been taking PPI and fluticasone for this.  She has had recurrence and dysphagia and sensation of fullness in her throat that has worsened over a few weeks despite treatment with Diflucan and then nystatin for presumed Candida.  She has been taking liquids only for the past couple days but has even had difficulty with liquids today.  She was able to take her thyroid pill yesterday given the small size, but has been unable to take her other medications or eat any food.  She only had a couple sips of Gatorade in the past day or so.  She denies any fevers, chills, cough, chest pain, vomiting, or drooling.  ED Course: Upon arrival to the ED, patient is found to be afebrile, saturating well on room air, tachycardic in the 110s, and with stable blood pressure.  CBC is unremarkable and chemistry panel not yet resulted.  She was started on IV fluids in the ED, gastroenterology was consulted by the ED physician, and hospitalist consulted for admission.  Review of Systems:  All other systems reviewed and apart from HPI, are negative.  Past Medical History:  Diagnosis Date  . Allergic rhinoconjunctivitis   . Allergy 1992  . Arthritis   . Cataract   . Diverticulosis   . Eosinophilic esophagitis   . GERD (gastroesophageal reflux disease)    PMH of esophageal stricture  . Hyperlipidemia   . Hypertension   . Osteopenia    BMD @[facility]   . Thyroid disease    hypothyroidism  . Transfusion history 1968   post partum  . Trapezius muscle spasm 04/13/2015  . Vitiligo     Past Surgical History:  Procedure Laterality Date  . ABDOMINAL HYSTERECTOMY     for dysfunctional menses; USO with TAH  . APPENDECTOMY    . BREAST EXCISIONAL BIOPSY Right 1972   NEG  . BREAST EXCISIONAL BIOPSY Left 1980'S   NEG  . BREAST SURGERY     biopsy X 2  . CHOLECYSTECTOMY N/A 04/30/2016   Procedure: LAPAROSCOPIC CHOLECYSTECTOMY;  Surgeon: Georganna Skeans, MD;  Location: [facility];  Service: General;  Laterality: N/A;  . COLONOSCOPY     X2; Tics; Dr Fuller Plan, GI  . ESOPHAGEAL DILATION     X2  . EYE SURGERY    . FRACTURE SURGERY  2003or 2004  . ROTATOR CUFF REPAIR     R shoulder  . TUBAL LIGATION     befor hysterectomy  . UPPER GASTROINTESTINAL ENDOSCOPY       reports that she has never smoked. She has never used smokeless tobacco. She reports current alcohol use. She reports that she does not use drugs.  Allergies  Allergen Reactions  . Besivance [Besifloxacin Hcl] Shortness Of Breath and Other (See Comments)    Dizziness  . Alendronate Sodium Other (See Comments)    REACTION: intolerant ( PMH of esophageal stricture)  . Dilaudid [Hydromorphone Hcl] Itching    Nasal itching; resolution with Benadryl  . Codeine Nausea And Vomiting and Other (See  Comments)    hallucinations  . Flonase [Fluticasone]     Mouth blisters  . Ibandronate Sodium Other (See Comments)     bone pain    Family History  Problem Relation Age of Onset  . Breast cancer Sister        mammograms @ [facility]  . Colon polyps Sister   . COPD Father   . Cancer Mother        breast&stomach  . Stomach cancer Mother   . Breast cancer Mother   . Colon cancer Maternal Aunt   . Cancer Maternal Aunt 80       colon cA  . Coronary artery disease Brother   . COPD Brother   . Colon polyps Brother   . COPD Son   . Diabetes Neg Hx   . Stroke Neg Hx   . Heart disease Neg Hx   . Esophageal cancer Neg  Hx   . Rectal cancer Neg Hx      Prior to Admission medications   Medication Sig Start Date End Date Taking? Authorizing Provider  fluticasone (FLOVENT HFA) 220 MCG/ACT inhaler Swallow ( not inhale) two puffs twice a day Patient taking differently: Inhale 2 puffs into the lungs in the morning and at bedtime.  10/01/19  Yes Paulette Blanch, MD  gabapentin (NEURONTIN) 100 MG capsule TAKE 1 CAPSULE (100 MG TOTAL) BY MOUTH 3 (THREE) TIMES DAILY. EVERY 8 HOURS AS NEEDED Patient taking differently: Take 100 mg by mouth 3 (three) times daily.  04/22/19  Yes [name], MD  hydrochlorothiazide (HYDRODIURIL) 25 MG tablet TAKE 1/2 TABLET DAILY AS NEEDED Patient taking differently: Take 25 mg by mouth daily.  01/13/17  Yes [name], MD  levothyroxine (SYNTHROID) 75 MCG tablet TAKE 1 TABLET (75 MCG TOTAL) BY MOUTH DAILY BEFORE BREAKFAST. 08/11/19  Yes [name], MD  losartan (COZAAR) 100 MG tablet TAKE 1 TABLET EVERY DAY Patient taking differently: Take 100 mg by mouth daily.  07/21/19  Yes [name], MD  nystatin (MYCOSTATIN) 100000 UNIT/ML suspension Take 5 mLs (500,000 Units total) by mouth 3 (three) times daily. SWISH AND SWALLOW THREE TIMES A DAY 10/11/19  Yes Zehr, Laban Emperor, PA-C  omeprazole (PRILOSEC) 40 MG capsule TAKE 1 CAPSULE EVERY DAY Patient taking differently: Take 40 mg by mouth daily.  06/28/19  Yes [name], MD  ondansetron (ZOFRAN ODT) 4 MG disintegrating tablet Take 1 tablet (4 mg total) by mouth every 8 (eight) hours as needed for nausea or vomiting. 07/19/19  Yes [name], MD  Polyethyl Glycol-Propyl Glycol (SYSTANE OP) Place 1 drop into both eyes 4 (four) times daily as needed (dry eyes).   Yes [provider]  polyethylene glycol (MIRALAX / GLYCOLAX) 17 g packet Take 17 g by mouth daily.   Yes [provider]  simvastatin (ZOCOR) 20 MG tablet TAKE 1 TABLET EVERY EVENING Patient taking differently: Take 20 mg by mouth daily at 6 PM.   05/10/19  Yes [name], MD  benzonatate (TESSALON) 100 MG capsule TAKE 2 CAPSULES (200 MG TOTAL) BY MOUTH 3 (THREE) TIMES DAILY AS NEEDED FOR COUGH. Patient not taking: Reported on 10/19/2019 11/04/17   [name], MD  fluconazole (DIFLUCAN) 100 MG tablet Take 2 tablets (200 mg total) by mouth daily for 1 day, THEN 1 tablet (100 mg total) daily for 6 days. Patient not taking: Reported on 10/19/2019 10/15/19 10/22/19  Zehr, Janett Billow D, PA-C  fluticasone Asencion Islam)  50 MCG/ACT nasal spray Place 2 sprays into both nostrils daily. Patient not taking: Reported on 10/19/2019 07/31/18   [name], MD  magic mouthwash SOLN Take 5 mLs by mouth 3 (three) times daily as needed for mouth pain. Patient not taking: Reported on 10/19/2019 10/01/19   Paulette Blanch, MD  metoCLOPramide (REGLAN) 10 MG tablet Take one tablet by mouth by mouth one hour prior to each prep dose. Patient not taking: Reported on 10/19/2019 09/16/19   Ladene Artist, MD    Physical Exam: Vitals:   10/18/19 1950 10/18/19 2230 10/18/19 2300 10/18/19 2330  BP: 109/79 106/69 117/65 138/66  Pulse: (!) 108 94 87 97  Resp: 18 17 (!) 27 (!) 22  Temp:      TempSrc:      SpO2: 96% 96% 95% 94%     Constitutional: NAD, calm  Eyes: PERTLA, lids and conjunctivae normal ENMT: Mucous membranes are moist. Posterior pharynx clear of any exudate or lesions.   Neck: normal, supple, no masses, no thyromegaly Respiratory: clear to auscultation bilaterally, no wheezing, no crackles. No accessory muscle use.  Cardiovascular: S1 & S2 heard, regular rate and rhythm. No extremity edema.   Abdomen: No distension, no tenderness, soft. Bowel sounds active.  Musculoskeletal: no clubbing / cyanosis. No joint deformity upper and lower extremities.   Skin: no significant rashes, lesions, ulcers. Warm, dry, well-perfused. Neurologic: no facial asymmetry. Sensation intact. Moving all extremities.  Psychiatric: Alert and oriented x 3. Pleasant and cooperative.      Labs and Imaging on Admission: I have personally reviewed following labs and imaging studies  CBC: Recent Labs  Lab 10/17/19 1709 10/18/19 2315  WBC 6.2 9.3  NEUTROABS 4.3 6.8  HGB 14.4 15.0  HCT 40.5 43.2  MCV 84.2 85.5  PLT 334 [id number]   Basic Metabolic Panel: Recent Labs  Lab 10/17/19 1709  NA 126*  K 3.7  CL 93*  CO2 23  GLUCOSE 128*  BUN 5*  CREATININE 0.81  CALCIUM 9.2   GFR: Estimated Creatinine Clearance: 50.4 mL/min (by C-G formula based on SCr of 0.81 mg/dL). Liver Function Tests: Recent Labs  Lab 10/17/19 1709  AST 24  ALT 17  ALKPHOS 101  BILITOT 0.9  PROT 6.6  ALBUMIN 3.9   No results for input(s): LIPASE, AMYLASE in the last 168 hours. No results for input(s): AMMONIA in the last 168 hours. Coagulation Profile: No results for input(s): INR, PROTIME in the last 168 hours. Cardiac Enzymes: No results for input(s): CKTOTAL, CKMB, CKMBINDEX, TROPONINI in the last 168 hours. BNP (last 3 results) No results for input(s): PROBNP in the last 8760 hours. HbA1C: No results for input(s): HGBA1C in the last 72 hours. CBG: No results for input(s): GLUCAP in the last 168 hours. Lipid Profile: No results for input(s): CHOL, HDL, LDLCALC, TRIG, CHOLHDL, LDLDIRECT in the last 72 hours. Thyroid Function Tests: No results for input(s): TSH, T4TOTAL, FREET4, T3FREE, THYROIDAB in the last 72 hours. Anemia Panel: No results for input(s): VITAMINB12, FOLATE, FERRITIN, TIBC, IRON, RETICCTPCT in the last 72 hours. Urine analysis:    Component Value Date/Time   COLORURINE YELLOW 05/04/2016 2113   APPEARANCEUR CLEAR 05/04/2016 2113   LABSPEC 1.010 05/04/2016 2113   PHURINE 8.0 05/04/2016 2113   GLUCOSEU NEGATIVE 05/04/2016 2113   GLUCOSEU NEGATIVE 01/02/2016 0956   HGBUR NEGATIVE 05/04/2016 2113   BILIRUBINUR NEGATIVE 05/04/2016 2113   BILIRUBINUR negative 04/09/2016 [place] 05/04/2016 2113   PROTEINUR  NEGATIVE 05/04/2016 2113    UROBILINOGEN 0.2 04/09/2016 1150   UROBILINOGEN 0.2 01/02/2016 0956   NITRITE NEGATIVE 05/04/2016 2113   LEUKOCYTESUR TRACE (A) 05/04/2016 2113   Sepsis Labs: @LABRCNTIP (procalcitonin:4,lacticidven:4) )No results found for this or any previous visit (from the past 240 hour(s)).   Radiological Exams on Admission: DG Chest 2 View  Result Date: 10/17/2019 CLINICAL DATA:  Difficulty swallowing. EXAM: CHEST - 2 VIEW COMPARISON:  Chest x-ray 05/14/2016 FINDINGS: The cardiac silhouette, mediastinal and hilar contours are normal and stable. There is mild tortuosity and calcification of the thoracic aorta. Mild chronic bronchitic type interstitial lung changes but no infiltrates, edema or effusions. The bony thorax is intact. IMPRESSION: No acute cardiopulmonary findings. Electronically Signed   By: Marijo Sanes M.D.   On: 10/17/2019 17:59    Assessment/Plan   1. Dysphagia  - Presents with worsening dysphagia, able to handle secretions but not food or pills and only small sips of liquids  - She has hx of eosinophilic esophagitis and more recently treated with diflucan and nystatin for suspected candida  - GI consulted by ED physician and much appreciated  - Continue IVF hydration, keep NPO, continue acid suppression, monitor electrolytes and fluid-status    2. AKI  - Serum creatinine is 1.53 in ED, up from 0.81 the day prior   - Likely prerenal azotemia in setting of dysphagia/intolerance of oral intake  - Continue NS infusion, renally-dose medications, avoid nephrotoxins, repeat chem panel in am   3. Hypothyroid  - Continue Synthroid    4. Hypertension  - BP at goal, currently unable to swallow pills, will treat with IV agent if needed     DVT prophylaxis: Lovenox Code Status: Full  Family Communication: Discussed with patient  Disposition Plan: Likely back home pending ability to tolerate oral intake and clearance from GI  Consults called: GI consulted by ED physician  Admission  status: Observation     Vianne Bulls, MD [facility] Pager: See [email or web address]  If 7AM-7PM, please contact the daytime attending [email or web address]  10/19/2019, 12:44 AM

## 2019-10-19 NOTE — Progress Notes (Signed)
SLP Cancellation Note  Patient Details Name: AJAE STFLEUR MRN: [id number] DOB: 1945/12/18   Cancelled treatment:       Reason Eval/Treat Not Completed: Patient at procedure or test/unavailable (leaving the floor for endoscopy). Will f/u after GI intervention as indicated.    Osie Bond., M.A. [facility] Pager [phone] Office [phone]  10/19/2019, 1:22 PM

## 2019-10-19 NOTE — Progress Notes (Signed)
Pharmacy - Fluconazole dosing  Assessment/Plan: Complains of excessive amount of white secretions in throat. Suspect she has candida esophagitis per GI.  AKI noted, SCr 0.81 > 1.53 > 2.16 CrCl ~19 ml/min  Fluconazole 200 mg IV x1 then 100 mg IV q24h Decrease Lovenox to  30 mg SQ daily Monitor renal function and adjust as needed  Thank you for involving pharmacy in this patient's care.  Renold Genta, PharmD, BCPS Clinical Pharmacist Clinical phone for 10/19/2019 until 3p is [phone] 10/19/2019 1:44 PM  **Pharmacist phone directory can be found on [email or web address] listed under [place]**

## 2019-10-19 NOTE — Interval H&P Note (Signed)
History and Physical Interval Note:  10/19/2019 1:47 PM  Crystal Kidd  has presented today for surgery, with the diagnosis of dysphagia.  The various methods of treatment have been discussed with the patient and family. After consideration of risks, benefits and other options for treatment, the patient has consented to  Procedure(s): ESOPHAGOGASTRODUODENOSCOPY (EGD) WITH PROPOFOL (N/A) as a surgical intervention.  The patient's history has been reviewed, patient examined, no change in status, stable for surgery.  I have reviewed the patient's chart and labs.  Questions were answered to the patient's satisfaction.     [name]

## 2019-10-19 NOTE — Anesthesia Procedure Notes (Signed)
Procedure Name: MAC Date/Time: 10/19/2019 2:17 PM Performed by: Kathryne Hitch, CRNA Pre-anesthesia Checklist: Patient identified, Emergency Drugs available, Suction available and Patient being monitored Patient Re-evaluated:Patient Re-evaluated prior to induction Oxygen Delivery Method: Nasal cannula Preoxygenation: Pre-oxygenation with 100% oxygen Dental Injury: Teeth and Oropharynx as per pre-operative assessment

## 2019-10-19 NOTE — Op Note (Addendum)
[facility] Patient Name: Crystal Kidd Procedure Date : 10/19/2019 MRN: [id number] Attending MD: Mauri Pole , MD Date of Birth: November 13, 1945 CSN: [id number] Age: 74 Admit Type: Inpatient Procedure:                Upper GI endoscopy Indications:              Dysphagia, Odynophagia, failure to thrive, 14 lb                            weight loss, ?food impaction Providers:                Mauri Pole, MD, Melburn Popper, Technician Referring MD:              Medicines:                Monitored Anesthesia Care Complications:            No immediate complications. Estimated Blood Loss:     Estimated blood loss: none. Procedure:                Pre-Anesthesia Assessment:                           - Prior to the procedure, a History and Physical                            was performed, and patient medications and                            allergies were reviewed. The patient's tolerance of                            previous anesthesia was also reviewed. The risks                            and benefits of the procedure and the sedation                            options and risks were discussed with the patient.                            All questions were answered, and informed consent                            was obtained. Prior Anticoagulants: The patient has                            taken no previous anticoagulant or antiplatelet                            agents. ASA Grade Assessment: III - A patient with  severe systemic disease. After reviewing the risks                            and benefits, the patient was deemed in                            satisfactory condition to undergo the procedure.                           After obtaining informed consent, the endoscope was                            passed under direct vision. Throughout the                            procedure, the  patient's blood pressure, pulse, and                            oxygen saturations were monitored continuously. The                            [id number] Olympus gastroscope was                            introduced through the mouth, and advanced to the                            second part of duodenum. The upper GI endoscopy was                            accomplished without difficulty. The patient                            tolerated the procedure well. Scope In: Scope Out: Findings:      The Z-line was regular and was found 37 cm from the incisors.      No endoscopic abnormality was evident in the esophagus to explain the       patient's complaint of dysphagia. No gross lesions. Esophagus is       slightly dilated with mild distal esophagitis LA grade B but no tight       stricture, neoplastic lesion or mucosal abnormality      Patchy moderate inflammation characterized by congestion (edema),       erythema and mucus was found in the entire examined stomach.      The examined duodenum was normal. Impression:               - Z-line regular, 37 cm from the incisors. Mild                            distal esophagitis                           - No endoscopic esophageal abnormality to explain  patient's dysphagia.                           - Gastritis.                           - Normal examined duodenum.                           - No specimens collected. Moderate Sedation:      N/A Recommendation:           - Patient has a contact number available for                            emergencies. The signs and symptoms of potential                            delayed complications were discussed with the                            patient. Return to normal activities tomorrow.                            Written discharge instructions were provided to the                            patient.                           - Resume previous diet. Advance diet  as tolerated                           - Continue present medications.                           - Protonix 40mg  BID before breakfast and dinner                           - Stop Diflucan, no evidence of candida esophagitis                           - GI will sign off, available if have any questions                           - Return to GI office at the next available                            appointment with Dr Fuller Plan or APP for follow up.                           - Follow an antireflux regimen indefinitely.                           - If persistent dysphagia symptoms consider  modified barium swallow or FEES study to exclude                            oropharyngeal dysphagia Procedure Code(s):        --- Professional ---                           [id number], Esophagogastroduodenoscopy, flexible,                            transoral; diagnostic, including collection of                            specimen(s) by brushing or washing, when performed                            (separate procedure) Diagnosis Code(s):        --- Professional ---                           R13.10, Dysphagia, unspecified                           K29.70, Gastritis, unspecified, without bleeding CPT copyright 2019 [employer]. All rights reserved. The codes documented in this report are preliminary and upon coder review may  be revised to meet current compliance requirements. Mauri Pole, MD 10/19/2019 2:43:46 PM This report has been signed electronically. Number of Addenda: 0

## 2019-10-19 NOTE — Consult Note (Addendum)
Referring Provider:  [facility]         Primary Care Physician:  [name], MD Primary Gastroenterologist:   Lucio Edward, MD           We were asked to see this patient for:    Dysphagia              ASSESSMENT /  PLAN    74 year old female with PMH significant for hypertension, hypothyroidism, GERD/esophageal stricture, eosinophilic esophagitis  # Dysphagia.  --Complains of excessive amount of white secretions in throat. Suspect she has candida esophagitis.  --Will start IV Diflucan asking Pharmacy to help with dosage given AKI --Possibly secondary to candida esophagitis related to steroid inhalers --Continue IV fluids, NPO --IV PPI --Will likely need inpatient EGD, especially if no response to anti-fungal.   # Eosinophilic esophagitis --Treated with course of steroid inhalers. Treatment incomplete due to development of mouth sores / dysphagia.  --Continue BID PPI  # Hyponatremia, Na 126 --likely from volume depletion. Improved to 133.    # AKI, Cr 1.53   HPI:    Chief Complaint: Swallowing problems  Crystal Kidd is a 74 y.o. female who underwent EGD in December for evaluation of GERD / dysphagia.  Findings included gastritis (reactive gastropathy) and benign esophageal stenosis,  status post dilation.  Esophageal biopsies compatible with eosinophilic esophagitis.  She was continued on PPI, started on Flovent 2 puffs twice daily.  Seen in follow-up to 09/16/19 with complaints of mouth sores but swallowing had improved with steroid inhaler. Flovent was decreased to 1 puff twice daily.  She was asked to complete an additional 2 weeks of treatment then discontinue due to oral side effects ( small blisters).    Patient went to [facility] ED on 2/19 with complaints of dysphagia. Steroid inhaler stopped, prescribed Magic Mouthwash.  She returned to see [place] 10/07/2019. Symptoms improved but she was almost out of mouthwash which was expensive.  We prescribed nystatin swish  and swallow 3 times daily to be taken for the next 7 to 14 days for possible candida esophagitis.  She called office this past Friday with ongoing dysphagia. We started Diflucan but she had difficulty swallowing the pills. She was unable to eat. She was scheduled for EGD to be done tomorrow. Unable to tolerate PO she came to ED yesterday.   Crystal Kidd says she has been " spitting "  Up white phlegm. The oral sores have resolved. She has no odynophagia unless food / pills get stuck in her esophagus. She feels better after being started on IV fluids. No other GI complaints.    Past Medical History:  Diagnosis Date  . Allergic rhinoconjunctivitis   . Allergy 1992  . Arthritis   . Cataract   . Diverticulosis   . Eosinophilic esophagitis   . GERD (gastroesophageal reflux disease)    PMH of esophageal stricture  . Hyperlipidemia   . Hypertension   . Osteopenia    BMD @[facility]   . Thyroid disease    hypothyroidism  . Transfusion history 1968   post partum  . Trapezius muscle spasm 04/13/2015  . Vitiligo     Past Surgical History:  Procedure Laterality Date  . ABDOMINAL HYSTERECTOMY     for dysfunctional menses; USO with TAH  . APPENDECTOMY    . BREAST EXCISIONAL BIOPSY Right 1972   NEG  . BREAST EXCISIONAL BIOPSY Left 1980'S   NEG  . BREAST SURGERY     biopsy X 2  .  CHOLECYSTECTOMY N/A 04/30/2016   Procedure: LAPAROSCOPIC CHOLECYSTECTOMY;  Surgeon: Georganna Skeans, MD;  Location: [place];  Service: General;  Laterality: N/A;  . COLONOSCOPY     X2; Tics; Dr Fuller Plan, GI  . ESOPHAGEAL DILATION     X2  . EYE SURGERY    . FRACTURE SURGERY  2003or 2004  . ROTATOR CUFF REPAIR     R shoulder  . TUBAL LIGATION     befor hysterectomy  . UPPER GASTROINTESTINAL ENDOSCOPY      Prior to Admission medications   Medication Sig Start Date End Date Taking? Authorizing Provider  fluticasone (FLOVENT HFA) 220 MCG/ACT inhaler Swallow ( not inhale) two puffs twice a day Patient taking differently: Inhale 2  puffs into the lungs in the morning and at bedtime. Swallow (not inhale) two puffs twice a day 10/01/19  Yes Sung, Gretchen Short, MD  gabapentin (NEURONTIN) 100 MG capsule TAKE 1 CAPSULE (100 MG TOTAL) BY MOUTH 3 (THREE) TIMES DAILY. EVERY 8 HOURS AS NEEDED Patient taking differently: Take 100 mg by mouth 3 (three) times daily.  04/22/19  Yes [name], MD  hydrochlorothiazide (HYDRODIURIL) 25 MG tablet TAKE 1/2 TABLET DAILY AS NEEDED Patient taking differently: Take 25 mg by mouth daily.  01/13/17  Yes [name], MD  levothyroxine (SYNTHROID) 75 MCG tablet TAKE 1 TABLET (75 MCG TOTAL) BY MOUTH DAILY BEFORE BREAKFAST. 08/11/19  Yes [name], MD  losartan (COZAAR) 100 MG tablet TAKE 1 TABLET EVERY DAY Patient taking differently: Take 100 mg by mouth daily.  07/21/19  Yes [name], MD  nystatin (MYCOSTATIN) 100000 UNIT/ML suspension Take 5 mLs (500,000 Units total) by mouth 3 (three) times daily. SWISH AND SWALLOW THREE TIMES A DAY 10/11/19  Yes Zehr, Laban Emperor, PA-C  omeprazole (PRILOSEC) 40 MG capsule TAKE 1 CAPSULE EVERY DAY Patient taking differently: Take 40 mg by mouth daily.  06/28/19  Yes [name], MD  ondansetron (ZOFRAN ODT) 4 MG disintegrating tablet Take 1 tablet (4 mg total) by mouth every 8 (eight) hours as needed for nausea or vomiting. 07/19/19  Yes [name], MD  Polyethyl Glycol-Propyl Glycol (SYSTANE OP) Place 1 drop into both eyes 4 (four) times daily as needed (dry eyes).   Yes [provider]  polyethylene glycol (MIRALAX / GLYCOLAX) 17 g packet Take 17 g by mouth daily.   Yes [provider]  simvastatin (ZOCOR) 20 MG tablet TAKE 1 TABLET EVERY EVENING Patient taking differently: Take 20 mg by mouth daily at 6 PM.  05/10/19  Yes [name], MD  benzonatate (TESSALON) 100 MG capsule TAKE 2 CAPSULES (200 MG TOTAL) BY MOUTH 3 (THREE) TIMES DAILY AS NEEDED FOR COUGH. Patient not taking: Reported on 10/19/2019 11/04/17   [name], MD    fluconazole (DIFLUCAN) 100 MG tablet Take 2 tablets (200 mg total) by mouth daily for 1 day, THEN 1 tablet (100 mg total) daily for 6 days. Patient not taking: Reported on 10/19/2019 10/15/19 10/22/19  Zehr, Janett Billow D, PA-C  fluticasone (FLONASE) 50 MCG/ACT nasal spray Place 2 sprays into both nostrils daily. Patient not taking: Reported on 10/19/2019 07/31/18   [name], MD  magic mouthwash SOLN Take 5 mLs by mouth 3 (three) times daily as needed for mouth pain. Patient not taking: Reported on 10/19/2019 10/01/19   Paulette Blanch, MD  metoCLOPramide (REGLAN) 10 MG tablet Take one tablet by mouth by mouth one hour prior to each prep  dose. Patient not taking: Reported on 10/19/2019 09/16/19   Ladene Artist, MD    Current Facility-Administered Medications  Medication Dose Route Frequency Provider Last Rate Last Admin  . 0.9 %  sodium chloride infusion   Intravenous Continuous Radene Gunning, NP 75 mL/hr at 10/19/19 0957 Rate Change at 10/19/19 0957  . acetaminophen (TYLENOL) tablet 650 mg  650 mg Oral Q6H PRN Opyd, Ilene Qua, MD       Or  . acetaminophen (TYLENOL) suppository 650 mg  650 mg Rectal Q6H PRN Opyd, Ilene Qua, MD      . enoxaparin (LOVENOX) injection 40 mg  40 mg Subcutaneous Q24H Opyd, Ilene Qua, MD      . levothyroxine (SYNTHROID, LEVOTHROID) injection 50 mcg  50 mcg Intravenous Daily Opyd, Ilene Qua, MD   50 mcg at 10/19/19 1001  . ondansetron (ZOFRAN) tablet 4 mg  4 mg Oral Q6H PRN Opyd, Ilene Qua, MD       Or  . ondansetron (ZOFRAN) injection 4 mg  4 mg Intravenous Q6H PRN Opyd, Ilene Qua, MD      . pantoprazole (PROTONIX) injection 40 mg  40 mg Intravenous Q24H Opyd, Ilene Qua, MD   40 mg at 10/19/19 0041  . sodium chloride flush (NS) 0.9 % injection 3 mL  3 mL Intravenous Q12H Opyd, Ilene Qua, MD        Allergies as of 10/18/2019 - Review Complete 10/18/2019  Allergen Reaction Noted  . Besivance [besifloxacin hcl] Shortness Of Breath and Other (See Comments) 06/13/2014  .  Alendronate sodium Other (See Comments) 05/21/2010  . Dilaudid [hydromorphone hcl] Itching 04/30/2011  . Codeine Nausea And Vomiting and Other (See Comments) 07/20/2019  . Flonase [fluticasone]  10/17/2019  . Ibandronate sodium Other (See Comments) 05/21/2010    Family History  Problem Relation Age of Onset  . Breast cancer Sister        mammograms @ [facility]  . Colon polyps Sister   . COPD Father   . Cancer Mother        breast&stomach  . Stomach cancer Mother   . Breast cancer Mother   . Colon cancer Maternal Aunt   . Cancer Maternal Aunt 80       colon cA  . Coronary artery disease Brother   . COPD Brother   . Colon polyps Brother   . COPD Son   . Diabetes Neg Hx   . Stroke Neg Hx   . Heart disease Neg Hx   . Esophageal cancer Neg Hx   . Rectal cancer Neg Hx     Social History   Socioeconomic History  . Marital status: Widowed    Spouse name: Not on file  . Number of children: Not on file  . Years of education: Not on file  . Highest education level: Not on file  Occupational History  . Not on file  Tobacco Use  . Smoking status: Never Smoker  . Smokeless tobacco: Never Used  Substance and Sexual Activity  . Alcohol use: Yes    Comment: have wine 1 glass every 6 months  . Drug use: No  . Sexual activity: Not Currently  Other Topics Concern  . Not on file  Social History Narrative  . Not on file   Social Determinants of Health   Financial Resource Strain:   . Difficulty of Paying Living Expenses: Not on file  Food Insecurity:   . Worried About Charity fundraiser in the Last  Year: Not on file  . Ran Out of Food in the Last Year: Not on file  Transportation Needs:   . Lack of Transportation (Medical): Not on file  . Lack of Transportation (Non-Medical): Not on file  Physical Activity:   . Days of Exercise per Week: Not on file  . Minutes of Exercise per Session: Not on file  Stress:   . Feeling of Stress : Not on file  Social Connections:   .  Frequency of Communication with Friends and Family: Not on file  . Frequency of Social Gatherings with Friends and Family: Not on file  . Attends Religious Services: Not on file  . Active Member of Clubs or Organizations: Not on file  . Attends Archivist Meetings: Not on file  . Marital Status: Not on file  Intimate Partner Violence:   . Fear of Current or Ex-Partner: Not on file  . Emotionally Abused: Not on file  . Physically Abused: Not on file  . Sexually Abused: Not on file    Review of Systems: All systems reviewed and negative except where noted in HPI.  Physical Exam: Vital signs in last 24 hours: Temp:  [98 F (36.7 C)-98.2 F (36.8 C)] 98.2 F (36.8 C) (03/09 0816) Pulse Rate:  [86-118] 86 (03/09 0816) Resp:  [16-27] 18 (03/09 0816) BP: (106-141)/(56-79) 123/66 (03/09 0816) SpO2:  [94 %-97 %] 95 % (03/09 0816) Weight:  [59.1 kg] 59.1 kg (03/09 0249)   General:   Alert, well-developed,  female in NAD Psych:  Pleasant, cooperative. Normal mood and affect. Eyes:  Pupils equal, sclera clear, no icterus.   Conjunctiva pink. Ears:  Normal auditory acuity. Nose:  No deformity, discharge,  or lesions. Neck:  Supple; no masses Lungs:  Clear throughout to auscultation.   No wheezes, crackles, or rhonchi.  Heart:  Regular rate and rhythm;  no lower extremity edema Abdomen:  Soft, non-distended, nontender, BS active, no palp mass   Rectal:  Deferred  Msk:  Symmetrical without gross deformities. . Neurologic:  Alert and  oriented x4;  grossly normal neurologically. Skin:  Intact without significant lesions or rashes.   Intake/Output from previous day: 03/08 0701 - 03/09 0700 In: 500 [IV Piggyback:500] Out: -  Intake/Output this shift: No intake/output data recorded.  Lab Results: Recent Labs    10/17/19 1709 10/18/19 2315  WBC 6.2 9.3  HGB 14.4 15.0  HCT 40.5 43.2  PLT 334 347   BMET Recent Labs    10/17/19 1709 10/18/19 2315  NA 126* 133*  K  3.7 3.5  CL 93* 96*  CO2 23 24  GLUCOSE 128* 108*  BUN 5* 10  CREATININE 0.81 1.53*  CALCIUM 9.2 9.7   LFT Recent Labs    10/18/19 2315  PROT 6.9  ALBUMIN 4.1  AST 21  ALT 16  ALKPHOS 98  BILITOT 1.1   PT/INR No results for input(s): LABPROT, INR in the last 72 hours. Hepatitis Panel No results for input(s): HEPBSAG, HCVAB, HEPAIGM, HEPBIGM in the last 72 hours.   . CBC Latest Ref Rng & Units 10/18/2019 10/17/2019 10/01/2019  WBC 4.0 - 10.5 K/uL 9.3 6.2 6.6  Hemoglobin 12.0 - 15.0 g/dL 15.0 14.4 13.8  Hematocrit 36.0 - 46.0 % 43.2 40.5 40.8  Platelets 150 - 400 K/uL 347 334 318    . CMP Latest Ref Rng & Units 10/18/2019 10/17/2019 10/01/2019  Glucose 70 - 99 mg/dL 108(H) 128(H) 125(H)  BUN 8 - 23 mg/dL 10  5(L) 13  Creatinine 0.44 - 1.00 mg/dL 1.53(H) 0.81 0.78  Sodium 135 - 145 mmol/L 133(L) 126(L) 131(L)  Potassium 3.5 - 5.1 mmol/L 3.5 3.7 3.6  Chloride 98 - 111 mmol/L 96(L) 93(L) 92(L)  CO2 22 - 32 mmol/L 24 23 25   Calcium 8.9 - 10.3 mg/dL 9.7 9.2 8.9  Total Protein 6.5 - 8.1 g/dL 6.9 6.6 6.6  Total Bilirubin 0.3 - 1.2 mg/dL 1.1 0.9 0.7  Alkaline Phos 38 - 126 U/L 98 101 97  AST 15 - 41 U/L 21 24 20   ALT 0 - 44 U/L 16 17 15    Studies/Results: DG Chest 2 View  Result Date: 10/17/2019 CLINICAL DATA:  Difficulty swallowing. EXAM: CHEST - 2 VIEW COMPARISON:  Chest x-ray 05/14/2016 FINDINGS: The cardiac silhouette, mediastinal and hilar contours are normal and stable. There is mild tortuosity and calcification of the thoracic aorta. Mild chronic bronchitic type interstitial lung changes but no infiltrates, edema or effusions. The bony thorax is intact. IMPRESSION: No acute cardiopulmonary findings. Electronically Signed   By: Marijo Sanes M.D.   On: 10/17/2019 17:59    Principal Problem:   Esophageal dysphagia Active Problems:   Hypothyroidism   Essential hypertension   Hyponatremia    Tye Savoy, NP-C @  10/19/2019, 11:22 AM    Attending physician's note   I  have taken a history, examined the patient and reviewed the chart. I agree with the Advanced Practitioner's note, impression and recommendations.  74 year old female with history of GERD, eosinophilic esophagitis and esophageal stricture with worsening dysphagia and odynophagia.  Has oropharyngeal Candida, possible Candida esophagitis exacerbating symptoms but given patient is not even tolerating water or liquids will need to exclude food impaction Plan for EGD this afternoon to evaluate, repeat esophageal biopsies and dilation if needed based on findings The risks and benefits as well as alternatives of endoscopic procedure(s) have been discussed and reviewed. All questions answered. The patient agrees to proceed.    Damaris Hippo , MD [phone]

## 2019-10-19 NOTE — H&P (View-Only) (Signed)
Referring Provider:  [facility]         Primary Care Physician:  [name], MD Primary Gastroenterologist:   Lucio Edward, MD           We were asked to see this patient for:    Dysphagia              ASSESSMENT /  PLAN    74 year old female with PMH significant for hypertension, hypothyroidism, GERD/esophageal stricture, eosinophilic esophagitis  # Dysphagia.  --Complains of excessive amount of white secretions in throat. Suspect she has candida esophagitis.  --Will start IV Diflucan asking Pharmacy to help with dosage given AKI --Possibly secondary to candida esophagitis related to steroid inhalers --Continue IV fluids, NPO --IV PPI --Will likely need inpatient EGD, especially if no response to anti-fungal.   # Eosinophilic esophagitis --Treated with course of steroid inhalers. Treatment incomplete due to development of mouth sores / dysphagia.  --Continue BID PPI  # Hyponatremia, Na 126 --likely from volume depletion. Improved to 133.    # AKI, Cr 1.53   HPI:    Chief Complaint: Swallowing problems  Crystal Kidd is a 74 y.o. female who underwent EGD in December for evaluation of GERD / dysphagia.  Findings included gastritis (reactive gastropathy) and benign esophageal stenosis,  status post dilation.  Esophageal biopsies compatible with eosinophilic esophagitis.  She was continued on PPI, started on Flovent 2 puffs twice daily.  Seen in follow-up to 09/16/19 with complaints of mouth sores but swallowing had improved with steroid inhaler. Flovent was decreased to 1 puff twice daily.  She was asked to complete an additional 2 weeks of treatment then discontinue due to oral side effects ( small blisters).    Patient went to [facility] ED on 2/19 with complaints of dysphagia. Steroid inhaler stopped, prescribed Magic Mouthwash.  She returned to see [place] 10/07/2019. Symptoms improved but she was almost out of mouthwash which was expensive.  We prescribed nystatin swish  and swallow 3 times daily to be taken for the next 7 to 14 days for possible candida esophagitis.  She called office this past Friday with ongoing dysphagia. We started Diflucan but she had difficulty swallowing the pills. She was unable to eat. She was scheduled for EGD to be done tomorrow. Unable to tolerate PO she came to ED yesterday.   Kirbi says she has been " spitting "  Up white phlegm. The oral sores have resolved. She has no odynophagia unless food / pills get stuck in her esophagus. She feels better after being started on IV fluids. No other GI complaints.    Past Medical History:  Diagnosis Date  . Allergic rhinoconjunctivitis   . Allergy 1992  . Arthritis   . Cataract   . Diverticulosis   . Eosinophilic esophagitis   . GERD (gastroesophageal reflux disease)    PMH of esophageal stricture  . Hyperlipidemia   . Hypertension   . Osteopenia    BMD @[facility]   . Thyroid disease    hypothyroidism  . Transfusion history 1968   post partum  . Trapezius muscle spasm 04/13/2015  . Vitiligo     Past Surgical History:  Procedure Laterality Date  . ABDOMINAL HYSTERECTOMY     for dysfunctional menses; USO with TAH  . APPENDECTOMY    . BREAST EXCISIONAL BIOPSY Right 1972   NEG  . BREAST EXCISIONAL BIOPSY Left 1980'S   NEG  . BREAST SURGERY     biopsy X 2  .  CHOLECYSTECTOMY N/A 04/30/2016   Procedure: LAPAROSCOPIC CHOLECYSTECTOMY;  Surgeon: Georganna Skeans, MD;  Location: [place];  Service: General;  Laterality: N/A;  . COLONOSCOPY     X2; Tics; Dr Fuller Plan, GI  . ESOPHAGEAL DILATION     X2  . EYE SURGERY    . FRACTURE SURGERY  2003or 2004  . ROTATOR CUFF REPAIR     R shoulder  . TUBAL LIGATION     befor hysterectomy  . UPPER GASTROINTESTINAL ENDOSCOPY      Prior to Admission medications   Medication Sig Start Date End Date Taking? Authorizing Provider  fluticasone (FLOVENT HFA) 220 MCG/ACT inhaler Swallow ( not inhale) two puffs twice a day Patient taking differently: Inhale 2  puffs into the lungs in the morning and at bedtime. Swallow (not inhale) two puffs twice a day 10/01/19  Yes Sung, Gretchen Short, MD  gabapentin (NEURONTIN) 100 MG capsule TAKE 1 CAPSULE (100 MG TOTAL) BY MOUTH 3 (THREE) TIMES DAILY. EVERY 8 HOURS AS NEEDED Patient taking differently: Take 100 mg by mouth 3 (three) times daily.  04/22/19  Yes [name], MD  hydrochlorothiazide (HYDRODIURIL) 25 MG tablet TAKE 1/2 TABLET DAILY AS NEEDED Patient taking differently: Take 25 mg by mouth daily.  01/13/17  Yes [name], MD  levothyroxine (SYNTHROID) 75 MCG tablet TAKE 1 TABLET (75 MCG TOTAL) BY MOUTH DAILY BEFORE BREAKFAST. 08/11/19  Yes [name], MD  losartan (COZAAR) 100 MG tablet TAKE 1 TABLET EVERY DAY Patient taking differently: Take 100 mg by mouth daily.  07/21/19  Yes [name], MD  nystatin (MYCOSTATIN) 100000 UNIT/ML suspension Take 5 mLs (500,000 Units total) by mouth 3 (three) times daily. SWISH AND SWALLOW THREE TIMES A DAY 10/11/19  Yes Zehr, Laban Emperor, PA-C  omeprazole (PRILOSEC) 40 MG capsule TAKE 1 CAPSULE EVERY DAY Patient taking differently: Take 40 mg by mouth daily.  06/28/19  Yes [name], MD  ondansetron (ZOFRAN ODT) 4 MG disintegrating tablet Take 1 tablet (4 mg total) by mouth every 8 (eight) hours as needed for nausea or vomiting. 07/19/19  Yes [name], MD  Polyethyl Glycol-Propyl Glycol (SYSTANE OP) Place 1 drop into both eyes 4 (four) times daily as needed (dry eyes).   Yes [provider]  polyethylene glycol (MIRALAX / GLYCOLAX) 17 g packet Take 17 g by mouth daily.   Yes [provider]  simvastatin (ZOCOR) 20 MG tablet TAKE 1 TABLET EVERY EVENING Patient taking differently: Take 20 mg by mouth daily at 6 PM.  05/10/19  Yes [name], MD  benzonatate (TESSALON) 100 MG capsule TAKE 2 CAPSULES (200 MG TOTAL) BY MOUTH 3 (THREE) TIMES DAILY AS NEEDED FOR COUGH. Patient not taking: Reported on 10/19/2019 11/04/17   [name], MD    fluconazole (DIFLUCAN) 100 MG tablet Take 2 tablets (200 mg total) by mouth daily for 1 day, THEN 1 tablet (100 mg total) daily for 6 days. Patient not taking: Reported on 10/19/2019 10/15/19 10/22/19  Zehr, Janett Billow D, PA-C  fluticasone (FLONASE) 50 MCG/ACT nasal spray Place 2 sprays into both nostrils daily. Patient not taking: Reported on 10/19/2019 07/31/18   [name], MD  magic mouthwash SOLN Take 5 mLs by mouth 3 (three) times daily as needed for mouth pain. Patient not taking: Reported on 10/19/2019 10/01/19   Paulette Blanch, MD  metoCLOPramide (REGLAN) 10 MG tablet Take one tablet by mouth by mouth one hour prior to each prep  dose. Patient not taking: Reported on 10/19/2019 09/16/19   Ladene Artist, MD    Current Facility-Administered Medications  Medication Dose Route Frequency Provider Last Rate Last Admin  . 0.9 %  sodium chloride infusion   Intravenous Continuous Radene Gunning, NP 75 mL/hr at 10/19/19 0957 Rate Change at 10/19/19 0957  . acetaminophen (TYLENOL) tablet 650 mg  650 mg Oral Q6H PRN Opyd, Ilene Qua, MD       Or  . acetaminophen (TYLENOL) suppository 650 mg  650 mg Rectal Q6H PRN Opyd, Ilene Qua, MD      . enoxaparin (LOVENOX) injection 40 mg  40 mg Subcutaneous Q24H Opyd, Ilene Qua, MD      . levothyroxine (SYNTHROID, LEVOTHROID) injection 50 mcg  50 mcg Intravenous Daily Opyd, Ilene Qua, MD   50 mcg at 10/19/19 1001  . ondansetron (ZOFRAN) tablet 4 mg  4 mg Oral Q6H PRN Opyd, Ilene Qua, MD       Or  . ondansetron (ZOFRAN) injection 4 mg  4 mg Intravenous Q6H PRN Opyd, Ilene Qua, MD      . pantoprazole (PROTONIX) injection 40 mg  40 mg Intravenous Q24H Opyd, Ilene Qua, MD   40 mg at 10/19/19 0041  . sodium chloride flush (NS) 0.9 % injection 3 mL  3 mL Intravenous Q12H Opyd, Ilene Qua, MD        Allergies as of 10/18/2019 - Review Complete 10/18/2019  Allergen Reaction Noted  . Besivance [besifloxacin hcl] Shortness Of Breath and Other (See Comments) 06/13/2014  .  Alendronate sodium Other (See Comments) 05/21/2010  . Dilaudid [hydromorphone hcl] Itching 04/30/2011  . Codeine Nausea And Vomiting and Other (See Comments) 07/20/2019  . Flonase [fluticasone]  10/17/2019  . Ibandronate sodium Other (See Comments) 05/21/2010    Family History  Problem Relation Age of Onset  . Breast cancer Sister        mammograms @ [facility]  . Colon polyps Sister   . COPD Father   . Cancer Mother        breast&stomach  . Stomach cancer Mother   . Breast cancer Mother   . Colon cancer Maternal Aunt   . Cancer Maternal Aunt 80       colon cA  . Coronary artery disease Brother   . COPD Brother   . Colon polyps Brother   . COPD Son   . Diabetes Neg Hx   . Stroke Neg Hx   . Heart disease Neg Hx   . Esophageal cancer Neg Hx   . Rectal cancer Neg Hx     Social History   Socioeconomic History  . Marital status: Widowed    Spouse name: Not on file  . Number of children: Not on file  . Years of education: Not on file  . Highest education level: Not on file  Occupational History  . Not on file  Tobacco Use  . Smoking status: Never Smoker  . Smokeless tobacco: Never Used  Substance and Sexual Activity  . Alcohol use: Yes    Comment: have wine 1 glass every 6 months  . Drug use: No  . Sexual activity: Not Currently  Other Topics Concern  . Not on file  Social History Narrative  . Not on file   Social Determinants of Health   Financial Resource Strain:   . Difficulty of Paying Living Expenses: Not on file  Food Insecurity:   . Worried About Charity fundraiser in the Last  Year: Not on file  . Ran Out of Food in the Last Year: Not on file  Transportation Needs:   . Lack of Transportation (Medical): Not on file  . Lack of Transportation (Non-Medical): Not on file  Physical Activity:   . Days of Exercise per Week: Not on file  . Minutes of Exercise per Session: Not on file  Stress:   . Feeling of Stress : Not on file  Social Connections:   .  Frequency of Communication with Friends and Family: Not on file  . Frequency of Social Gatherings with Friends and Family: Not on file  . Attends Religious Services: Not on file  . Active Member of Clubs or Organizations: Not on file  . Attends Archivist Meetings: Not on file  . Marital Status: Not on file  Intimate Partner Violence:   . Fear of Current or Ex-Partner: Not on file  . Emotionally Abused: Not on file  . Physically Abused: Not on file  . Sexually Abused: Not on file    Review of Systems: All systems reviewed and negative except where noted in HPI.  Physical Exam: Vital signs in last 24 hours: Temp:  [98 F (36.7 C)-98.2 F (36.8 C)] 98.2 F (36.8 C) (03/09 0816) Pulse Rate:  [86-118] 86 (03/09 0816) Resp:  [16-27] 18 (03/09 0816) BP: (106-141)/(56-79) 123/66 (03/09 0816) SpO2:  [94 %-97 %] 95 % (03/09 0816) Weight:  [59.1 kg] 59.1 kg (03/09 0249)   General:   Alert, well-developed,  female in NAD Psych:  Pleasant, cooperative. Normal mood and affect. Eyes:  Pupils equal, sclera clear, no icterus.   Conjunctiva pink. Ears:  Normal auditory acuity. Nose:  No deformity, discharge,  or lesions. Neck:  Supple; no masses Lungs:  Clear throughout to auscultation.   No wheezes, crackles, or rhonchi.  Heart:  Regular rate and rhythm;  no lower extremity edema Abdomen:  Soft, non-distended, nontender, BS active, no palp mass   Rectal:  Deferred  Msk:  Symmetrical without gross deformities. . Neurologic:  Alert and  oriented x4;  grossly normal neurologically. Skin:  Intact without significant lesions or rashes.   Intake/Output from previous day: 03/08 0701 - 03/09 0700 In: 500 [IV Piggyback:500] Out: -  Intake/Output this shift: No intake/output data recorded.  Lab Results: Recent Labs    10/17/19 1709 10/18/19 2315  WBC 6.2 9.3  HGB 14.4 15.0  HCT 40.5 43.2  PLT 334 347   BMET Recent Labs    10/17/19 1709 10/18/19 2315  NA 126* 133*  K  3.7 3.5  CL 93* 96*  CO2 23 24  GLUCOSE 128* 108*  BUN 5* 10  CREATININE 0.81 1.53*  CALCIUM 9.2 9.7   LFT Recent Labs    10/18/19 2315  PROT 6.9  ALBUMIN 4.1  AST 21  ALT 16  ALKPHOS 98  BILITOT 1.1   PT/INR No results for input(s): LABPROT, INR in the last 72 hours. Hepatitis Panel No results for input(s): HEPBSAG, HCVAB, HEPAIGM, HEPBIGM in the last 72 hours.   . CBC Latest Ref Rng & Units 10/18/2019 10/17/2019 10/01/2019  WBC 4.0 - 10.5 K/uL 9.3 6.2 6.6  Hemoglobin 12.0 - 15.0 g/dL 15.0 14.4 13.8  Hematocrit 36.0 - 46.0 % 43.2 40.5 40.8  Platelets 150 - 400 K/uL 347 334 318    . CMP Latest Ref Rng & Units 10/18/2019 10/17/2019 10/01/2019  Glucose 70 - 99 mg/dL 108(H) 128(H) 125(H)  BUN 8 - 23 mg/dL 10  5(L) 13  Creatinine 0.44 - 1.00 mg/dL 1.53(H) 0.81 0.78  Sodium 135 - 145 mmol/L 133(L) 126(L) 131(L)  Potassium 3.5 - 5.1 mmol/L 3.5 3.7 3.6  Chloride 98 - 111 mmol/L 96(L) 93(L) 92(L)  CO2 22 - 32 mmol/L 24 23 25   Calcium 8.9 - 10.3 mg/dL 9.7 9.2 8.9  Total Protein 6.5 - 8.1 g/dL 6.9 6.6 6.6  Total Bilirubin 0.3 - 1.2 mg/dL 1.1 0.9 0.7  Alkaline Phos 38 - 126 U/L 98 101 97  AST 15 - 41 U/L 21 24 20   ALT 0 - 44 U/L 16 17 15    Studies/Results: DG Chest 2 View  Result Date: 10/17/2019 CLINICAL DATA:  Difficulty swallowing. EXAM: CHEST - 2 VIEW COMPARISON:  Chest x-ray 05/14/2016 FINDINGS: The cardiac silhouette, mediastinal and hilar contours are normal and stable. There is mild tortuosity and calcification of the thoracic aorta. Mild chronic bronchitic type interstitial lung changes but no infiltrates, edema or effusions. The bony thorax is intact. IMPRESSION: No acute cardiopulmonary findings. Electronically Signed   By: Marijo Sanes M.D.   On: 10/17/2019 17:59    Principal Problem:   Esophageal dysphagia Active Problems:   Hypothyroidism   Essential hypertension   Hyponatremia    Tye Savoy, NP-C @  10/19/2019, 11:22 AM    Attending physician's note   I  have taken a history, examined the patient and reviewed the chart. I agree with the Advanced Practitioner's note, impression and recommendations.  74 year old female with history of GERD, eosinophilic esophagitis and esophageal stricture with worsening dysphagia and odynophagia.  Has oropharyngeal Candida, possible Candida esophagitis exacerbating symptoms but given patient is not even tolerating water or liquids will need to exclude food impaction Plan for EGD this afternoon to evaluate, repeat esophageal biopsies and dilation if needed based on findings The risks and benefits as well as alternatives of endoscopic procedure(s) have been discussed and reviewed. All questions answered. The patient agrees to proceed.    Damaris Hippo , MD [phone]

## 2019-10-19 NOTE — Anesthesia Postprocedure Evaluation (Signed)
Anesthesia Post Note  Patient: Crystal Kidd  Procedure(s) Performed: ESOPHAGOGASTRODUODENOSCOPY (EGD) WITH PROPOFOL (N/A )     Patient location during evaluation: Endoscopy Anesthesia Type: MAC Level of consciousness: awake and alert Pain management: pain level controlled Vital Signs Assessment: post-procedure vital signs reviewed and stable Respiratory status: spontaneous breathing, nonlabored ventilation, respiratory function stable and patient connected to nasal cannula oxygen Cardiovascular status: blood pressure returned to baseline and stable Postop Assessment: no apparent nausea or vomiting Anesthetic complications: no    Last Vitals:  Vitals:   10/19/19 1550 10/19/19 2007  BP: (!) 142/79 131/62  Pulse: 85 84  Resp: 16 18  Temp: 36.8 C 37 C  SpO2: 97% 95%    Last Pain:  Vitals:   10/19/19 2007  TempSrc: Oral  PainSc:                  Crystal Kidd

## 2019-10-20 ENCOUNTER — Encounter: Payer: Self-pay | Admitting: *Deleted

## 2019-10-20 ENCOUNTER — Encounter: Payer: Medicare HMO | Admitting: Gastroenterology

## 2019-10-20 ENCOUNTER — Inpatient Hospital Stay (HOSPITAL_COMMUNITY): Payer: Medicare HMO

## 2019-10-20 LAB — BASIC METABOLIC PANEL
Anion gap: 10 (ref 5–15)
BUN: 15 mg/dL (ref 8–23)
CO2: 21 mmol/L — ABNORMAL LOW (ref 22–32)
Calcium: 8.5 mg/dL — ABNORMAL LOW (ref 8.9–10.3)
Chloride: 110 mmol/L (ref 98–111)
Creatinine, Ser: 1.73 mg/dL — ABNORMAL HIGH (ref 0.44–1.00)
GFR calc Af Amer: 33 mL/min — ABNORMAL LOW (ref >60)
GFR calc non Af Amer: 29 mL/min — ABNORMAL LOW (ref >60)
Glucose, Bld: 59 mg/dL — ABNORMAL LOW (ref 70–99)
Potassium: 3.2 mmol/L — ABNORMAL LOW (ref 3.5–5.1)
Sodium: 141 mmol/L (ref 135–145)

## 2019-10-20 MED ORDER — POTASSIUM CHLORIDE CRYS ER 20 MEQ PO TBCR
40.0000 meq | EXTENDED_RELEASE_TABLET | Freq: Once | ORAL | Status: AC
  Administered 2019-10-20: 40 meq via ORAL
  Filled 2019-10-20: qty 2

## 2019-10-20 MED ORDER — POLYVINYL ALCOHOL 1.4 % OP SOLN
1.0000 [drp] | OPHTHALMIC | Status: DC | PRN
  Administered 2019-10-20 (×2): 1 [drp] via OPHTHALMIC
  Filled 2019-10-20: qty 15

## 2019-10-20 NOTE — Progress Notes (Signed)
Modified Barium Swallow Progress Note  Patient Details  Name: Crystal Kidd MRN: [id number] Date of Birth: 07-24-1946  Today's Date: 10/20/2019  Modified Barium Swallow completed.  Full report located under Chart Review in the Imaging Section.  Brief recommendations include the following:  Clinical Impression  Patient presents with oropharyngeal swallow that is [facility]. During the study the pt complained of "something stuck in her throat," but her pharynx remained clear. Following bites of regular texture solids, pt was noted to have trace-mild esophageal backflow into the pharynx, but reduced as the pt utilized a liquid wash or took multiple swallows. As observed in the bedside swallow evaluation, pt also had significant belching following all trials. As far as an oropharyngeal standpoint, recommend thin liquids and up to regular texture solids, ensuring the pt sits upright >30 minutes following meals, but do recommend proceeding with esophagram. Will defer solid advancement to GI/MD.    Swallow Evaluation Recommendations   Recommended Consults: Consider GI evaluation;Consider esophageal assessment   SLP Diet Recommendations: Thin liquid;Regular solids   Liquid Administration via: Cup;Spoon;Straw   Medication Administration: Crushed with puree   Supervision: Patient able to self feed   Compensations: Slow rate;Small sips/bites   Postural Changes: Remain semi-upright after after feeds/meals (Comment);Seated upright at 90 degrees   Oral Care Recommendations: Patient independent with oral care      Aline August, Student SLP Office: [phone]  10/20/2019,2:03 PM

## 2019-10-20 NOTE — Progress Notes (Signed)
I concur with the assessment (8:00 a.m. - 5":00 p.m.)  implemented and entered by [employer], SN.

## 2019-10-20 NOTE — Progress Notes (Addendum)
          Daily Rounding Note  10/20/2019, 8:21 AM  LOS: 1 day   SUBJECTIVE:   Chief complaint: Dysphagia.     Dysphagia at level of upper esoph sphincter continues.  BSS eval by SLP this [id number] phase impairment, no aspiration.     OBJECTIVE:         Vital signs in last 24 hours:    Temp:  [98 F (36.7 C)-98.6 F (37 C)] 98.4 F (36.9 C) (03/10 0750) Pulse Rate:  [71-88] 79 (03/10 0750) Resp:  [14-31] 18 (03/10 0750) BP: (86-142)/(38-79) 112/57 (03/10 0750) SpO2:  [93 %-98 %] 98 % (03/10 0750) Weight:  [61.2 kg] 61.2 kg (03/10 0500)   Filed Weights   10/19/19 0249 10/20/19 0500  Weight: 59.1 kg 61.2 kg   General:    Heart: RRR Chest: clear, no labored breathing Abdomen: soft, NT, ND.  Active BS  Extremities: no CCE Neuro/Psych:  Oriented x 3, no tremors, weakness, deficits.    Intake/Output from previous day: 03/09 0701 - 03/10 0700 In: 187.5 [I.V.:187.5] Out: -   Intake/Output this shift: No intake/output data recorded.  Lab Results: Recent Labs    10/17/19 1709 10/18/19 2315  WBC 6.2 9.3  HGB 14.4 15.0  HCT 40.5 43.2  PLT 334 347   BMET Recent Labs    10/18/19 2315 10/19/19 1135 10/20/19 0404  NA 133* 135 141  K 3.5 3.6 3.2*  CL 96* 101 110  CO2 24 23 21*  GLUCOSE 108* 96 59*  BUN 10 13 15   CREATININE 1.53* 2.16* 1.73*  CALCIUM 9.7 8.5* 8.5*   LFT Recent Labs    10/17/19 1709 10/18/19 2315  PROT 6.6 6.9  ALBUMIN 3.9 4.1  AST 24 21  ALT 17 16  ALKPHOS 101 98  BILITOT 0.9 1.1   Scheduled Meds: . levothyroxine  50 mcg Intravenous Daily  . pantoprazole (PROTONIX) IV  40 mg Intravenous Q12H  . sodium chloride flush  3 mL Intravenous Q12H   Continuous Infusions: PRN Meds:.acetaminophen **OR** acetaminophen, ondansetron **OR** ondansetron (ZOFRAN) IV, polyvinyl alcohol   ASSESMENT:   *   Dysphagia, FTT, weight loss.  Eosinophilic esophagitis on pathology and underwent dilation  of esophageal stenosis at EGD 07/20/2019.   Treated with Flovent at reduced dose due to developing mouth sores.  10/01/2019 recurrent dysphagia treated with nystatin swish/swallow for possible Candida esophagitis.  Dysphagia persisted. 10/19/2019 EGD: Mild distal esophagitis.  No esophageal stricture.  Gastritis.  No explanation for swallowing problems.  *     Hypokalemia.  *     AKI, creatinine improved.   PLAN   *   Protonix 40 mg bid.  Currently delivered IV.  *   Barium esophagram hopefully coordinated w MBSS today.  Spoke w SLP.        Azucena Freed  10/20/2019, 8:21 AM    Attending physician's note   I have taken an interval history, reviewed the chart and examined the patient. I agree with the Advanced Practitioner's note, impression and recommendations.   No significant abnormality on EGD other than mild distal esophagitis, evaluation by speech pathologist or the barium swallow to explain severe dysphagia. Unclear etiology ??  Functional Consider psychiatric evaluation to exclude psychosomatic disorder GI will sign off, available if have any questions  Damaris Hippo , MD [phone]    Phone [phone]

## 2019-10-20 NOTE — Evaluation (Addendum)
Clinical/Bedside Swallow Evaluation Patient Details  Name: Crystal Kidd MRN: [id number] Date of Birth: Aug 16, 1945  Today's Date: 10/20/2019 Time: SLP Start Time (ACUTE ONLY): 0850 SLP Stop Time (ACUTE ONLY): 0915 SLP Time Calculation (min) (ACUTE ONLY): 25 min  Past Medical History:  Past Medical History:  Diagnosis Date  . Allergic rhinoconjunctivitis   . Allergy 1992  . Arthritis   . Cataract   . Diverticulosis   . Eosinophilic esophagitis   . GERD (gastroesophageal reflux disease)    PMH of esophageal stricture  . Hyperlipidemia   . Hypertension   . Osteopenia    BMD @[facility]   . Thyroid disease    hypothyroidism  . Transfusion history 1968   post partum  . Trapezius muscle spasm 04/13/2015  . Vitiligo    Past Surgical History:  Past Surgical History:  Procedure Laterality Date  . ABDOMINAL HYSTERECTOMY     for dysfunctional menses; USO with TAH  . APPENDECTOMY    . BREAST EXCISIONAL BIOPSY Right 1972   NEG  . BREAST EXCISIONAL BIOPSY Left 1980'S   NEG  . BREAST SURGERY     biopsy X 2  . CHOLECYSTECTOMY N/A 04/30/2016   Procedure: LAPAROSCOPIC CHOLECYSTECTOMY;  Surgeon: Georganna Skeans, MD;  Location: [facility];  Service: General;  Laterality: N/A;  . COLONOSCOPY     X2; Tics; Dr Fuller Plan, GI  . ESOPHAGEAL DILATION     X2  . EYE SURGERY    . FRACTURE SURGERY  2003or 2004  . ROTATOR CUFF REPAIR     R shoulder  . TUBAL LIGATION     befor hysterectomy  . UPPER GASTROINTESTINAL ENDOSCOPY     HPI:  Pt is a 74 yo female presenting after recent dx of EOE with worsening dysphagia. At the time of admission, pt reported only being able to swallow small sips of liquids. GI and SLP were both consulted for swallowing. PMH includes: hypothyroidism, HTN, HLD, GERD/esophageal stricture. EGD (3/9) revealing gastritis, but no significant difficulties.   Assessment / Plan / Recommendation Clinical Impression   Pt alert and cooperative during session. Pt provided a thorough hx,  complaining that she has difficulty swallowing, specifically, a suspected globus sensation and belching which worsens t/o the day. This has progressively gotten worse within the past 2 weeks. Pt was observed with thin liquids, purees and regular texture solids. Immediately following all consistencies, pt was noted to have multiple swallows, significant belching and suspected globus sensation, worsening with progression.  Pt trialed warm water vs iced water, with pt describing mild improvement, but no significant change in clinical presentation. No s/sx of aspiration noted. Considering GI results from yesterday (3/9) and the difficulties the pt is experiencing, recommend instrumental testing to further assess swallow function, which may be completed as early as this afternoon. May also want to consider esophagram. Pt may have small sips of warm water and meds crushed in puree prior to study, ensuring she is sitting upright >30 minutes following.  SLP Visit Diagnosis: Dysphagia, unspecified (R13.10)    Aspiration Risk  Mild aspiration risk    Diet Recommendation Thin liquid   Liquid Administration via: Straw;Cup;Spoon Medication Administration: Crushed with puree Supervision: Patient able to self feed Compensations: Slow rate;Small sips/bites Postural Changes: Seated upright at 90 degrees    Other  Recommendations Recommended Consults: Consider esophageal assessment Oral Care Recommendations: Patient independent with oral care   Follow up Recommendations Other (comment)(tba)      Frequency and Duration min 2x/week  2 weeks  Prognosis Prognosis for Safe Diet Advancement: Good      Swallow Study   General Date of Onset: 10/18/19 HPI: Pt is a 74 yo female presenting after recent dx of EOE with worsening dysphagia. At the time of admission, pt reported only being able to swallow small sips of liquids. GI and SLP were both consulted for swallowing. PMH includes: hypothyroidism, HTN,  HLD, GERD/esophageal stricture Type of Study: Bedside Swallow Evaluation Previous Swallow Assessment: none in chart Diet Prior to this Study: NPO Temperature Spikes Noted: No Respiratory Status: Room air History of Recent Intubation: No Behavior/Cognition: Alert;Cooperative Oral Cavity Assessment: Within Functional Limits Oral Care Completed by SLP: No Oral Cavity - Dentition: Adequate natural dentition;Dentures, not available Vision: Functional for self-feeding Self-Feeding Abilities: Able to feed self Patient Positioning: Upright in bed Baseline Vocal Quality: Normal Volitional Swallow: Able to elicit    Oral/Motor/Sensory Function Overall Oral Motor/Sensory Function: Within functional limits   Ice Chips Ice chips: Within functional limits   Thin Liquid Thin Liquid: Impaired Pharyngeal  Phase Impairments: Multiple swallows    Nectar Thick Nectar Thick Liquid: Not tested   Honey Thick Honey Thick Liquid: Not tested   Puree Puree: Impaired Pharyngeal Phase Impairments: Multiple swallows   Solid     Solid: Impaired Presentation: Self Fed Pharyngeal Phase Impairments: Multiple swallows     Aline August, Student SLP Office: [phone]  10/20/2019,9:28 AM

## 2019-10-20 NOTE — Progress Notes (Signed)
PROGRESS NOTE    Crystal Kidd  [id number] DOB: 23-Oct-1945 DOA: 10/18/2019 PCP: [name], MD    Brief Narrative:  74 y.o. female with a past medical history that includes hypothyroidism, hypertension, GERD and eosinophilic esophagitis admitted March 8 with worsening dysphagia unable to swallow food or pills and only small sips of liquid.  History of eosinophilic esophagitis treated with Diflucan and nystatin for suspected Candida.  Morning she is light difficulty managing her own secretions  Assessment & Plan:   Principal Problem:   Esophageal dysphagia Active Problems:   Hypothyroidism   Essential hypertension   Dysphagia   Hyponatremia   #1.  Dysphagia.  Patient with a history of same.  Followed by GI.  EGD performed 3/9, found to be unrevealing.  Continues to have issues with dysphagia. -Per GI and SLP, plan for modified barium swallow today -Will keep NPO for now  #2.  Hyponatremia.  To decreased oral intake over the last 2 weeks.  This morning with IV fluids.  Sodium now normalized from 126 on admission -Improved with gentle IV fluids -Monitor closely -See #1  #3.  Acute kidney injury.  Likely related to dehydration in the setting of decreased oral input secondary to #1.  Serum creatinine 1.53 on admission.  Chart review indicates creatinine 0.78 3 weeks ago. -Continue IV fluids -Recheck basic metabolic panel this afternoon -Adjust IV fluids as indicated -Hold nephrotoxins -Monitor urine output -Repeat bmet in AM  #4.  Hypertension.  Fair control.  Home medications include hydrochlorothiazide, losartan.  Patient unable to take pills due to #1. -Monitor closely -Cont PRN hydralazine as needed  5.  Hypothyroidism -Continue home Synthroid via IV  DVT prophylaxis: Will start SCD's Code Status: Full Family Communication: Pt in room, family not at bedside Disposition Plan: From home, d/c home when able to tolerate PO and when cleared by  GI  Consultants:   GI  Procedures:     Antimicrobials: Anti-infectives (From admission, onward)   Start     Dose/Rate Route Frequency Ordered Stop   10/20/19 1330  fluconazole (DIFLUCAN) IVPB 100 mg  Status:  Discontinued     100 mg 50 mL/hr over 60 Minutes Intravenous Every 24 hours 10/19/19 1220 10/19/19 1440   10/19/19 1330  fluconazole (DIFLUCAN) IVPB 200 mg  Status:  Discontinued     200 mg 100 mL/hr over 60 Minutes Intravenous  Once 10/19/19 1220 10/19/19 1440       Subjective: Still having trouble swallowing even sips this AM  Objective: Vitals:   10/19/19 2342 10/20/19 0428 10/20/19 0500 10/20/19 0750  BP: (!) 112/54 (!) 102/57  (!) 112/57  Pulse: 81 71  79  Resp: 15 18  18   Temp:  98 F (36.7 C)  98.4 F (36.9 C)  TempSrc: Oral Oral  Oral  SpO2: 93% 94%  98%  Weight:   61.2 kg   Height:        Intake/Output Summary (Last 24 hours) at 10/20/2019 1324 Last data filed at 10/19/2019 1800 Gross per 24 hour  Intake 187.53 ml  Output --  Net 187.53 ml   Filed Weights   10/19/19 0249 10/20/19 0500  Weight: 59.1 kg 61.2 kg    Examination:  General exam: Appears calm and comfortable  Respiratory system: Clear to auscultation. Respiratory effort normal. Cardiovascular system: S1 & S2 heard, Regular Gastrointestinal system: Abdomen is nondistended, soft and nontender. No organomegaly or masses felt. Normal bowel sounds heard. Central nervous system: Alert and  oriented. No focal neurological deficits. Extremities: Symmetric 5 x 5 power. Skin: No rashes, lesions Psychiatry: Judgement and insight appear normal. Mood & affect appropriate.   Data Reviewed: I have personally reviewed following labs and imaging studies  CBC: Recent Labs  Lab 10/17/19 1709 10/18/19 2315  WBC 6.2 9.3  NEUTROABS 4.3 6.8  HGB 14.4 15.0  HCT 40.5 43.2  MCV 84.2 85.5  PLT 334 [id number]   Basic Metabolic Panel: Recent Labs  Lab 10/17/19 1709 10/18/19 2315 10/19/19 1135  10/20/19 0404  NA 126* 133* 135 141  K 3.7 3.5 3.6 3.2*  CL 93* 96* 101 110  CO2 23 24 23  21*  GLUCOSE 128* 108* 96 59*  BUN 5* 10 13 15   CREATININE 0.81 1.53* 2.16* 1.73*  CALCIUM 9.2 9.7 8.5* 8.5*   GFR: Estimated Creatinine Clearance: 23.6 mL/min (A) (by C-G formula based on SCr of 1.73 mg/dL (H)). Liver Function Tests: Recent Labs  Lab 10/17/19 1709 10/18/19 2315  AST 24 21  ALT 17 16  ALKPHOS 101 98  BILITOT 0.9 1.1  PROT 6.6 6.9  ALBUMIN 3.9 4.1   No results for input(s): LIPASE, AMYLASE in the last 168 hours. No results for input(s): AMMONIA in the last 168 hours. Coagulation Profile: No results for input(s): INR, PROTIME in the last 168 hours. Cardiac Enzymes: No results for input(s): CKTOTAL, CKMB, CKMBINDEX, TROPONINI in the last 168 hours. BNP (last 3 results) No results for input(s): PROBNP in the last 8760 hours. HbA1C: No results for input(s): HGBA1C in the last 72 hours. CBG: No results for input(s): GLUCAP in the last 168 hours. Lipid Profile: No results for input(s): CHOL, HDL, LDLCALC, TRIG, CHOLHDL, LDLDIRECT in the last 72 hours. Thyroid Function Tests: No results for input(s): TSH, T4TOTAL, FREET4, T3FREE, THYROIDAB in the last 72 hours. Anemia Panel: No results for input(s): VITAMINB12, FOLATE, FERRITIN, TIBC, IRON, RETICCTPCT in the last 72 hours. Sepsis Labs: No results for input(s): PROCALCITON, LATICACIDVEN in the last 168 hours.  Recent Results (from the past 240 hour(s))  SARS CORONAVIRUS 2 (TAT 6-24 HRS) Nasopharyngeal Nasopharyngeal Swab     Status: None   Collection Time: 10/19/19 12:04 AM   Specimen: Nasopharyngeal Swab  Result Value Ref Range Status   SARS Coronavirus 2 NEGATIVE NEGATIVE Final    Comment: (NOTE) SARS-CoV-2 target nucleic acids are NOT DETECTED. The SARS-CoV-2 RNA is generally detectable in upper and lower respiratory specimens during the acute phase of infection. Negative results do not preclude SARS-CoV-2  infection, do not rule out co-infections with other pathogens, and should not be used as the sole basis for treatment or other patient management decisions. Negative results must be combined with clinical observations, patient history, and epidemiological information. The expected result is Negative. Fact Sheet for Patients: SugarRoll.be Fact Sheet for Healthcare Providers: https://www.woods-mathews.com/ This test is not yet approved or cleared by the [place] FDA and  has been authorized for detection and/or diagnosis of SARS-CoV-2 by FDA under an Emergency Use Authorization (EUA). This EUA will remain  in effect (meaning this test can be used) for the duration of the COVID-19 declaration under Section 56 4(b)(1) of the Act, 21 U.S.C. section 360bbb-3(b)(1), unless the authorization is terminated or revoked sooner. Performed at [facility], [place] [address], [place], [place] [address]      Radiology Studies: No results found.  Scheduled Meds: . levothyroxine  50 mcg Intravenous Daily  . pantoprazole (PROTONIX) IV  40 mg Intravenous Q12H  .  potassium chloride  40 mEq Oral Once  . sodium chloride flush  3 mL Intravenous Q12H   Continuous Infusions:   LOS: 1 day   Marylu Lund, MD [facility] Pager On Amion  If 7PM-7AM, please contact night-coverage 10/20/2019, 1:24 PM

## 2019-10-21 DIAGNOSIS — E871 Hypo-osmolality and hyponatremia: Secondary | ICD-10-CM

## 2019-10-21 LAB — CBC
HCT: 34.2 % — ABNORMAL LOW (ref 36.0–46.0)
Hemoglobin: 11.4 g/dL — ABNORMAL LOW (ref 12.0–15.0)
MCH: 29.7 pg (ref 26.0–34.0)
MCHC: 33.3 g/dL (ref 30.0–36.0)
MCV: 89.1 fL (ref 80.0–100.0)
Platelets: 268 10*3/uL (ref 150–400)
RBC: 3.84 MIL/uL — ABNORMAL LOW (ref 3.87–5.11)
RDW: 13.2 % (ref 11.5–15.5)
WBC: 5.9 10*3/uL (ref 4.0–10.5)
nRBC: 0 % (ref 0.0–0.2)

## 2019-10-21 LAB — COMPREHENSIVE METABOLIC PANEL
ALT: 13 U/L (ref 0–44)
AST: 18 U/L (ref 15–41)
Albumin: 2.9 g/dL — ABNORMAL LOW (ref 3.5–5.0)
Alkaline Phosphatase: 72 U/L (ref 38–126)
Anion gap: 14 (ref 5–15)
BUN: 19 mg/dL (ref 8–23)
CO2: 19 mmol/L — ABNORMAL LOW (ref 22–32)
Calcium: 8.9 mg/dL (ref 8.9–10.3)
Chloride: 111 mmol/L (ref 98–111)
Creatinine, Ser: 1.27 mg/dL — ABNORMAL HIGH (ref 0.44–1.00)
GFR calc Af Amer: 48 mL/min — ABNORMAL LOW (ref >60)
GFR calc non Af Amer: 42 mL/min — ABNORMAL LOW (ref >60)
Glucose, Bld: 48 mg/dL — ABNORMAL LOW (ref 70–99)
Potassium: 4 mmol/L (ref 3.5–5.1)
Sodium: 144 mmol/L (ref 135–145)
Total Bilirubin: 1.3 mg/dL — ABNORMAL HIGH (ref 0.3–1.2)
Total Protein: 5.1 g/dL — ABNORMAL LOW (ref 6.5–8.1)

## 2019-10-21 MED ORDER — METOCLOPRAMIDE HCL 10 MG PO TABS
5.0000 mg | ORAL_TABLET | Freq: Three times a day (TID) | ORAL | Status: DC
  Administered 2019-10-21 – 2019-10-22 (×3): 5 mg via ORAL
  Filled 2019-10-21 (×3): qty 1

## 2019-10-21 NOTE — Progress Notes (Signed)
  Speech Language Pathology Treatment: Dysphagia  Patient Details Name: CINTYA DAUGHETY MRN: [id number] DOB: 10/23/1945 Today's Date: 10/21/2019 Time: 0511-0211 SLP Time Calculation (min) (ACUTE ONLY): 10 min  Assessment / Plan / Recommendation Clinical Impression  Pt seen at bedside for skilled ST targeting swallow function. ST reviewed MBSS completed yesterday, including showing patient some recorded clips and explaining her swallow appears to be functionally normal based on the results. Pt verbalized understanding. Pt reports "I've been telling myself it's okay to swallow." In session, patient seen with self administered cup sips of thin liquid ginger ale and bites of applesauce. No overt s/sx aspiration or distress. ST recommended patient sit upright for PO intake, take small bites/sips. She verbalized understanding. ST to sign off at this time. Please re-consult should new needs arise.   HPI HPI: Pt is a 74 yo female presenting after recent dx of EOE with worsening dysphagia. At the time of admission, pt reported only being able to swallow small sips of liquids. GI and SLP were both consulted for swallowing. PMH includes: hypothyroidism, HTN, HLD, GERD/esophageal stricture. EGD (3/9) revealing gastritis, but no significant difficulties.      SLP Plan  All goals met       Recommendations  Compensations: Slow rate;Small sips/bites Postural Changes and/or Swallow Maneuvers: Seated upright 90 degrees                Oral Care Recommendations: Patient independent with oral care Follow up Recommendations: None SLP Visit Diagnosis: Dysphagia, unspecified (R13.10) Plan: All goals met       GO                Natlie Asfour 10/21/2019, 4:45 PM Marina Goodell, M.Ed., CCC-SLP Speech Therapy Acute Rehabilitation [phone]: Acute Rehab office [phone] - pager

## 2019-10-21 NOTE — Progress Notes (Signed)
PROGRESS NOTE    Crystal Kidd  [id number] DOB: 04/15/1946 DOA: 10/18/2019 PCP: [name], MD    Brief Narrative:  74 y.o. female with a past medical history that includes hypothyroidism, hypertension, GERD and eosinophilic esophagitis admitted March 8 with worsening dysphagia unable to swallow food or pills and only small sips of liquid.  History of eosinophilic esophagitis treated with Diflucan and nystatin for suspected Candida.  Morning she is light difficulty managing her own secretions  Assessment & Plan:   Principal Problem:   Esophageal dysphagia Active Problems:   Hypothyroidism   Essential hypertension   Dysphagia   Hyponatremia   #1.  Dysphagia.  Patient with a history of same.  Followed by GI.  EGD performed 3/9, found to be unrevealing.  Continues to have issues with dysphagia. -Per GI and SLP, underwent MBS  -Per GI, uncertain etiology, but possible functional. Per GI, EGD, eval by speech path, as well as barium swallow without significant abnormality  #2.  Hyponatremia.  To decreased oral intake over the last 2 weeks.  This morning with IV fluids.  Sodium now normalized from 126 on admission -Improved with gentle IV fluids -Cont to follow bmet -See #1  #3.  Acute kidney injury.  Likely related to dehydration in the setting of decreased oral input secondary to #1.  Serum creatinine 1.53 on admission.  Chart review indicates creatinine 0.78 3 weeks ago. -Continue IV fluids -Recheck basic metabolic panel this afternoon -Adjust IV fluids as indicated -Have been holding nephrotoxins -Monitor urine output -Repeat bmet in AM  #4.  Hypertension.  Fair control.  Home medications include hydrochlorothiazide, losartan. -cont to monior -Cont PRN hydralazine as needed -Currently beginning to advance diet. Would resume PO meds when pt is able to reliably tolerate PO intake  5.  Hypothyroidism -Continue home Synthroid via IV  DVT prophylaxis: Will start  SCD's Code Status: Full Family Communication: Pt in room, family not at bedside Disposition Plan: From home, d/c home when successfully advanced to soft diet, currently still on clears this AM  Consultants:   GI  Procedures:     Antimicrobials: Anti-infectives (From admission, onward)   Start     Dose/Rate Route Frequency Ordered Stop   10/20/19 1330  fluconazole (DIFLUCAN) IVPB 100 mg  Status:  Discontinued     100 mg 50 mL/hr over 60 Minutes Intravenous Every 24 hours 10/19/19 1220 10/19/19 1440   10/19/19 1330  fluconazole (DIFLUCAN) IVPB 200 mg  Status:  Discontinued     200 mg 100 mL/hr over 60 Minutes Intravenous  Once 10/19/19 1220 10/19/19 1440      Subjective: Still reporting difficulty swallowing, but able to drink liquids without difficulty in room  Objective: Vitals:   10/20/19 2335 10/21/19 0338 10/21/19 0810 10/21/19 1213  BP: 135/62 133/69 (!) 124/57 (!) 141/62  Pulse: 88 92 86 93  Resp: 18 18 18 18   Temp: 98.1 F (36.7 C) 98.3 F (36.8 C) 98.2 F (36.8 C) 98.1 F (36.7 C)  TempSrc: Oral Oral Oral Oral  SpO2: 96% 99% 95% 96%  Weight:      Height:       No intake or output data in the 24 hours ending 10/21/19 1348 Filed Weights   10/19/19 0249 10/20/19 0500  Weight: 59.1 kg 61.2 kg    Examination: General exam: Awake, laying in bed, in nad Respiratory system: Normal respiratory effort, no wheezing Cardiovascular system: regular rate, s1, s2 Gastrointestinal system: Soft, nondistended, positive BS  Central nervous system: CN2-12 grossly intact, strength intact Extremities: Perfused, no clubbing Skin: Normal skin turgor, no notable skin lesions seen Psychiatry: Mood normal // no visual hallucinations   Data Reviewed: I have personally reviewed following labs and imaging studies  CBC: Recent Labs  Lab 10/17/19 1709 10/18/19 2315 10/21/19 0426  WBC 6.2 9.3 5.9  NEUTROABS 4.3 6.8  --   HGB 14.4 15.0 11.4*  HCT 40.5 43.2 34.2*  MCV 84.2  85.5 89.1  PLT 334 347 [id number]   Basic Metabolic Panel: Recent Labs  Lab 10/17/19 1709 10/18/19 2315 10/19/19 1135 10/20/19 0404 10/21/19 0426  NA 126* 133* 135 141 144  K 3.7 3.5 3.6 3.2* 4.0  CL 93* 96* 101 110 111  CO2 23 24 23  21* 19*  GLUCOSE 128* 108* 96 59* 48*  BUN 5* 10 13 15 19   CREATININE 0.81 1.53* 2.16* 1.73* 1.27*  CALCIUM 9.2 9.7 8.5* 8.5* 8.9   GFR: Estimated Creatinine Clearance: 32.1 mL/min (A) (by C-G formula based on SCr of 1.27 mg/dL (H)). Liver Function Tests: Recent Labs  Lab 10/17/19 1709 10/18/19 2315 10/21/19 0426  AST 24 21 18   ALT 17 16 13   ALKPHOS 101 98 72  BILITOT 0.9 1.1 1.3*  PROT 6.6 6.9 5.1*  ALBUMIN 3.9 4.1 2.9*   No results for input(s): LIPASE, AMYLASE in the last 168 hours. No results for input(s): AMMONIA in the last 168 hours. Coagulation Profile: No results for input(s): INR, PROTIME in the last 168 hours. Cardiac Enzymes: No results for input(s): CKTOTAL, CKMB, CKMBINDEX, TROPONINI in the last 168 hours. BNP (last 3 results) No results for input(s): PROBNP in the last 8760 hours. HbA1C: No results for input(s): HGBA1C in the last 72 hours. CBG: No results for input(s): GLUCAP in the last 168 hours. Lipid Profile: No results for input(s): CHOL, HDL, LDLCALC, TRIG, CHOLHDL, LDLDIRECT in the last 72 hours. Thyroid Function Tests: No results for input(s): TSH, T4TOTAL, FREET4, T3FREE, THYROIDAB in the last 72 hours. Anemia Panel: No results for input(s): VITAMINB12, FOLATE, FERRITIN, TIBC, IRON, RETICCTPCT in the last 72 hours. Sepsis Labs: No results for input(s): PROCALCITON, LATICACIDVEN in the last 168 hours.  Recent Results (from the past 240 hour(s))  SARS CORONAVIRUS 2 (TAT 6-24 HRS) Nasopharyngeal Nasopharyngeal Swab     Status: None   Collection Time: 10/19/19 12:04 AM   Specimen: Nasopharyngeal Swab  Result Value Ref Range Status   SARS Coronavirus 2 NEGATIVE NEGATIVE Final    Comment: (NOTE) SARS-CoV-2 target  nucleic acids are NOT DETECTED. The SARS-CoV-2 RNA is generally detectable in upper and lower respiratory specimens during the acute phase of infection. Negative results do not preclude SARS-CoV-2 infection, do not rule out co-infections with other pathogens, and should not be used as the sole basis for treatment or other patient management decisions. Negative results must be combined with clinical observations, patient history, and epidemiological information. The expected result is Negative. Fact Sheet for Patients: SugarRoll.be Fact Sheet for Healthcare Providers: https://www.woods-mathews.com/ This test is not yet approved or cleared by the [place] FDA and  has been authorized for detection and/or diagnosis of SARS-CoV-2 by FDA under an Emergency Use Authorization (EUA). This EUA will remain  in effect (meaning this test can be used) for the duration of the COVID-19 declaration under Section 56 4(b)(1) of the Act, 21 U.S.C. section 360bbb-3(b)(1), unless the authorization is terminated or revoked sooner. Performed at [facility], [place] [address] [place], [place] [address]  Radiology Studies: DG Swallowing Func-Speech Pathology  Result Date: 10/20/2019 Objective Swallowing Evaluation: Type of Study: MBS-Modified Barium Swallow Study  Patient Details Name: Crystal Kidd MRN: [id number] Date of Birth: May 09, 1946 Today's Date: 10/20/2019 Time: SLP Start Time (ACUTE ONLY): 1232 -SLP Stop Time (ACUTE ONLY): 1249 SLP Time Calculation (min) (ACUTE ONLY): 17 min Past Medical History: Past Medical History: Diagnosis Date . Allergic rhinoconjunctivitis  . Allergy 1992 . Arthritis  . Cataract  . Diverticulosis  . Eosinophilic esophagitis  . GERD (gastroesophageal reflux disease)   PMH of esophageal stricture . Hyperlipidemia  . Hypertension  . Osteopenia   BMD @[facility]  . Thyroid disease   hypothyroidism . Transfusion history 1968  post partum .  Trapezius muscle spasm 04/13/2015 . Vitiligo  Past Surgical History: Past Surgical History: Procedure Laterality Date . ABDOMINAL HYSTERECTOMY    for dysfunctional menses; USO with TAH . APPENDECTOMY   . BREAST EXCISIONAL BIOPSY Right 1972  NEG . BREAST EXCISIONAL BIOPSY Left 1980'S  NEG . BREAST SURGERY    biopsy X 2 . CHOLECYSTECTOMY N/A 04/30/2016  Procedure: LAPAROSCOPIC CHOLECYSTECTOMY;  Surgeon: Georganna Skeans, MD;  Location: [facility];  Service: General;  Laterality: N/A; . COLONOSCOPY    X2; Tics; Dr Fuller Plan, GI . ESOPHAGEAL DILATION    X2 . ESOPHAGOGASTRODUODENOSCOPY (EGD) WITH PROPOFOL N/A 10/19/2019  Procedure: ESOPHAGOGASTRODUODENOSCOPY (EGD) WITH PROPOFOL;  Surgeon: Mauri Pole, MD;  Location: [facility];  Service: Endoscopy;  Laterality: N/A; . EYE SURGERY   . FRACTURE SURGERY  2003or 2004 . ROTATOR CUFF REPAIR    R shoulder . TUBAL LIGATION    befor hysterectomy . UPPER GASTROINTESTINAL ENDOSCOPY   HPI: Pt is a 74 yo female presenting after recent dx of EOE with worsening dysphagia. At the time of admission, pt reported only being able to swallow small sips of liquids. GI and SLP were both consulted for swallowing. PMH includes: hypothyroidism, HTN, HLD, GERD/esophageal stricture. EGD (3/9) revealing gastritis, but no significant difficulties.  Subjective: cooperative Assessment / Plan / Recommendation CHL IP CLINICAL IMPRESSIONS 10/20/2019 Clinical Impression Patient presents with oropharyngeal swallow that is [facility]. During the study the pt complained of "something stuck in her throat," but her pharynx remained clear. Following bites of regular texture solids, pt was noted to have trace-mild esophageal backflow into the pharynx, but reduced as the pt utilized a liquid wash or took multiple swallows. As observed in the bedside swallow evaluation, pt also had significant belching following all trials. As far as an oropharyngeal standpoint, recommend thin liquids and up to regular texture solids, ensuring  the pt sits upright >30 minutes following meals, but do recommend proceeding with esophagram. Will defer solid advancement to GI/MD.  SLP Visit Diagnosis Dysphagia, unspecified (R13.10) Attention and concentration deficit following -- Frontal lobe and executive function deficit following -- Impact on safety and function No limitations   CHL IP TREATMENT RECOMMENDATION 10/20/2019 Treatment Recommendations Therapy as outlined in treatment plan below   Prognosis 10/20/2019 Prognosis for Safe Diet Advancement Good Barriers to Reach Goals -- Barriers/Prognosis Comment -- CHL IP DIET RECOMMENDATION 10/20/2019 SLP Diet Recommendations Thin liquid;Regular solids Liquid Administration via Cup;Spoon;Straw Medication Administration Crushed with puree Compensations Slow rate;Small sips/bites Postural Changes Remain semi-upright after after feeds/meals (Comment);Seated upright at 90 degrees   CHL IP OTHER RECOMMENDATIONS 10/20/2019 Recommended Consults Consider GI evaluation;Consider esophageal assessment Oral Care Recommendations Patient independent with oral care Other Recommendations --   CHL IP FOLLOW UP RECOMMENDATIONS 10/20/2019 Follow up Recommendations Other (comment)   CHL IP  FREQUENCY AND DURATION 10/20/2019 Speech Therapy Frequency (ACUTE ONLY) min 2x/week Treatment Duration 2 weeks      CHL IP ORAL PHASE 10/20/2019 Oral Phase WFL Oral - Pudding Teaspoon -- Oral - Pudding Cup -- Oral - Honey Teaspoon -- Oral - Honey Cup -- Oral - Nectar Teaspoon -- Oral - Nectar Cup -- Oral - Nectar Straw -- Oral - Thin Teaspoon -- Oral - Thin Cup -- Oral - Thin Straw -- Oral - Puree -- Oral - Mech Soft -- Oral - Regular -- Oral - Multi-Consistency -- Oral - Pill -- Oral Phase - Comment --  No flowsheet data found. CHL IP CERVICAL ESOPHAGEAL PHASE 10/20/2019 Cervical Esophageal Phase WFL Pudding Teaspoon -- Pudding Cup -- Honey Teaspoon -- Honey Cup -- Nectar Teaspoon -- Nectar Cup -- Nectar Straw -- Thin Teaspoon -- Thin Cup -- Thin Straw --  Puree -- Mechanical Soft -- Regular Esophageal backflow into the pharynx Multi-consistency -- Pill -- Cervical Esophageal Comment -- Osie Bond., M.A. CCC-SLP Acute Rehabilitation Services Pager [phone] Office [phone] 10/20/2019, 2:20 PM              DG ESOPHAGUS W SINGLE CM (SOL OR THIN BA)  Result Date: 10/20/2019 CLINICAL DATA:  Difficulty swallowing EXAM: ESOPHOGRAM/BARIUM SWALLOW TECHNIQUE: Single contrast examination was performed using thin barium. FLUOROSCOPY TIME:  Fluoroscopy Time:  42 seconds COMPARISON:  None. FINDINGS: Patient swallowed barium without difficulty. There is no mass or stricture. Patient was belching multiple times with each swallow and there is significant air within the esophagus despite this being a single contrast study. Apparent dysmotility of the distal esophagus. There is no hiatal hernia. IMPRESSION: No mass or stricture. Distal esophageal dysmotility. Significant air within the esophagus with each swallow with patient belching throughout. Electronically Signed   By: Macy Mis M.D.   On: 10/20/2019 14:34    Scheduled Meds: . levothyroxine  50 mcg Intravenous Daily  . metoCLOPramide  5 mg Oral TID AC  . pantoprazole (PROTONIX) IV  40 mg Intravenous Q12H  . sodium chloride flush  3 mL Intravenous Q12H   Continuous Infusions:   LOS: 2 days   Marylu Lund, MD [facility] Pager On Amion  If 7PM-7AM, please contact night-coverage 10/21/2019, 1:48 PM

## 2019-10-21 NOTE — Progress Notes (Signed)
Pt tolerated full liquid diet for lunch, waiting for soft diet for dinner.

## 2019-10-22 ENCOUNTER — Telehealth: Payer: Self-pay | Admitting: Internal Medicine

## 2019-10-22 LAB — COMPREHENSIVE METABOLIC PANEL
ALT: 15 U/L (ref 0–44)
AST: 18 U/L (ref 15–41)
Albumin: 2.9 g/dL — ABNORMAL LOW (ref 3.5–5.0)
Alkaline Phosphatase: 71 U/L (ref 38–126)
Anion gap: 10 (ref 5–15)
BUN: 18 mg/dL (ref 8–23)
CO2: 22 mmol/L (ref 22–32)
Calcium: 9 mg/dL (ref 8.9–10.3)
Chloride: 108 mmol/L (ref 98–111)
Creatinine, Ser: 0.99 mg/dL (ref 0.44–1.00)
GFR calc Af Amer: >60 mL/min (ref >60)
GFR calc non Af Amer: 56 mL/min — ABNORMAL LOW (ref >60)
Glucose, Bld: 86 mg/dL (ref 70–99)
Potassium: 4 mmol/L (ref 3.5–5.1)
Sodium: 140 mmol/L (ref 135–145)
Total Bilirubin: 0.9 mg/dL (ref 0.3–1.2)
Total Protein: 5 g/dL — ABNORMAL LOW (ref 6.5–8.1)

## 2019-10-22 MED ORDER — LACTULOSE 20 GM/30ML PO SOLN: 20.0000 g | Freq: Every day | ORAL | 0 refills | Status: DC | PRN

## 2019-10-22 MED ORDER — METOCLOPRAMIDE HCL 5 MG PO TABS: 5.0000 mg | ORAL_TABLET | Freq: Three times a day (TID) | ORAL | 0 refills | Status: DC

## 2019-10-22 MED FILL — METOCLOPRAMIDE 5 MG TABLET: 5 | 30 days supply | Qty: 90 | Fill #0

## 2019-10-22 MED FILL — LACTULOSE 10 GM/15 ML SOLN: 10 | 16 days supply | Qty: 473 | Fill #0

## 2019-10-22 NOTE — Progress Notes (Signed)
Patient discharged out via wheelchair; instructions were given by Courtlanda, RN  Rx's delivered and belongings went with patient.

## 2019-10-22 NOTE — Plan of Care (Signed)
Patient stable, discussed POC with patient, agreeable with plan, denies question/concerns at this time.  

## 2019-10-22 NOTE — Progress Notes (Signed)
Patient tolerated breakfast.

## 2019-10-22 NOTE — Discharge Summary (Signed)
Physician Discharge Summary  MEGHANNE STEEGE [id number] DOB: [birth year] DOA: 10/18/2019  PCP: [name], MD  Admit date: 10/18/2019 Discharge date: 10/22/2019  Admitted From: Home Disposition:  Home  Recommendations for Outpatient Follow-up:  1. Follow up with PCP in 1-2 weeks 2. Recommend follow up with GI as needed  Discharge Condition:Improved CODE STATUS:Full Diet recommendation: Soft   Brief/Interim Summary: 74 y.o.femalewith a past medical history that includes hypothyroidism, hypertension, GERD and eosinophilic esophagitisadmittedMarch 8 with worsening dysphagia unable to swallow food or pills and only small sips of liquid. History of eosinophilic esophagitis treated with Diflucan and nystatin for suspected Candida. Morning she is light difficulty managing her own secretions  Discharge Diagnoses:  Principal Problem:   Esophageal dysphagia Active Problems:   Hypothyroidism   Essential hypertension   Dysphagia   Hyponatremia  #1. Dysphagia. Patient with a history of same. Followed by GI. EGD performed 3/9, found to be unrevealing. Continues to have issues with dysphagia. -Per GI and SLP, underwent MBS  -Per GI, uncertain etiology, but possible functional. Per GI, EGD, eval by speech path, as well as barium swallow without significant abnormality -given trial of reglan. Successfully advanced diet to soft diet -Consider f/u with GI as outpateint  #2. Hyponatremia. To decreased oral intake over the last 2 weeks. This morning with IV fluids. Sodium now normalized from 126 on admission -Improved with gentle IV fluids -See #1  #3. Acute kidney injury. Likely related to dehydration in the setting of decreased oral input secondary to #1. Serum creatinine 1.53 on admission. Chart review indicates creatinine 0.78 3 weeks ago. -Have been holding nephrotoxins while in hospital -Renal function improved with IVF  #4. Hypertension.Fair control.Home  medications include hydrochlorothiazide, losartan. -cont to monior -Cont PRN hydralazine as needed while in hospital -Cont oral bp meds on d/c  5. Hypothyroidism -Continue home Synthroid via IV while in hospital -Resume oral meds on d/c  Discharge Instructions   Allergies as of 10/22/2019      Reactions   Besivance [besifloxacin Hcl] Shortness Of Breath, Other (See Comments)   Dizziness   Alendronate Sodium Other (See Comments)   REACTION: intolerant ( PMH of esophageal stricture)   Dilaudid [hydromorphone Hcl] Itching   Nasal itching; resolution with Benadryl   Codeine Nausea And Vomiting, Other (See Comments)   hallucinations   Flonase [fluticasone]    Mouth blisters   Ibandronate Sodium Other (See Comments)    bone pain      Medication List    STOP taking these medications   benzonatate 100 MG capsule Commonly known as: TESSALON   Flovent HFA 220 MCG/ACT inhaler Generic drug: fluticasone   fluconazole 100 MG tablet Commonly known as: Diflucan   fluticasone 50 MCG/ACT nasal spray Commonly known as: FLONASE   hydrochlorothiazide 25 MG tablet Commonly known as: HYDRODIURIL   magic mouthwash Soln   ondansetron 4 MG disintegrating tablet Commonly known as: Zofran ODT     TAKE these medications   gabapentin 100 MG capsule Commonly known as: NEURONTIN TAKE 1 CAPSULE (100 MG TOTAL) BY MOUTH 3 (THREE) TIMES DAILY. EVERY 8 HOURS AS NEEDED What changed: See the new instructions.   Lactulose 20 GM/30ML Soln Take 30 mLs (20 g total) by mouth daily as needed (for constipation).   levothyroxine 75 MCG tablet Commonly known as: SYNTHROID TAKE 1 TABLET (75 MCG TOTAL) BY MOUTH DAILY BEFORE BREAKFAST.   losartan 100 MG tablet Commonly known as: COZAAR TAKE 1 TABLET EVERY DAY   metoCLOPramide  5 MG tablet Commonly known as: REGLAN Take 1 tablet (5 mg total) by mouth 3 (three) times daily before meals. What changed:   medication strength  how much to  take  how to take this  when to take this  additional instructions   nystatin 100000 UNIT/ML suspension Commonly known as: MYCOSTATIN Take 5 mLs (500,000 Units total) by mouth 3 (three) times daily. SWISH AND SWALLOW THREE TIMES A DAY   omeprazole 40 MG capsule Commonly known as: PRILOSEC TAKE 1 CAPSULE EVERY DAY   polyethylene glycol 17 g packet Commonly known as: MIRALAX / GLYCOLAX Take 17 g by mouth daily.   simvastatin 20 MG tablet Commonly known as: ZOCOR TAKE 1 TABLET EVERY EVENING What changed: when to take this   SYSTANE OP Place 1 drop into both eyes 4 (four) times daily as needed (dry eyes).      Follow-up Information    [name], MD Follow up.   Specialty: Internal Medicine Why: As scheduled Contact information: [address] [place] [address] [phone]        Ladene Artist, MD. Schedule an appointment as soon as possible for a visit.   Specialty: Gastroenterology Contact information: [address] [address] [phone]          Allergies  Allergen Reactions  . Besivance [Besifloxacin Hcl] Shortness Of Breath and Other (See Comments)    Dizziness  . Alendronate Sodium Other (See Comments)    REACTION: intolerant ( PMH of esophageal stricture)  . Dilaudid [Hydromorphone Hcl] Itching    Nasal itching; resolution with Benadryl  . Codeine Nausea And Vomiting and Other (See Comments)    hallucinations  . Flonase [Fluticasone]     Mouth blisters  . Ibandronate Sodium Other (See Comments)     bone pain    Consultations:  GI  Procedures/Studies: DG Chest 2 View  Result Date: 10/17/2019 CLINICAL DATA:  Difficulty swallowing. EXAM: CHEST - 2 VIEW COMPARISON:  Chest x-ray 05/14/2016 FINDINGS: The cardiac silhouette, mediastinal and hilar contours are normal and stable. There is mild tortuosity and calcification of the thoracic aorta. Mild chronic bronchitic type interstitial lung changes but no  infiltrates, edema or effusions. The bony thorax is intact. IMPRESSION: No acute cardiopulmonary findings. Electronically Signed   By: Marijo Sanes M.D.   On: 10/17/2019 17:59   DG Swallowing Func-Speech Pathology  Result Date: 10/20/2019 Objective Swallowing Evaluation: Type of Study: MBS-Modified Barium Swallow Study  Patient Details Name: ADELLE DEWIRE MRN: [id number] Date of Birth: 17-Dec-1945 Today's Date: 10/20/2019 Time: SLP Start Time (ACUTE ONLY): 1232 -SLP Stop Time (ACUTE ONLY): 1249 SLP Time Calculation (min) (ACUTE ONLY): 17 min Past Medical History: Past Medical History: Diagnosis Date . Allergic rhinoconjunctivitis  . Allergy 1992 . Arthritis  . Cataract  . Diverticulosis  . Eosinophilic esophagitis  . GERD (gastroesophageal reflux disease)   PMH of esophageal stricture . Hyperlipidemia  . Hypertension  . Osteopenia   BMD @[facility]  . Thyroid disease   hypothyroidism . Transfusion history 1968  post partum . Trapezius muscle spasm 04/13/2015 . Vitiligo  Past Surgical History: Past Surgical History: Procedure Laterality Date . ABDOMINAL HYSTERECTOMY    for dysfunctional menses; USO with TAH . APPENDECTOMY   . BREAST EXCISIONAL BIOPSY Right 1972  NEG . BREAST EXCISIONAL BIOPSY Left 1980'S  NEG . BREAST SURGERY    biopsy X 2 . CHOLECYSTECTOMY N/A 04/30/2016  Procedure: LAPAROSCOPIC CHOLECYSTECTOMY;  Surgeon: Georganna Skeans, MD;  Location:  [facility] OR;  Service: General;  Laterality: N/A; . COLONOSCOPY    X2; Tics; Dr Fuller Plan, GI . ESOPHAGEAL DILATION    X2 . ESOPHAGOGASTRODUODENOSCOPY (EGD) WITH PROPOFOL N/A 10/19/2019  Procedure: ESOPHAGOGASTRODUODENOSCOPY (EGD) WITH PROPOFOL;  Surgeon: Mauri Pole, MD;  Location: [facility];  Service: Endoscopy;  Laterality: N/A; . EYE SURGERY   . FRACTURE SURGERY  2003or 2004 . ROTATOR CUFF REPAIR    R shoulder . TUBAL LIGATION    befor hysterectomy . UPPER GASTROINTESTINAL ENDOSCOPY   HPI: Pt is a 74 yo female presenting after recent dx of EOE with worsening dysphagia. At  the time of admission, pt reported only being able to swallow small sips of liquids. GI and SLP were both consulted for swallowing. PMH includes: hypothyroidism, HTN, HLD, GERD/esophageal stricture. EGD (3/9) revealing gastritis, but no significant difficulties.  Subjective: cooperative Assessment / Plan / Recommendation CHL IP CLINICAL IMPRESSIONS 10/20/2019 Clinical Impression Patient presents with oropharyngeal swallow that is [facility]. During the study the pt complained of "something stuck in her throat," but her pharynx remained clear. Following bites of regular texture solids, pt was noted to have trace-mild esophageal backflow into the pharynx, but reduced as the pt utilized a liquid wash or took multiple swallows. As observed in the bedside swallow evaluation, pt also had significant belching following all trials. As far as an oropharyngeal standpoint, recommend thin liquids and up to regular texture solids, ensuring the pt sits upright >30 minutes following meals, but do recommend proceeding with esophagram. Will defer solid advancement to GI/MD.  SLP Visit Diagnosis Dysphagia, unspecified (R13.10) Attention and concentration deficit following -- Frontal lobe and executive function deficit following -- Impact on safety and function No limitations   CHL IP TREATMENT RECOMMENDATION 10/20/2019 Treatment Recommendations Therapy as outlined in treatment plan below   Prognosis 10/20/2019 Prognosis for Safe Diet Advancement Good Barriers to Reach Goals -- Barriers/Prognosis Comment -- CHL IP DIET RECOMMENDATION 10/20/2019 SLP Diet Recommendations Thin liquid;Regular solids Liquid Administration via Cup;Spoon;Straw Medication Administration Crushed with puree Compensations Slow rate;Small sips/bites Postural Changes Remain semi-upright after after feeds/meals (Comment);Seated upright at 90 degrees   CHL IP OTHER RECOMMENDATIONS 10/20/2019 Recommended Consults Consider GI evaluation;Consider esophageal assessment Oral Care  Recommendations Patient independent with oral care Other Recommendations --   CHL IP FOLLOW UP RECOMMENDATIONS 10/20/2019 Follow up Recommendations Other (comment)   CHL IP FREQUENCY AND DURATION 10/20/2019 Speech Therapy Frequency (ACUTE ONLY) min 2x/week Treatment Duration 2 weeks      CHL IP ORAL PHASE 10/20/2019 Oral Phase WFL Oral - Pudding Teaspoon -- Oral - Pudding Cup -- Oral - Honey Teaspoon -- Oral - Honey Cup -- Oral - Nectar Teaspoon -- Oral - Nectar Cup -- Oral - Nectar Straw -- Oral - Thin Teaspoon -- Oral - Thin Cup -- Oral - Thin Straw -- Oral - Puree -- Oral - Mech Soft -- Oral - Regular -- Oral - Multi-Consistency -- Oral - Pill -- Oral Phase - Comment --  No flowsheet data found. CHL IP CERVICAL ESOPHAGEAL PHASE 10/20/2019 Cervical Esophageal Phase WFL Pudding Teaspoon -- Pudding Cup -- Honey Teaspoon -- Honey Cup -- Nectar Teaspoon -- Nectar Cup -- Nectar Straw -- Thin Teaspoon -- Thin Cup -- Thin Straw -- Puree -- Mechanical Soft -- Regular Esophageal backflow into the pharynx Multi-consistency -- Pill -- Cervical Esophageal Comment -- Osie Bond., M.A. [facility] Pager [phone] Office [phone] 10/20/2019, 2:20 PM  DG ESOPHAGUS W SINGLE CM (SOL OR THIN BA)  Result Date: 10/20/2019 CLINICAL DATA:  Difficulty swallowing EXAM: ESOPHOGRAM/BARIUM SWALLOW TECHNIQUE: Single contrast examination was performed using thin barium. FLUOROSCOPY TIME:  Fluoroscopy Time:  42 seconds COMPARISON:  None. FINDINGS: Patient swallowed barium without difficulty. There is no mass or stricture. Patient was belching multiple times with each swallow and there is significant air within the esophagus despite this being a single contrast study. Apparent dysmotility of the distal esophagus. There is no hiatal hernia. IMPRESSION: No mass or stricture. Distal esophageal dysmotility. Significant air within the esophagus with each swallow with patient belching throughout.  Electronically Signed   By: Macy Mis M.D.   On: 10/20/2019 14:34     Subjective: Tolerating soft diet overnight and this AM  Discharge Exam: Vitals:   10/22/19 0814 10/22/19 1202  BP: (!) 146/72 (!) 151/71  Pulse: 85 83  Resp: 18 18  Temp: 98.2 F (36.8 C) 98.6 F (37 C)  SpO2: 97% 97%   Vitals:   10/21/19 2330 10/22/19 0312 10/22/19 0814 10/22/19 1202  BP: 125/62 (!) 119/57 (!) 146/72 (!) 151/71  Pulse: 76 72 85 83  Resp: 18 18 18 18   Temp: 98.2 F (36.8 C) 98.3 F (36.8 C) 98.2 F (36.8 C) 98.6 F (37 C)  TempSrc: Oral Oral Oral Oral  SpO2: 95% 94% 97% 97%  Weight:      Height:        General: Pt is alert, awake, not in acute distress Cardiovascular: RRR, S1/S2 +, no rubs, no gallops Respiratory: CTA bilaterally, no wheezing, no rhonchi Abdominal: Soft, NT, ND, bowel sounds + Extremities: no edema, no cyanosis   The results of significant diagnostics from this hospitalization (including imaging, microbiology, ancillary and laboratory) are listed below for reference.     Microbiology: Recent Results (from the past 240 hour(s))  SARS CORONAVIRUS 2 (TAT 6-24 HRS) Nasopharyngeal Nasopharyngeal Swab     Status: None   Collection Time: 10/19/19 12:04 AM   Specimen: Nasopharyngeal Swab  Result Value Ref Range Status   SARS Coronavirus 2 NEGATIVE NEGATIVE Final    Comment: (NOTE) SARS-CoV-2 target nucleic acids are NOT DETECTED. The SARS-CoV-2 RNA is generally detectable in upper and lower respiratory specimens during the acute phase of infection. Negative results do not preclude SARS-CoV-2 infection, do not rule out co-infections with other pathogens, and should not be used as the sole basis for treatment or other patient management decisions. Negative results must be combined with clinical observations, patient history, and epidemiological information. The expected result is Negative. Fact Sheet for  Patients: SugarRoll.be Fact Sheet for Healthcare Providers: https://www.woods-mathews.com/ This test is not yet approved or cleared by the [place] FDA and  has been authorized for detection and/or diagnosis of SARS-CoV-2 by FDA under an Emergency Use Authorization (EUA). This EUA will remain  in effect (meaning this test can be used) for the duration of the COVID-19 declaration under Section 56 4(b)(1) of the Act, 21 U.S.C. section 360bbb-3(b)(1), unless the authorization is terminated or revoked sooner. Performed at [facility], [place] [address], [place], [place] [address]      Labs: BNP (last 3 results) No results for input(s): BNP in the last 8760 hours. Basic Metabolic Panel: Recent Labs  Lab 10/18/19 2315 10/19/19 1135 10/20/19 0404 10/21/19 0426 10/22/19 0503  NA 133* 135 141 144 140  K 3.5 3.6 3.2* 4.0 4.0  CL 96* 101 110 111 108  CO2 24 23 21*  19* 22  GLUCOSE 108* 96 59* 48* 86  BUN 10 13 15 19 18   CREATININE 1.53* 2.16* 1.73* 1.27* 0.99  CALCIUM 9.7 8.5* 8.5* 8.9 9.0   Liver Function Tests: Recent Labs  Lab 10/17/19 1709 10/18/19 2315 10/21/19 0426 10/22/19 0503  AST 24 21 18 18   ALT 17 16 13 15   ALKPHOS 101 98 72 71  BILITOT 0.9 1.1 1.3* 0.9  PROT 6.6 6.9 5.1* 5.0*  ALBUMIN 3.9 4.1 2.9* 2.9*   No results for input(s): LIPASE, AMYLASE in the last 168 hours. No results for input(s): AMMONIA in the last 168 hours. CBC: Recent Labs  Lab 10/17/19 1709 10/18/19 2315 10/21/19 0426  WBC 6.2 9.3 5.9  NEUTROABS 4.3 6.8  --   HGB 14.4 15.0 11.4*  HCT 40.5 43.2 34.2*  MCV 84.2 85.5 89.1  PLT 334 347 268   Cardiac Enzymes: No results for input(s): CKTOTAL, CKMB, CKMBINDEX, TROPONINI in the last 168 hours. BNP: Invalid input(s): POCBNP CBG: No results for input(s): GLUCAP in the last 168 hours. D-Dimer No results for input(s): DDIMER in the last 72 hours. Hgb A1c No results for input(s): HGBA1C in  the last 72 hours. Lipid Profile No results for input(s): CHOL, HDL, LDLCALC, TRIG, CHOLHDL, LDLDIRECT in the last 72 hours. Thyroid function studies No results for input(s): TSH, T4TOTAL, T3FREE, THYROIDAB in the last 72 hours.  Invalid input(s): FREET3 Anemia work up No results for input(s): VITAMINB12, FOLATE, FERRITIN, TIBC, IRON, RETICCTPCT in the last 72 hours. Urinalysis    Component Value Date/Time   COLORURINE YELLOW 05/04/2016 2113   APPEARANCEUR CLEAR 05/04/2016 2113   LABSPEC 1.010 05/04/2016 2113   PHURINE 8.0 05/04/2016 2113   GLUCOSEU NEGATIVE 05/04/2016 2113   GLUCOSEU NEGATIVE 01/02/2016 0956   HGBUR NEGATIVE 05/04/2016 2113   BILIRUBINUR NEGATIVE 05/04/2016 2113   BILIRUBINUR negative 04/09/2016 1150   KETONESUR NEGATIVE 05/04/2016 2113   PROTEINUR NEGATIVE 05/04/2016 2113   UROBILINOGEN 0.2 04/09/2016 1150   UROBILINOGEN 0.2 01/02/2016 0956   NITRITE NEGATIVE 05/04/2016 2113   LEUKOCYTESUR TRACE (A) 05/04/2016 2113   Sepsis Labs Invalid input(s): PROCALCITONIN,  WBC,  LACTICIDVEN Microbiology Recent Results (from the past 240 hour(s))  SARS CORONAVIRUS 2 (TAT 6-24 HRS) Nasopharyngeal Nasopharyngeal Swab     Status: None   Collection Time: 10/19/19 12:04 AM   Specimen: Nasopharyngeal Swab  Result Value Ref Range Status   SARS Coronavirus 2 NEGATIVE NEGATIVE Final    Comment: (NOTE) SARS-CoV-2 target nucleic acids are NOT DETECTED. The SARS-CoV-2 RNA is generally detectable in upper and lower respiratory specimens during the acute phase of infection. Negative results do not preclude SARS-CoV-2 infection, do not rule out co-infections with other pathogens, and should not be used as the sole basis for treatment or other patient management decisions. Negative results must be combined with clinical observations, patient history, and epidemiological information. The expected result is Negative. Fact Sheet for  Patients: SugarRoll.be Fact Sheet for Healthcare Providers: https://www.woods-mathews.com/ This test is not yet approved or cleared by the [place] FDA and  has been authorized for detection and/or diagnosis of SARS-CoV-2 by FDA under an Emergency Use Authorization (EUA). This EUA will remain  in effect (meaning this test can be used) for the duration of the COVID-19 declaration under Section 56 4(b)(1) of the Act, 21 U.S.C. section 360bbb-3(b)(1), unless the authorization is terminated or revoked sooner. Performed at [facility], [place] [address] [place], [place] [address]    Time spent: 88min  SIGNED:   Marylu Lund, MD  [facility] 10/22/2019, 12:11 PM  If 7PM-7AM, please contact night-coverage

## 2019-10-22 NOTE — TOC Transition Note (Signed)
Transition of Care [facility]) - CM/SW Discharge Note   Patient Details  Name: Crystal Kidd MRN: [id number] Date of Birth: 02-21-1946  [facility] CM/SW Contact:  Pollie Friar, RN Phone Number: 10/22/2019, 12:40 PM   Clinical Narrative:    Pt discharging home with self care. Pt has transportation home and states her sons can provide some intermittent supervision.  Discharge meds to be delivered to the room per [facility].   Final next level of care: Home/Self Care Barriers to Discharge: No Barriers Identified   Patient Goals and CMS Choice        Discharge Placement                       Discharge Plan and Services                                     Social Determinants of Health (SDOH) Interventions     Readmission Risk Interventions No flowsheet data found.

## 2019-10-22 NOTE — Telephone Encounter (Signed)
Pt called and said that she was released from the hospital on 10/21/19 and already has an appt with Dr. Derrel Nip scheduled for 10/26/19

## 2019-10-25 ENCOUNTER — Telehealth: Payer: Self-pay

## 2019-10-25 NOTE — Telephone Encounter (Signed)
Noted.  D/C on 3/12.  Will follow.

## 2019-10-25 NOTE — Telephone Encounter (Signed)
Transition Care Management Follow-up Telephone Call  Date of discharge and from where: 10/22/19 from [facility].  How have you been since you were released from the hospital? "I am a little weak. Eating very small meals throughout the day. My throat is a little sore and I am hesitant to swallow anything that isn't food." Denies nausea, emesis, diarrhea, ABD pain and all other symptoms.   Any questions or concerns? Discuss referral for food allergy with [facility] in [place].   Items Reviewed:  Did the pt receive and understand the discharge instructions provided? Yes.  Medications obtained and verified? Not using nystatin out of fear. Taking losartan with pudding. Breaking simvastatin in half to swallow. Taking all other scheduled medications without issues. Stop zofran, magic mouthwash, hctz, flonase, diflucan, flovent, tessalon.  Any new allergies since your discharge? None.  Dietary orders reviewed? Soft.  Do you have support at home? Yes  Functional Questionnaire: (I = Independent and D = Dependent) ADLs: I  Bathing/Dressing- I  Meal Prep- I  Eating- I  Maintaining continence- I  Transferring/Ambulation- I  Managing Meds- I  Follow up appointments reviewed:   PCP Hospital f/u appt confirmed?  Scheduled to see Dr. Derrel Nip on 10/26/19 @ 3:30.  Gastroenterology f/u appt confirmed?  Patient plans to schedule tomorrow.   Are transportation arrangements needed? No.  If their condition worsens, is the pt aware to call PCP or go to the Emergency Dept.?  Yes  Was the patient provided with contact information for the PCP's office or ED? Yes  Was the pt encouraged to call back with questions or concerns? Yes

## 2019-10-26 ENCOUNTER — Ambulatory Visit (INDEPENDENT_AMBULATORY_CARE_PROVIDER_SITE_OTHER): Payer: Medicare HMO | Admitting: Internal Medicine

## 2019-10-26 ENCOUNTER — Other Ambulatory Visit: Payer: Self-pay

## 2019-10-26 ENCOUNTER — Telehealth: Payer: Self-pay | Admitting: Internal Medicine

## 2019-10-26 ENCOUNTER — Encounter: Payer: Self-pay | Admitting: Internal Medicine

## 2019-10-26 VITALS — BP 136/74 | HR 91 | Temp 99.7°F | Ht 63.0 in | Wt 129.0 lb

## 2019-10-26 DIAGNOSIS — Z91018 Allergy to other foods: Secondary | ICD-10-CM | POA: Diagnosis not present

## 2019-10-26 DIAGNOSIS — R1319 Other dysphagia: Secondary | ICD-10-CM

## 2019-10-26 DIAGNOSIS — Z09 Encounter for follow-up examination after completed treatment for conditions other than malignant neoplasm: Secondary | ICD-10-CM

## 2019-10-26 DIAGNOSIS — R1310 Dysphagia, unspecified: Secondary | ICD-10-CM

## 2019-10-26 MED ORDER — HYDROCHLOROTHIAZIDE 25 MG PO TABS: 25.0000 mg | ORAL_TABLET | Freq: Every day | ORAL | 1 refills | Status: DC

## 2019-10-26 MED ORDER — AMLODIPINE BESYLATE 5 MG PO TABS: 5.0000 mg | ORAL_TABLET | Freq: Every day | ORAL | 3 refills | Status: DC

## 2019-10-26 NOTE — Progress Notes (Signed)
OFFICE VISIT CONVERTED TO TELEPHONE Note  This visit type was CONVERTED FROM AN IN OFFICE VISIT [place] UP TO A TELEPHONE VISIT  due to national recommendations for restrictions regarding the COVID-19 pandemic (e.g. social distancing).  Patient has developed low grade temp and chills post discharge.  This format is felt to be most appropriate for this patient at this time.  All issues noted in this document were discussed and addressed.  No physical exam was performed (except for noted visual exam findings with Video Visits).   I connected with@ on 10/26/19 at  3:30 PM EDT by  telephone and verified that I am speaking with the correct person using two identifiers. Location patient: home Location provider: work or home office Persons participating in the virtual visit: patient, provider  I discussed the limitations, risks, security and privacy concerns of performing an evaluation and management service by telephone and the availability of in person appointments. I also discussed with the patient that there may be a patient responsible charge related to this service. The patient expressed understanding and agreed to proceed.  Reason for visit: hospital follow up  HPI:   74 yr old female with history of eosinophilic esophagitis admitted to [facility] on March 8 with severe dysphagia resulting from  candida esophagitis from use of fluticasone , failing outpatient treatment despite 3 rounds of antifungals (Magic Mouthwash,  Nystatin and fluconazole),  Resulting in dehydration,  acute kidney injury and severe  Hyponatremia.  14 lb wt loss over 2 week period.   GI evaluation during admission included EGD, barium swallow and modifed barium swallow,  All were normal. .Candida esophagitis was not present.  She was prescribed protonix bid and reglan for dysmotility, discharged on March 12 in improved condition and advised  to follow up with Dr. Fuller Plan,  and continue a soft diet .  Testing for food  allergies was also advised   Eating soft foods thus far since discharge .  No swallowing issues    ROS: See pertinent positives and negatives per HPI.  Past Medical History:  Diagnosis Date  . Allergic rhinoconjunctivitis   . Allergy 1992  . Arthritis   . Cataract   . Diverticulosis   . Eosinophilic esophagitis   . GERD (gastroesophageal reflux disease)    PMH of esophageal stricture  . Hyperlipidemia   . Hypertension   . Osteopenia    BMD @[facility]   . Thyroid disease    hypothyroidism  . Transfusion history 1968   post partum  . Trapezius muscle spasm 04/13/2015  . Vitiligo     Past Surgical History:  Procedure Laterality Date  . ABDOMINAL HYSTERECTOMY     for dysfunctional menses; USO with TAH  . APPENDECTOMY    . BREAST EXCISIONAL BIOPSY Right 1972   NEG  . BREAST EXCISIONAL BIOPSY Left 1980'S   NEG  . BREAST SURGERY     biopsy X 2  . CHOLECYSTECTOMY N/A 04/30/2016   Procedure: LAPAROSCOPIC CHOLECYSTECTOMY;  Surgeon: Georganna Skeans, MD;  Location: [place];  Service: General;  Laterality: N/A;  . COLONOSCOPY     X2; Tics; Dr Fuller Plan, GI  . ESOPHAGEAL DILATION     X2  . ESOPHAGOGASTRODUODENOSCOPY (EGD) WITH PROPOFOL N/A 10/19/2019   Procedure: ESOPHAGOGASTRODUODENOSCOPY (EGD) WITH PROPOFOL;  Surgeon: Mauri Pole, MD;  Location: [place];  Service: Endoscopy;  Laterality: N/A;  . EYE SURGERY    . FRACTURE SURGERY  2003or 2004  . ROTATOR CUFF REPAIR  R shoulder  . TUBAL LIGATION     befor hysterectomy  . UPPER GASTROINTESTINAL ENDOSCOPY      Family History  Problem Relation Age of Onset  . Breast cancer Sister        mammograms @ [facility]  . Colon polyps Sister   . COPD Father   . Cancer Mother        breast&stomach  . Stomach cancer Mother   . Breast cancer Mother   . Colon cancer Maternal Aunt   . Cancer Maternal Aunt 80       colon cA  . Coronary artery disease Brother   . COPD Brother   . Colon polyps Brother   . COPD Son   . Diabetes Neg Hx    . Stroke Neg Hx   . Heart disease Neg Hx   . Esophageal cancer Neg Hx   . Rectal cancer Neg Hx     SOCIAL HX:  reports that she has never smoked. She has never used smokeless tobacco. She reports current alcohol use. She reports that she does not use drugs.   Current Outpatient Medications:  .  gabapentin (NEURONTIN) 100 MG capsule, TAKE 1 CAPSULE (100 MG TOTAL) BY MOUTH 3 (THREE) TIMES DAILY. EVERY 8 HOURS AS NEEDED (Patient taking differently: Take 100 mg by mouth 3 (three) times daily. ), Disp: 90 capsule, Rfl: 1 .  Lactulose 20 GM/30ML SOLN, Take 30 mLs (20 g total) by mouth daily as needed (for constipation)., Disp: 450 mL, Rfl: 0 .  levothyroxine (SYNTHROID) 75 MCG tablet, TAKE 1 TABLET (75 MCG TOTAL) BY MOUTH DAILY BEFORE BREAKFAST., Disp: 90 tablet, Rfl: 1 .  losartan (COZAAR) 100 MG tablet, TAKE 1 TABLET EVERY DAY (Patient taking differently: Take 100 mg by mouth daily. ), Disp: 90 tablet, Rfl: 1 .  metoCLOPramide (REGLAN) 5 MG tablet, Take 1 tablet (5 mg total) by mouth 3 (three) times daily before meals., Disp: 90 tablet, Rfl: 0 .  omeprazole (PRILOSEC) 40 MG capsule, TAKE 1 CAPSULE EVERY DAY (Patient taking differently: Take 40 mg by mouth daily. ), Disp: 90 capsule, Rfl: 3 .  Polyethyl Glycol-Propyl Glycol (SYSTANE OP), Place 1 drop into both eyes 4 (four) times daily as needed (dry eyes)., Disp: , Rfl:  .  polyethylene glycol (MIRALAX / GLYCOLAX) 17 g packet, Take 17 g by mouth daily., Disp: , Rfl:  .  simvastatin (ZOCOR) 20 MG tablet, TAKE 1 TABLET EVERY EVENING (Patient taking differently: Take 20 mg by mouth daily at 6 PM. ), Disp: 90 tablet, Rfl: 1 .  amLODipine (NORVASC) 5 MG tablet, Take 1 tablet (5 mg total) by mouth daily., Disp: 90 tablet, Rfl: 3 .  hydrochlorothiazide (HYDRODIURIL) 25 MG tablet, Take 1 tablet (25 mg total) by mouth daily., Disp: 90 tablet, Rfl: 1  EXAM:   General impression: alert, cooperative and articulate.  No signs of being in distress  Lungs:  speech is fluent sentence length suggests that patient is not short of breath and not punctuated by cough, sneezing or sniffing. Marland Kitchen   Psych: affect normal.  speech is articulate and non pressured .  Denies suicidal thoughts   ASSESSMENT AND PLAN:  Discussed the following assessment and plan:  Food allergy - Plan: Ambulatory referral to [facility] discharge follow-up  Esophageal dysphagia  Hospital discharge follow-up Patient is stable post discharge and has no new issues or questions about discharge plans at the visit today for hospital follow up. All labs , imaging studies  and progress notes from admission were reviewed with patient today    Esophageal dysphagia Secondary to candidae esophagitis, now resolved.  Continue soft diet, Protonix bid and reglan per GI    I discussed the assessment and treatment plan with the patient. The patient was provided an opportunity to ask questions and all were answered. The patient agreed with the plan and demonstrated an understanding of the instructions.   The patient was advised to call back or seek an in-person evaluation if the symptoms worsen or if the condition fails to improve as anticipated.  I provided 30 minutes of non-face-to-face time during this encounter.   [name], MD

## 2019-10-26 NOTE — Telephone Encounter (Signed)
Pt said she forgot to ask Dr. Derrel Nip if she is supposed to be taking Nystatin?

## 2019-10-26 NOTE — Telephone Encounter (Signed)
This medication was discontinued at discharge from hospital.

## 2019-10-26 NOTE — Telephone Encounter (Signed)
Medication is not on current list. Please advise. refill

## 2019-10-26 NOTE — Telephone Encounter (Signed)
She has resumed her bp meds since she is eating and drinking better,   I have sent the refill to mail order

## 2019-10-26 NOTE — Telephone Encounter (Signed)
[facility] pharmacy said pt is requesting a refill for hydrochlorothiazide 25mg . They said we need to send a new prescription for this. Pt has appt today 10/26/19 @ 3:30pm.

## 2019-10-26 NOTE — Telephone Encounter (Signed)
Pt wants to know if she should take a tylenol or ibprofen for her fever. She would like a call back.

## 2019-10-28 ENCOUNTER — Other Ambulatory Visit: Payer: Medicare HMO

## 2019-10-28 NOTE — Telephone Encounter (Signed)
Spoke with pt yesterday and she is was advised that she can take either for the fever. Pt gave a verbal understanding.

## 2019-10-28 NOTE — Assessment & Plan Note (Signed)
Patient is stable post discharge and has no new issues or questions about discharge plans at the visit today for hospital follow up. All labs , imaging studies and progress notes from admission were reviewed with patient today   

## 2019-10-28 NOTE — Assessment & Plan Note (Addendum)
Secondary to candidae esophagitis, now resolved.  Continue soft diet, Protonix bid and reglan per GI.  Referral to [facility] for food allergy testing in progress.

## 2019-11-03 ENCOUNTER — Telehealth: Payer: Self-pay | Admitting: Internal Medicine

## 2019-11-03 NOTE — Telephone Encounter (Signed)
Pt needs a refill on metoCLOPramide (REGLAN) 5 MG tablet until she sees Dr Fuller Plan on the 15th of April.

## 2019-11-04 MED ORDER — METOCLOPRAMIDE HCL 5 MG PO TABS: 5.0000 mg | ORAL_TABLET | Freq: Three times a day (TID) | ORAL | 0 refills | Status: DC

## 2019-11-25 ENCOUNTER — Encounter: Payer: Self-pay | Admitting: Gastroenterology

## 2019-11-25 ENCOUNTER — Other Ambulatory Visit: Payer: Self-pay

## 2019-11-25 ENCOUNTER — Ambulatory Visit: Payer: Medicare HMO | Admitting: Gastroenterology

## 2019-11-25 VITALS — BP 154/66 | HR 92 | Temp 98.3°F | Ht 62.0 in | Wt 130.4 lb

## 2019-11-25 DIAGNOSIS — R1310 Dysphagia, unspecified: Secondary | ICD-10-CM

## 2019-11-25 DIAGNOSIS — K117 Disturbances of salivary secretion: Secondary | ICD-10-CM | POA: Diagnosis not present

## 2019-11-25 DIAGNOSIS — K219 Gastro-esophageal reflux disease without esophagitis: Secondary | ICD-10-CM

## 2019-11-25 DIAGNOSIS — J029 Acute pharyngitis, unspecified: Secondary | ICD-10-CM

## 2019-11-25 MED ORDER — OMEPRAZOLE 40 MG PO CPDR: 40.0000 mg | DELAYED_RELEASE_CAPSULE | Freq: Two times a day (BID) | ORAL | 11 refills | Status: DC

## 2019-11-25 MED ORDER — SUCRALFATE 1 GM/10ML PO SUSP: 1.0000 g | Freq: Three times a day (TID) | ORAL | 1 refills | Status: DC

## 2019-11-25 NOTE — Patient Instructions (Addendum)
We have sent the following medications to your pharmacy for you to pick up at your convenience: omeprazole 40 mg twice daily. Open up capsule up and pour onto applesauce. Also, sent Carafate suspension to take three times a day.   You can take over the counter Gas-x or Gaviscon as needed for gas and bloating.  We have referred you to Dr. Rosina Lowenstein office with [facility]. They will contact you with an appointment date and time.   Thank you for choosing me and [facility].  Pricilla Riffle. Dagoberto Ligas., MD., Marval Regal

## 2019-11-25 NOTE — Progress Notes (Signed)
    History of Present Illness: This is a 74 year old female recently evaluated in the ED and then hospitalized for evaluation of dysphagia.  Evaluation below.  Patient states her symptoms have improved although she has difficulty swallowing some pills,occasional difficulty swallowing some foods, excessive salivation, sore throat, belching.  She has concerns about continuing on metoclopramide due to potential side effects.  Her appetite and oral intake have improved and she states a 2 pound weight gain.  EGD 10/19/2019 - Z-line regular, 37 cm from the incisors. Mild distal esophagitis - No endoscopic esophageal abnormality to explain patient's dysphagia. - Gastritis. - Normal examined duodenum.  Esophagram 10/20/2019 No mass or stricture. Distal esophageal dysmotility. Significant air within the esophagus with each swallow with patient belching throughout.  MBSS 10/20/2019 Patient presents with oropharyngeal swallow that is [facility]. During the study the pt complained of "something stuck in her throat," but her pharynx remained clear. Following bites of regular texture solids, pt was noted to have trace-mild esophageal backflow into the pharynx, but reduced as the pt utilized a liquid wash or took multiple swallows. As observed in the bedside swallow evaluation, pt also had significant belching following all trials. As far as an oropharyngeal standpoint, recommend thin liquids and up to regular texture solids, ensuring the pt sits upright >30 minutes following meals, but do recommend proceeding with esophagram   Current Medications, Allergies, Past Medical History, Past Surgical History, Family History and Social History were reviewed in [employer] record.   Physical Exam: General: Well developed, well nourished, no acute distress Head: Normocephalic and atraumatic Eyes:  sclerae anicteric, EOMI Ears: Normal auditory acuity Mouth: Not examined, mask on during Covid-19  pandemic Lungs: Clear throughout to auscultation Heart: Regular rate and rhythm; no murmurs, rubs or bruits Abdomen: Soft, non tender and non distended. No masses, hepatosplenomegaly or hernias noted. Normal Bowel sounds Rectal: Not done Musculoskeletal: Symmetrical with no gross deformities  Pulses:  Normal pulses noted Extremities: No clubbing, cyanosis, edema or deformities noted Neurological: Alert oriented x 4, grossly nonfocal Psychological:  Alert and cooperative. Anxious   Assessment and Recommendations:  1. GERD. EOE. Dysphagia. Esophageal dysmotility. Sore throat. Excess salivation.  Belching.  Increased eosinophils could be related to GERD and not EOE.  Intensify antireflux therapy.  Closely follow antireflux measures.  Increase omeprazole to 40 mg po bid ac, open capsule and sprinkle granules on applesauce. Trial of Carafate suspension 1g po tid prn. Change to smaller pills or liquid medications as needed.  Adjust food intake to foods that are easier to swallow.  Discontinue metoclopramide in 2 weeks.  Gas-X or Gaviscon 3 times daily as needed.  ENT referral.  REV 3 months.

## 2019-12-07 ENCOUNTER — Ambulatory Visit (INDEPENDENT_AMBULATORY_CARE_PROVIDER_SITE_OTHER): Payer: Medicare HMO | Admitting: Otolaryngology

## 2019-12-07 ENCOUNTER — Encounter (INDEPENDENT_AMBULATORY_CARE_PROVIDER_SITE_OTHER): Payer: Self-pay | Admitting: Otolaryngology

## 2019-12-07 ENCOUNTER — Other Ambulatory Visit: Payer: Self-pay

## 2019-12-07 VITALS — Temp 98.2°F

## 2019-12-07 DIAGNOSIS — R1312 Dysphagia, oropharyngeal phase: Secondary | ICD-10-CM

## 2019-12-07 DIAGNOSIS — R682 Dry mouth, unspecified: Secondary | ICD-10-CM

## 2019-12-07 NOTE — Progress Notes (Signed)
HPI: Crystal Kidd is a 74 y.o. female who presents is referred by Dr. Fuller Plan for evaluation of chronic throat complaints.  This apparently started several months ago when patient was using Flonase and developed a secondary thrush infection of her throat.  She was initially treated with Magic mouthwash and then nystatin.  She also describes a chronic dry throat.  At times she was having difficulty swallowing because of the abundance of mucus in her throat and the thick mucus making it difficult for her to swallow.. A little over a month ago she was seen in the ED because of difficulty swallowing and at one point had to be admitted because of difficulty swallowing. She has had upper endoscopy and apparently has been diagnosed with eosinophilic esophagitis. Her throat is gradually getting better.  She does not have a sore throat.  She also states that her swallowing is doing a little bit better but she still complains of slight dry throat. She uses eyedrops for dry eyes. She does have history of GE reflux disease and takes omeprazole.  Past Medical History:  Diagnosis Date  . Allergic rhinoconjunctivitis   . Allergy 1992  . Arthritis   . Cataract   . Diverticulosis   . Eosinophilic esophagitis   . GERD (gastroesophageal reflux disease)    PMH of esophageal stricture  . Hyperlipidemia   . Hypertension   . Osteopenia    BMD @[facility]   . Thyroid disease    hypothyroidism  . Transfusion history 1968   post partum  . Trapezius muscle spasm 04/13/2015  . Vitiligo    Past Surgical History:  Procedure Laterality Date  . ABDOMINAL HYSTERECTOMY     for dysfunctional menses; USO with TAH  . APPENDECTOMY    . BREAST EXCISIONAL BIOPSY Right 1972   NEG  . BREAST EXCISIONAL BIOPSY Left 1980'S   NEG  . BREAST SURGERY     biopsy X 2  . CHOLECYSTECTOMY N/A 04/30/2016   Procedure: LAPAROSCOPIC CHOLECYSTECTOMY;  Surgeon: Georganna Skeans, MD;  Location: [place];  Service: General;  Laterality: N/A;  .  COLONOSCOPY     X2; Tics; Dr Fuller Plan, GI  . ESOPHAGEAL DILATION     X2  . ESOPHAGOGASTRODUODENOSCOPY (EGD) WITH PROPOFOL N/A 10/19/2019   Procedure: ESOPHAGOGASTRODUODENOSCOPY (EGD) WITH PROPOFOL;  Surgeon: Mauri Pole, MD;  Location: [place];  Service: Endoscopy;  Laterality: N/A;  . EYE SURGERY    . FRACTURE SURGERY  2003or 2004  . ROTATOR CUFF REPAIR     R shoulder  . TUBAL LIGATION     befor hysterectomy  . UPPER GASTROINTESTINAL ENDOSCOPY     Social History   Socioeconomic History  . Marital status: Widowed    Spouse name: Not on file  . Number of children: Not on file  . Years of education: Not on file  . Highest education level: Not on file  Occupational History  . Not on file  Tobacco Use  . Smoking status: Never Smoker  . Smokeless tobacco: Never Used  Substance and Sexual Activity  . Alcohol use: Yes    Comment: have wine 1 glass every 6 months  . Drug use: No  . Sexual activity: Not Currently  Other Topics Concern  . Not on file  Social History Narrative  . Not on file   Social Determinants of Health   Financial Resource Strain:   . Difficulty of Paying Living Expenses:   Food Insecurity:   . Worried About Charity fundraiser  in the Last Year:   . [place] in the Last Year:   Transportation Needs:   . Film/video editor (Medical):   Marland Kitchen Lack of Transportation (Non-Medical):   Physical Activity:   . Days of Exercise per Week:   . Minutes of Exercise per Session:   Stress:   . Feeling of Stress :   Social Connections:   . Frequency of Communication with Friends and Family:   . Frequency of Social Gatherings with Friends and Family:   . Attends Religious Services:   . Active Member of Clubs or Organizations:   . Attends Archivist Meetings:   Marland Kitchen Marital Status:    Family History  Problem Relation Age of Onset  . Breast cancer Sister        mammograms @ [facility]  . Colon polyps Sister   . COPD Father   . Cancer Mother         breast&stomach  . Stomach cancer Mother   . Breast cancer Mother   . Colon cancer Maternal Aunt   . Cancer Maternal Aunt 80       colon cA  . Coronary artery disease Brother   . COPD Brother   . Colon polyps Brother   . COPD Son   . Diabetes Neg Hx   . Stroke Neg Hx   . Heart disease Neg Hx   . Esophageal cancer Neg Hx   . Rectal cancer Neg Hx    Allergies  Allergen Reactions  . Besivance [Besifloxacin Hcl] Shortness Of Breath and Other (See Comments)    Dizziness  . Alendronate Sodium Other (See Comments)    REACTION: intolerant ( PMH of esophageal stricture)  . Dilaudid [Hydromorphone Hcl] Itching    Nasal itching; resolution with Benadryl  . Codeine Nausea And Vomiting and Other (See Comments)    hallucinations  . Flonase [Fluticasone]     Mouth blisters  . Ibandronate Sodium Other (See Comments)     bone pain   Prior to Admission medications   Medication Sig Start Date End Date Taking? Authorizing Provider  amLODipine (NORVASC) 5 MG tablet Take 1 tablet (5 mg total) by mouth daily. 10/26/19  Yes [name], MD  gabapentin (NEURONTIN) 100 MG capsule TAKE 1 CAPSULE (100 MG TOTAL) BY MOUTH 3 (THREE) TIMES DAILY. EVERY 8 HOURS AS NEEDED Patient taking differently: Take 100 mg by mouth 3 (three) times daily.  04/22/19  Yes [name], MD  hydrochlorothiazide (HYDRODIURIL) 25 MG tablet Take 1 tablet (25 mg total) by mouth daily. 10/26/19  Yes [name], MD  Lactulose 20 GM/30ML SOLN Take 30 mLs (20 g total) by mouth daily as needed (for constipation). 10/22/19  Yes Donne Hazel, MD  levothyroxine (SYNTHROID) 75 MCG tablet TAKE 1 TABLET (75 MCG TOTAL) BY MOUTH DAILY BEFORE BREAKFAST. 08/11/19  Yes [name], MD  omeprazole (PRILOSEC) 40 MG capsule Take 1 capsule (40 mg total) by mouth 2 (two) times daily before a meal. 11/25/19  Yes Ladene Artist, MD  Polyethyl Glycol-Propyl Glycol (SYSTANE OP) Place 1 drop into both eyes 4 (four) times daily as  needed (dry eyes).   Yes [provider]  polyethylene glycol (MIRALAX / GLYCOLAX) 17 g packet Take 17 g by mouth daily.   Yes [provider]  simvastatin (ZOCOR) 20 MG tablet TAKE 1 TABLET EVERY EVENING Patient taking differently: Take 20 mg by mouth daily at 6 PM.  05/10/19  Yes [name], MD  sucralfate (CARAFATE) 1 GM/10ML suspension Take 10 mLs (1 g total) by mouth in the morning, at noon, and at bedtime. 11/25/19  Yes Ladene Artist, MD  metoCLOPramide (REGLAN) 5 MG tablet Take 1 tablet (5 mg total) by mouth 3 (three) times daily before meals. 11/04/19 12/04/19  [name], MD     Positive ROS: Otherwise negative  All other systems have been reviewed and were otherwise negative with the exception of those mentioned in the HPI and as above.  Physical Exam: Constitutional: Alert, well-appearing, no acute distress.  Voice is normal. Ears: External ears without lesions or tenderness. Ear canals are clear bilaterally with intact, clear TMs.  Nasal: External nose without lesions. Septum midline.. Clear nasal passages bilaterally with no signs of infection.  No difficulty breathing through her nose today. Oral: Lips and gums without lesions. Tongue and palate mucosa without lesions. Posterior oropharynx clear.  On examination of the mucosal membranes of her oropharynx there is no evidence of Candida or thrush.  Indirect laryngoscopy revealed clear base of tongue, vallecula, epiglottis and hypopharynx and larynx with normal mucosal membranes and no inflammation or thrush noted.  Piriform sinuses were clear bilaterally.  Patient is able to drink liquid or water without any difficulty. Neck: No palpable adenopathy or masses noted. Respiratory: Breathing comfortably  Skin: No facial/neck lesions or rash noted.  Procedures  Assessment: Chronic dry mouth.  Raises the question of possible Sjogren's disease. Presently no evidence of persistent Candida or thrush on upper  airway examination. Mild rhinitis with no signs of infection.  Plan: Patient states that overall she is doing much better. Suggested trying Biotene as this is beneficial in some people with dry mouth. For any nasal congestion recommended use of saline nasal irrigation instead of steroid nasal sprays. Also suggested possible use of some sugar-free candy that may promote salivation as well as drinking plenty liquids throughout the day.   Radene Journey, MD   CC:

## 2019-12-10 ENCOUNTER — Ambulatory Visit: Payer: Medicare HMO

## 2019-12-14 ENCOUNTER — Ambulatory Visit (INDEPENDENT_AMBULATORY_CARE_PROVIDER_SITE_OTHER): Payer: Medicare HMO

## 2019-12-14 ENCOUNTER — Other Ambulatory Visit: Payer: Self-pay

## 2019-12-14 VITALS — BP 131/70 | HR 71 | Ht 62.0 in | Wt 130.0 lb

## 2019-12-14 DIAGNOSIS — Z0000 Encounter for general adult medical examination without abnormal findings: Secondary | ICD-10-CM

## 2019-12-14 MED ORDER — AMLODIPINE BESYLATE 5 MG PO TABS: 5.0000 mg | ORAL_TABLET | Freq: Every day | ORAL | 3 refills | Status: DC

## 2019-12-14 NOTE — Patient Instructions (Addendum)
  Crystal Kidd , Thank you for taking time to come for your Medicare Wellness Visit. I appreciate your ongoing commitment to your health goals. Please review the following plan we discussed and let me know if I can assist you in the future.   These are the goals we discussed: Goals      Patient Stated   . Advanced Care Planning to complete  (pt-stated)       This is a list of the screening recommended for you and due dates:  Health Maintenance  Topic Date Due  . Colon Cancer Screening  12/25/2018  . Mammogram  01/27/2020  . Flu Shot  03/12/2020  . Tetanus Vaccine  05/11/2024  . DEXA scan (bone density measurement)  Completed  . COVID-19 Vaccine  Completed  .  Hepatitis C: One time screening is recommended by Center for Disease Control  (CDC) for  adults born from [birth year] through 1965.   Completed  . Pneumonia vaccines  Completed

## 2019-12-14 NOTE — Progress Notes (Addendum)
Subjective:   Crystal Kidd is a 74 y.o. female who presents for Medicare Annual (Subsequent) preventive examination.  Review of Systems:  No ROS.  Medicare Wellness Virtual Visit.  Visual/audio telehealth visit, UTA vital signs.   Ht/Wt provided. See social history for additional risk factors.   Cardiac Risk Factors include: advanced age (>23men, >3 women);hypertension     Objective:     Vitals: BP 131/70 (BP Location: Left Arm, Patient Position: Sitting, Cuff Size: Normal)   Pulse 71   Ht 5\' 2"  (1.575 m)   Wt 130 lb (59 kg)   BMI 23.78 kg/m   Body mass index is 23.78 kg/m.  Advanced Directives 12/14/2019 10/19/2019 10/17/2019 10/01/2019 12/09/2018 12/01/2017 11/29/2016  Does Patient Have a Medical Advance Directive? No Yes Yes No;Yes No Yes Yes  Type of Advance Directive - Healthcare Power of [place]  Does patient want to make changes to medical advance directive? Yes (MAU/Ambulatory/Procedural Areas - Information given) No - Patient declined - - - No - Patient declined No - Patient declined  Copy of Healthcare Power of Attorney in Chart? - No - copy requested No - copy requested, Physician notified No - copy requested - No - copy requested -  Would patient like information on creating a medical advance directive? - No - Patient declined No - Patient declined No - Patient declined No - Patient declined - -    Tobacco Social History   Tobacco Use  Smoking Status Never Smoker  Smokeless Tobacco Never Used     Counseling given: Not Answered   Clinical Intake:  Pre-visit preparation completed: Yes        Diabetes: No  How often do you need to have someone help you when you read instructions, pamphlets, or other written materials from your doctor or pharmacy?: 1 - Never  Interpreter Needed?: No     Past Medical History:  Diagnosis Date    . Allergic rhinoconjunctivitis   . Allergy 1992  . Arthritis   . Cataract   . Diverticulosis   . Eosinophilic esophagitis   . GERD (gastroesophageal reflux disease)    PMH of esophageal stricture  . Hyperlipidemia   . Hypertension   . Osteopenia    BMD @[facility]   . Thyroid disease    hypothyroidism  . Transfusion history 1968   post partum  . Trapezius muscle spasm 04/13/2015  . Vitiligo    Past Surgical History:  Procedure Laterality Date  . ABDOMINAL HYSTERECTOMY     for dysfunctional menses; USO with TAH  . APPENDECTOMY    . BREAST EXCISIONAL BIOPSY Right 1972   NEG  . BREAST EXCISIONAL BIOPSY Left 1980'S   NEG  . BREAST SURGERY     biopsy X 2  . CHOLECYSTECTOMY N/A 04/30/2016   Procedure: LAPAROSCOPIC CHOLECYSTECTOMY;  Surgeon: Georganna Skeans, MD;  Location: [place];  Service: General;  Laterality: N/A;  . COLONOSCOPY     X2; Tics; Dr Fuller Plan, GI  . ESOPHAGEAL DILATION     X2  . ESOPHAGOGASTRODUODENOSCOPY (EGD) WITH PROPOFOL N/A 10/19/2019   Procedure: ESOPHAGOGASTRODUODENOSCOPY (EGD) WITH PROPOFOL;  Surgeon: Mauri Pole, MD;  Location: [place];  Service: Endoscopy;  Laterality: N/A;  . EYE SURGERY    . FRACTURE SURGERY  2003or 2004  . ROTATOR CUFF REPAIR     R shoulder  . TUBAL LIGATION     befor  hysterectomy  . UPPER GASTROINTESTINAL ENDOSCOPY     Family History  Problem Relation Age of Onset  . Breast cancer Sister        mammograms @ [facility]  . Colon polyps Sister   . Emphysema Father   . Cancer Mother        breast&stomach  . Stomach cancer Mother   . Breast cancer Mother   . Colon cancer Maternal Aunt   . Cancer Maternal Aunt 80       colon cA  . Coronary artery disease Brother   . COPD Brother   . Colon polyps Brother   . Diabetes Neg Hx   . Stroke Neg Hx   . Heart disease Neg Hx   . Esophageal cancer Neg Hx   . Rectal cancer Neg Hx    Social History   Socioeconomic History  . Marital status: Widowed    Spouse name: Not on file  .  Number of children: Not on file  . Years of education: Not on file  . Highest education level: Not on file  Occupational History  . Not on file  Tobacco Use  . Smoking status: Never Smoker  . Smokeless tobacco: Never Used  Substance and Sexual Activity  . Alcohol use: Yes    Comment: have wine 1 glass every 6 months  . Drug use: No  . Sexual activity: Not Currently  Other Topics Concern  . Not on file  Social History Narrative  . Not on file   Social Determinants of Health   Financial Resource Strain:   . Difficulty of Paying Living Expenses:   Food Insecurity:   . Worried About Charity fundraiser in the Last Year:   . Arboriculturist in the Last Year:   Transportation Needs:   . Film/video editor (Medical):   Marland Kitchen Lack of Transportation (Non-Medical):   Physical Activity:   . Days of Exercise per Week:   . Minutes of Exercise per Session:   Stress:   . Feeling of Stress :   Social Connections:   . Frequency of Communication with Friends and Family:   . Frequency of Social Gatherings with Friends and Family:   . Attends Religious Services:   . Active Member of Clubs or Organizations:   . Attends Archivist Meetings:   Marland Kitchen Marital Status:     Outpatient Encounter Medications as of 12/14/2019  Medication Sig  . Calcium Carbonate Antacid (TUMS PO) Take 1 tablet by mouth.  . gabapentin (NEURONTIN) 100 MG capsule TAKE 1 CAPSULE (100 MG TOTAL) BY MOUTH 3 (THREE) TIMES DAILY. EVERY 8 HOURS AS NEEDED (Patient taking differently: Take 100 mg by mouth 3 (three) times daily. )  . hydrochlorothiazide (HYDRODIURIL) 25 MG tablet Take 1 tablet (25 mg total) by mouth daily.  Marland Kitchen levothyroxine (SYNTHROID) 75 MCG tablet TAKE 1 TABLET (75 MCG TOTAL) BY MOUTH DAILY BEFORE BREAKFAST.  Marland Kitchen omeprazole (PRILOSEC) 40 MG capsule Take 1 capsule (40 mg total) by mouth 2 (two) times daily before a meal.  . Polyethyl Glycol-Propyl Glycol (SYSTANE OP) Place 1 drop into both eyes 4 (four) times  daily as needed (dry eyes).  . polyethylene glycol (MIRALAX / GLYCOLAX) 17 g packet Take 17 g by mouth daily.  . simvastatin (ZOCOR) 20 MG tablet TAKE 1 TABLET EVERY EVENING (Patient taking differently: Take 20 mg by mouth daily at 6 PM. )  . sucralfate (CARAFATE) 1 GM/10ML suspension Take 10 mLs (1 g  total) by mouth in the morning, at noon, and at bedtime.  . [DISCONTINUED] amLODipine (NORVASC) 5 MG tablet Take 1 tablet (5 mg total) by mouth daily.  . [DISCONTINUED] Lactulose 20 GM/30ML SOLN Take 30 mLs (20 g total) by mouth daily as needed (for constipation).  . [DISCONTINUED] metoCLOPramide (REGLAN) 5 MG tablet Take 1 tablet (5 mg total) by mouth 3 (three) times daily before meals.   No facility-administered encounter medications on file as of 12/14/2019.    Activities of Daily Living In your present state of health, do you have any difficulty performing the following activities: 12/14/2019 10/19/2019  Hearing? N N  Vision? N N  Difficulty concentrating or making decisions? N N  Walking or climbing stairs? N N  Dressing or bathing? N N  Doing errands, shopping? N N  Preparing Food and eating ? N -  Using the Toilet? N -  In the past six months, have you accidently leaked urine? N -  Do you have problems with loss of bowel control? N -  Managing your Medications? N -  Managing your Finances? N -  Housekeeping or managing your Housekeeping? N -  Some recent data might be hidden    Patient Care Team: [name], MD as PCP - General (Internal Medicine)    Assessment:   This is a routine wellness examination for Crystal Kidd.   Nurse connected with patient 12/14/19 at  9:30 AM EDT by a telephone enabled telemedicine application, unable to complete as virtual and verified that I am speaking with the correct person using two identifiers. Patient stated full name and DOB. Patient gave permission to continue with virtual visit. Patient's location was at home and Nurse's location was at [place]  office.   Patient is alert and oriented x3. Patient denies difficulty focusing or concentrating.  Health Maintenance Due: -Colonoscopy- plans to schedule  See completed HM at the end of note.   Eye: Visual acuity not assessed. Virtual visit. Followed by their ophthalmologist.  Dental: Visits every 6 months.    Hearing: Demonstrates normal hearing during visit.  Safety:  Patient feels safe at home- yes Patient does have smoke detectors at home- yes Patient does wear sunscreen or protective clothing when in direct sunlight - yes Patient does wear seat belt when in a moving vehicle - yes Patient drives- yes Adequate lighting in walkways free from debris- yes Grab bars and handrails used as appropriate- yes Ambulates with an assistive device- no Cell phone on person when ambulating outside of the home-yes  Social: Alcohol intake - yes      Smoking history- never  Smokers in home? none Illicit drug use? none  Medication: Taking as directed and without issues. Taking pills better.  Requests amlodipine be sent to [facility] mail order instead of local pharmacy. Sent to cma for completion.  Self managed - yes  Patient plans to introduce hydrodiuril slowly, half dose, if blood pressure reaches 140 or higher; monitors blood pressure daily.   Covid-19: Precautions and sickness symptoms discussed. Wears mask, social distancing, hand hygiene as appropriate.   Activities of Daily Living Patient denies needing assistance with: household chores, feeding themselves, getting from bed to chair, getting to the toilet, bathing/showering, dressing, managing money, or preparing meals.   Discussed the importance of a healthy diet, water intake and the benefits of aerobic exercise.   Physical activity- walking, yard work  Diet:  Regular; small portions Premier protein 1 daily Water: good intake Caffeine: 1-2 cups of  coffee  Other Providers Patient Care Team: [name], MD as PCP -  General (Internal Medicine)  Exercise Activities and Dietary recommendations Current Exercise Habits: Home exercise routine, Type of exercise: walking  Goals      Patient Stated   . Advanced Care Planning to complete  (pt-stated)       Fall Risk Fall Risk  12/14/2019 10/26/2019 07/19/2019 12/09/2018 12/01/2017  Falls in the past year? 0 0 0 0 No  Follow up Falls evaluation completed Falls evaluation completed Falls evaluation completed - -   Timed Get Up and Go performed: no, virtual visit  Depression Screen PHQ 2/9 Scores 12/14/2019 12/09/2018 12/01/2017 11/29/2016  PHQ - 2 Score 0 0 0 0     Cognitive Function MMSE - Mini Mental State Exam 12/14/2019 12/01/2017 11/29/2016  Not completed: Unable to complete - -  Orientation to time - 5 5  Orientation to Place - 5 5  Registration - 3 3  Attention/ Calculation - 5 5  Recall - 3 3  Language- name 2 objects - 2 2  Language- repeat - 1 1  Language- follow 3 step command - 3 3  Language- read & follow direction - 1 1  Write a sentence - 1 1  Copy design - 1 1  Total score - 30 30     6CIT Screen 12/09/2018  What Year? 0 points  What month? 0 points  What time? 0 points  Count back from 20 0 points  Months in reverse 0 points  Repeat phrase 0 points  Total Score 0    Immunization History  Administered Date(s) Administered  . Fluad Quad(high Dose 65+) 04/26/2019  . H1N1 08/31/2008  . Influenza Split 05/22/2011, 04/29/2012  . Influenza Whole 08/12/2002, 05/05/2008, 05/16/2009, 05/21/2010  . Influenza, High Dose Seasonal PF 04/12/2013, 04/09/2016, 04/10/2017, 05/25/2018  . Influenza,inj,Quad PF,6+ Mos 04/20/2014, 05/02/2015  . PFIZER SARS-COV-2 Vaccination 09/01/2019, 09/20/2019  . Pneumococcal Conjugate-13 06/13/2014  . Pneumococcal Polysaccharide-23 06/21/2010, 11/30/2015  . Td 04/13/2004  . Tdap 05/11/2014  . Zoster 07/26/2009  . Zoster Recombinat (Shingrix) 06/30/2019   Screening Tests Health Maintenance  Topic Date Due   . COLONOSCOPY  12/25/2018  . MAMMOGRAM  01/27/2020  . INFLUENZA VACCINE  03/12/2020  . TETANUS/TDAP  05/11/2024  . DEXA SCAN  Completed  . COVID-19 Vaccine  Completed  . Hepatitis C Screening  Completed  . PNA vac Low Risk Adult  Completed      Plan:   Keep all routine maintenance appointments.   Follow up 01/31/20 @ 1030  Medicare Attestation I have personally reviewed: The patient's medical and social history Their use of alcohol, tobacco or illicit drugs Their current medications and supplements The patient's functional ability including ADLs,fall risks, home safety risks, cognitive, and hearing and visual impairment Diet and physical activities Evidence for depression   I have reviewed and discussed with patient certain preventive protocols, quality metrics, and best practice recommendations.     OBrien-Blaney, Sukari Grist L, LPN  [id number]     I have reviewed the above information and agree with above.   Deborra Medina, MD

## 2019-12-16 ENCOUNTER — Telehealth: Payer: Self-pay | Admitting: Gastroenterology

## 2019-12-16 NOTE — Telephone Encounter (Addendum)
Patient reports that she is having early satiety and some weight loss.  She will come in see Alonza Bogus, PA on 12/22/19

## 2019-12-16 NOTE — Telephone Encounter (Signed)
Patient called states she is recently having issues with her appetite. Said she eats and feels like she's full really fast. Has also lost 2 lbs seeking advise.

## 2019-12-20 ENCOUNTER — Telehealth: Payer: Self-pay | Admitting: Gastroenterology

## 2019-12-20 NOTE — Telephone Encounter (Signed)
The pt says that she is not sure if she should keep the appt she made for early satiety this week with Janett Billow.  She was hesitant to say that she is a lot better but has several questions.  She decided to keep the appt as planned.

## 2019-12-21 ENCOUNTER — Telehealth: Payer: Self-pay | Admitting: Gastroenterology

## 2019-12-21 NOTE — Telephone Encounter (Signed)
Patient states she has plenty of the Carafate liquid for the time being but her insurance no longer wants to cover the liquid. Informed patient when she is due for a refill to contact our office and we will send in the tablet form and she can make a slurry. Patient states that her pharmacy did tell her that one was covered. Patient states she will call and have [place] send in a tablet in the future.

## 2019-12-21 NOTE — Telephone Encounter (Signed)
Patient called states takes Carafate and the medication is expensive ins will not pay for it. She wants to know if this is something she is to continue and if so can she get an alternative.

## 2019-12-22 ENCOUNTER — Ambulatory Visit: Payer: Medicare HMO | Admitting: Gastroenterology

## 2019-12-27 ENCOUNTER — Telehealth: Payer: Self-pay | Admitting: Internal Medicine

## 2019-12-27 NOTE — Telephone Encounter (Signed)
Pt called that she is still having problem with throat  Swallowing.

## 2019-12-28 ENCOUNTER — Encounter: Payer: Self-pay | Admitting: Internal Medicine

## 2019-12-28 ENCOUNTER — Ambulatory Visit (INDEPENDENT_AMBULATORY_CARE_PROVIDER_SITE_OTHER): Payer: Medicare HMO | Admitting: Internal Medicine

## 2019-12-28 ENCOUNTER — Other Ambulatory Visit: Payer: Self-pay

## 2019-12-28 VITALS — BP 140/74 | HR 86 | Temp 97.6°F | Resp 15 | Ht 62.0 in | Wt 125.6 lb

## 2019-12-28 DIAGNOSIS — R682 Dry mouth, unspecified: Secondary | ICD-10-CM | POA: Diagnosis not present

## 2019-12-28 DIAGNOSIS — E441 Mild protein-calorie malnutrition: Secondary | ICD-10-CM

## 2019-12-28 DIAGNOSIS — R0989 Other specified symptoms and signs involving the circulatory and respiratory systems: Secondary | ICD-10-CM | POA: Diagnosis not present

## 2019-12-28 NOTE — Progress Notes (Signed)
Subjective:  Patient ID: Crystal Kidd, female    DOB: [birth year]  Age: 74 y.o. MRN: [id number]  CC: The primary encounter diagnosis was Dry mouth, unspecified. Diagnoses of Malnutrition of mild degree (HCC) and Globus sensation were also pertinent to this visit.  HPI Crystal Kidd presents for persistent globus sensation.   This visit occurred during the SARS-CoV-2 public health emergency.  Safety protocols were in place, including screening questions prior to the visit, additional usage of staff PPE, and extensive cleaning of exam room while observing appropriate contact time as indicated for disinfecting solutions.    Patient has received both doses of the available COVID 19 vaccine without complications.  Patient continues to mask when outside of the home except when walking in yard or at safe distances from others .  Patient denies any change in mood or development of unhealthy behaviors resuting from the pandemic's restriction of activities and socialization.    Hx:  Admitted to Crystal Kidd with dysphagia following candida esophagitis after beginning treatment for eosinophilc esophagitis.    Has had EGD,  swallow eval and ENT evaluation with only mild changes noted  EGD results from march 2021:  Z-line regular, 37 cm from the incisors. Mild distal esophagitis - No endoscopic esophageal abnormality to explain patient's dysphagia. - Gastritis. - Normal examined duodenum.  Swallow evaluation:  Patient presents with oropharyngeal swallow that is [facility]. During the study the pt complained of "something stuck in her throat," but her pharynx remained clear. Following bites of regular texture solids, pt was noted to have trace-mild esophageal backflow into the pharynx, but reduced as the pt utilized a liquid wash or took multiple swallows. As observed in the bedside swallow evaluation, pt also had significant belching following all trials. As far as an oropharyngeal standpoint, recommend thin liquids  and up to regular texture solids, ensuring the pt sits upright >30 minutes following meals, but do recommend proceeding with esophagram  Per ENT evaluation in march:  Chronic dry mouth.  Raises the question of possible Sjogren's disease. Presently no evidence of persistent Candida or thrush on upper airway examination. Mild rhinitis with no signs of infection.  Plan: Patient states that overall she is doing much better. Suggested trying Biotene as this is beneficial in some people with dry mouth. For any nasal congestion recommended use of saline nasal irrigation instead of steroid nasal sprays. Also suggested possible use of some sugar-free candy that may promote salivation as well as drinking plenty liquids throughout the day.   Patient states that up until one week ago symptoms had resolved as long as she avoided fried chicken ,  Hamburger  She had no symptoms .  Then last Wednesday after  Tried eating a hamburger patty she started having symptoms of food getting stuck on the right side,  Feels heavy on the right side.  Symptoms   Are present  for several hours before it will go down. Not choking.  Has been chewing orbit chewing gum,  Using biotene,  Throat lozenges.  Weight is stable.    Discussed referral to speech therapy .      Outpatient Medications Prior to Visit  Medication Sig Dispense Refill  . amLODipine (NORVASC) 5 MG tablet Take 1 tablet (5 mg total) by mouth daily. 90 tablet 3  . gabapentin (NEURONTIN) 100 MG capsule TAKE 1 CAPSULE (100 MG TOTAL) BY MOUTH 3 (THREE) TIMES DAILY. EVERY 8 HOURS AS NEEDED (Patient taking differently: Take 100 mg by mouth 3 (three)  times daily. ) 90 capsule 1  . hydrochlorothiazide (HYDRODIURIL) 25 MG tablet Take 1 tablet (25 mg total) by mouth daily. 90 tablet 1  . levothyroxine (SYNTHROID) 75 MCG tablet TAKE 1 TABLET (75 MCG TOTAL) BY MOUTH DAILY BEFORE BREAKFAST. 90 tablet 1  . omeprazole (PRILOSEC) 40 MG capsule Take 1 capsule (40 mg  total) by mouth 2 (two) times daily before a meal. 60 capsule 11  . Polyethyl Glycol-Propyl Glycol (SYSTANE OP) Place 1 drop into both eyes 4 (four) times daily as needed (dry eyes).    . polyethylene glycol (MIRALAX / GLYCOLAX) 17 g packet Take 17 g by mouth daily.    . Simethicone (GAS-X PO) Take 1 capsule by mouth daily.    . simvastatin (ZOCOR) 20 MG tablet TAKE 1 TABLET EVERY EVENING (Patient taking differently: Take 20 mg by mouth daily at 6 PM. ) 90 tablet 1  . SUCRALFATE PO Take by mouth.    . Calcium Carbonate Antacid (TUMS PO) Take 1 tablet by mouth.     No facility-administered medications prior to visit.    Review of Systems;  Patient denies headache, fevers, malaise, unintentional weight loss, skin rash, eye pain, sinus congestion and sinus pain, sore throat, dysphagia,  hemoptysis , cough, dyspnea, wheezing, chest pain, palpitations, orthopnea, edema, abdominal pain, nausea, melena, diarrhea, constipation, flank pain, dysuria, hematuria, urinary  Frequency, nocturia, numbness, tingling, seizures,  Focal weakness, Loss of consciousness,  Tremor, insomnia, depression, anxiety, and suicidal ideation.      Objective:  BP 140/74 (BP Location: Left Arm, Patient Position: Sitting, Cuff Size: Normal)   Pulse 86   Temp 97.6 F (36.4 C) (Temporal)   Resp 15   Ht 5\' 2"  (1.575 m)   Wt 125 lb 9.6 oz (57 kg)   SpO2 96%   BMI 22.97 kg/m   BP Readings from Last 3 Encounters:  12/28/19 140/74  12/14/19 131/70  11/25/19 (!) 154/66    Wt Readings from Last 3 Encounters:  12/28/19 125 lb 9.6 oz (57 kg)  12/14/19 130 lb (59 kg)  11/25/19 130 lb 6 oz (59.1 kg)    General appearance: alert, cooperative and appears stated age Ears: normal TM's and external ear canals both ears Throat: lips, mucosa, and tongue normal; teeth and gums normal Neck: no adenopathy, no carotid bruit, supple, symmetrical, trachea midline and thyroid not enlarged, symmetric, no tenderness/mass/nodules Back:  symmetric, no curvature. ROM normal. No CVA tenderness. Lungs: clear to auscultation bilaterally Heart: regular rate and rhythm, S1, S2 normal, no murmur, click, rub or gallop Abdomen: soft, non-tender; bowel sounds normal; no masses,  no organomegaly Pulses: 2+ and symmetric Skin: Skin color, texture, turgor normal. No rashes or lesions Lymph nodes: Cervical, supraclavicular, and axillary nodes normal.  Lab Results  Component Value Date   HGBA1C 5.5 06/20/2017   HGBA1C 5.6 08/25/2012   HGBA1C 5.6 05/05/2008    Lab Results  Component Value Date   CREATININE 0.99 10/22/2019   CREATININE 1.27 (H) 10/21/2019   CREATININE 1.73 (H) 10/20/2019    Lab Results  Component Value Date   WBC 5.9 10/21/2019   HGB 11.4 (L) 10/21/2019   HCT 34.2 (L) 10/21/2019   PLT 268 10/21/2019   GLUCOSE 86 10/22/2019   CHOL 139 06/24/2018   TRIG 80.0 06/24/2018   HDL 45.60 06/24/2018   LDLDIRECT 79.0 11/30/2015   LDLCALC 78 06/24/2018   ALT 15 10/22/2019   AST 18 10/22/2019   NA 140 10/22/2019   K 4.0  10/22/2019   CL 108 10/22/2019   CREATININE 0.99 10/22/2019   BUN 18 10/22/2019   CO2 22 10/22/2019   TSH 1.94 02/23/2019   HGBA1C 5.5 06/20/2017    No results found.  Assessment & Plan:   Problem List Items Addressed This Visit      Unprioritized   Globus sensation    Reviewed recent workup and diagnosis.  Symptoms started after developing candid esophagitis following treatment for eosinophilic esophagitis. Referral to speech therapy       Relevant Orders   Ambulatory referral to Speech Therapy    Other Visit Diagnoses    Dry mouth, unspecified    -  Primary   Relevant Orders   Sjogrens syndrome-A extractable nuclear antibody   Sjogrens syndrome-B extractable nuclear antibody   Malnutrition of mild degree ([place])       Relevant Orders   Comprehensive metabolic panel      I have discontinued Briyonna A. Devaul's Calcium Carbonate Antacid (TUMS PO). I am also having her maintain her  Polyethyl Glycol-Propyl Glycol (SYSTANE OP), gabapentin, simvastatin, polyethylene glycol, levothyroxine, hydrochlorothiazide, omeprazole, amLODipine, Simethicone (GAS-X PO), and SUCRALFATE PO.  No orders of the defined types were placed in this encounter.   Medications Discontinued During This Encounter  Medication Reason  . Calcium Carbonate Antacid (TUMS PO) Change in therapy    Follow-up: No follow-ups on file.   [name], MD

## 2019-12-28 NOTE — Telephone Encounter (Signed)
Pt has an appt scheduled for today to discuss with Dr. Derrel Nip.

## 2019-12-28 NOTE — Assessment & Plan Note (Signed)
Reviewed recent workup and diagnosis.  Symptoms started after developing candid esophagitis following treatment for eosinophilic esophagitis. Referral to speech therapy

## 2019-12-28 NOTE — Patient Instructions (Signed)
I have ordered the speech therapy referral  I am testing your for Sjogren's   I think you may have the following condition:   Globus Pharyngeus Globus pharyngeus is a condition that makes it feel like you have a lump in your throat. It may also feel like you have something stuck in the front of your throat. This feeling may come and go. It is not painful, and it does not make it harder to swallow food or liquid. Globus pharyngeus does not cause changes that a health care provider can see during a physical exam. This condition usually goes away without treatment. What are the causes? Often, no cause can be found. The most common cause of globus pharyngeus is a condition that causes stomach juices to flow back up into the throat (gastroesophageal reflux). Other possible causes include:  Overstimulation of nerves that control swallowing.  Irritation of nerves that control swallowing (neuralgia).  An enlarged gland in the lower neck (thyroid gland).  Growth of tonsil tissue at the base of the tongue (lingual tonsil).  Anxiety.  Depression. What are the signs or symptoms? The main symptom of this condition is a feeling of a lump in your throat. This feeling usually comes and goes. How is this diagnosed? This condition may be diagnosed after other conditions have been ruled out. You may have tests, such as:  A swallow study.  Ear, nose, and throat evaluation.  An exam of your throat using a thin, flexible tube with a light and camera on the end (endoscopy). How is this treated? This condition may go away on its own, without treatment. In some cases, antidepressant medicines may be helpful. Follow these instructions at home:   Follow instructions from your health care provider about eating or drinking restrictions.  Take over-the-counter and prescription medicines only as told by your health care provider.  Keep all follow-up visits as told by your health care provider. This is  important.  Follow instructions from your health care provider about home care for gastroesophageal reflux. Your health care provider may recommend that you: ? Do not eat or drink anything that causes heartburn. ? Do not eat heavy meals close to bedtime. ? Do not drink caffeine. ? Do not drink alcohol. ? Raise the head of your bed. ? Sleep on your left side. Contact a health care provider if:  Your symptoms get worse.  You have throat pain.  You have trouble swallowing.  Food or liquid comes back up into your mouth.  You lose weight without trying. Get help right away if:  You develop swelling in your throat. Summary  Globus pharyngeus is a condition that makes it feel like you have a lump in your throat.  This condition usually goes away without treatment. This information is not intended to replace advice given to you by your health care provider. Make sure you discuss any questions you have with your health care provider. Document Revised: 07/11/2017 Document Reviewed: 04/03/2016 Elsevier Patient Education  2020 [employer].

## 2019-12-29 LAB — SJOGRENS SYNDROME-B EXTRACTABLE NUCLEAR ANTIBODY: SSB (La) (ENA) Antibody, IgG: <1 AI

## 2019-12-29 LAB — COMPREHENSIVE METABOLIC PANEL
ALT: 11 U/L (ref 0–35)
AST: 19 U/L (ref 0–37)
Albumin: 4.5 g/dL (ref 3.5–5.2)
Alkaline Phosphatase: 98 U/L (ref 39–117)
BUN: 7 mg/dL (ref 6–23)
CO2: 29 mEq/L (ref 19–32)
Calcium: 9.9 mg/dL (ref 8.4–10.5)
Chloride: 105 mEq/L (ref 96–112)
Creatinine, Ser: 0.85 mg/dL (ref 0.40–1.20)
GFR: 65.28 mL/min (ref >60.00)
Glucose, Bld: 108 mg/dL — ABNORMAL HIGH (ref 70–99)
Potassium: 4.2 mEq/L (ref 3.5–5.1)
Sodium: 141 mEq/L (ref 135–145)
Total Bilirubin: 0.4 mg/dL (ref 0.2–1.2)
Total Protein: 6.9 g/dL (ref 6.0–8.3)

## 2019-12-29 LAB — SJOGRENS SYNDROME-A EXTRACTABLE NUCLEAR ANTIBODY: SSA (Ro) (ENA) Antibody, IgG: <1 AI

## 2019-12-30 DIAGNOSIS — K200 Eosinophilic esophagitis: Secondary | ICD-10-CM | POA: Diagnosis not present

## 2019-12-30 DIAGNOSIS — J300 Vasomotor rhinitis: Secondary | ICD-10-CM | POA: Diagnosis not present

## 2019-12-30 DIAGNOSIS — J309 Allergic rhinitis, unspecified: Secondary | ICD-10-CM | POA: Diagnosis not present

## 2020-01-03 ENCOUNTER — Other Ambulatory Visit: Payer: Self-pay | Admitting: Internal Medicine

## 2020-01-03 DIAGNOSIS — Z1231 Encounter for screening mammogram for malignant neoplasm of breast: Secondary | ICD-10-CM

## 2020-01-07 ENCOUNTER — Telehealth: Payer: Self-pay | Admitting: Gastroenterology

## 2020-01-07 MED ORDER — SUCRALFATE 1 G PO TABS
1.0000 g | ORAL_TABLET | Freq: Three times a day (TID) | ORAL | 1 refills | Status: DC | PRN
Start: 2020-01-07 — End: 2020-02-15

## 2020-01-07 NOTE — Telephone Encounter (Signed)
Prescription sent to patient's pharmacy.

## 2020-01-13 ENCOUNTER — Telehealth: Payer: Self-pay | Admitting: Internal Medicine

## 2020-01-13 NOTE — Telephone Encounter (Signed)
Patient called about the status of her speech therapy referral. Patient was informed that the referral was put in on 12/28/2019 and in the process of being scheduled.

## 2020-01-15 ENCOUNTER — Other Ambulatory Visit: Payer: Self-pay | Admitting: Gastroenterology

## 2020-01-27 ENCOUNTER — Encounter: Payer: Self-pay | Admitting: Speech Pathology

## 2020-01-27 ENCOUNTER — Other Ambulatory Visit: Payer: Self-pay

## 2020-01-27 ENCOUNTER — Ambulatory Visit: Payer: Medicare HMO | Attending: Internal Medicine | Admitting: Speech Pathology

## 2020-01-27 DIAGNOSIS — R1310 Dysphagia, unspecified: Secondary | ICD-10-CM | POA: Diagnosis not present

## 2020-01-27 NOTE — Therapy (Signed)
[place] MAIN [facility] SERVICES [address] [place], [place], [address] Phone: [phone]   Fax:  [phone]  Speech Language Pathology Evaluation  Patient Details  Name: Crystal Kidd MRN: [id number] Date of Birth: 27-May-1946 Referring Provider (SLP): Deborra Medina   Encounter Date: 01/27/2020   End of Session - 01/27/20 1617    SLP Start Time 1400    SLP Stop Time  1500    SLP Time Calculation (min) 60 min    Activity Tolerance Patient tolerated treatment well           Past Medical History:  Diagnosis Date  . Allergic rhinoconjunctivitis   . Allergy 1992  . Arthritis   . Cataract   . Diverticulosis   . Eosinophilic esophagitis   . GERD (gastroesophageal reflux disease)    PMH of esophageal stricture  . Hyperlipidemia   . Hypertension   . Osteopenia    BMD @[place]   . Thyroid disease    hypothyroidism  . Transfusion history 1968   post partum  . Trapezius muscle spasm 04/13/2015  . Vitiligo     Past Surgical History:  Procedure Laterality Date  . ABDOMINAL HYSTERECTOMY     for dysfunctional menses; USO with TAH  . APPENDECTOMY    . BREAST EXCISIONAL BIOPSY Right 1972   NEG  . BREAST EXCISIONAL BIOPSY Left 1980'S   NEG  . BREAST SURGERY     biopsy X 2  . CHOLECYSTECTOMY N/A 04/30/2016   Procedure: LAPAROSCOPIC CHOLECYSTECTOMY;  Surgeon: Georganna Skeans, MD;  Location: [place];  Service: General;  Laterality: N/A;  . COLONOSCOPY     X2; Tics; Dr Fuller Plan, GI  . ESOPHAGEAL DILATION     X2  . ESOPHAGOGASTRODUODENOSCOPY (EGD) WITH PROPOFOL N/A 10/19/2019   Procedure: ESOPHAGOGASTRODUODENOSCOPY (EGD) WITH PROPOFOL;  Surgeon: Mauri Pole, MD;  Location: [place];  Service: Endoscopy;  Laterality: N/A;  . EYE SURGERY    . FRACTURE SURGERY  2003or 2004  . ROTATOR CUFF REPAIR     R shoulder  . TUBAL LIGATION     befor hysterectomy  . UPPER GASTROINTESTINAL ENDOSCOPY      There were no vitals filed for this  visit.   Subjective Assessment - 01/27/20 1605    Subjective pt pleasant, talkative, interactive    Currently in Pain? No/denies              SLP Evaluation [facility] - 01/27/20 1605      SLP Visit Information   SLP Received On 01/27/20    Referring Provider (SLP) Deborra Medina    Onset Date 12/28/2019    Medical Diagnosis esophageal dysphagia      Subjective   Subjective pt pleasant, conversational    Patient/Family Stated Goal to figure out why pt has globus sensation      General Information   HPI Crystal SWEEZY presents for persistent globus sensation.  Admitted to [facility] (10/18/2019) with dysphagia following candida esophagitis after beginning treatment for eosinophilc esophagitis. Had EGD,  modified barium swallow study and ENT evaluation with only mild changes noted.  Pt with recent diagnosis of dry mouth.     Behavioral/Cognition alert and oriented x 4    Mobility Status ambulatory      Balance Screen   Has the patient fallen in the past 6 months No    Has the patient had a decrease in activity level because of a fear of falling?  No    Is the  patient reluctant to leave their home because of a fear of falling?  No      Oral Motor/Sensory Function   Overall Oral Motor/Sensory Function Appears within functional limits for tasks assessed    Overall Oral Motor/Sensory Function no deficits identified      Motor Speech   Overall Motor Speech Appears within functional limits for tasks assessed           Clinical Swallow Study - 01/27/20 1605      Baseline Assessment   Temperature Spikes Noted No    Respiratory Status Room air    History of Recent Intubation No    Behavior/Cognition Alert;Cooperative;Pleasant mood    Oral Cavity - Dentition Adequate natural dentition    Vision Functional for self-feeding    Patient Positioning Upright in chair    Baseline Vocal Quality Clear    Volitional Cough Strong    Volitional Swallow Able to elicit      Consistencies Assessed    Consistencies Assessed Yes      Thin Liquid   Thin Liquid Within functional limits    Presentation Cup;Self Fed      Solid   Solid Within functional limits    Presentation Self Fed      Aspiration Risk   Risk for Aspiration None    Recommendations --   follow up with PCP   Diet Solids Recommendation General consistency    Diet Liquids Recommendations No liquid consistency restrictions    Compensatory Swallowing Strategies Upright as possible for all oral intake;Remain upright for 20-30 minutes after meals;Alternate solids and liquids    Recommended Form of Medications Whole           SLP Education - 01/27/20 1617    Education Details oropharyngeal dysphagia and aspiration risks    Person(s) Educated Patient    Methods Explanation;Demonstration    Comprehension Verbalized understanding             Plan - 01/27/20 1618    Clinical Impression Statement Pt requested consult for questions related to her chronic globus sensation. Pt has significant history of esophageal issues with diagnosis of EoE and recently dry mouth. Oropharyngeal abilities were assessed with trials of regular textures and thin liquids. Pt was free of any overt s/s of oropharyngeal dysphagia and aspiration. Pt describes history of: trouble swallowing solid foods (esophageal dysphagia), food impaction x 2, Globus--the sensation of having a lump in her throat that isn't always linked to PO intake, pain with swallowing, chest discomfort, N/V and heartburn.  All of these reported symptoms correlate with diagnosis of EoE. From pt's questions it appears that she doesn't understand that EoE is a chronic disease of the esophagus that includes the above sensations. Clinically these symptoms appear compounded by pt's dry mouth. For example, pt reports having a dry mouth in the evaluation but continues to attempt to "swallow saliva" that isn't present d/t her dry mouth. So she appeared to swallow nothing but air which led to  confusion as to why she started belching. Recommend follow up with PCP or GI for extensive education on EoE, discuss possibility of Food Patch Testing and Empiric Elimination Diets.    Consulted and Agree with Plan of Care Patient           Patient will benefit from skilled therapeutic intervention in order to improve the following deficits and impairments:   Dysphagia, unspecified type    Problem List Patient Active Problem List   Diagnosis Date Noted  .  Globus sensation 12/28/2019  . Dysphagia 10/19/2019  . Hyponatremia 10/19/2019  . AKI (acute kidney injury) ([place])   . Oropharyngeal dysphagia 10/14/2019  . Personal history of colonic polyps 07/20/2019  . Sinus congestion 07/19/2019  . Tubular adenoma of colon 02/24/2019  . History of vertigo 06/26/2018  . Family history of breast cancer in mother 12/21/2016  . S/P laparoscopic cholecystectomy 06/04/2016  . Hospital discharge follow-up 05/15/2016  . Thoracic aortic atherosclerosis ([place]) 01/02/2016  . Encounter for preventive health examination 06/14/2014  . Pain in joint, shoulder region 12/06/2013  . S/P hysterectomy with oophorectomy 12/06/2013  . Cataract 12/06/2013  . Transfusion history   . Cervical disc disorder with radiculopathy of cervical region 05/21/2010  . Esophageal dysphagia 05/31/2009  . VITAMIN D DEFICIENCY 05/16/2009  . Hypothyroidism 12/26/2007  . Hyperlipidemia LDL goal <100 12/26/2007  . Essential hypertension 12/26/2007  . GERD 12/26/2007  . Diverticulosis of large intestine 12/26/2007  . VITILIGO 12/31/2006  . OSTEOPENIA 12/31/2006   Brylyn Novakovich B. Rutherford Nail M.S., CCC-SLP, [place] Pathologist Rehabilitation Services Office [phone]  Stormy Fabian 01/27/2020, 4:43 PM  [place] MAIN [facility] SERVICES [address] [place], [place], [address] Phone: [phone]   Fax:  [phone]  Name: JAKYLA REZA MRN: [id number] Date of Birth:  [birth year]

## 2020-01-28 ENCOUNTER — Ambulatory Visit
Admission: RE | Admit: 2020-01-28 | Discharge: 2020-01-28 | Disposition: A | Discharge status: Home / Self Care | Payer: Medicare HMO | Source: Ambulatory Visit | Attending: Internal Medicine | Admitting: Internal Medicine

## 2020-01-28 DIAGNOSIS — Z1231 Encounter for screening mammogram for malignant neoplasm of breast: Secondary | ICD-10-CM | POA: Diagnosis not present

## 2020-01-31 ENCOUNTER — Other Ambulatory Visit: Payer: Self-pay

## 2020-01-31 ENCOUNTER — Encounter: Payer: Self-pay | Admitting: Internal Medicine

## 2020-01-31 ENCOUNTER — Ambulatory Visit (INDEPENDENT_AMBULATORY_CARE_PROVIDER_SITE_OTHER): Payer: Medicare HMO | Admitting: Internal Medicine

## 2020-01-31 VITALS — BP 138/66 | HR 82 | Temp 96.8°F | Resp 15 | Ht 62.0 in | Wt 124.6 lb

## 2020-01-31 DIAGNOSIS — Z780 Asymptomatic menopausal state: Secondary | ICD-10-CM

## 2020-01-31 DIAGNOSIS — I700 Atherosclerosis of aorta: Secondary | ICD-10-CM | POA: Diagnosis not present

## 2020-01-31 DIAGNOSIS — R634 Abnormal weight loss: Secondary | ICD-10-CM | POA: Diagnosis not present

## 2020-01-31 DIAGNOSIS — R0989 Other specified symptoms and signs involving the circulatory and respiratory systems: Secondary | ICD-10-CM

## 2020-01-31 DIAGNOSIS — E785 Hyperlipidemia, unspecified: Secondary | ICD-10-CM | POA: Diagnosis not present

## 2020-01-31 DIAGNOSIS — E034 Atrophy of thyroid (acquired): Secondary | ICD-10-CM | POA: Diagnosis not present

## 2020-01-31 DIAGNOSIS — M810 Age-related osteoporosis without current pathological fracture: Secondary | ICD-10-CM

## 2020-01-31 DIAGNOSIS — E871 Hypo-osmolality and hyponatremia: Secondary | ICD-10-CM

## 2020-01-31 MED ORDER — HYOSCYAMINE SULFATE 0.125 MG PO TBDP: ORAL_TABLET | ORAL | 0 refills | Status: DC

## 2020-01-31 NOTE — Patient Instructions (Addendum)
   I am recommending that you stay on the carafate until you see Dr Fuller Plan.    Add antispasmodic  Called  Hyoscyamine.  You can   Dissolve under tongue before each meal It may require Prior Authorization    STOP THE BENADRYL, you can add an evening dose of claritin (loratidine) if you need to.  I have ordered  A bone density  Test since your last one was in 2017.  You may have progressed from osteopenia to osteoporosis and if so we will start treatment  I recommend getting the half of your calcium and Vitamin D  Requirements through dietary sources rather than supplements given the recent association of calcium supplements  with increased coronary artery calcium scores (You need 1200 mg daily )   YOU Can resume taking 2 tums daily for the calcium   Unsweetened almond/coconut milk, Cashew and soy  Milks are all great low calorie low carb, cholesterol free sources of dietary calcium and vitamin D.    Return in one month for fasting labs .  I'll see you in 6

## 2020-01-31 NOTE — Progress Notes (Signed)
Subjective:  Patient ID: Crystal Kidd, female    DOB: 1945/09/06  Age: 74 y.o. MRN: [id number]  CC: The primary encounter diagnosis was Postmenopausal estrogen deficiency. Diagnoses of Hyperlipidemia LDL goal <100, Hyponatremia, Hypothyroidism due to acquired atrophy of thyroid, Weight loss, non-intentional, Globus sensation, Post-menopausal osteoporosis, and Thoracic aortic atherosclerosis ([place]) were also pertinent to this visit.  HPI Crystal Kidd presents for follow up  This visit occurred during the SARS-CoV-2 public health emergency.  Safety protocols were in place, including screening questions prior to the visit, additional usage of staff PPE, and extensive cleaning of exam room while observing appropriate contact time as indicated for disinfecting solutions.   Saw speech pathology for globus sensation following an episode of candida esophagitis secondary to use of inhaled steroids for management of eosinophilic esophagitis. .  Advised to follow up with PCP or GI to discuss eosinophilic esophagitis and consider elimination diet . Has already seen allergist ,  Is taking claritin and omeprazole twice daily .  Taking sucralfate in crushed form  Per GI.  Symptoms are now intermittent.   Osteoporosis without fractures:  New diagnosis.  Options of treatment discussed .  bisphosponates C/I due to esophageal issues.  Evista recommended given FH of BRCA and no personal history of CA or acquired thrombophilia/DVT  HTN:  Patient is taking her medications as prescribed and notes no adverse effects.  Home BP readings have been done about once per week and are  generally < 130/80 .  She is avoiding added salt in her diet and walking regularly about 3 times per week for exercise  .  Outpatient Medications Prior to Visit  Medication Sig Dispense Refill  . amLODipine (NORVASC) 5 MG tablet Take 1 tablet (5 mg total) by mouth daily. 90 tablet 3  . azelastine (ASTELIN) 0.1 % nasal spray     . gabapentin  (NEURONTIN) 100 MG capsule TAKE 1 CAPSULE (100 MG TOTAL) BY MOUTH 3 (THREE) TIMES DAILY. EVERY 8 HOURS AS NEEDED (Patient taking differently: Take 100 mg by mouth 3 (three) times daily. ) 90 capsule 1  . hydrochlorothiazide (HYDRODIURIL) 25 MG tablet Take 1 tablet (25 mg total) by mouth daily. 90 tablet 1  . levothyroxine (SYNTHROID) 75 MCG tablet TAKE 1 TABLET (75 MCG TOTAL) BY MOUTH DAILY BEFORE BREAKFAST. 90 tablet 1  . omeprazole (PRILOSEC) 40 MG capsule Take 1 capsule (40 mg total) by mouth 2 (two) times daily before a meal. 60 capsule 11  . Polyethyl Glycol-Propyl Glycol (SYSTANE OP) Place 1 drop into both eyes 4 (four) times daily as needed (dry eyes).    . polyethylene glycol (MIRALAX / GLYCOLAX) 17 g packet Take 17 g by mouth daily.    . Simethicone (GAS-X PO) Take 1 capsule by mouth daily.    . simvastatin (ZOCOR) 20 MG tablet TAKE 1 TABLET EVERY EVENING (Patient taking differently: Take 20 mg by mouth daily at 6 PM. ) 90 tablet 1  . sucralfate (CARAFATE) 1 g tablet Take 1 tablet (1 g total) by mouth 3 (three) times daily as needed. 90 tablet 1  . FLOVENT HFA 220 MCG/ACT inhaler SWALLOW ( NOT INHALE) TWO PUFFS TWICE A DAY 12 Inhaler 0   No facility-administered medications prior to visit.    Review of Systems;  Patient denies headache, fevers, malaise, unintentional weight loss, skin rash, eye pain, sinus congestion and sinus pain, sore throat, dysphagia,  hemoptysis , cough, dyspnea, wheezing, chest pain, palpitations, orthopnea, edema, abdominal pain, nausea, melena,  diarrhea, constipation, flank pain, dysuria, hematuria, urinary  Frequency, nocturia, numbness, tingling, seizures,  Focal weakness, Loss of consciousness,  Tremor, insomnia, depression, anxiety, and suicidal ideation.      Objective:  BP 138/66 (BP Location: Left Arm, Patient Position: Sitting, Cuff Size: Normal)   Pulse 82   Temp (!) 96.8 F (36 C) (Temporal)   Resp 15   Ht 5' 2" (1.575 m)   Wt 124 lb 9.6 oz  (56.5 kg)   SpO2 96%   BMI 22.79 kg/m   BP Readings from Last 3 Encounters:  01/31/20 138/66  12/28/19 140/74  12/14/19 131/70    Wt Readings from Last 3 Encounters:  01/31/20 124 lb 9.6 oz (56.5 kg)  12/28/19 125 lb 9.6 oz (57 kg)  12/14/19 130 lb (59 kg)    General appearance: alert, cooperative and appears stated age Ears: normal TM's and external ear canals both ears Throat: lips, mucosa, and tongue normal; teeth and gums normal Neck: no adenopathy, no carotid bruit, supple, symmetrical, trachea midline and thyroid not enlarged, symmetric, no tenderness/mass/nodules Back: symmetric, no curvature. ROM normal. No CVA tenderness. Lungs: clear to auscultation bilaterally Heart: regular rate and rhythm, S1, S2 normal, no murmur, click, rub or gallop Abdomen: soft, non-tender; bowel sounds normal; no masses,  no organomegaly Pulses: 2+ and symmetric Skin: Skin color, texture, turgor normal. No rashes or lesions Lymph nodes: Cervical, supraclavicular, and axillary nodes normal.  Lab Results  Component Value Date   HGBA1C 5.5 06/20/2017   HGBA1C 5.6 08/25/2012   HGBA1C 5.6 05/05/2008    Lab Results  Component Value Date   CREATININE 0.85 12/28/2019   CREATININE 0.99 10/22/2019   CREATININE 1.27 (H) 10/21/2019    Lab Results  Component Value Date   WBC 5.9 10/21/2019   HGB 11.4 (L) 10/21/2019   HCT 34.2 (L) 10/21/2019   PLT 268 10/21/2019   GLUCOSE 108 (H) 12/28/2019   CHOL 139 06/24/2018   TRIG 80.0 06/24/2018   HDL 45.60 06/24/2018   LDLDIRECT 79.0 11/30/2015   LDLCALC 78 06/24/2018   ALT 11 12/28/2019   AST 19 12/28/2019   NA 141 12/28/2019   K 4.2 12/28/2019   CL 105 12/28/2019   CREATININE 0.85 12/28/2019   BUN 7 12/28/2019   CO2 29 12/28/2019   TSH 1.94 02/23/2019   HGBA1C 5.5 06/20/2017    MM 3D SCREEN BREAST BILATERAL  Result Date: 01/28/2020 CLINICAL DATA:  Screening. EXAM: DIGITAL SCREENING BILATERAL MAMMOGRAM WITH TOMO AND CAD COMPARISON:   Previous exam(s). ACR Breast Density Category c: The breast tissue is heterogeneously dense, which may obscure small masses. FINDINGS: There are no findings suspicious for malignancy. Images were processed with CAD. IMPRESSION: No mammographic evidence of malignancy. A result letter of this screening mammogram will be mailed directly to the patient. RECOMMENDATION: Screening mammogram in one year. (Code:SM-B-01Y) BI-RADS CATEGORY  1: Negative. Electronically Signed   By: Ammie Ferrier M.D.   On: 01/28/2020 16:24    Assessment & Plan:   Problem List Items Addressed This Visit      Unprioritized   Globus sensation    Secondary to EoE.  Patient trying alternative treatments (dietary changes, PPI and antihistamines and sucralfate) with improved symptoms .  Adding levsin for management of spasm described by patient       Hyperlipidemia LDL goal <100   Relevant Orders   Lipid panel   Hyponatremia   Relevant Orders   Comprehensive metabolic panel   Hypothyroidism  Relevant Orders   TSH   Post-menopausal osteoporosis    Risks and benefits of treatment options discussed.  Discussed treatment options,  Calcium and Vit d requirements.  Evista  advised given her age , FH of BRCA, and contraindication to fosomax given her dysphagia.       Thoracic aortic atherosclerosis ([place])    She is taking statin for primary prevention:  LDL and triglycerides were at goal over one year ago and are due for repeat assessment.  She  has no side effects and liver enzymes are normal. No changes today. Lab Results  Component Value Date   CHOL 139 06/24/2018   HDL 45.60 06/24/2018   LDLCALC 78 06/24/2018   LDLDIRECT 79.0 11/30/2015   TRIG 80.0 06/24/2018   CHOLHDL 3 06/24/2018   Lab Results  Component Value Date   ALT 11 12/28/2019   AST 19 12/28/2019   ALKPHOS 98 12/28/2019   BILITOT 0.4 12/28/2019          Other Visit Diagnoses    Postmenopausal estrogen deficiency    -  Primary   Relevant  Orders   DG Bone Density   VITAMIN D 25 Hydroxy (Vit-D Deficiency, Fractures)   Weight loss, non-intentional       Relevant Orders   CBC with Differential/Platelet    A total of 40 minutes was spent with patient more than half of which was spent in counseling patient on the above mentioned issues , reviewing and explaining recent labs and imaging studies done, and coordination of care.  I have discontinued Letta Median A. Egelhoff's Flovent HFA. I am also having her start on hyoscyamine. Additionally, I am having her maintain her Polyethyl Glycol-Propyl Glycol (SYSTANE OP), gabapentin, simvastatin, polyethylene glycol, levothyroxine, hydrochlorothiazide, omeprazole, amLODipine, Simethicone (GAS-X PO), sucralfate, and azelastine.  Meds ordered this encounter  Medications  . hyoscyamine (ANASPAZ) 0.125 MG TBDP disintergrating tablet    Sig: Dissolve under tongue before each meal    Dispense:  90 tablet    Refill:  0    Medications Discontinued During This Encounter  Medication Reason  . FLOVENT HFA 220 MCG/ACT inhaler     Follow-up: Return in about 6 months (around 08/01/2020).   [name], MD

## 2020-02-01 ENCOUNTER — Ambulatory Visit: Payer: Medicare HMO | Admitting: Speech Pathology

## 2020-02-01 NOTE — Assessment & Plan Note (Signed)
Risks and benefits of treatment options discussed.  Discussed treatment options,  Calcium and Vit d requirements.  Evista  advised given her age , FH of BRCA, and contraindication to fosomax given her dysphagia.

## 2020-02-01 NOTE — Assessment & Plan Note (Signed)
She is taking statin for primary prevention:  LDL and triglycerides were at goal over one year ago and are due for repeat assessment.  She  has no side effects and liver enzymes are normal. No changes today. Lab Results  Component Value Date   CHOL 139 06/24/2018   HDL 45.60 06/24/2018   LDLCALC 78 06/24/2018   LDLDIRECT 79.0 11/30/2015   TRIG 80.0 06/24/2018   CHOLHDL 3 06/24/2018   Lab Results  Component Value Date   ALT 11 12/28/2019   AST 19 12/28/2019   ALKPHOS 98 12/28/2019   BILITOT 0.4 12/28/2019

## 2020-02-01 NOTE — Assessment & Plan Note (Addendum)
Secondary to EoE.  Patient trying alternative treatments (dietary changes, PPI and antihistamines and sucralfate) with improved symptoms .  Adding levsin for management of spasm described by patient

## 2020-02-03 ENCOUNTER — Ambulatory Visit: Payer: Medicare HMO | Admitting: Speech Pathology

## 2020-02-04 ENCOUNTER — Telehealth: Payer: Self-pay | Admitting: Internal Medicine

## 2020-02-04 ENCOUNTER — Other Ambulatory Visit: Payer: Self-pay

## 2020-02-04 MED ORDER — SIMVASTATIN 20 MG PO TABS: 20.0000 mg | ORAL_TABLET | Freq: Every evening | ORAL | 3 refills | Status: DC

## 2020-02-04 NOTE — Telephone Encounter (Signed)
Spoke with pt and advised her per Dr. Derrel Nip to stop the medication and follow up with GI as planned. Pt gave a verbal understanding.

## 2020-02-04 NOTE — Telephone Encounter (Signed)
Pt said that the medication hydroscycamine is making her mouth dry and throat. She said she usually keep a dry mouth but this is worse. She would like to know Dr. Lupita Dawn opinion on what she should do?

## 2020-02-04 NOTE — Telephone Encounter (Signed)
Stop the medication.  I have no other ideas on how to manage her esophageal spasms,  So I would  follow up with GI as planned

## 2020-02-08 ENCOUNTER — Ambulatory Visit: Payer: Medicare HMO | Admitting: Speech Pathology

## 2020-02-10 ENCOUNTER — Ambulatory Visit: Payer: Medicare HMO | Admitting: Speech Pathology

## 2020-02-15 ENCOUNTER — Other Ambulatory Visit: Payer: Self-pay | Admitting: Gastroenterology

## 2020-02-16 ENCOUNTER — Ambulatory Visit: Payer: Medicare HMO | Admitting: Speech Pathology

## 2020-02-17 ENCOUNTER — Other Ambulatory Visit: Payer: Self-pay | Admitting: Internal Medicine

## 2020-02-17 ENCOUNTER — Telehealth: Payer: Self-pay | Admitting: Internal Medicine

## 2020-02-17 MED ORDER — ONDANSETRON 4 MG PO TBDP
4.0000 mg | ORAL_TABLET | Freq: Three times a day (TID) | ORAL | 0 refills | Status: DC | PRN
Start: 2020-02-17 — End: 2020-06-09

## 2020-02-17 NOTE — Telephone Encounter (Signed)
Patient feels nausea now and would like a refill of Zofran. She uses it as needed. She hasn't been prescribed since December. She sees GI next week as well. Please advise if ok to refill.

## 2020-02-17 NOTE — Addendum Note (Signed)
Addended by: [name] on: 02/17/2020 04:36 PM   Modules accepted: Orders

## 2020-02-17 NOTE — Telephone Encounter (Signed)
Yes I have prescribed it and sent it to [facility],  But I  Can't even find the previous order for it even in the historical section of meds.

## 2020-02-17 NOTE — Telephone Encounter (Signed)
Pt would like a refill on Ondansetron

## 2020-02-21 ENCOUNTER — Ambulatory Visit: Payer: Medicare HMO | Admitting: Speech Pathology

## 2020-02-22 ENCOUNTER — Encounter: Payer: Self-pay | Admitting: Gastroenterology

## 2020-02-22 ENCOUNTER — Ambulatory Visit: Payer: Medicare HMO | Admitting: Gastroenterology

## 2020-02-22 VITALS — BP 144/62 | HR 84 | Ht 62.0 in | Wt 124.5 lb

## 2020-02-22 DIAGNOSIS — K219 Gastro-esophageal reflux disease without esophagitis: Secondary | ICD-10-CM

## 2020-02-22 DIAGNOSIS — Z86010 Personal history of colonic polyps: Secondary | ICD-10-CM

## 2020-02-22 DIAGNOSIS — R1310 Dysphagia, unspecified: Secondary | ICD-10-CM | POA: Diagnosis not present

## 2020-02-22 DIAGNOSIS — K200 Eosinophilic esophagitis: Secondary | ICD-10-CM | POA: Diagnosis not present

## 2020-02-22 MED ORDER — OMEPRAZOLE 40 MG PO CPDR: 40.0000 mg | DELAYED_RELEASE_CAPSULE | Freq: Two times a day (BID) | ORAL | 11 refills | Status: DC

## 2020-02-22 MED ORDER — OMEPRAZOLE 40 MG PO CPDR: 40.0000 mg | DELAYED_RELEASE_CAPSULE | Freq: Two times a day (BID) | ORAL | 3 refills | Status: DC

## 2020-02-22 NOTE — Patient Instructions (Signed)
We have sent the following medications to your pharmacy for you to pick up at your convenience: omeprazole.   You have been scheduled for a colonoscopy. Please follow written instructions given to you at your visit today.  Please pick up your prep supplies at the pharmacy within the next 1-3 days. If you use inhalers (even only as needed), please bring them with you on the day of your procedure.  Thank you for choosing me and [facility].  Pricilla Riffle. Dagoberto Ligas., MD., Marval Regal

## 2020-02-22 NOTE — Progress Notes (Signed)
[facility]   History of Present Illness: This is a 74 year old female with globus sensation, EoE and GERD. She has persistent globus sensation and mild dysphagia. Evaluate by SLP on 6/17 felt dry mouth and esophageal problems were both contributing to dysphagia. Steroid inhaler caused mouth lesions so it was discontinued. Swallowing is current doing well except for occasional difficulty with chicken. Symptoms under best control in several months.   EGD 10/2019 - Z-line regular, 37 cm from the incisors. Mild distal esophagitis - No endoscopic esophageal abnormality to explain patient's dysphagia. - Gastritis. - Normal examined duodenum. - No specimens collected.  Barium esophagram 3/221 IMPRESSION: No mass or stricture. Distal esophageal dysmotility. Significant air within the esophagus with each swallow with patient belching throughout.   Current Medications, Allergies, Past Medical History, Past Surgical History, Family History and Social History were reviewed in [employer] record.   Physical Exam: General: Well developed, well nourished, no acute distress Head: Normocephalic and atraumatic Eyes:  sclerae anicteric, EOMI Ears: Normal auditory acuity Mouth: Not examined, mask on during Covid-19 pandemic Lungs: Clear throughout to auscultation Heart: Regular rate and rhythm; no murmurs, rubs or bruits Abdomen: Soft, non tender and non distended. No masses, hepatosplenomegaly or hernias noted. Normal Bowel sounds Rectal: Not done Musculoskeletal: Symmetrical with no gross deformities  Pulses:  Normal pulses noted Extremities: No clubbing, cyanosis, edema or deformities noted Neurological: Alert oriented x 4, grossly nonfocal Psychological:  Alert and cooperative. Anxious.    Assessment and Recommendations:  1. GERD, EoE. History of esophageal stricture. Globus sensation. Dry mouth contributes to swallowing difficulties. Side effects from inhaler so it was  discontinued. Recent allergy testing was negative.  Symptoms currently well controlled with omeprazole 40 mg po bid which will be continued. DC sucralfate. Consider liquid budesonide if EoE not controlled. Avoid foods that are difficult to swallow.  REV in 1 year.   2. Mild early satiety. Etiology unclear however weight is stable. Appetite is good.   3. Personal history of adenomatous and sessile serrated colon polyps. She is overdue for surviellance colonoscopy and is now ready to proceed. Schedule colonoscopy in September per her request. The risks (including bleeding, perforation, infection, missed lesions, medication reactions and possible hospitalization or surgery if complications occur), benefits, and alternatives to colonoscopy with possible biopsy and possible polypectomy were discussed with the patient and they consent to proceed.

## 2020-02-23 ENCOUNTER — Ambulatory Visit: Payer: Medicare HMO | Admitting: Speech Pathology

## 2020-02-23 NOTE — Addendum Note (Signed)
Addended by: Dorisann Frames L on: 02/23/2020 08:18 AM   Modules accepted: Orders

## 2020-02-28 ENCOUNTER — Other Ambulatory Visit (INDEPENDENT_AMBULATORY_CARE_PROVIDER_SITE_OTHER): Payer: Medicare HMO

## 2020-02-28 ENCOUNTER — Other Ambulatory Visit: Payer: Self-pay

## 2020-02-28 ENCOUNTER — Ambulatory Visit: Payer: Medicare HMO | Admitting: Speech Pathology

## 2020-02-28 DIAGNOSIS — E785 Hyperlipidemia, unspecified: Secondary | ICD-10-CM

## 2020-02-28 DIAGNOSIS — R634 Abnormal weight loss: Secondary | ICD-10-CM | POA: Diagnosis not present

## 2020-02-28 DIAGNOSIS — Z780 Asymptomatic menopausal state: Secondary | ICD-10-CM | POA: Diagnosis not present

## 2020-02-28 DIAGNOSIS — E034 Atrophy of thyroid (acquired): Secondary | ICD-10-CM | POA: Diagnosis not present

## 2020-02-28 DIAGNOSIS — E871 Hypo-osmolality and hyponatremia: Secondary | ICD-10-CM | POA: Diagnosis not present

## 2020-02-28 LAB — LIPID PANEL
Cholesterol: 137 mg/dL (ref 0–200)
HDL: 45.2 mg/dL (ref >39.00)
LDL Cholesterol: 68 mg/dL (ref 0–99)
NonHDL: 91.88
Total CHOL/HDL Ratio: 3
Triglycerides: 120 mg/dL (ref 0.0–149.0)
VLDL: 24 mg/dL (ref 0.0–40.0)

## 2020-02-28 LAB — COMPREHENSIVE METABOLIC PANEL
ALT: 11 U/L (ref 0–35)
AST: 17 U/L (ref 0–37)
Albumin: 4.1 g/dL (ref 3.5–5.2)
Alkaline Phosphatase: 80 U/L (ref 39–117)
BUN: 11 mg/dL (ref 6–23)
CO2: 33 mEq/L — ABNORMAL HIGH (ref 19–32)
Calcium: 9.7 mg/dL (ref 8.4–10.5)
Chloride: 98 mEq/L (ref 96–112)
Creatinine, Ser: 0.77 mg/dL (ref 0.40–1.20)
GFR: 73.14 mL/min (ref >60.00)
Glucose, Bld: 105 mg/dL — ABNORMAL HIGH (ref 70–99)
Potassium: 3.9 mEq/L (ref 3.5–5.1)
Sodium: 137 mEq/L (ref 135–145)
Total Bilirubin: 0.6 mg/dL (ref 0.2–1.2)
Total Protein: 6.7 g/dL (ref 6.0–8.3)

## 2020-02-28 LAB — CBC WITH DIFFERENTIAL/PLATELET
Basophils Absolute: 0.1 10*3/uL (ref 0.0–0.1)
Basophils Relative: 1.2 % (ref 0.0–3.0)
Eosinophils Absolute: 0.3 10*3/uL (ref 0.0–0.7)
Eosinophils Relative: 6.6 % — ABNORMAL HIGH (ref 0.0–5.0)
HCT: 40.4 % (ref 36.0–46.0)
Hemoglobin: 13.8 g/dL (ref 12.0–15.0)
Lymphocytes Relative: 25.4 % (ref 12.0–46.0)
Lymphs Abs: 1.2 10*3/uL (ref 0.7–4.0)
MCHC: 34.1 g/dL (ref 30.0–36.0)
MCV: 87.9 fl (ref 78.0–100.0)
Monocytes Absolute: 0.6 10*3/uL (ref 0.1–1.0)
Monocytes Relative: 13 % — ABNORMAL HIGH (ref 3.0–12.0)
Neutro Abs: 2.5 10*3/uL (ref 1.4–7.7)
Neutrophils Relative %: 53.8 % (ref 43.0–77.0)
Platelets: 287 10*3/uL (ref 150.0–400.0)
RBC: 4.59 Mil/uL (ref 3.87–5.11)
RDW: 12.9 % (ref 11.5–15.5)
WBC: 4.7 10*3/uL (ref 4.0–10.5)

## 2020-02-28 LAB — TSH: TSH: 4.19 u[IU]/mL (ref 0.35–4.50)

## 2020-02-28 LAB — VITAMIN D 25 HYDROXY (VIT D DEFICIENCY, FRACTURES): VITD: 37.34 ng/mL (ref 30.00–100.00)

## 2020-03-01 ENCOUNTER — Ambulatory Visit: Payer: Medicare HMO | Admitting: Speech Pathology

## 2020-03-06 ENCOUNTER — Ambulatory Visit: Payer: Medicare HMO | Admitting: Speech Pathology

## 2020-03-08 ENCOUNTER — Ambulatory Visit: Payer: Medicare HMO | Admitting: Speech Pathology

## 2020-04-13 ENCOUNTER — Telehealth: Payer: Self-pay | Admitting: Gastroenterology

## 2020-04-13 NOTE — Telephone Encounter (Signed)
See 02/22/2020 office note. I really don't think an EGD with dilation will help her globus sensation and mild dysphagia. However we can add on EGD/dilation as long as she understands I don't think it will improve symptoms. Please block an open slot anytime that day in the [facility] if there is not a 1 hour block for the colon/egd.

## 2020-04-13 NOTE — Telephone Encounter (Signed)
Patient called states when she last saw Dr. Fuller Plan he told her she would be able to possibly do a double (EGD) patient is wanting to do that now but she is not sure if insurance will cover since she had one done in March. Please advise

## 2020-04-13 NOTE — Telephone Encounter (Signed)
Patient is continued to c/o dysphagia.  She has a colonoscopy on 04/27/20 with you.  She would like to add on an EGD dil..  There is no additional spot and rescheduling will push her to Nov.  Okay to add on EGD to am case?  She is your 8:30 case now

## 2020-04-14 NOTE — Telephone Encounter (Signed)
Patient notified She will wait on EGD.  Having excess saliva in her throat. She will discuss further when she sees him at the colonoscopy

## 2020-04-20 ENCOUNTER — Encounter: Payer: Self-pay | Admitting: Gastroenterology

## 2020-04-25 ENCOUNTER — Encounter: Payer: Self-pay | Admitting: Internal Medicine

## 2020-04-26 ENCOUNTER — Telehealth: Payer: Self-pay | Admitting: Internal Medicine

## 2020-04-26 MED ORDER — LEVOTHYROXINE SODIUM 75 MCG PO TABS: 75.0000 ug | ORAL_TABLET | Freq: Every day | ORAL | 0 refills | Status: DC

## 2020-04-26 MED ORDER — LEVOTHYROXINE SODIUM 75 MCG PO TABS: 75.0000 ug | ORAL_TABLET | Freq: Every day | ORAL | 1 refills | Status: DC

## 2020-04-26 NOTE — Telephone Encounter (Signed)
Medication has been sent to both [facility] for short supply and [facility] for a 90 day supply.

## 2020-04-26 NOTE — Telephone Encounter (Signed)
[facility] needs a short fill on levothyroxine (SYNTHROID) 75 MCG tablet sent to [facility] and then a 90 day sent to [facility]

## 2020-04-26 NOTE — Addendum Note (Signed)
Addended by: Adair Laundry on: 04/26/2020 03:55 PM   Modules accepted: Orders

## 2020-04-26 NOTE — Telephone Encounter (Signed)
Patient is requesting enough levothyroxine (SYNTHROID) 75 MCG tabletg  for a few days, she only has 2 remaining. [employer] has not yet sent her 90 day supply. [facility] send to [facility] on [address]

## 2020-04-27 ENCOUNTER — Other Ambulatory Visit: Payer: Self-pay

## 2020-04-27 ENCOUNTER — Ambulatory Visit (AMBULATORY_SURGERY_CENTER): Payer: Medicare HMO | Admitting: Gastroenterology

## 2020-04-27 ENCOUNTER — Encounter: Payer: Self-pay | Admitting: Gastroenterology

## 2020-04-27 VITALS — BP 126/88 | HR 74 | Temp 97.3°F | Resp 13 | Ht 62.0 in | Wt 124.0 lb

## 2020-04-27 DIAGNOSIS — Z1211 Encounter for screening for malignant neoplasm of colon: Secondary | ICD-10-CM | POA: Diagnosis not present

## 2020-04-27 DIAGNOSIS — D123 Benign neoplasm of transverse colon: Secondary | ICD-10-CM

## 2020-04-27 DIAGNOSIS — K635 Polyp of colon: Secondary | ICD-10-CM | POA: Diagnosis not present

## 2020-04-27 DIAGNOSIS — Z86010 Personal history of colonic polyps: Secondary | ICD-10-CM | POA: Diagnosis not present

## 2020-04-27 MED ORDER — SODIUM CHLORIDE 0.9 % IV SOLN: 500.0000 mL | Freq: Once | INTRAVENOUS | Status: DC

## 2020-04-27 NOTE — Patient Instructions (Signed)
Handouts on polyps ,diverticulosis ,hemorrhoids, high fiber diet  given to you today  Await pathology results on polyp removed     YOU HAD AN ENDOSCOPIC PROCEDURE TODAY AT [facility]:   Refer to the procedure report that was given to you for any specific questions about what was found during the examination.  If the procedure report does not answer your questions, please call your gastroenterologist to clarify.  If you requested that your care partner not be given the details of your procedure findings, then the procedure report has been included in a sealed envelope for you to review at your convenience later.  YOU SHOULD EXPECT: Some feelings of bloating in the abdomen. Passage of more gas than usual.  Walking can help get rid of the air that was put into your GI tract during the procedure and reduce the bloating. If you had a lower endoscopy (such as a colonoscopy or flexible sigmoidoscopy) you may notice spotting of blood in your stool or on the toilet paper. If you underwent a bowel prep for your procedure, you may not have a normal bowel movement for a few days.  Please Note:  You might notice some irritation and congestion in your nose or some drainage.  This is from the oxygen used during your procedure.  There is no need for concern and it should clear up in a day or so.  SYMPTOMS TO REPORT IMMEDIATELY:   Following lower endoscopy (colonoscopy or flexible sigmoidoscopy):  Excessive amounts of blood in the stool  Significant tenderness or worsening of abdominal pains  Swelling of the abdomen that is new, acute  Fever of 100F or higher    For urgent or emergent issues, a gastroenterologist can be reached at any hour by calling [phone]. Do not use MyChart messaging for urgent concerns.    DIET:  We do recommend a small meal at first, but then you may proceed to your regular diet.  Drink plenty of fluids but you should avoid alcoholic beverages for 24  hours.  ACTIVITY:  You should plan to take it easy for the rest of today and you should NOT DRIVE or use heavy machinery until tomorrow (because of the sedation medicines used during the test).    FOLLOW UP: Our staff will call the number listed on your records 48-72 hours following your procedure to check on you and address any questions or concerns that you may have regarding the information given to you following your procedure. If we do not reach you, we will leave a message.  We will attempt to reach you two times.  During this call, we will ask if you have developed any symptoms of COVID 19. If you develop any symptoms (ie: fever, flu-like symptoms, shortness of breath, cough etc.) before then, please call [phone].  If you test positive for Covid 19 in the 2 weeks post procedure, please call and report this information to [place].    If any biopsies were taken you will be contacted by phone or by letter within the next 1-3 weeks.  Please call us at [phone] if you have not heard about the biopsies in 3 weeks.    SIGNATURES/CONFIDENTIALITY: You and/or your care partner have signed paperwork which will be entered into your electronic medical record.  These signatures attest to the fact that that the information above on your After Visit Summary has been reviewed and is understood.  Full responsibility of the confidentiality of this  discharge information lies with you and/or your care-partner.

## 2020-04-27 NOTE — Op Note (Signed)
[facility] Patient Name: Crystal Kidd Procedure Date: 04/27/2020 8:20 AM MRN: [id number] Endoscopist: Ladene Artist , MD Age: 74 Referring MD:  Date of Birth: November 08, 1945 Gender: Female Account #: [id number] Procedure:                Colonoscopy Indications:              Surveillance: Personal history of adenomatous                            polyps and sessile serrated polyps on last                            colonoscopy > 3 years ago Medicines:                Monitored Anesthesia Care Procedure:                Pre-Anesthesia Assessment:                           - Prior to the procedure, a History and Physical                            was performed, and patient medications and                            allergies were reviewed. The patient's tolerance of                            previous anesthesia was also reviewed. The risks                            and benefits of the procedure and the sedation                            options and risks were discussed with the patient.                            All questions were answered, and informed consent                            was obtained. Prior Anticoagulants: The patient has                            taken no previous anticoagulant or antiplatelet                            agents. ASA Grade Assessment: II - A patient with                            mild systemic disease. After reviewing the risks                            and benefits, the patient was deemed in  satisfactory condition to undergo the procedure.                           After obtaining informed consent, the colonoscope                            was passed under direct vision. Throughout the                            procedure, the patient's blood pressure, pulse, and                            oxygen saturations were monitored continuously. The                            Colonoscope was introduced through the anus  and                            advanced to the the cecum, identified by                            appendiceal orifice and ileocecal valve. The                            ileocecal valve, appendiceal orifice, and rectum                            were photographed. The quality of the bowel                            preparation was good. The colonoscopy was performed                            without difficulty. The patient tolerated the                            procedure well. Scope In: 8:29:28 AM Scope Out: 8:43:42 AM Scope Withdrawal Time: 0 hours 10 minutes 15 seconds  Total Procedure Duration: 0 hours 14 minutes 14 seconds  Findings:                 The perianal and digital rectal examinations were                            normal.                           A 6 mm polyp was found in the transverse colon. The                            polyp was sessile. The polyp was removed with a                            cold snare. Resection and retrieval were complete.  Multiple medium-mouthed diverticula were found in                            the left colon. There was narrowing of the colon in                            association with the diverticular opening. There                            was evidence of diverticular spasm. There was no                            evidence of diverticular bleeding.                           Internal hemorrhoids were found during                            retroflexion. The hemorrhoids were small and Grade                            I (internal hemorrhoids that do not prolapse).                           The exam was otherwise without abnormality on                            direct and retroflexion views. Complications:            No immediate complications. Estimated blood loss:                            None. Estimated Blood Loss:     Estimated blood loss: none. Impression:               - One 6 mm polyp in the  transverse colon, removed                            with a cold snare. Resected and retrieved.                           - Moderate diverticulosis in the left colon.                           - Internal hemorrhoids.                           - The examination was otherwise normal on direct                            and retroflexion views. Recommendation:           - Patient has a contact number available for                            emergencies. The signs and symptoms of  potential                            delayed complications were discussed with the                            patient. Return to normal activities tomorrow.                            Written discharge instructions were provided to the                            patient.                           - High fiber diet.                           - Continue present medications.                           - Await pathology results.                           - No repeat colonoscopy due to age. Ladene Artist, MD 04/27/2020 8:47:24 AM This report has been signed electronically.

## 2020-04-27 NOTE — Progress Notes (Signed)
Called to room to assist during endoscopic procedure.  Patient ID and intended procedure confirmed with present staff. Received instructions for my participation in the procedure from the performing physician.  

## 2020-04-27 NOTE — Progress Notes (Signed)
VS- Etheleen Nicks RN  Drank only half of this am's prep "because I was running clear."  States "no solid stool in toilet, clear, yellow."

## 2020-04-27 NOTE — Progress Notes (Signed)
A and O x3. Report to RN. Tolerated MAC anesthesia well.

## 2020-05-01 ENCOUNTER — Telehealth: Payer: Self-pay

## 2020-05-01 NOTE — Telephone Encounter (Signed)
  Follow up Call-  Call back number 04/27/2020 07/20/2019  Post procedure Call Back phone  # [phone]  Permission to leave phone message Yes Yes  Some recent data might be hidden     Patient questions:  Do you have a fever, pain , or abdominal swelling? No. Pain Score  0 *  Have you tolerated food without any problems? Yes.    Have you been able to return to your normal activities? Yes.    Do you have any questions about your discharge instructions: Diet   No. Medications  No. Follow up visit  No.  Do you have questions or concerns about your Care? No.  Actions: * If pain score is 4 or above: No action needed, pain <4.   1. Have you developed a fever since your procedure? No   2.   Have you had an respiratory symptoms (SOB or cough) since your procedure? No   3.   Have you tested positive for COVID 19 since your procedure? No   4.   Have you had any family members/close contacts diagnosed with the COVID 19 since your procedure?  No    If yes to any of these questions please route to Joylene John, RN and Joella Prince, RN

## 2020-05-04 ENCOUNTER — Encounter: Payer: Self-pay | Admitting: Gastroenterology

## 2020-05-12 ENCOUNTER — Ambulatory Visit: Payer: Medicare HMO

## 2020-06-02 ENCOUNTER — Other Ambulatory Visit: Payer: Self-pay

## 2020-06-02 ENCOUNTER — Ambulatory Visit (INDEPENDENT_AMBULATORY_CARE_PROVIDER_SITE_OTHER): Payer: Medicare HMO

## 2020-06-02 DIAGNOSIS — Z23 Encounter for immunization: Secondary | ICD-10-CM

## 2020-06-06 ENCOUNTER — Other Ambulatory Visit: Payer: Medicare HMO

## 2020-06-09 ENCOUNTER — Telehealth: Payer: Self-pay

## 2020-06-09 MED ORDER — ONDANSETRON 4 MG PO TBDP: 4.0000 mg | ORAL_TABLET | Freq: Three times a day (TID) | ORAL | 3 refills | Status: DC | PRN

## 2020-06-09 MED ORDER — HYOSCYAMINE SULFATE 0.125 MG PO TBDP
ORAL_TABLET | ORAL | 3 refills | Status: DC
Start: 2020-06-09 — End: 2020-08-01

## 2020-06-09 NOTE — Telephone Encounter (Signed)
Refills sent

## 2020-06-09 NOTE — Telephone Encounter (Signed)
Pt states that she has had nausea/pain. She is wondering if she can have a call back about medication that she has taken in the past. I did not see medication on her list. Please advise

## 2020-06-09 NOTE — Telephone Encounter (Signed)
Hyoscyamine sent

## 2020-06-09 NOTE — Telephone Encounter (Signed)
Pt asked if you would refill Zofran. She isn't have trouble swallowing any longer, but at lunch & dinner she feels very full with some stomach pain. She said that she has to eat small meals. She stated that medication really helped her previously.

## 2020-06-09 NOTE — Telephone Encounter (Signed)
I spoke with patient to let her know that med was sent. I asked that she call us back to let us know if no improvement or if she needed December appointment moved up sooner.

## 2020-06-09 NOTE — Telephone Encounter (Signed)
There was confusion earlier when I spoke with patient. She now stated that is hyoscyamine (ANASPAZ) 0.125 MG TBDP disintergrating tablet is what she had wanted refilled.

## 2020-06-13 DIAGNOSIS — D1801 Hemangioma of skin and subcutaneous tissue: Secondary | ICD-10-CM | POA: Diagnosis not present

## 2020-06-13 DIAGNOSIS — D225 Melanocytic nevi of trunk: Secondary | ICD-10-CM | POA: Diagnosis not present

## 2020-06-13 DIAGNOSIS — L718 Other rosacea: Secondary | ICD-10-CM | POA: Diagnosis not present

## 2020-06-13 DIAGNOSIS — L80 Vitiligo: Secondary | ICD-10-CM | POA: Diagnosis not present

## 2020-06-13 DIAGNOSIS — L814 Other melanin hyperpigmentation: Secondary | ICD-10-CM | POA: Diagnosis not present

## 2020-06-13 DIAGNOSIS — L821 Other seborrheic keratosis: Secondary | ICD-10-CM | POA: Diagnosis not present

## 2020-06-13 DIAGNOSIS — L570 Actinic keratosis: Secondary | ICD-10-CM | POA: Diagnosis not present

## 2020-06-20 DIAGNOSIS — H10413 Chronic giant papillary conjunctivitis, bilateral: Secondary | ICD-10-CM | POA: Diagnosis not present

## 2020-06-20 DIAGNOSIS — H16223 Keratoconjunctivitis sicca, not specified as Sjogren's, bilateral: Secondary | ICD-10-CM | POA: Diagnosis not present

## 2020-06-20 DIAGNOSIS — H04123 Dry eye syndrome of bilateral lacrimal glands: Secondary | ICD-10-CM | POA: Diagnosis not present

## 2020-06-20 DIAGNOSIS — Z961 Presence of intraocular lens: Secondary | ICD-10-CM | POA: Diagnosis not present

## 2020-06-30 ENCOUNTER — Telehealth: Payer: Self-pay | Admitting: Internal Medicine

## 2020-06-30 DIAGNOSIS — R1319 Other dysphagia: Secondary | ICD-10-CM

## 2020-06-30 NOTE — Telephone Encounter (Signed)
Spoke with pt and she stated that she is needing a referral to a different GI then [place]. Pt would like to have a second opinion on her esophageal issues. Pt stated that she feels like her food is not moving and she gets full very quick and she has started having the build up of mucus in her throat again. Pt stated that she is not having any trouble swallowing so she is eating and drinking fine just gets full really fast.

## 2020-06-30 NOTE — Addendum Note (Signed)
Addended by: [name] on: 06/30/2020 04:43 PM   Modules accepted: Orders

## 2020-06-30 NOTE — Telephone Encounter (Signed)
I HAVE MADE THE REFERRAL TO WHICHEVER IS IN NETWORK: [facility] OR [facility]

## 2020-06-30 NOTE — Telephone Encounter (Signed)
Pt is aware that referral has been placed.  

## 2020-06-30 NOTE — Telephone Encounter (Signed)
Pt would like a referral placed to GI that is not [facility]

## 2020-07-12 ENCOUNTER — Other Ambulatory Visit: Payer: Self-pay

## 2020-07-12 ENCOUNTER — Ambulatory Visit
Admission: RE | Admit: 2020-07-12 | Discharge: 2020-07-12 | Disposition: A | Discharge status: Home / Self Care | Payer: Medicare HMO | Source: Ambulatory Visit | Attending: Internal Medicine | Admitting: Internal Medicine

## 2020-07-12 DIAGNOSIS — Z780 Asymptomatic menopausal state: Secondary | ICD-10-CM | POA: Diagnosis not present

## 2020-07-12 DIAGNOSIS — Z8739 Personal history of other diseases of the musculoskeletal system and connective tissue: Secondary | ICD-10-CM | POA: Diagnosis not present

## 2020-07-12 DIAGNOSIS — G629 Polyneuropathy, unspecified: Secondary | ICD-10-CM | POA: Diagnosis not present

## 2020-07-14 ENCOUNTER — Encounter: Payer: Self-pay | Admitting: Internal Medicine

## 2020-07-14 NOTE — Progress Notes (Signed)
Your Bone Density scores have been received, and it appears that you have osteoporosis which  places you at higher risk for fracture over the next 10 years.    I recommend  continuing (or starting) Evista as previously discussed Also  continue to work at getting a minimum  calcium intake of 1200 to 1800 mg daily through diet and supplements ,  And 2000 units of vitamin d daily as well.  weight bearing exercise on a regular basis, has been shown to be helpful as well.  Regards,  Dr. Derrel Nip

## 2020-08-01 ENCOUNTER — Other Ambulatory Visit: Payer: Self-pay

## 2020-08-01 ENCOUNTER — Ambulatory Visit (INDEPENDENT_AMBULATORY_CARE_PROVIDER_SITE_OTHER): Payer: Medicare HMO | Admitting: Internal Medicine

## 2020-08-01 ENCOUNTER — Encounter: Payer: Self-pay | Admitting: Internal Medicine

## 2020-08-01 VITALS — BP 148/76 | HR 92 | Temp 98.2°F | Resp 15 | Ht 62.0 in | Wt 111.8 lb

## 2020-08-01 DIAGNOSIS — E034 Atrophy of thyroid (acquired): Secondary | ICD-10-CM | POA: Diagnosis not present

## 2020-08-01 DIAGNOSIS — R1319 Other dysphagia: Secondary | ICD-10-CM

## 2020-08-01 DIAGNOSIS — R634 Abnormal weight loss: Secondary | ICD-10-CM

## 2020-08-01 DIAGNOSIS — N179 Acute kidney failure, unspecified: Secondary | ICD-10-CM | POA: Diagnosis not present

## 2020-08-01 DIAGNOSIS — I10 Essential (primary) hypertension: Secondary | ICD-10-CM

## 2020-08-01 MED ORDER — HYOSCYAMINE SULFATE 0.125 MG PO TBDP: ORAL_TABLET | ORAL | 3 refills | Status: DC

## 2020-08-01 MED ORDER — ALPRAZOLAM 0.25 MG PO TABS: ORAL_TABLET | ORAL | 0 refills | Status: DC

## 2020-08-01 NOTE — Patient Instructions (Addendum)
EAT SLOWLY AND DO NOT INGEST AIR WHILE EATING.   CHEW FOOD THROUGHLY AND SIP WATER JUST ENOUGH TO MOISTEN FOOD    SUSPEND HYOSCYAMINE TO SEE IF IT IS REALLY MAKING A DIFFERENE   ANY EPISODES OF WATERY STOOLS SHOULD RESULT IN A RETROSPECTIVE FOOD DIARY   RESUME MIRALAX  AND 100 MG COLACE AT NIGHT (DOCUSATE,  STOOL SOFTENER)    I WANT YOU TO TRY TAKING 1/2 TABLET OF ALPRAZOLAM 15 MINUTES BEFORE LUNCH OR  EVENING MEAL TO HELP WITH ANY ANXIETY RELATED TO EATING

## 2020-08-01 NOTE — Progress Notes (Signed)
Subjective:  Patient ID: Crystal Kidd, female    DOB: [birth year]  Age: 74 y.o. MRN: [id number]  CC: The primary encounter diagnosis was Unintentional weight loss of 10% body weight within 6 months. Diagnoses of Esophageal dysphagia, AKI (acute kidney injury) ([place]), Hypothyroidism due to acquired atrophy of thyroid, Excessive body weight loss, and Essential hypertension were also pertinent to this visit.  HPI Crystal Kidd presents for 6 month follow up on chronic issues including hypertension hypothyroidism and EoE with persistent  dysphagia/globus despite normal EGD march 2021  This visit occurred during the SARS-CoV-2 public health emergency.  Safety protocols were in place, including screening questions prior to the visit, additional usage of staff PPE, and extensive cleaning of exam room while observing appropriate contact time as indicated for disinfecting solutions.   EGD march 2021 no stricture.  Distal esophageal dysmotility noted on barium swallow.  She has continued Weight loss of 13 lbs since the summer.  Was 140 lbs in March at hospitalization .  gets full easily and gets nauseated if she eats too much.  [place] when she belches . Did not tolerate Premier Protein due to increased mucus production. Diet reviewed: Eats 1/2 fish fillet, a few teaspoons of vegetables.  sMALL dinner  Wants to resume reglan which was given to her in hospital; allowed her to eat an entire meal  Constipation:  Takes miralax daily,  Has trouble ejecting stool from rectum,  Even when taking miralax daily.  Stopped miralax transiently  because she started having loose stools and incontinence/ which lasted a week. Completed screening colonoscopy in Sept  One hyperplastic polyp.  No repeat planned    Outpatient Medications Prior to Visit  Medication Sig Dispense Refill   Alum Hydroxide-Mag Carbonate (GAVISCON PO) Take 1 capsule by mouth daily.     amLODipine (NORVASC) 5 MG tablet Take 1  tablet (5 mg total) by mouth daily. 90 tablet 3   gabapentin (NEURONTIN) 100 MG capsule TAKE 1 CAPSULE (100 MG TOTAL) BY MOUTH 3 (THREE) TIMES DAILY. EVERY 8 HOURS AS NEEDED (Patient taking differently: Take 100 mg by mouth 3 (three) times daily.) 90 capsule 1   hydrochlorothiazide (HYDRODIURIL) 25 MG tablet Take 1 tablet (25 mg total) by mouth daily. 90 tablet 1   levothyroxine (SYNTHROID) 75 MCG tablet Take 1 tablet (75 mcg total) by mouth daily before breakfast. 90 tablet 1   loratadine (CLARITIN) 10 MG tablet Take 10 mg by mouth daily.     omeprazole (PRILOSEC) 40 MG capsule Take 1 capsule (40 mg total) by mouth 2 (two) times daily before a meal. 180 capsule 3   ondansetron (ZOFRAN ODT) 4 MG disintegrating tablet Take 1 tablet (4 mg total) by mouth every 8 (eight) hours as needed for nausea or vomiting. 20 tablet 3   Polyethyl Glycol-Propyl Glycol (SYSTANE OP) Place 1 drop into both eyes 4 (four) times daily as needed (dry eyes).     polyethylene glycol (MIRALAX / GLYCOLAX) 17 g packet Take 17 g by mouth daily.     RESTASIS 0.05 % ophthalmic emulsion      simvastatin (ZOCOR) 20 MG tablet Take 1 tablet (20 mg total) by mouth every evening. 90 tablet 3   hyoscyamine (ANASPAZ) 0.125 MG TBDP disintergrating tablet Dissolve under tongue before each meal 90 tablet 3   Simethicone (GAS-X PO) Take 1-3 capsules by mouth daily.  (Patient not taking: Reported on 08/01/2020)     No facility-administered medications prior  to visit.    Review of Systems;  Patient denies headache, fevers, malaise, unintentional weight loss, skin rash, eye pain, sinus congestion and sinus pain, sore throat, dysphagia,  hemoptysis , cough, dyspnea, wheezing, chest pain, palpitations, orthopnea, edema, abdominal pain, nausea, melena, diarrhea, constipation, flank pain, dysuria, hematuria, urinary  Frequency, nocturia, numbness, tingling, seizures,  Focal weakness, Loss of consciousness,  Tremor, insomnia,  depression, anxiety, and suicidal ideation.      Objective:  BP (!) 148/76 (BP Location: Left Arm, Patient Position: Sitting, Cuff Size: Normal)    Pulse 92    Temp 98.2 F (36.8 C) (Oral)    Resp 15    Ht 5\' 2"  (1.575 m)    Wt 111 lb 12.8 oz (50.7 kg)    SpO2 96%    BMI 20.45 kg/m   BP Readings from Last 3 Encounters:  08/01/20 (!) 148/76  04/27/20 126/88  02/22/20 (!) 144/62    Wt Readings from Last 3 Encounters:  08/01/20 111 lb 12.8 oz (50.7 kg)  04/27/20 124 lb (56.2 kg)  02/22/20 124 lb 8 oz (56.5 kg)    General appearance: alert, cooperative and appears stated age Ears: normal TM's and external ear canals both ears Throat: lips, mucosa, and tongue normal; teeth and gums normal Neck: no adenopathy, no carotid bruit, supple, symmetrical, trachea midline and thyroid not enlarged, symmetric, no tenderness/mass/nodules Back: symmetric, no curvature. ROM normal. No CVA tenderness. Lungs: clear to auscultation bilaterally Heart: regular rate and rhythm, S1, S2 normal, no murmur, click, rub or gallop Abdomen: soft, non-tender; bowel sounds normal; no masses,  no organomegaly Pulses: 2+ and symmetric Skin: Skin color, texture, turgor normal. No rashes or lesions Lymph nodes: Cervical, supraclavicular, and axillary nodes normal.  Lab Results  Component Value Date   HGBA1C 5.5 06/20/2017   HGBA1C 5.6 08/25/2012   HGBA1C 5.6 05/05/2008    Lab Results  Component Value Date   CREATININE 0.75 08/01/2020   CREATININE 0.77 02/28/2020   CREATININE 0.85 12/28/2019    Lab Results  Component Value Date   WBC 4.7 02/28/2020   HGB 13.8 02/28/2020   HCT 40.4 02/28/2020   PLT 287.0 02/28/2020   GLUCOSE 100 (H) 08/01/2020   CHOL 137 02/28/2020   TRIG 120.0 02/28/2020   HDL 45.20 02/28/2020   LDLDIRECT 79.0 11/30/2015   LDLCALC 68 02/28/2020   ALT 13 08/01/2020   AST 20 08/01/2020   NA 141 08/01/2020   K 4.1 08/01/2020   CL 103 08/01/2020   CREATININE 0.75 08/01/2020    BUN 9 08/01/2020   CO2 32 08/01/2020   TSH 0.65 08/01/2020   HGBA1C 5.5 06/20/2017    DG Bone Density  Result Date: 07/12/2020 EXAM: DUAL X-RAY ABSORPTIOMETRY (DXA) FOR BONE MINERAL DENSITY IMPRESSION: Your patient Crystal Kidd completed a BMD test on 07/12/2020 using the [place] (software version: 14.10) manufactured by [employer]. The following summarizes the results of our evaluation. Technologist:VLM PATIENT BIOGRAPHICAL: Name: Cleone, Thurgood Patient ID: [id number] Birth Date: 11-18-1945 Height: 62.0 in. Gender: Female Exam Date: 07/12/2020 Weight: 112.0 lbs. Indications: Caucasian, Family History of Fracture, Postmenopausal, Advanced Age, History of Fracture (Adult), Oophorectomy Bilateral, Hysterectomy, Early Menopause, neuropathy Fractures: Right ankle Treatments: CALCIUM VIT D, gabapentin, Multi-Vitamin with calcium, omeprazole, simvastatin DENSITOMETRY RESULTS: Site      Region     Measured Date Measured Age WHO Classification Young Adult T-score BMD         %Change vs. Previous Significant Change (*)  AP Spine L1-L4 (L3) 07/12/2020 75.0 Osteopenia -2.0 0.935 g/cm2 -7.6% Yes AP Spine L1-L4 (L3) 12/26/2015 70.5 Osteopenia -1.4 1.012 g/cm2 - - DualFemur Neck Left 07/12/2020 75.0 Osteoporosis -2.8 0.654 g/cm2 -7.9% Yes DualFemur Neck Left 12/26/2015 70.5 Osteopenia -2.4 0.710 g/cm2 - - DualFemur Total Mean 07/12/2020 75.0 Osteopenia -2.4 0.703 g/cm2 -12.0% Yes DualFemur Total Mean 12/26/2015 70.5 Osteopenia -1.7 0.799 g/cm2 - - ASSESSMENT: The BMD measured at Femur Neck Left is 0.654 g/cm2 with a T-score of -2.8. This patient is considered osteoporotic according to [facility]) criteria. L-3 was excluded due to degenerative changes. Compared with prior study, there has been significant decrease in the spine. Compared with prior study, there has been significant decrease in the total hip. The scan quality is good. World Pharmacologist [facility]) criteria for  post-menopausal, Caucasian Women: Normal:                   T-score at or above -1 SD Osteopenia/low bone mass: T-score between -1 and -2.5 SD Osteoporosis:             T-score at or below -2.5 SD RECOMMENDATIONS: 1. All patients should optimize calcium and vitamin D intake. 2. Consider FDA-approved medical therapies in postmenopausal women and men aged 53 years and older, based on the following: a. A hip or vertebral(clinical or morphometric) fracture b. T-score < -2.5 at the femoral neck or spine after appropriate evaluation to exclude secondary causes c. Low bone mass (T-score between -1.0 and -2.5 at the femoral neck or spine) and a 10-year probability of a hip fracture > 3% or a 10-year probability of a major osteoporosis-related fracture > 20% based on the [place]-adapted WHO algorithm 3. Clinician judgment and/or patient preferences may indicate treatment for people with 10-year fracture probabilities above or below these levels FOLLOW-UP: People with diagnosed cases of osteoporosis or at high risk for fracture should have regular bone mineral density tests. For patients eligible for Medicare, routine testing is allowed once every 2 years. The testing frequency can be increased to one year for patients who have rapidly progressing disease, those who are receiving or discontinuing medical therapy to restore bone mass, or have additional risk factors. I have reviewed this report, and agree with the above findings. [facility] Electronically Signed   By: Lowella Grip III M.D.   On: 07/12/2020 13:06    Assessment & Plan:   Problem List Items Addressed This Visit      Unprioritized   Hypothyroidism    Thyroid function is WNL on current dose.  No current changes needed.       Essential hypertension    Taking amlodipine and hctz.  If nutcracker esophagus is diagnosed by GI, medication change to Diltiazem will be needed       Relevant Medications   diltiazem (CARDIZEM) 30 MG tablet    Esophageal dysphagia    Symptoms have persisted despite GI evaluation stating otherwise and normal EGD.  Suspect nutracker esophagus.  Has 2nd opinion  With Dr Allen Norris.  Trial of alprazolam was not tolerated.  Short acting diltiazem trail 30 mg before eating       AKI (acute kidney injury) ([place])    Resolved with hydration   Lab Results  Component Value Date   CREATININE 0.75 08/01/2020   Lab Results  Component Value Date   NA 141 08/01/2020   K 4.1 08/01/2020   CL 103 08/01/2020   CO2 32 08/01/2020  Excessive body weight loss    Secondary to dysphagia .  Prealbumin is low.  2nd opinion from GI is in progress.         Other Visit Diagnoses    Unintentional weight loss of 10% body weight within 6 months    -  Primary   Relevant Orders   TSH (Completed)   Prealbumin (Completed)   Comprehensive metabolic panel (Completed)      I have discontinued Letta Median A. Watt's Simethicone (GAS-X PO). I am also having her start on ALPRAZolam and diltiazem. Additionally, I am having her maintain her Polyethyl Glycol-Propyl Glycol (SYSTANE OP), gabapentin, polyethylene glycol, hydrochlorothiazide, amLODipine, simvastatin, loratadine, omeprazole, levothyroxine, ondansetron, Restasis, Alum Hydroxide-Mag Carbonate (GAVISCON PO), and hyoscyamine.  Meds ordered this encounter  Medications   hyoscyamine (ANASPAZ) 0.125 MG TBDP disintergrating tablet    Sig: Dissolve under tongue before each meal    Dispense:  90 tablet    Refill:  3    KEEP ON FILE FOR FUTURE REFILLS   ALPRAZolam (XANAX) 0.25 MG tablet    Sig: 0.5 tablet 15 minutes prior to lunch and dinner as needed    Dispense:  20 tablet    Refill:  0   diltiazem (CARDIZEM) 30 MG tablet    Sig: 1 tablet 30 minutes prior to meals    Dispense:  30 tablet    Refill:  0    Medications Discontinued During This Encounter  Medication Reason   Simethicone (GAS-X PO)    hyoscyamine (ANASPAZ) 0.125 MG TBDP disintergrating tablet  Reorder   40 minutes  Was spent  reviewing patient's current problems and recent encounters with  specialists, reviewing and ordering  labs and imaging studies, providing counseling on the above mentioned problems , and evaluating patient  In a face to face visit  .  Follow-up: No follow-ups on file.   [name], MD

## 2020-08-02 LAB — COMPREHENSIVE METABOLIC PANEL
ALT: 13 U/L (ref 0–35)
AST: 20 U/L (ref 0–37)
Albumin: 4.3 g/dL (ref 3.5–5.2)
Alkaline Phosphatase: 94 U/L (ref 39–117)
BUN: 9 mg/dL (ref 6–23)
CO2: 32 mEq/L (ref 19–32)
Calcium: 9.9 mg/dL (ref 8.4–10.5)
Chloride: 103 mEq/L (ref 96–112)
Creatinine, Ser: 0.75 mg/dL (ref 0.40–1.20)
GFR: 78.04 mL/min (ref >60.00)
Glucose, Bld: 100 mg/dL — ABNORMAL HIGH (ref 70–99)
Potassium: 4.1 mEq/L (ref 3.5–5.1)
Sodium: 141 mEq/L (ref 135–145)
Total Bilirubin: 0.5 mg/dL (ref 0.2–1.2)
Total Protein: 6.7 g/dL (ref 6.0–8.3)

## 2020-08-02 LAB — TSH: TSH: 0.65 u[IU]/mL (ref 0.35–4.50)

## 2020-08-02 LAB — PREALBUMIN: Prealbumin: 14 mg/dL — ABNORMAL LOW (ref 17–34)

## 2020-08-03 ENCOUNTER — Telehealth: Payer: Self-pay | Admitting: Internal Medicine

## 2020-08-03 NOTE — Telephone Encounter (Signed)
Patient stated she will stop for now. She stated if dr Derrel Nip wants to try something else we can go ahead and send in.

## 2020-08-03 NOTE — Telephone Encounter (Signed)
She can stop or try 1/2 the dose

## 2020-08-03 NOTE — Telephone Encounter (Signed)
Patient started ALPRAZolam (XANAX) 0.25 MG tablet, she only has taken one. This morning her hands are shaking. Should she continue taking medication.

## 2020-08-04 DIAGNOSIS — R634 Abnormal weight loss: Secondary | ICD-10-CM | POA: Insufficient documentation

## 2020-08-04 MED ORDER — DILTIAZEM HCL 30 MG PO TABS: ORAL_TABLET | ORAL | 0 refills | Status: DC

## 2020-08-04 NOTE — Assessment & Plan Note (Signed)
Secondary to dysphagia .  Prealbumin is low.  2nd opinion from GI is in progress.

## 2020-08-04 NOTE — Progress Notes (Signed)
I understand you did not tolerate the alprazolam.  I have sent an alternative called diltiazem to your pharmacy as a trial for a week.  Take it 30 minutes prior to meals.

## 2020-08-04 NOTE — Assessment & Plan Note (Signed)
Thyroid function is WNL on current dose.  No current changes needed.  

## 2020-08-04 NOTE — Assessment & Plan Note (Addendum)
Symptoms have persisted despite GI evaluation stating otherwise and normal EGD.  Suspect nutracker esophagus.  Has 2nd opinion  With Dr Allen Norris.  Trial of alprazolam was not tolerated.  Short acting diltiazem trail 30 mg before eating

## 2020-08-04 NOTE — Assessment & Plan Note (Signed)
Resolved with hydration   Lab Results  Component Value Date   CREATININE 0.75 08/01/2020   Lab Results  Component Value Date   NA 141 08/01/2020   K 4.1 08/01/2020   CL 103 08/01/2020   CO2 32 08/01/2020

## 2020-08-04 NOTE — Assessment & Plan Note (Signed)
Taking amlodipine and hctz.  If nutcracker esophagus is diagnosed by GI, medication change to Diltiazem will be needed

## 2020-08-05 ENCOUNTER — Other Ambulatory Visit: Payer: Self-pay | Admitting: Internal Medicine

## 2020-08-05 DIAGNOSIS — E034 Atrophy of thyroid (acquired): Secondary | ICD-10-CM

## 2020-08-05 MED ORDER — LEVOTHYROXINE SODIUM 50 MCG PO TABS: 50.0000 ug | ORAL_TABLET | Freq: Every day | ORAL | 1 refills | Status: DC

## 2020-08-05 NOTE — Progress Notes (Signed)
Your thyroid is overactive on current thyroid medication. I have sent a lower dose of levothyroxine to your pharmacy.  You need to start the new dose ASAP and have a repeat TSH level after  6 weeks. If you have already refilled the higher dose,  you can continue it but omit one day's dose per week  and just take it 6 days  per week instead of 7  and recheck thyroid level in 6 weeks

## 2020-08-18 ENCOUNTER — Telehealth: Payer: Self-pay | Admitting: Internal Medicine

## 2020-08-18 NOTE — Telephone Encounter (Signed)
Patient called in wanted to know was she suppose to get her THS checked in 6 weeks from last visit

## 2020-08-18 NOTE — Telephone Encounter (Signed)
Unable to reach patient. Patients phone is not available at this time. She does need recheck TSH 6 weeks from 08-01-20 per lab resulted. Orders have already been placed.

## 2020-08-22 NOTE — Telephone Encounter (Signed)
LMTCB to schedule lab appt for first week in feb

## 2020-08-23 NOTE — Telephone Encounter (Signed)
Patient called back and scheduled labs for 09/12/20

## 2020-08-26 ENCOUNTER — Other Ambulatory Visit: Payer: Self-pay | Admitting: Internal Medicine

## 2020-08-29 ENCOUNTER — Ambulatory Visit: Payer: Medicare HMO | Admitting: Gastroenterology

## 2020-08-29 ENCOUNTER — Encounter: Payer: Self-pay | Admitting: Gastroenterology

## 2020-08-29 VITALS — BP 139/64 | HR 93 | Ht 62.0 in | Wt 108.2 lb

## 2020-08-29 DIAGNOSIS — R634 Abnormal weight loss: Secondary | ICD-10-CM | POA: Diagnosis not present

## 2020-08-29 DIAGNOSIS — R112 Nausea with vomiting, unspecified: Secondary | ICD-10-CM

## 2020-08-29 NOTE — Progress Notes (Signed)
Gastroenterology Consultation  Referring Provider:     [name], MD Primary Care Physician:  Crystal Mc, MD Primary Gastroenterologist:  Dr. Allen Norris     Reason for Consultation:     Dysphagia with abdominal pain        HPI:   Crystal Kidd is a 75 y.o. y/o female referred for consultation & management of Dysphagia with abdominal pain by Dr. Derrel Nip, Aris Everts, MD.  This patient comes in today with a history of abdominal discomfort and dysphagia with being seen by Dr. Fuller Plan in [place] for this for some time.  The patient also was in the hospital and had an upper endoscopy by one of his partners.  The patient has had 2 upper endoscopies in the last 13 months which did not show any abnormality to explain her symptoms.  The patient states that she has continued to lose weight because she states that she feels full quickly.  She also has nausea and states that she was given something sublingually.The patient was also evaluated with speech pathology with the following recommendations:  Clinical Impression Patient presents with oropharyngeal swallow that is [facility]. During the study the pt complained of "something stuck in her throat," but her pharynx remained clear. Following bites of regular texture solids, pt was noted to have trace-mild esophageal backflow into the pharynx, but reduced as the pt utilized a liquid wash or took multiple swallows. As observed in the bedside swallow evaluation, pt also had significant belching following all trials. As far as an oropharyngeal standpoint, recommend thin liquids and up to regular texture solids, ensuring the pt sits upright >30 minutes following meals, but do recommend proceeding with esophagram. Will defer solid advancement to GI/MD.    A barium swallow did not show any strictures or narrowing but did show distal esophageal dysmotility with significant air within the esophagus with each swallow.  The patient reports that she was taking a protein drink  called premier and was making smoothies out of it but it gave her the feeling of having a lot of mucus in her throat.  Due to this she stopped it.  She also reports that she is been having trouble with her bowel movements and states that she feels like her stools were cut off before she is finished and she takes MiraLAX for this.  She also had a colonoscopy recently that did not show any cause for concern.  Past Medical History:  Diagnosis Date  . Allergic rhinoconjunctivitis   . Allergy 1992  . Arthritis   . Cataract   . Diverticulosis   . Eosinophilic esophagitis   . GERD (gastroesophageal reflux disease)    PMH of esophageal stricture  . Hyperlipidemia   . Hypertension   . Osteopenia    BMD @[facility]   . Thyroid disease    hypothyroidism  . Transfusion history 1968   post partum  . Trapezius muscle spasm 04/13/2015  . Vitiligo     Past Surgical History:  Procedure Laterality Date  . ABDOMINAL HYSTERECTOMY     for dysfunctional menses; USO with TAH  . APPENDECTOMY    . BREAST EXCISIONAL BIOPSY Right 1972   NEG  . BREAST EXCISIONAL BIOPSY Left 1980'S   NEG  . BREAST SURGERY     biopsy X 2  . CHOLECYSTECTOMY N/A 04/30/2016   Procedure: LAPAROSCOPIC CHOLECYSTECTOMY;  Surgeon: Georganna Skeans, MD;  Location: [facility];  Service: General;  Laterality: N/A;  . COLONOSCOPY  X2; Tics; Dr Fuller Plan, GI  . ESOPHAGEAL DILATION     X2  . ESOPHAGOGASTRODUODENOSCOPY (EGD) WITH PROPOFOL N/A 10/19/2019   Procedure: ESOPHAGOGASTRODUODENOSCOPY (EGD) WITH PROPOFOL;  Surgeon: Mauri Pole, MD;  Location: [facility];  Service: Endoscopy;  Laterality: N/A;  . EYE SURGERY     cataracts removed  . FRACTURE SURGERY  2003or 2004  . ROTATOR CUFF REPAIR     R shoulder  . TUBAL LIGATION     befor hysterectomy  . UPPER GASTROINTESTINAL ENDOSCOPY      Prior to Admission medications   Medication Sig Start Date End Date Taking? Authorizing Provider  Alum Hydroxide-Mag Carbonate (GAVISCON PO) Take 1  capsule by mouth daily.   Yes [provider]  amLODipine (NORVASC) 5 MG tablet Take 1 tablet (5 mg total) by mouth daily. 12/14/19  Yes Crystal Mc, MD  gabapentin (NEURONTIN) 100 MG capsule TAKE 1 CAPSULE (100 MG TOTAL) BY MOUTH 3 (THREE) TIMES DAILY. EVERY 8 HOURS AS NEEDED Patient taking differently: Take 100 mg by mouth 3 (three) times daily. 04/22/19  Yes Crystal Mc, MD  hydrochlorothiazide (HYDRODIURIL) 25 MG tablet Take 1 tablet (25 mg total) by mouth daily. 10/26/19  Yes Crystal Mc, MD  hyoscyamine (ANASPAZ) 0.125 MG TBDP disintergrating tablet Dissolve under tongue before each meal 08/01/20  Yes Crystal Mc, MD  levothyroxine (SYNTHROID) 50 MCG tablet Take 1 tablet (50 mcg total) by mouth daily before breakfast. 08/05/20  Yes Crystal Mc, MD  loratadine (CLARITIN) 10 MG tablet Take 10 mg by mouth daily.   Yes [provider]  omeprazole (PRILOSEC) 40 MG capsule Take 1 capsule (40 mg total) by mouth 2 (two) times daily before a meal. 02/22/20  Yes Ladene Artist, MD  ondansetron (ZOFRAN ODT) 4 MG disintegrating tablet Take 1 tablet (4 mg total) by mouth every 8 (eight) hours as needed for nausea or vomiting. 06/09/20  Yes Crystal Mc, MD  Polyethyl Glycol-Propyl Glycol (SYSTANE OP) Place 1 drop into both eyes 4 (four) times daily as needed (dry eyes).   Yes [provider]  polyethylene glycol (MIRALAX / GLYCOLAX) 17 g packet Take 17 g by mouth daily.   Yes [provider]  RESTASIS 0.05 % ophthalmic emulsion  06/20/20  Yes [provider]  simvastatin (ZOCOR) 20 MG tablet Take 1 tablet (20 mg total) by mouth every evening. 02/04/20  Yes Crystal Mc, MD  ALPRAZolam Duanne Moron) 0.25 MG tablet 0.5 tablet 15 minutes prior to lunch and dinner as needed Patient not taking: Reported on 08/29/2020 08/01/20   Crystal Mc, MD  diltiazem (CARDIZEM) 30 MG tablet 1 TABLET 30 MINUTES PRIOR TO MEALS Patient not taking: Reported on  08/29/2020 08/28/20   Crystal Mc, MD    Family History  Problem Relation Age of Onset  . Breast cancer Sister        mammograms @ [facility]  . Colon polyps Sister   . Emphysema Father   . Cancer Mother        breast&stomach  . Stomach cancer Mother   . Breast cancer Mother   . Colon cancer Maternal Aunt   . Cancer Maternal Aunt 80       colon cA  . Coronary artery disease Brother   . COPD Brother   . Colon polyps Brother   . Diabetes Neg Hx   . Stroke Neg Hx   . Heart disease Neg Hx   . Esophageal cancer  Neg Hx   . Rectal cancer Neg Hx      Social History   Tobacco Use  . Smoking status: Never Smoker  . Smokeless tobacco: Never Used  Vaping Use  . Vaping Use: Never used  Substance Use Topics  . Alcohol use: Yes    Comment: have wine 1 glass every 6 months  . Drug use: No    Allergies as of 08/29/2020 - Review Complete 08/29/2020  Allergen Reaction Noted  . Besivance [besifloxacin hcl] Shortness Of Breath and Other (See Comments) 06/13/2014  . Alendronate sodium Other (See Comments) 05/21/2010  . Dilaudid [hydromorphone hcl] Itching 04/30/2011  . Codeine Nausea And Vomiting and Other (See Comments) 07/20/2019  . Flonase [fluticasone]  10/17/2019  . Ibandronate sodium Other (See Comments) 05/21/2010    Review of Systems:    All systems reviewed and negative except where noted in HPI.   Physical Exam:  BP 139/64   Pulse 93   Ht 5\' 2"  (1.575 m)   Wt 108 lb 3.2 oz (49.1 kg)   BMI 19.79 kg/m  No LMP recorded. Patient has had a hysterectomy. General:   Alert,  Well-developed, well-nourished, pleasant and cooperative in NAD Head:  Normocephalic and atraumatic. Eyes:  Sclera clear, no icterus.   Conjunctiva pink. Ears:  Normal auditory acuity. Neck:  Supple; no masses or thyromegaly. Lungs:  Respirations even and unlabored.  Clear throughout to auscultation.   No wheezes, crackles, or rhonchi. No acute distress. Heart:  Regular rate and rhythm; no murmurs,  clicks, rubs, or gallops. Abdomen:  Normal bowel sounds.  No bruits.  Soft, non-tender and non-distended without masses, hepatosplenomegaly or hernias noted.  No guarding or rebound tenderness.  Negative Carnett sign.   Rectal:  Deferred.  Pulses:  Normal pulses noted. Extremities:  No clubbing or edema.  No cyanosis. Neurologic:  Alert and oriented x3;  grossly normal neurologically. Skin:  Intact without significant lesions or rashes.  No jaundice. Lymph Nodes:  No significant cervical adenopathy. Psych:  Alert and cooperative. Normal mood and affect.  Imaging Studies: No results found.  Assessment and Plan:   ZAYLYN SELLARDS is a 75 y.o. y/o female who comes in with esophageal dysmotility and a history of nausea with the feeling of nausea and abdominal distention for hours after she eats. The patient will be set up for a gastric emptying study to see if there is any sign of gastroparesis as the cause of her weight loss decreased appetite and nausea.  She will also be started on a prebiotic with fiber and has been given samples of this.  She is also been told that if her stools are soft and pencil thin at times that she should back off on her MiraLAX.  The patient is coming to me for a second opinion since she does not feel like her symptoms are getting better and she continues to lose weight.  The patient has been told to try boost or ensure and try it without weakness smoothie out of it which some of the ingredients of her last movies may be causing her problems and not the protein drink.  The patient has been explained the plan and agrees with it.    Lucilla Lame, MD. Marval Regal    Note: This dictation was prepared with Dragon dictation along with smaller phrase technology. Any transcriptional errors that result from this process are unintentional.

## 2020-09-07 ENCOUNTER — Telehealth: Payer: Self-pay | Admitting: Gastroenterology

## 2020-09-07 NOTE — Telephone Encounter (Signed)
Pt advised she will be fed eggs for the gastric emptying study.

## 2020-09-07 NOTE — Telephone Encounter (Signed)
Patient having empty test, she wants to know if she can eat something early that day of procedure

## 2020-09-11 ENCOUNTER — Other Ambulatory Visit: Payer: Self-pay | Admitting: Internal Medicine

## 2020-09-12 ENCOUNTER — Other Ambulatory Visit (INDEPENDENT_AMBULATORY_CARE_PROVIDER_SITE_OTHER): Payer: Medicare HMO

## 2020-09-12 ENCOUNTER — Other Ambulatory Visit: Payer: Self-pay

## 2020-09-12 DIAGNOSIS — E034 Atrophy of thyroid (acquired): Secondary | ICD-10-CM

## 2020-09-12 LAB — TSH: TSH: 7.46 u[IU]/mL — ABNORMAL HIGH (ref 0.35–4.50)

## 2020-09-13 ENCOUNTER — Telehealth: Payer: Self-pay | Admitting: Internal Medicine

## 2020-09-13 MED ORDER — LEVOTHYROXINE SODIUM 50 MCG PO TABS: 50.0000 ug | ORAL_TABLET | Freq: Every day | ORAL | 1 refills | Status: DC

## 2020-09-13 NOTE — Assessment & Plan Note (Signed)
Thyroid was overactive on 75 mcg daily (525 mcg weekly)  and underactive on 50 mcg daily (350 mcg weekly ) . Advised to increase dose to 100 mcg on sundays and Wednesdays,  50 mcg all other days (total weekly dose 450 mcg)

## 2020-09-13 NOTE — Telephone Encounter (Signed)
Patient called in about her results for labs for Thyroid

## 2020-09-13 NOTE — Addendum Note (Signed)
Addended by: [name] on: 09/13/2020 12:34 PM   Modules accepted: Orders

## 2020-09-22 ENCOUNTER — Encounter
Admission: RE | Admit: 2020-09-22 | Discharge: 2020-09-22 | Disposition: A | Discharge status: Home / Self Care | Payer: Medicare HMO | Source: Ambulatory Visit | Attending: Gastroenterology | Admitting: Gastroenterology

## 2020-09-22 ENCOUNTER — Other Ambulatory Visit: Payer: Self-pay

## 2020-09-22 DIAGNOSIS — R112 Nausea with vomiting, unspecified: Secondary | ICD-10-CM | POA: Diagnosis not present

## 2020-09-22 MED ORDER — TECHNETIUM TC 99M SULFUR COLLOID
2.1200 | Freq: Once | INTRAVENOUS | Status: AC | PRN
  Administered 2020-09-22: 2.12 via INTRAVENOUS

## 2020-09-25 ENCOUNTER — Telehealth: Payer: Self-pay

## 2020-09-25 NOTE — Telephone Encounter (Signed)
-----   Message from Lucilla Lame, MD sent at 09/24/2020  1:52 PM EST ----- Let The patient know that her gastric emptying study was normal.

## 2020-09-25 NOTE — Telephone Encounter (Signed)
Pt notified of gastric emptying study results through mychart.

## 2020-09-26 DIAGNOSIS — H04123 Dry eye syndrome of bilateral lacrimal glands: Secondary | ICD-10-CM | POA: Diagnosis not present

## 2020-09-26 DIAGNOSIS — H16223 Keratoconjunctivitis sicca, not specified as Sjogren's, bilateral: Secondary | ICD-10-CM | POA: Diagnosis not present

## 2020-09-28 ENCOUNTER — Telehealth: Payer: Self-pay | Admitting: Gastroenterology

## 2020-09-28 NOTE — Telephone Encounter (Signed)
Patient called to let us know to use her cell number because her land line is being worked on. [phone]

## 2020-09-28 NOTE — Telephone Encounter (Signed)
Patient has questions regarding her procedure, please call to advise

## 2020-09-29 NOTE — Telephone Encounter (Signed)
Spoke with pt regarding follow up appt. Advised pt as long as she is doing well, we will do a 6 month follow up. Pt stated she is doing okay and will call back if symptoms returned.

## 2020-10-11 ENCOUNTER — Other Ambulatory Visit: Payer: Self-pay | Admitting: Internal Medicine

## 2020-10-25 ENCOUNTER — Other Ambulatory Visit: Payer: Self-pay

## 2020-10-25 ENCOUNTER — Other Ambulatory Visit (INDEPENDENT_AMBULATORY_CARE_PROVIDER_SITE_OTHER): Payer: Medicare HMO

## 2020-10-25 DIAGNOSIS — E034 Atrophy of thyroid (acquired): Secondary | ICD-10-CM

## 2020-10-25 LAB — TSH: TSH: 6.29 u[IU]/mL — ABNORMAL HIGH (ref 0.35–4.50)

## 2020-10-28 ENCOUNTER — Other Ambulatory Visit: Payer: Self-pay | Admitting: Internal Medicine

## 2020-10-28 DIAGNOSIS — E034 Atrophy of thyroid (acquired): Secondary | ICD-10-CM

## 2020-10-28 DIAGNOSIS — D649 Anemia, unspecified: Secondary | ICD-10-CM

## 2020-10-28 DIAGNOSIS — R634 Abnormal weight loss: Secondary | ICD-10-CM

## 2020-10-28 MED ORDER — LEVOTHYROXINE SODIUM 75 MCG PO TABS: 75.0000 ug | ORAL_TABLET | Freq: Every day | ORAL | 0 refills | Status: DC

## 2020-10-28 MED ORDER — LEVOTHYROXINE SODIUM 50 MCG PO TABS: 50.0000 ug | ORAL_TABLET | Freq: Every day | ORAL | 1 refills | Status: DC

## 2020-12-12 ENCOUNTER — Other Ambulatory Visit (INDEPENDENT_AMBULATORY_CARE_PROVIDER_SITE_OTHER): Payer: Medicare HMO

## 2020-12-12 ENCOUNTER — Other Ambulatory Visit: Payer: Self-pay

## 2020-12-12 DIAGNOSIS — D649 Anemia, unspecified: Secondary | ICD-10-CM

## 2020-12-12 DIAGNOSIS — R634 Abnormal weight loss: Secondary | ICD-10-CM

## 2020-12-12 DIAGNOSIS — E034 Atrophy of thyroid (acquired): Secondary | ICD-10-CM

## 2020-12-12 LAB — CBC WITH DIFFERENTIAL/PLATELET
Basophils Absolute: 0.1 10*3/uL (ref 0.0–0.1)
Basophils Relative: 1.8 % (ref 0.0–3.0)
Eosinophils Absolute: 0.3 10*3/uL (ref 0.0–0.7)
Eosinophils Relative: 6.1 % — ABNORMAL HIGH (ref 0.0–5.0)
HCT: 37.1 % (ref 36.0–46.0)
Hemoglobin: 12.4 g/dL (ref 12.0–15.0)
Lymphocytes Relative: 30.8 % (ref 12.0–46.0)
Lymphs Abs: 1.4 10*3/uL (ref 0.7–4.0)
MCHC: 33.3 g/dL (ref 30.0–36.0)
MCV: 88.1 fl (ref 78.0–100.0)
Monocytes Absolute: 0.6 10*3/uL (ref 0.1–1.0)
Monocytes Relative: 12.8 % — ABNORMAL HIGH (ref 3.0–12.0)
Neutro Abs: 2.2 10*3/uL (ref 1.4–7.7)
Neutrophils Relative %: 48.5 % (ref 43.0–77.0)
Platelets: 243 10*3/uL (ref 150.0–400.0)
RBC: 4.21 Mil/uL (ref 3.87–5.11)
RDW: 13.8 % (ref 11.5–15.5)
WBC: 4.5 10*3/uL (ref 4.0–10.5)

## 2020-12-12 LAB — COMPREHENSIVE METABOLIC PANEL
ALT: 10 U/L (ref 0–35)
AST: 16 U/L (ref 0–37)
Albumin: 3.9 g/dL (ref 3.5–5.2)
Alkaline Phosphatase: 88 U/L (ref 39–117)
BUN: 14 mg/dL (ref 6–23)
CO2: 31 mEq/L (ref 19–32)
Calcium: 9 mg/dL (ref 8.4–10.5)
Chloride: 103 mEq/L (ref 96–112)
Creatinine, Ser: 0.86 mg/dL (ref 0.40–1.20)
GFR: 66.05 mL/min (ref >60.00)
Glucose, Bld: 123 mg/dL — ABNORMAL HIGH (ref 70–99)
Potassium: 4 mEq/L (ref 3.5–5.1)
Sodium: 140 mEq/L (ref 135–145)
Total Bilirubin: 0.4 mg/dL (ref 0.2–1.2)
Total Protein: 6.1 g/dL (ref 6.0–8.3)

## 2020-12-13 LAB — THYROID PANEL WITH TSH
Free Thyroxine Index: 2.3 (ref 1.4–3.8)
T3 Uptake: 30 % (ref 22–35)
T4, Total: 7.6 ug/dL (ref 5.1–11.9)
TSH: 2.99 mIU/L (ref 0.40–4.50)

## 2020-12-14 ENCOUNTER — Other Ambulatory Visit: Payer: Self-pay

## 2020-12-14 ENCOUNTER — Ambulatory Visit (INDEPENDENT_AMBULATORY_CARE_PROVIDER_SITE_OTHER): Payer: Medicare HMO

## 2020-12-14 VITALS — BP 136/84 | HR 86 | Temp 98.4°F | Resp 14 | Ht 62.0 in | Wt 118.0 lb

## 2020-12-14 DIAGNOSIS — Z1231 Encounter for screening mammogram for malignant neoplasm of breast: Secondary | ICD-10-CM

## 2020-12-14 DIAGNOSIS — Z0000 Encounter for general adult medical examination without abnormal findings: Secondary | ICD-10-CM

## 2020-12-14 NOTE — Patient Instructions (Addendum)
Crystal Kidd , Thank you for taking time to come for your Medicare Wellness Visit. I appreciate your ongoing commitment to your health goals. Please review the following plan we discussed and let me know if I can assist you in the future.   These are the goals we discussed: Goals      Patient Stated   .  Weight goal 120lb (pt-stated)      Healthy diet Stay active       This is a list of the screening recommended for you and due dates:  Health Maintenance  Topic Date Due  . Mammogram  01/27/2021  . Flu Shot  03/12/2021  . Colon Cancer Screening  04/28/2023  . Tetanus Vaccine  05/11/2024  . DEXA scan (bone density measurement)  Completed  . COVID-19 Vaccine  Completed  .  Hepatitis C: One time screening is recommended by Center for Disease Control  (CDC) for  adults born from [birth year] through 1966.   Completed  . Pneumonia vaccines  Completed  . HPV Vaccine  Aged Out    Immunizations Immunization History  Administered Date(s) Administered  . Fluad Quad(high Dose 65+) 04/26/2019, 06/02/2020  . H1N1 08/31/2008  . Influenza Split 05/22/2011, 04/29/2012  . Influenza Whole 08/12/2002, 05/05/2008, 05/16/2009, 05/21/2010  . Influenza, High Dose Seasonal PF 04/12/2013, 04/09/2016, 04/10/2017, 05/25/2018  . Influenza,inj,Quad PF,6+ Mos 04/20/2014, 05/02/2015  . PFIZER(Purple Top)SARS-COV-2 Vaccination 09/01/2019, 09/20/2019, 06/23/2020  . Pneumococcal Conjugate-13 06/13/2014  . Pneumococcal Polysaccharide-23 06/21/2010, 11/30/2015  . Td 04/13/2004  . Tdap 05/11/2014  . Zoster 07/26/2009  . Zoster Recombinat (Shingrix) 06/30/2019, 12/28/2019   Cpe 02/08/21 @ 8:00  Schedule Mammogram after 01/27/21. Contact number with [facility]) [phone]  Advanced directives: End of life planning; Advance aging; Advanced directives discussed.  Copy of current HCPOA/Living Will requested.    Follow up in one year for your annual wellness visit    Preventive  Care 65 Years and Older, Female Preventive care refers to lifestyle choices and visits with your health care provider that can promote health and wellness. What does preventive care include?  A yearly physical exam. This is also called an annual well check.  Dental exams once or twice a year.  Routine eye exams. Ask your health care provider how often you should have your eyes checked.  Personal lifestyle choices, including:  Daily care of your teeth and gums.  Regular physical activity.  Eating a healthy diet.  Avoiding tobacco and drug use.  Limiting alcohol use.  Practicing safe sex.  Taking low-dose aspirin every day.  Taking vitamin and mineral supplements as recommended by your health care provider. What happens during an annual well check? The services and screenings done by your health care provider during your annual well check will depend on your age, overall health, lifestyle risk factors, and family history of disease. Counseling  Your health care provider may ask you questions about your:  Alcohol use.  Tobacco use.  Drug use.  Emotional well-being.  Home and relationship well-being.  Sexual activity.  Eating habits.  History of falls.  Memory and ability to understand (cognition).  Work and work Statistician.  Reproductive health. Screening  You may have the following tests or measurements:  Height, weight, and BMI.  Blood pressure.  Lipid and cholesterol levels. These may be checked every 5 years, or more frequently if you are over 54 years old.  Skin check.  Lung cancer screening. You may have this screening every year  starting at age 80 if you have a 30-pack-year history of smoking and currently smoke or have quit within the past 15 years.  Fecal occult blood test (FOBT) of the stool. You may have this test every year starting at age 38.  Flexible sigmoidoscopy or colonoscopy. You may have a sigmoidoscopy every 5 years or a  colonoscopy every 10 years starting at age 35.  Hepatitis C blood test.  Hepatitis B blood test.  Sexually transmitted disease (STD) testing.  Diabetes screening. This is done by checking your blood sugar (glucose) after you have not eaten for a while (fasting). You may have this done every 1-3 years.  Bone density scan. This is done to screen for osteoporosis. You may have this done starting at age 40.  Mammogram. This may be done every 1-2 years. Talk to your health care provider about how often you should have regular mammograms. Talk with your health care provider about your test results, treatment options, and if necessary, the need for more tests. Vaccines  Your health care provider may recommend certain vaccines, such as:  Influenza vaccine. This is recommended every year.  Tetanus, diphtheria, and acellular pertussis (Tdap, Td) vaccine. You may need a Td booster every 10 years.  Zoster vaccine. You may need this after age 22.  Pneumococcal 13-valent conjugate (PCV13) vaccine. One dose is recommended after age 26.  Pneumococcal polysaccharide (PPSV23) vaccine. One dose is recommended after age 53. Talk to your health care provider about which screenings and vaccines you need and how often you need them. This information is not intended to replace advice given to you by your health care provider. Make sure you discuss any questions you have with your health care provider. Document Released: 08/25/2015 Document Revised: 04/17/2016 Document Reviewed: 05/30/2015 Elsevier Interactive Patient Education  2017 [place] Prevention in the Home Falls can cause injuries. They can happen to people of all ages. There are many things you can do to make your home safe and to help prevent falls. What can I do on the outside of my home?  Regularly fix the edges of walkways and driveways and fix any cracks.  Remove anything that might make you trip as you walk through a door,  such as a raised step or threshold.  Trim any bushes or trees on the path to your home.  Use bright outdoor lighting.  Clear any walking paths of anything that might make someone trip, such as rocks or tools.  Regularly check to see if handrails are loose or broken. Make sure that both sides of any steps have handrails.  Any raised decks and porches should have guardrails on the edges.  Have any leaves, snow, or ice cleared regularly.  Use sand or salt on walking paths during winter.  Clean up any spills in your garage right away. This includes oil or grease spills. What can I do in the bathroom?  Use night lights.  Install grab bars by the toilet and in the tub and shower. Do not use towel bars as grab bars.  Use non-skid mats or decals in the tub or shower.  If you need to sit down in the shower, use a plastic, non-slip stool.  Keep the floor dry. Clean up any water that spills on the floor as soon as it happens.  Remove soap buildup in the tub or shower regularly.  Attach bath mats securely with double-sided non-slip rug tape.  Do not have throw rugs and other  things on the floor that can make you trip. What can I do in the bedroom?  Use night lights.  Make sure that you have a light by your bed that is easy to reach.  Do not use any sheets or blankets that are too big for your bed. They should not hang down onto the floor.  Have a firm chair that has side arms. You can use this for support while you get dressed.  Do not have throw rugs and other things on the floor that can make you trip. What can I do in the kitchen?  Clean up any spills right away.  Avoid walking on wet floors.  Keep items that you use a lot in easy-to-reach places.  If you need to reach something above you, use a strong step stool that has a grab bar.  Keep electrical cords out of the way.  Do not use floor polish or wax that makes floors slippery. If you must use wax, use non-skid  floor wax.  Do not have throw rugs and other things on the floor that can make you trip. What can I do with my stairs?  Do not leave any items on the stairs.  Make sure that there are handrails on both sides of the stairs and use them. Fix handrails that are broken or loose. Make sure that handrails are as long as the stairways.  Check any carpeting to make sure that it is firmly attached to the stairs. Fix any carpet that is loose or worn.  Avoid having throw rugs at the top or bottom of the stairs. If you do have throw rugs, attach them to the floor with carpet tape.  Make sure that you have a light switch at the top of the stairs and the bottom of the stairs. If you do not have them, ask someone to add them for you. What else can I do to help prevent falls?  Wear shoes that:  Do not have high heels.  Have rubber bottoms.  Are comfortable and fit you well.  Are closed at the toe. Do not wear sandals.  If you use a stepladder:  Make sure that it is fully opened. Do not climb a closed stepladder.  Make sure that both sides of the stepladder are locked into place.  Ask someone to hold it for you, if possible.  Clearly mark and make sure that you can see:  Any grab bars or handrails.  First and last steps.  Where the edge of each step is.  Use tools that help you move around (mobility aids) if they are needed. These include:  Canes.  Walkers.  Scooters.  Crutches.  Turn on the lights when you go into a dark area. Replace any light bulbs as soon as they burn out.  Set up your furniture so you have a clear path. Avoid moving your furniture around.  If any of your floors are uneven, fix them.  If there are any pets around you, be aware of where they are.  Review your medicines with your doctor. Some medicines can make you feel dizzy. This can increase your chance of falling. Ask your doctor what other things that you can do to help prevent falls. This  information is not intended to replace advice given to you by your health care provider. Make sure you discuss any questions you have with your health care provider. Document Released: 05/25/2009 Document Revised: 01/04/2016 Document Reviewed: 09/02/2014 Elsevier Interactive Patient  Education  2017 [employer].

## 2020-12-14 NOTE — Progress Notes (Signed)
Subjective:   Crystal Kidd is a 75 y.o. female who presents for Medicare Annual (Subsequent) preventive examination.  Review of Systems    No ROS.  Medicare Wellness Cardiac Risk Factors include: advanced age (>59men, >52 women)     Objective:    Today's Vitals   12/14/20 1438  BP: 136/84  Pulse: 86  Resp: 14  Temp: 98.4 F (36.9 C)  TempSrc: Oral  SpO2: 97%  Weight: 118 lb (53.5 kg)  Height: 5\' 2"  (1.575 m)   Body mass index is 21.58 kg/m.  Advanced Directives 12/14/2020 12/14/2019 10/19/2019 10/17/2019 10/01/2019 12/09/2018 12/01/2017  Does Patient Have a Medical Advance Directive? Yes No Yes Yes No;Yes No Yes  Type of Paramedic of [place];Living will - Healthcare Power of [place]  Does patient want to make changes to medical advance directive? No - Patient declined Yes (MAU/Ambulatory/Procedural Areas - Information given) No - Patient declined - - - No - Patient declined  Copy of [place] in Chart? No - copy requested - No - copy requested No - copy requested, Physician notified No - copy requested - No - copy requested  Would patient like information on creating a medical advance directive? - - No - Patient declined No - Patient declined No - Patient declined No - Patient declined -    Current Medications (verified) Outpatient Encounter Medications as of 12/14/2020  Medication Sig  . Glycerin (OASIS TEARS PLUS PF OP) Apply 1 drop to eye.  . Inulin (FIBER CHOICE PO) Take 1 tablet by mouth daily as needed.  . Alum Hydroxide-Mag Carbonate (GAVISCON PO) Take 1 capsule by mouth daily.  Marland Kitchen amLODipine (NORVASC) 5 MG tablet TAKE 1 TABLET EVERY DAY  . gabapentin (NEURONTIN) 100 MG capsule TAKE 1 CAPSULE (100 MG TOTAL) BY MOUTH 3 (THREE) TIMES DAILY. EVERY 8 HOURS AS NEEDED (Patient taking differently: Take 100 mg by mouth 3 (three) times daily.)   . hydrochlorothiazide (HYDRODIURIL) 25 MG tablet Take 1 tablet (25 mg total) by mouth daily.  Marland Kitchen levothyroxine (SYNTHROID) 75 MCG tablet Take 1 tablet (75 mcg total) by mouth daily before breakfast.  . loratadine (CLARITIN) 10 MG tablet Take 10 mg by mouth daily.  Marland Kitchen omeprazole (PRILOSEC) 40 MG capsule Take 1 capsule (40 mg total) by mouth 2 (two) times daily before a meal.  . ondansetron (ZOFRAN ODT) 4 MG disintegrating tablet Take 1 tablet (4 mg total) by mouth every 8 (eight) hours as needed for nausea or vomiting.  Vladimir Faster Glycol-Propyl Glycol (SYSTANE OP) Place 1 drop into both eyes 4 (four) times daily as needed (dry eyes).  . polyethylene glycol (MIRALAX / GLYCOLAX) 17 g packet Take 17 g by mouth daily.  . simvastatin (ZOCOR) 20 MG tablet Take 1 tablet (20 mg total) by mouth every evening.  . [DISCONTINUED] ALPRAZolam (XANAX) 0.25 MG tablet 0.5 tablet 15 minutes prior to lunch and dinner as needed (Patient not taking: Reported on 08/29/2020)  . [DISCONTINUED] diltiazem (CARDIZEM) 30 MG tablet 1 TABLET 30 MINUTES PRIOR TO MEALS  . [DISCONTINUED] hyoscyamine (ANASPAZ) 0.125 MG TBDP disintergrating tablet Dissolve under tongue before each meal  . [DISCONTINUED] RESTASIS 0.05 % ophthalmic emulsion    No facility-administered encounter medications on file as of 12/14/2020.    Allergies (verified) Besivance [besifloxacin hcl], Alendronate sodium, Dilaudid [hydromorphone hcl], Codeine, Flonase [fluticasone], and Ibandronate sodium   History: Past Medical History:  Diagnosis Date  . Allergic rhinoconjunctivitis   . Allergy 1992  . Arthritis   . Cataract   . Diverticulosis   . Eosinophilic esophagitis   . GERD (gastroesophageal reflux disease)    PMH of esophageal stricture  . Hyperlipidemia   . Hypertension   . Osteopenia    BMD @[facility]   . Thyroid disease    hypothyroidism  . Transfusion history 1968   post partum  . Trapezius muscle spasm 04/13/2015  . Vitiligo    Past Surgical  History:  Procedure Laterality Date  . ABDOMINAL HYSTERECTOMY     for dysfunctional menses; USO with TAH  . APPENDECTOMY    . BREAST EXCISIONAL BIOPSY Right 1972   NEG  . BREAST EXCISIONAL BIOPSY Left 1980'S   NEG  . BREAST SURGERY     biopsy X 2  . CHOLECYSTECTOMY N/A 04/30/2016   Procedure: LAPAROSCOPIC CHOLECYSTECTOMY;  Surgeon: Georganna Skeans, MD;  Location: [facility];  Service: General;  Laterality: N/A;  . COLONOSCOPY     X2; Tics; Dr Fuller Plan, GI  . ESOPHAGEAL DILATION     X2  . ESOPHAGOGASTRODUODENOSCOPY (EGD) WITH PROPOFOL N/A 10/19/2019   Procedure: ESOPHAGOGASTRODUODENOSCOPY (EGD) WITH PROPOFOL;  Surgeon: Mauri Pole, MD;  Location: [facility];  Service: Endoscopy;  Laterality: N/A;  . EYE SURGERY     cataracts removed  . FRACTURE SURGERY  2003or 2004  . ROTATOR CUFF REPAIR     R shoulder  . TUBAL LIGATION     befor hysterectomy  . UPPER GASTROINTESTINAL ENDOSCOPY     Family History  Problem Relation Age of Onset  . Breast cancer Sister        mammograms @ [facility]  . Colon polyps Sister   . Emphysema Father   . Cancer Mother        breast&stomach  . Stomach cancer Mother   . Breast cancer Mother   . Colon cancer Maternal Aunt   . Cancer Maternal Aunt 80       colon cA  . Coronary artery disease Brother   . COPD Brother   . Colon polyps Brother   . Diabetes Neg Hx   . Stroke Neg Hx   . Heart disease Neg Hx   . Esophageal cancer Neg Hx   . Rectal cancer Neg Hx    Social History   Socioeconomic History  . Marital status: Widowed    Spouse name: Not on file  . Number of children: Not on file  . Years of education: Not on file  . Highest education level: Not on file  Occupational History  . Not on file  Tobacco Use  . Smoking status: Never Smoker  . Smokeless tobacco: Never Used  Vaping Use  . Vaping Use: Never used  Substance and Sexual Activity  . Alcohol use: Yes    Comment: have wine 1 glass every 6 months  . Drug use: No  . Sexual  activity: Not Currently  Other Topics Concern  . Not on file  Social History Narrative  . Not on file   Social Determinants of Health   Financial Resource Strain: Low Risk   . Difficulty of Paying Living Expenses: Not hard at all  Food Insecurity: No Food Insecurity  . Worried About Charity fundraiser in the Last Year: Never true  . Ran Out of Food in the Last Year: Never true  Transportation Needs: No Transportation Needs  . Lack of Transportation (Medical): No  . Lack of  Transportation (Non-Medical): No  Physical Activity: Sufficiently Active  . Days of Exercise per Week: 5 days  . Minutes of Exercise per Session: 30 min  Stress: No Stress Concern Present  . Feeling of Stress : Not at all  Social Connections: Unknown  . Frequency of Communication with Friends and Family: More than three times a week  . Frequency of Social Gatherings with Friends and Family: More than three times a week  . Attends Religious Services: Not on file  . Active Member of Clubs or Organizations: Not on file  . Attends Archivist Meetings: Not on file  . Marital Status: Widowed    Tobacco Counseling Counseling given: Not Answered   Clinical Intake:  Pre-visit preparation completed: Yes        Diabetes: No  How often do you need to have someone help you when you read instructions, pamphlets, or other written materials from your doctor or pharmacy?: 1 - Never    Interpreter Needed?: No      Activities of Daily Living In your present state of health, do you have any difficulty performing the following activities: 12/14/2020  Hearing? N  Vision? N  Difficulty concentrating or making decisions? N  Walking or climbing stairs? N  Dressing or bathing? N  Doing errands, shopping? N  Preparing Food and eating ? N  Using the Toilet? N  In the past six months, have you accidently leaked urine? N  Do you have problems with loss of bowel control? N  Managing your Medications? N   Managing your Finances? N  Housekeeping or managing your Housekeeping? N  Some recent data might be hidden    Patient Care Team: [name], MD as PCP - General (Internal Medicine)  Indicate any recent Medical Services you may have received from other than [facility] providers in the past year (date may be approximate).     Assessment:   This is a routine wellness examination for Crystal Kidd.  Hearing/Vision screen  Hearing Screening   125Hz  250Hz  500Hz  1000Hz  2000Hz  3000Hz  4000Hz  6000Hz  8000Hz   Right ear:           Left ear:           Comments: Patient is able to hear conversational tones without difficulty. No issues reported.  Vision Screening Comments: Followed by Dr. Posey Pronto Wears reader lenses Cataract extraction, bilateral Visual acuity not assessed, virtual visit.  They have seen their ophthalmologist in the last 12 months.     Dietary issues and exercise activities discussed: Current Exercise Habits: Home exercise routine, Type of exercise: walking, Time (Minutes): 30, Frequency (Times/Week): 5, Weekly Exercise (Minutes/Week): 150, Intensity: Mild  Healthy diet Good water intake  Goals Addressed              This Visit's Progress     Patient Stated   .  Weight goal 120lb (pt-stated)        Healthy diet Stay active      Depression Screen PHQ 2/9 Scores 12/14/2020 12/14/2019 12/09/2018 12/01/2017 11/29/2016 08/09/2016 11/30/2015  PHQ - 2 Score 0 0 0 0 0 0 0    Fall Risk Fall Risk  12/14/2020 08/01/2020 01/31/2020 12/28/2019 12/14/2019  Falls in the past year? 0 0 0 0 0  Number falls in past yr: 0 - - - -  Injury with Fall? 0 - - - -  Follow up Falls evaluation completed Falls evaluation completed Falls evaluation completed Falls evaluation completed Falls evaluation completed  FALL RISK PREVENTION PERTAINING TO THE HOME: Handrails in use when climbing stairs? Yes Home free of loose throw rugs in walkways, pet beds, electrical cords, etc? Yes  Adequate lighting in  your home to reduce risk of falls? Yes   ASSISTIVE DEVICES UTILIZED TO PREVENT FALLS:  Life alert? No  Use of a cane, walker or w/c? No   TIMED UP AND GO: Was the test performed? Yes .  Length of time to ambulate 10 feet: 10 sec.  Gait steady and fast without use of assistive device  Cognitive Function: Patient is alert and oriented x3.  Denies difficulty focusing, making decisions, memory loss.  Enjoys coloring, reading and playing games for brain health. MMSE/6CIT deferred. Normal by direct communication/observation.   MMSE - Mini Mental State Exam 12/14/2019 12/01/2017 11/29/2016  Not completed: Unable to complete - -  Orientation to time - 5 5  Orientation to Place - 5 5  Registration - 3 3  Attention/ Calculation - 5 5  Recall - 3 3  Language- name 2 objects - 2 2  Language- repeat - 1 1  Language- follow 3 step command - 3 3  Language- read & follow direction - 1 1  Write a sentence - 1 1  Copy design - 1 1  Total score - 30 30     6CIT Screen 12/14/2020 12/09/2018  What Year? 0 points 0 points  What month? 0 points 0 points  What time? 0 points 0 points  Count back from 20 - 0 points  Months in reverse - 0 points  Repeat phrase - 0 points  Total Score - 0    Immunizations Immunization History  Administered Date(s) Administered  . Fluad Quad(high Dose 65+) 04/26/2019, 06/02/2020  . H1N1 08/31/2008  . Influenza Split 05/22/2011, 04/29/2012  . Influenza Whole 08/12/2002, 05/05/2008, 05/16/2009, 05/21/2010  . Influenza, High Dose Seasonal PF 04/12/2013, 04/09/2016, 04/10/2017, 05/25/2018  . Influenza,inj,Quad PF,6+ Mos 04/20/2014, 05/02/2015  . PFIZER(Purple Top)SARS-COV-2 Vaccination 09/01/2019, 09/20/2019, 06/23/2020  . Pneumococcal Conjugate-13 06/13/2014  . Pneumococcal Polysaccharide-23 06/21/2010, 11/30/2015  . Td 04/13/2004  . Tdap 05/11/2014  . Zoster 07/26/2009  . Zoster Recombinat (Shingrix) 06/30/2019, 12/28/2019    Health Maintenance There are no  preventive care reminders to display for this patient. Health Maintenance  Topic Date Due  . MAMMOGRAM  01/27/2021  . INFLUENZA VACCINE  03/12/2021  . COLONOSCOPY (Pts 45-69yrs Insurance coverage will need to be confirmed)  04/28/2023  . TETANUS/TDAP  05/11/2024  . DEXA SCAN  Completed  . COVID-19 Vaccine  Completed  . Hepatitis C Screening  Completed  . PNA vac Low Risk Adult  Completed  . HPV VACCINES  Aged Out   Colorectal cancer screening: Type of screening: Colonoscopy. Completed 04/27/20. Repeat every 3 years  Mammogram status: Completed 01/28/20. Repeat every year  Lung Cancer Screening: (Low Dose CT Chest recommended if Age 65-80 years, 30 pack-year currently smoking OR have quit w/in 15years.) does not qualify.   Vision Screening: Recommended annual ophthalmology exams for early detection of glaucoma and other disorders of the eye. Is the patient up to date with their annual eye exam?  Yes   Dental Screening: Recommended annual dental exams for proper oral hygiene.  Community Resource Referral / Chronic Care Management: CRR required this visit?  No   CCM required this visit?  No      Plan:   Keep all routine maintenance appointments.   Cpe 02/08/21 @ 8:00  I have personally reviewed  and noted the following in the patient's chart:   . Medical and social history . Use of alcohol, tobacco or illicit drugs  . Current medications and supplements including opioid prescriptions.  . Functional ability and status . Nutritional status . Physical activity . Advanced directives . List of other physicians . Hospitalizations, surgeries, and ER visits in previous 12 months . Vitals . Screenings to include cognitive, depression, and falls . Referrals and appointments  In addition, I have reviewed and discussed with patient certain preventive protocols, quality metrics, and best practice recommendations. A written personalized care plan for preventive services as well as  general preventive health recommendations were provided to patient.     Varney Biles, LPN   [phone]

## 2020-12-15 ENCOUNTER — Other Ambulatory Visit: Payer: Self-pay | Admitting: Internal Medicine

## 2020-12-15 DIAGNOSIS — Z1231 Encounter for screening mammogram for malignant neoplasm of breast: Secondary | ICD-10-CM

## 2020-12-25 ENCOUNTER — Other Ambulatory Visit: Payer: Self-pay | Admitting: Gastroenterology

## 2020-12-25 ENCOUNTER — Other Ambulatory Visit: Payer: Self-pay | Admitting: Internal Medicine

## 2020-12-27 ENCOUNTER — Telehealth: Payer: Self-pay | Admitting: Gastroenterology

## 2020-12-27 ENCOUNTER — Telehealth: Payer: Self-pay | Admitting: Internal Medicine

## 2020-12-27 NOTE — Telephone Encounter (Signed)
She just had her thyroid checked 2 weeks ago and it was normal.  It does not need to be checked again so soon, but we can check it next month if symptoms persist

## 2020-12-27 NOTE — Telephone Encounter (Signed)
Left message for patient to return call back.  

## 2020-12-27 NOTE — Telephone Encounter (Signed)
Patient would like call back for suggestions on what she can take for excessive belching.

## 2020-12-27 NOTE — Telephone Encounter (Signed)
Pt called and wanted to know If she needed to have her TSH checked again because she is feeling like she did back in Dec when her medication had to be changed  Pt would give anymore info just wanted a call back from a nurse

## 2020-12-27 NOTE — Telephone Encounter (Signed)
Patient stated she is feeling full and hasn't had weight change like before. She has appointment next month and wondered if she can wait for labs. Her sx got better but a week ago some of her sx are returning. Feeling full, bloated, and slight nausea. She has had to start taking nausea medication again. She is worrying if she is becoming overactive again.

## 2020-12-27 NOTE — Telephone Encounter (Signed)
LVM for pt to return my call.

## 2020-12-28 ENCOUNTER — Other Ambulatory Visit: Payer: Self-pay

## 2020-12-28 MED ORDER — PANTOPRAZOLE SODIUM 40 MG PO TBEC: 40.0000 mg | DELAYED_RELEASE_TABLET | Freq: Every day | ORAL | 1 refills | Status: DC

## 2020-12-28 NOTE — Telephone Encounter (Signed)
Returned pt's call and sent Pantoprazole 40mg  to [facility]

## 2020-12-28 NOTE — Telephone Encounter (Signed)
Patient Crystal Kidd she was returning your call.

## 2020-12-28 NOTE — Telephone Encounter (Signed)
Patient has been informed.

## 2021-01-02 ENCOUNTER — Telehealth: Payer: Self-pay | Admitting: Gastroenterology

## 2021-01-02 NOTE — Telephone Encounter (Signed)
Patient has a question about a new medication. Clinical staff will follow up with patient.

## 2021-01-02 NOTE — Telephone Encounter (Signed)
LVM for pt to return my call.

## 2021-01-03 NOTE — Telephone Encounter (Signed)
Pt called stating the Pantoprazole per the Pharmacist, it can make her magnesium low. She is also taking HCTZ that she was told interacted. Please advise.

## 2021-01-04 NOTE — Telephone Encounter (Signed)
Pt notified. Pt stated she was not having any issues with the Omeprazole. She will go ahead and start the Pantoprazole.

## 2021-01-12 ENCOUNTER — Telehealth: Payer: Self-pay | Admitting: Internal Medicine

## 2021-01-12 NOTE — Telephone Encounter (Signed)
Patient called and stated that her left breast has red dots that look like bug bites and her nipple is red. She would like someone to check them out. They do not hurt or itch at this time. She said she did not want to go to urgent care she rather come into office next week, appointment made for 01/15/2021 at 7:30am with [facility].

## 2021-01-12 NOTE — Telephone Encounter (Signed)
FYI

## 2021-01-15 ENCOUNTER — Encounter: Payer: Self-pay | Admitting: Family Medicine

## 2021-01-15 ENCOUNTER — Other Ambulatory Visit: Payer: Self-pay

## 2021-01-15 ENCOUNTER — Ambulatory Visit (INDEPENDENT_AMBULATORY_CARE_PROVIDER_SITE_OTHER): Payer: Medicare HMO | Admitting: Family Medicine

## 2021-01-15 ENCOUNTER — Ambulatory Visit: Payer: Medicare HMO | Admitting: Adult Health

## 2021-01-15 DIAGNOSIS — L989 Disorder of the skin and subcutaneous tissue, unspecified: Secondary | ICD-10-CM

## 2021-01-15 DIAGNOSIS — N644 Mastodynia: Secondary | ICD-10-CM | POA: Diagnosis not present

## 2021-01-15 NOTE — Assessment & Plan Note (Addendum)
Her description would seem to indicate a folliculitis or pimple-like lesion or bug bite.  All of these lesions have been improving per her report.  At this time I do not think any specific intervention is needed for the skin lesions unless they recur or do not continue to improve.

## 2021-01-15 NOTE — Progress Notes (Signed)
Tommi Rumps, MD Phone: [phone]  Crystal Kidd is a 75 y.o. female who presents today for same day visit.   Rash: Patient notes onset of rash about a week ago.  She developed a rash on the left lateral breast to lesions that was itching and fullness.  She also developed one on her right flank.  On Friday she developed one on her nipple on the left that was red.  She also noted some redness under her left eye today.  She notes the redness on her nipple has improved some today and the other lesions have been improving as well.  She does work outside though notes no bug bites.  She has not had any fevers.  Social History   Tobacco Use  Smoking Status Never Smoker  Smokeless Tobacco Never Used    Current Outpatient Medications on File Prior to Visit  Medication Sig Dispense Refill  . Alum Hydroxide-Mag Carbonate (GAVISCON PO) Take 1 capsule by mouth daily.    Marland Kitchen amLODipine (NORVASC) 5 MG tablet TAKE 1 TABLET EVERY DAY 90 tablet 3  . gabapentin (NEURONTIN) 100 MG capsule TAKE 1 CAPSULE (100 MG TOTAL) BY MOUTH 3 (THREE) TIMES DAILY. EVERY 8 HOURS AS NEEDED (Patient taking differently: Take 100 mg by mouth 3 (three) times daily.) 90 capsule 1  . Glycerin (OASIS TEARS PLUS PF OP) Apply 1 drop to eye.    . hydrochlorothiazide (HYDRODIURIL) 25 MG tablet Take 1 tablet (25 mg total) by mouth daily. 90 tablet 1  . Inulin (FIBER CHOICE PO) Take 1 tablet by mouth daily as needed.    Marland Kitchen levothyroxine (SYNTHROID) 75 MCG tablet Take 1 tablet (75 mcg total) by mouth daily before breakfast. 90 tablet 0  . loratadine (CLARITIN) 10 MG tablet Take 10 mg by mouth daily.    . ondansetron (ZOFRAN ODT) 4 MG disintegrating tablet Take 1 tablet (4 mg total) by mouth every 8 (eight) hours as needed for nausea or vomiting. 20 tablet 3  . pantoprazole (PROTONIX) 40 MG tablet Take 1 tablet (40 mg total) by mouth daily. 90 tablet 1  . Polyethyl Glycol-Propyl Glycol (SYSTANE OP) Place 1 drop into both eyes 4 (four)  times daily as needed (dry eyes).    . polyethylene glycol (MIRALAX / GLYCOLAX) 17 g packet Take 17 g by mouth daily.    . simvastatin (ZOCOR) 20 MG tablet TAKE 1 TABLET (20 MG TOTAL) BY MOUTH EVERY EVENING. 90 tablet 3   No current facility-administered medications on file prior to visit.     ROS see history of present illness  Objective  Physical Exam Vitals:   01/15/21 1145  BP: 120/70  Pulse: 74  Temp: 98 F (36.7 C)  SpO2: 98%    BP Readings from Last 3 Encounters:  01/15/21 120/70  12/14/20 136/84  08/29/20 139/64   Wt Readings from Last 3 Encounters:  01/15/21 118 lb 3.2 oz (53.6 kg)  12/14/20 118 lb (53.5 kg)  08/29/20 108 lb 3.2 oz (49.1 kg)    Physical Exam Chest:       Comments: No breast masses bilaterally, no other changes to her breasts other than outlined, no nipple inversion, no axillary masses bilaterally Skin:          Assessment/Plan: Please see individual problem list.  Problem List Items Addressed This Visit    Skin lesion    Her description would seem to indicate a folliculitis or pimple-like lesion or bug bite.  All of these lesions have  been improving per her report.  At this time I do not think any specific intervention is needed for the skin lesions unless they recur or do not continue to improve.      Breast tenderness    The patient had tenderness on exam around the 9:00 location.  We will obtain a diagnostic mammogram and ultrasound to rule out an underlying cause.      Relevant Orders   MM DIAG BREAST TOMO BILATERAL   US BREAST LTD UNI LEFT INC AXILLA      Return if symptoms worsen or fail to improve.  This visit occurred during the SARS-CoV-2 public health emergency.  Safety protocols were in place, including screening questions prior to the visit, additional usage of staff PPE, and extensive cleaning of exam room while observing appropriate contact time as indicated for disinfecting solutions.    Tommi Rumps,  MD [place]

## 2021-01-15 NOTE — Assessment & Plan Note (Signed)
The patient had tenderness on exam around the 9:00 location.  We will obtain a diagnostic mammogram and ultrasound to rule out an underlying cause.

## 2021-01-15 NOTE — Patient Instructions (Signed)
Nice to see you. We will get a diagnostic mammogram to ensure the tenderness in your breast is not related to an underlying lesion. I suspect the skin lesions are related to bug bites or a folliculitis like lesion that have been improving.  She will let us know if they do not resolve or if they worsen.

## 2021-01-16 ENCOUNTER — Ambulatory Visit: Payer: Medicare HMO | Admitting: Adult Health

## 2021-01-18 ENCOUNTER — Ambulatory Visit
Admission: RE | Admit: 2021-01-18 | Discharge: 2021-01-18 | Disposition: A | Discharge status: Home / Self Care | Payer: Medicare HMO | Source: Ambulatory Visit | Attending: Family Medicine | Admitting: Family Medicine

## 2021-01-18 ENCOUNTER — Other Ambulatory Visit: Payer: Self-pay

## 2021-01-18 DIAGNOSIS — N644 Mastodynia: Secondary | ICD-10-CM | POA: Diagnosis not present

## 2021-01-18 DIAGNOSIS — R922 Inconclusive mammogram: Secondary | ICD-10-CM | POA: Diagnosis not present

## 2021-01-23 ENCOUNTER — Ambulatory Visit (INDEPENDENT_AMBULATORY_CARE_PROVIDER_SITE_OTHER): Payer: Medicare HMO | Admitting: Gastroenterology

## 2021-01-23 ENCOUNTER — Encounter: Payer: Self-pay | Admitting: Gastroenterology

## 2021-01-23 ENCOUNTER — Other Ambulatory Visit: Payer: Self-pay | Admitting: Internal Medicine

## 2021-01-23 ENCOUNTER — Other Ambulatory Visit: Payer: Self-pay

## 2021-01-23 VITALS — BP 166/72 | HR 86 | Ht 62.0 in | Wt 121.2 lb

## 2021-01-23 DIAGNOSIS — R1319 Other dysphagia: Secondary | ICD-10-CM | POA: Diagnosis not present

## 2021-01-23 NOTE — Progress Notes (Signed)
Primary Care Physician: [name], MD  Primary Gastroenterologist:  Dr. Lucilla Lame  Chief Complaint  Patient presents with  . Follow-up    HPI: Crystal Kidd is a 75 y.o. female here for follow-up of dysphagia and belching.  The patient had a barium swallow that showed the patient swallowing air with the air-filled esophagus.  The patient also had a modified barium swallow that showed her to have esophageal dysmotility.  There is no report of any unexplained weight loss and the patient states she has been actually gaining weight.  The patient has been doing well recently and states that a lot of her problems were attributed to her thyroid.  She continues to have episodes of burping a lot and in between those times she is fine.  She does state that if she does not chew her food she feels like it gets stuck and she has to bring it back up.  Past Medical History:  Diagnosis Date  . Allergic rhinoconjunctivitis   . Allergy 1992  . Arthritis   . Cataract   . Diverticulosis   . Eosinophilic esophagitis   . GERD (gastroesophageal reflux disease)    PMH of esophageal stricture  . Hyperlipidemia   . Hypertension   . Osteopenia    BMD @[facility]   . Thyroid disease    hypothyroidism  . Transfusion history 1968   post partum  . Trapezius muscle spasm 04/13/2015  . Vitiligo     Current Outpatient Medications  Medication Sig Dispense Refill  . Alum Hydroxide-Mag Carbonate (GAVISCON PO) Take 1 capsule by mouth daily.    Marland Kitchen amLODipine (NORVASC) 5 MG tablet TAKE 1 TABLET EVERY DAY 90 tablet 3  . gabapentin (NEURONTIN) 100 MG capsule TAKE 1 CAPSULE (100 MG TOTAL) BY MOUTH 3 (THREE) TIMES DAILY. EVERY 8 HOURS AS NEEDED (Patient taking differently: Take 100 mg by mouth 3 (three) times daily.) 90 capsule 1  . Glycerin (OASIS TEARS PLUS PF OP) Apply 1 drop to eye.    . hydrochlorothiazide (HYDRODIURIL) 25 MG tablet Take 1 tablet (25 mg total) by mouth daily. 90 tablet 1  . Inulin (FIBER  CHOICE PO) Take 1 tablet by mouth daily as needed.    . loratadine (CLARITIN) 10 MG tablet Take 10 mg by mouth daily.    . ondansetron (ZOFRAN ODT) 4 MG disintegrating tablet Take 1 tablet (4 mg total) by mouth every 8 (eight) hours as needed for nausea or vomiting. 20 tablet 3  . pantoprazole (PROTONIX) 40 MG tablet Take 1 tablet (40 mg total) by mouth daily. 90 tablet 1  . Polyethyl Glycol-Propyl Glycol (SYSTANE OP) Place 1 drop into both eyes 4 (four) times daily as needed (dry eyes).    . polyethylene glycol (MIRALAX / GLYCOLAX) 17 g packet Take 17 g by mouth daily.    . simvastatin (ZOCOR) 20 MG tablet TAKE 1 TABLET (20 MG TOTAL) BY MOUTH EVERY EVENING. 90 tablet 3  . levothyroxine (SYNTHROID) 75 MCG tablet TAKE 1 TABLET BY MOUTH DAILY BEFORE BREAKFAST. 90 tablet 0   No current facility-administered medications for this visit.    Allergies as of 01/23/2021 - Review Complete 01/23/2021  Allergen Reaction Noted  . Besivance [besifloxacin hcl] Shortness Of Breath and Other (See Comments) 06/13/2014  . Alendronate sodium Other (See Comments) 05/21/2010  . Dilaudid [hydromorphone hcl] Itching 04/30/2011  . Codeine Nausea And Vomiting and Other (See Comments) 07/20/2019  . Flonase [fluticasone]  10/17/2019  . Ibandronate sodium Other (  See Comments) 05/21/2010    ROS:  General: Negative for anorexia, weight loss, fever, chills, fatigue, weakness. ENT: Negative for hoarseness, difficulty swallowing , nasal congestion. CV: Negative for chest pain, angina, palpitations, dyspnea on exertion, peripheral edema.  Respiratory: Negative for dyspnea at rest, dyspnea on exertion, cough, sputum, wheezing.  GI: See history of present illness. GU:  Negative for dysuria, hematuria, urinary incontinence, urinary frequency, nocturnal urination.  Endo: Negative for unusual weight change.    Physical Examination:   BP (!) 166/72   Pulse 86   Ht 5\' 2"  (1.575 m)   Wt 121 lb 3.2 oz (55 kg)   BMI 22.17  kg/m   General: Well-nourished, well-developed in no acute distress.  Eyes: No icterus. Conjunctivae pink. Neuro: Alert and oriented x 3.  Grossly intact. Psych: Alert and cooperative, normal mood and affect.  Labs:    Imaging Studies: US BREAST LTD UNI LEFT INC AXILLA  Result Date: 01/18/2021 CLINICAL DATA:  Focal LEFT breast pain EXAM: DIGITAL DIAGNOSTIC BILATERAL MAMMOGRAM WITH TOMOSYNTHESIS AND CAD; ULTRASOUND LEFT BREAST LIMITED TECHNIQUE: Bilateral digital diagnostic mammography and breast tomosynthesis was performed. The images were evaluated with computer-aided detection.; Targeted ultrasound examination of the left breast was performed COMPARISON:  Previous exam(s). ACR Breast Density Category c: The breast tissue is heterogeneously dense, which may obscure small masses. FINDINGS: Spot compression tomosynthesis views were obtained over the area of focal pain in the LEFT breast. No suspicious mammographic finding is identified in this area. No suspicious mass, microcalcification, or other finding is identified in either breast. On physical exam, no suspicious mass is appreciated. Targeted LEFT breast ultrasound was performed in the area of pain at the inner breast. No suspicious solid or cystic mass is identified. Prominent costochondral cartilage is noted at the site of painful concern. IMPRESSION: 1. No mammographic or sonographic evidence of malignancy at the site of painful concern in the LEFT breast. Any further workup of the patient's symptoms should be based on the clinical assessment. Recommend routine annual screening mammogram in 1 year. 2. No mammographic evidence of malignancy bilaterally. RECOMMENDATION: Screening mammogram in one year.(Code:SM-B-01Y) I have discussed the findings and recommendations with the patient. If applicable, a reminder letter will be sent to the patient regarding the next appointment. BI-RADS CATEGORY  1: Negative. Electronically Signed   By: Valentino Saxon MD   On: 01/18/2021 10:20  MM DIAG BREAST TOMO BILATERAL  Result Date: 01/18/2021 CLINICAL DATA:  Focal LEFT breast pain EXAM: DIGITAL DIAGNOSTIC BILATERAL MAMMOGRAM WITH TOMOSYNTHESIS AND CAD; ULTRASOUND LEFT BREAST LIMITED TECHNIQUE: Bilateral digital diagnostic mammography and breast tomosynthesis was performed. The images were evaluated with computer-aided detection.; Targeted ultrasound examination of the left breast was performed COMPARISON:  Previous exam(s). ACR Breast Density Category c: The breast tissue is heterogeneously dense, which may obscure small masses. FINDINGS: Spot compression tomosynthesis views were obtained over the area of focal pain in the LEFT breast. No suspicious mammographic finding is identified in this area. No suspicious mass, microcalcification, or other finding is identified in either breast. On physical exam, no suspicious mass is appreciated. Targeted LEFT breast ultrasound was performed in the area of pain at the inner breast. No suspicious solid or cystic mass is identified. Prominent costochondral cartilage is noted at the site of painful concern. IMPRESSION: 1. No mammographic or sonographic evidence of malignancy at the site of painful concern in the LEFT breast. Any further workup of the patient's symptoms should be based on the clinical assessment. Recommend routine  annual screening mammogram in 1 year. 2. No mammographic evidence of malignancy bilaterally. RECOMMENDATION: Screening mammogram in one year.(Code:SM-B-01Y) I have discussed the findings and recommendations with the patient. If applicable, a reminder letter will be sent to the patient regarding the next appointment. BI-RADS CATEGORY  1: Negative. Electronically Signed   By: Valentino Saxon MD   On: 01/18/2021 10:20   Assessment and Plan:   Crystal Kidd is a 75 y.o. y/o female who comes in for a follow-up of a history of burping with dysphagia.  The patient states that her symptoms have  improved greatly since having her thyroid levels normalized.  She does report that food gets stuck sometimes when she eats large bites.  She still has episodes of burping and she has been told that it is likely from her swallowing large amounts of air as seen on her barium swallow.  The patient will make note of when she is having more burping and see if she can avoid swallowing air now that she is cognizant of it.  The patient has been explained the plan and agrees with it.     Lucilla Lame, MD. Marval Regal    Note: This dictation was prepared with Dragon dictation along with smaller phrase technology. Any transcriptional errors that result from this process are unintentional.

## 2021-02-02 ENCOUNTER — Encounter: Payer: Medicare HMO | Admitting: Internal Medicine

## 2021-02-08 ENCOUNTER — Other Ambulatory Visit: Payer: Self-pay

## 2021-02-08 ENCOUNTER — Encounter: Payer: Self-pay | Admitting: Internal Medicine

## 2021-02-08 ENCOUNTER — Ambulatory Visit (INDEPENDENT_AMBULATORY_CARE_PROVIDER_SITE_OTHER): Payer: Medicare HMO | Admitting: Internal Medicine

## 2021-02-08 VITALS — BP 128/72 | HR 70 | Temp 96.7°F | Resp 15 | Ht 62.0 in | Wt 119.6 lb

## 2021-02-08 DIAGNOSIS — Z0000 Encounter for general adult medical examination without abnormal findings: Secondary | ICD-10-CM | POA: Diagnosis not present

## 2021-02-08 DIAGNOSIS — Z23 Encounter for immunization: Secondary | ICD-10-CM

## 2021-02-08 DIAGNOSIS — I700 Atherosclerosis of aorta: Secondary | ICD-10-CM

## 2021-02-08 DIAGNOSIS — E034 Atrophy of thyroid (acquired): Secondary | ICD-10-CM

## 2021-02-08 DIAGNOSIS — R7301 Impaired fasting glucose: Secondary | ICD-10-CM

## 2021-02-08 DIAGNOSIS — I10 Essential (primary) hypertension: Secondary | ICD-10-CM

## 2021-02-08 DIAGNOSIS — E559 Vitamin D deficiency, unspecified: Secondary | ICD-10-CM

## 2021-02-08 DIAGNOSIS — M810 Age-related osteoporosis without current pathological fracture: Secondary | ICD-10-CM

## 2021-02-08 LAB — COMPREHENSIVE METABOLIC PANEL
ALT: 10 U/L (ref 0–35)
AST: 18 U/L (ref 0–37)
Albumin: 4.2 g/dL (ref 3.5–5.2)
Alkaline Phosphatase: 91 U/L (ref 39–117)
BUN: 11 mg/dL (ref 6–23)
CO2: 30 mEq/L (ref 19–32)
Calcium: 9.6 mg/dL (ref 8.4–10.5)
Chloride: 104 mEq/L (ref 96–112)
Creatinine, Ser: 0.78 mg/dL (ref 0.40–1.20)
GFR: 74.18 mL/min (ref >60.00)
Glucose, Bld: 88 mg/dL (ref 70–99)
Potassium: 4.1 mEq/L (ref 3.5–5.1)
Sodium: 141 mEq/L (ref 135–145)
Total Bilirubin: 0.7 mg/dL (ref 0.2–1.2)
Total Protein: 6.5 g/dL (ref 6.0–8.3)

## 2021-02-08 LAB — LIPID PANEL
Cholesterol: 140 mg/dL (ref 0–200)
HDL: 61.3 mg/dL (ref >39.00)
LDL Cholesterol: 66 mg/dL (ref 0–99)
NonHDL: 78.22
Total CHOL/HDL Ratio: 2
Triglycerides: 62 mg/dL (ref 0.0–149.0)
VLDL: 12.4 mg/dL (ref 0.0–40.0)

## 2021-02-08 LAB — VITAMIN D 25 HYDROXY (VIT D DEFICIENCY, FRACTURES): VITD: 46.72 ng/mL (ref 30.00–100.00)

## 2021-02-08 LAB — HEMOGLOBIN A1C: Hgb A1c MFr Bld: 5.4 % (ref 4.6–6.5)

## 2021-02-08 NOTE — Patient Instructions (Addendum)
Please consider starting some form of treatment for osteoporosis  We can discuss options at your next visit

## 2021-02-08 NOTE — Progress Notes (Signed)
Patient ID: [name], female    DOB: September 24, 1945  Age: 75 y.o. MRN: [id number]  The patient is here for annual preventive  examination and management of other chronic and acute problems.   The risk factors are reflected in the social history.  The roster of all physicians providing medical care to patient - is listed in the Snapshot section of the chart.  Activities of daily living:  The patient is 100% independent in all ADLs: dressing, toileting, feeding as well as independent mobility  Home safety : The patient has smoke detectors in the home. They wear seatbelts.  There are no firearms at home. There is no violence in the home.   There is no risks for hepatitis, STDs or HIV. There is no   history of blood transfusion. They have no travel history to infectious disease endemic areas of the world.  The patient has seen their dentist in the last six month. They have seen their eye doctor in the last year. She denies hearing difficulty with regard to whispered voices and some television programs.  They have deferred audiologic testing in the last year.  They do not  have excessive sun exposure. Discussed the need for sun protection: hats, long sleeves and use of sunscreen if there is significant sun exposure.   Diet: the importance of a healthy diet is discussed. They do have a healthy diet.  The benefits of regular aerobic exercise were discussed. She walks 4 times per week ,  20 minutes.   Depression screen: there are no signs or vegative symptoms of depression- irritability, change in appetite, anhedonia, sadness/tearfullness.  Cognitive assessment: the patient manages all their financial and personal affairs and is actively engaged. They could relate day,date,year and events; recalled 2/3 objects at 3 minutes; performed clock-face test normally.  The following portions of the patient's history were reviewed and updated as appropriate: allergies, current medications, past family history,  past medical history,  past surgical history, past social history  and problem list.  Visual acuity was not assessed per patient preference since she has regular follow up with her ophthalmologist. Hearing and body mass index were assessed and reviewed.   During the course of the visit the patient was educated and counseled about appropriate screening and preventive services including : fall prevention , diabetes screening, nutrition counseling, colorectal cancer screening, and recommended immunizations.    CC: The primary encounter diagnosis was Impaired fasting glucose. Diagnoses of Vitamin D deficiency, Essential hypertension, COVID-19 vaccine administered, Encounter for preventive health examination, Hypothyroidism due to acquired atrophy of thyroid, Age-related osteoporosis without current pathological fracture, and Thoracic aortic atherosclerosis ([place]) were also pertinent to this visit.  1) Recent diagnostic mammogram/ultrasound of left breast negative for masses, suspicious calcifications.   Was done because of several "red spots" on left breast accompanied by  breast tenderness on physician exam.  Patient had been receiving bi annual  breast MRI due to 2 first degree relatives with breast CA . Also has maternal aunt with history of colon CA . HOwever she has been seen by oncology and told she does not need to continue using MRI for surveillance and to continue annual mammograms.   2) excessive burping due to aerophagia.  Discussed how to manage   3) osteoporosis : reviewed last DEXA .  Not  currently under treatment . History of esophagitis   4) Reviewed findings of prior CT scan today..  Patient is tolerating medium potency statin therapy    History  Crystal Kidd has a past medical history of Allergic rhinoconjunctivitis, Allergy (1992), Arthritis, Cataract, Diverticulosis, Eosinophilic esophagitis, GERD (gastroesophageal reflux disease), Hyperlipidemia, Hypertension, Osteopenia, Thyroid disease,  Transfusion history (1968), Trapezius muscle spasm (04/13/2015), and Vitiligo.   She has a past surgical history that includes Rotator cuff repair; Abdominal hysterectomy; Breast surgery; Appendectomy; Esophageal dilation; Colonoscopy; Eye surgery; Fracture surgery ([id number] [id number]); Tubal ligation; Upper gastrointestinal endoscopy; Cholecystectomy (N/A, 04/30/2016); Breast excisional biopsy (Right, 1972); Breast excisional biopsy (Left, 1980'S); and Esophagogastroduodenoscopy (egd) with propofol (N/A, 10/19/2019).   Her family history includes Breast cancer in her mother and sister; COPD in her brother; Cancer (age of onset: 42) in her maternal aunt; Colon cancer in her maternal aunt; Colon polyps in her brother and sister; Coronary artery disease in her brother; Emphysema in her father; Stomach cancer in her mother.She reports that she has never smoked. She has never used smokeless tobacco. She reports current alcohol use. She reports that she does not use drugs.  Outpatient Medications Prior to Visit  Medication Sig Dispense Refill   Alum Hydroxide-Mag Carbonate (GAVISCON PO) Take 1 capsule by mouth daily.     amLODipine (NORVASC) 5 MG tablet TAKE 1 TABLET EVERY DAY 90 tablet 3   gabapentin (NEURONTIN) 100 MG capsule TAKE 1 CAPSULE (100 MG TOTAL) BY MOUTH 3 (THREE) TIMES DAILY. EVERY 8 HOURS AS NEEDED (Patient taking differently: Take 100 mg by mouth 3 (three) times daily.) 90 capsule 1   Glycerin (OASIS TEARS PLUS PF OP) Apply 1 drop to eye.     hydrochlorothiazide (HYDRODIURIL) 25 MG tablet Take 1 tablet (25 mg total) by mouth daily. 90 tablet 1   Inulin (FIBER CHOICE PO) Take 1 tablet by mouth daily as needed.     levothyroxine (SYNTHROID) 75 MCG tablet TAKE 1 TABLET BY MOUTH DAILY BEFORE BREAKFAST. 90 tablet 0   loratadine (CLARITIN) 10 MG tablet Take 10 mg by mouth daily.     ondansetron (ZOFRAN ODT) 4 MG disintegrating tablet Take 1 tablet (4 mg total) by mouth every 8 (eight) hours as needed for  nausea or vomiting. 20 tablet 3   pantoprazole (PROTONIX) 40 MG tablet Take 1 tablet (40 mg total) by mouth daily. 90 tablet 1   Polyethyl Glycol-Propyl Glycol (SYSTANE OP) Place 1 drop into both eyes 4 (four) times daily as needed (dry eyes).     polyethylene glycol (MIRALAX / GLYCOLAX) 17 g packet Take 17 g by mouth daily.     simvastatin (ZOCOR) 20 MG tablet TAKE 1 TABLET (20 MG TOTAL) BY MOUTH EVERY EVENING. 90 tablet 3   No facility-administered medications prior to visit.    Review of Systems  Patient denies headache, fevers, malaise, unintentional weight loss, skin rash, eye pain, sinus congestion and sinus pain, sore throat, dysphagia,  hemoptysis , cough, dyspnea, wheezing, chest pain, palpitations, orthopnea, edema, abdominal pain, nausea, melena, diarrhea, constipation, flank pain, dysuria, hematuria, urinary  Frequency, nocturia, numbness, tingling, seizures,  Focal weakness, Loss of consciousness,  Tremor, insomnia, depression, anxiety, and suicidal ideation.     Objective:  BP 128/72 (BP Location: Left Arm, Patient Position: Sitting, Cuff Size: Normal)   Pulse 70   Temp (!) 96.7 F (35.9 C) (Temporal)   Resp 15   Ht '5\' 2"'  (1.575 m)   Wt 119 lb 9.6 oz (54.3 kg)   SpO2 96%   BMI 21.88 kg/m   Physical Exam  General appearance: alert, cooperative and appears stated age Ears: normal TM's and external ear canals both ears Throat:  lips, mucosa, and tongue normal; teeth and gums normal Neck: no adenopathy, no carotid bruit, supple, symmetrical, trachea midline and thyroid not enlarged, symmetric, no tenderness/mass/nodules Back: symmetric, no curvature. ROM normal. No CVA tenderness. Lungs: clear to auscultation bilaterally Heart: regular rate and rhythm, S1, S2 normal, no murmur, click, rub or gallop Abdomen: soft, non-tender; bowel sounds normal; no masses,  no organomegaly Pulses: 2+ and symmetric Skin: Skin color, texture, turgor normal. No rashes or lesions Lymph  nodes: Cervical, supraclavicular, and axillary nodes normal.   Assessment & Plan:   Problem List Items Addressed This Visit       Unprioritized   COVID-19 vaccine administered   Relevant Orders   SARS-CoV-2 Semi-Quantitative Total Antibody, Spike   Encounter for preventive health examination    age appropriate education and counseling updated, referrals for preventative services and immunizations addressed, dietary and smoking counseling addressed, most recent labs reviewed.  I have personally reviewed and have noted:   1) the patient's medical and social history 2) The pt's use of alcohol, tobacco, and illicit drugs 3) The patient's current medications and supplements 4) Functional ability including ADL's, fall risk, home safety risk, hearing and visual impairment 5) Diet and physical activities 6) Evidence for depression or mood disorder 7) The patient's height, weight, and BMI have been recorded in the chart   I have made referrals, and provided counseling and education based on review of the above       Essential hypertension   Relevant Orders   Lipid panel (Completed)   Comprehensive metabolic panel (Completed)   Hypothyroidism    Feeling better since thyroid function has normalized.  No changes today   Lab Results  Component Value Date   TSH 2.99 12/12/2020          Impaired fasting glucose - Primary    A1c is normal.   Lab Results  Component Value Date   HGBA1C 5.4 02/08/2021          Relevant Orders   Hemoglobin A1c (Completed)   Osteoporosis    Risks and benefits of treatment options discussed.  Discussed treatment options,  Calcium and Vit d requirements.  Evista  advised given her age , FH of BRCA, and contraindication to fosomax given her dysphagia. Will review at next visit as she had been feeling too poorly to consider starting another medication       Thoracic aortic atherosclerosis ([place])    Reviewed with patient today, She is taking statin  for primary prevention:  LDL and triglycerides are at goal .  She  has no side effects and liver enzymes are normal. No changes today. Lab Results  Component Value Date   CHOL 140 02/08/2021   HDL 61.30 02/08/2021   LDLCALC 66 02/08/2021   LDLDIRECT 79.0 11/30/2015   TRIG 62.0 02/08/2021   CHOLHDL 2 02/08/2021   Lab Results  Component Value Date   ALT 10 02/08/2021   AST 18 02/08/2021   ALKPHOS 91 02/08/2021   BILITOT 0.7 02/08/2021          Vitamin D deficiency   Relevant Orders   VITAMIN D 25 Hydroxy (Vit-D Deficiency, Fractures) (Completed)    I am having Zain A. Martel maintain her Polyethyl Glycol-Propyl Glycol (SYSTANE OP), gabapentin, polyethylene glycol, hydrochlorothiazide, loratadine, ondansetron, Alum Hydroxide-Mag Carbonate (GAVISCON PO), amLODipine, Glycerin (OASIS TEARS PLUS PF OP), Inulin (FIBER CHOICE PO), simvastatin, pantoprazole, and levothyroxine.  No orders of the defined types were placed in this encounter.  There are no discontinued medications.  Follow-up: Return in about 3 months (around 05/11/2021).   [name], MD

## 2021-02-10 DIAGNOSIS — R7301 Impaired fasting glucose: Secondary | ICD-10-CM | POA: Insufficient documentation

## 2021-02-10 DIAGNOSIS — Z23 Encounter for immunization: Secondary | ICD-10-CM | POA: Insufficient documentation

## 2021-02-10 NOTE — Assessment & Plan Note (Signed)
A1c is normal.   Lab Results  Component Value Date   HGBA1C 5.4 02/08/2021

## 2021-02-10 NOTE — Assessment & Plan Note (Signed)
Reviewed with patient today, She is taking statin for primary prevention:  LDL and triglycerides are at goal .  She  has no side effects and liver enzymes are normal. No changes today. Lab Results  Component Value Date   CHOL 140 02/08/2021   HDL 61.30 02/08/2021   LDLCALC 66 02/08/2021   LDLDIRECT 79.0 11/30/2015   TRIG 62.0 02/08/2021   CHOLHDL 2 02/08/2021   Lab Results  Component Value Date   ALT 10 02/08/2021   AST 18 02/08/2021   ALKPHOS 91 02/08/2021   BILITOT 0.7 02/08/2021

## 2021-02-10 NOTE — Assessment & Plan Note (Signed)
Feeling better since thyroid function has normalized.  No changes today   Lab Results  Component Value Date   TSH 2.99 12/12/2020

## 2021-02-10 NOTE — Assessment & Plan Note (Signed)

## 2021-02-10 NOTE — Assessment & Plan Note (Signed)
Risks and benefits of treatment options discussed.  Discussed treatment options,  Calcium and Vit d requirements.  [facility]ista  advised given her age , FH of BRCA, and contraindication to fosomax given her dysphagia. Will review at next visit as she had been feeling too poorly to consider starting another medication

## 2021-02-12 LAB — SARS-COV-2 SEMI-QUANTITATIVE TOTAL ANTIBODY, SPIKE: SARS COV2 AB, Total Spike Semi QN: 1883 U/mL — ABNORMAL HIGH (ref <0.8)

## 2021-03-07 ENCOUNTER — Telehealth: Payer: Self-pay | Admitting: Internal Medicine

## 2021-03-07 NOTE — Telephone Encounter (Signed)
Patient put ear drops in eye earlier today see if she is doing the right thing by washing out her eye and putting in refresh eye drops.she is not currently feeling better and her eye is red after doing this,please advise.

## 2021-03-08 DIAGNOSIS — S0501XA Injury of conjunctiva and corneal abrasion without foreign body, right eye, initial encounter: Secondary | ICD-10-CM | POA: Diagnosis not present

## 2021-03-08 NOTE — Telephone Encounter (Signed)
Called and spoke with Crystal Kidd. Crystal Kidd called into the office on 03/07/21 complaining about eye pain after a medication mix up. Pt put ear wax remover into her eye and states that she was in pain and her eye was read. Pt went to [facility] and spoke to a pharmacist and was instructed to avoid rinsing out her eye and to avoid getting any water in her eye due to it causing more irritation. Pt was told to continue using her Refresh Eye drops to try and get rid of the redness and that she will feel better after 2-3 days. Patient did use the Refresh eye drops and states that a lot of "Liquid" came out that was not tears. She applied a small drop of Refresh today, but has not applied any more because she wanted more medical advise. Pt stated that today her eye was just as red and she can feel swelling and believes her eye to be half closed due to the swelling. Pt states that she can see out of the eye fine, but it is still in pain. I informed her that Dr. Derrel Nip would want her to be evaluated for her eye swelling and pain and recommended [facility] as it is close to the ED and they should evaluate her eye and its swelling. Crystal Kidd agreed and states that she will go to [place] [facility] this morning for an evaluation.

## 2021-03-23 ENCOUNTER — Encounter: Payer: Self-pay | Admitting: Internal Medicine

## 2021-03-27 DIAGNOSIS — H16223 Keratoconjunctivitis sicca, not specified as Sjogren's, bilateral: Secondary | ICD-10-CM | POA: Diagnosis not present

## 2021-03-27 DIAGNOSIS — H04123 Dry eye syndrome of bilateral lacrimal glands: Secondary | ICD-10-CM | POA: Diagnosis not present

## 2021-03-27 DIAGNOSIS — H1045 Other chronic allergic conjunctivitis: Secondary | ICD-10-CM | POA: Diagnosis not present

## 2021-03-30 DIAGNOSIS — H04123 Dry eye syndrome of bilateral lacrimal glands: Secondary | ICD-10-CM | POA: Diagnosis not present

## 2021-03-30 DIAGNOSIS — H1045 Other chronic allergic conjunctivitis: Secondary | ICD-10-CM | POA: Diagnosis not present

## 2021-03-30 DIAGNOSIS — H16223 Keratoconjunctivitis sicca, not specified as Sjogren's, bilateral: Secondary | ICD-10-CM | POA: Diagnosis not present

## 2021-04-20 ENCOUNTER — Other Ambulatory Visit: Payer: Self-pay | Admitting: Internal Medicine

## 2021-04-23 ENCOUNTER — Telehealth: Payer: Self-pay | Admitting: Internal Medicine

## 2021-04-23 MED ORDER — LEVOTHYROXINE SODIUM 75 MCG PO TABS: ORAL_TABLET | ORAL | 0 refills | Status: DC

## 2021-04-23 MED ORDER — LEVOTHYROXINE SODIUM 75 MCG PO TABS: ORAL_TABLET | ORAL | 1 refills | Status: DC

## 2021-04-23 NOTE — Addendum Note (Signed)
Addended by: Elpidio Galea T on: 04/23/2021 10:54 AM   Modules accepted: Orders

## 2021-04-23 NOTE — Telephone Encounter (Signed)
Patient does not her levothyroxine (SYNTHROID) 75 MCG tablet filled by [facility] it is fill by [place] with a 72mrefill.

## 2021-04-26 ENCOUNTER — Telehealth: Payer: Self-pay

## 2021-04-26 NOTE — Telephone Encounter (Signed)
Patient was instructed on home care for sx. Call back to schedule an appointment with PCP if sx worsen by Access nurse.

## 2021-04-27 ENCOUNTER — Ambulatory Visit: Payer: Medicare HMO

## 2021-05-01 ENCOUNTER — Ambulatory Visit: Payer: Medicare HMO

## 2021-05-16 ENCOUNTER — Ambulatory Visit (INDEPENDENT_AMBULATORY_CARE_PROVIDER_SITE_OTHER): Payer: Medicare HMO | Admitting: Internal Medicine

## 2021-05-16 ENCOUNTER — Encounter: Payer: Self-pay | Admitting: Internal Medicine

## 2021-05-16 ENCOUNTER — Other Ambulatory Visit: Payer: Self-pay

## 2021-05-16 VITALS — BP 138/82 | HR 87 | Temp 95.9°F | Ht 62.0 in | Wt 126.4 lb

## 2021-05-16 DIAGNOSIS — I10 Essential (primary) hypertension: Secondary | ICD-10-CM

## 2021-05-16 DIAGNOSIS — F458 Other somatoform disorders: Secondary | ICD-10-CM | POA: Diagnosis not present

## 2021-05-16 DIAGNOSIS — E034 Atrophy of thyroid (acquired): Secondary | ICD-10-CM | POA: Diagnosis not present

## 2021-05-16 DIAGNOSIS — E782 Mixed hyperlipidemia: Secondary | ICD-10-CM

## 2021-05-16 DIAGNOSIS — Z23 Encounter for immunization: Secondary | ICD-10-CM | POA: Diagnosis not present

## 2021-05-16 DIAGNOSIS — R1319 Other dysphagia: Secondary | ICD-10-CM

## 2021-05-16 NOTE — Patient Instructions (Addendum)
Try using visine for the red eyes ,  and Continue refresh for dry eyes    Your bloating may be from the fiber you are taking (miralax):  Try using a Stool softener (Colace   or docusate ) ; it  may be better tolerated for management of constipation and can be taken every day instead of  miralax  .  200 mg daily is max dose.   Eat slower and be mindful of the air you are swallowing. This will also cause burping and bloating  Try adding Beano at each meal to prevent gas from vegetables that are hard to digest  Try switching mild to lactose free milk   Return in 3 months   fasting labs prior to visit

## 2021-05-16 NOTE — Progress Notes (Signed)
Subjective:  Patient ID: Crystal Kidd, female    DOB: 1945/10/04  Age: 75 y.o. MRN: [id number]  CC: The primary encounter diagnosis was Essential hypertension. Diagnoses of Mixed hyperlipidemia, Hypothyroidism due to acquired atrophy of thyroid, Need for immunization against influenza, Aerophagia, and Esophageal dysphagia were also pertinent to this visit.  HPI Crystal Kidd presents for  3 month follow up on chronic issues Chief Complaint  Patient presents with   Follow-up    3 month follow up    Thankfully she has gained 7 lbs since the end of June .  Tolerating a more liberal diet than she has in a year.  Weight gain acknowledged,  but she notes that her appetite has decreased for the past week.  Early satiety ; currently only eating only 1/4 meal daily  . Bloating, gas and burping.  Moves bowels most days    taking miralax    Having red eyes .  Mistakenly used an ear  wax remover  in her eye which caused burning , then was given an antibiotic by urgent care which caused much worse symptoms .  Saw eye doctor and steroid drops eased the pain . No vision changes  Had  viral sinusitis 3 weeks ago.  Covid negative .   Hypertension: patient checks blood pressure twice weekly at home.  Readings have been for the most part < 140/80 at rest . Patient is following a reduced salt diet most days and is taking medications as prescribed   Outpatient Medications Prior to Visit  Medication Sig Dispense Refill   Alum Hydroxide-Mag Carbonate (GAVISCON PO) Take 1 capsule by mouth daily.     amLODipine (NORVASC) 5 MG tablet TAKE 1 TABLET EVERY DAY 90 tablet 3   carboxymethylcellul-glycerin (REFRESH OPTIVE) 0.5-0.9 % ophthalmic solution Place 1 drop into both eyes as needed for dry eyes.     gabapentin (NEURONTIN) 100 MG capsule TAKE 1 CAPSULE (100 MG TOTAL) BY MOUTH 3 (THREE) TIMES DAILY. EVERY 8 HOURS AS NEEDED (Patient taking differently: Take 100 mg by mouth 3 (three) times daily.) 90 capsule 1    hydrochlorothiazide (HYDRODIURIL) 25 MG tablet Take 1 tablet (25 mg total) by mouth daily. 90 tablet 1   Inulin (FIBER CHOICE PO) Take 1 tablet by mouth daily as needed.     levothyroxine (SYNTHROID) 75 MCG tablet TAKE 1 TABLET BY MOUTH EVERY DAY BEFORE BREAKFAST 90 tablet 1   loratadine (CLARITIN) 10 MG tablet Take 10 mg by mouth daily.     ondansetron (ZOFRAN ODT) 4 MG disintegrating tablet Take 1 tablet (4 mg total) by mouth every 8 (eight) hours as needed for nausea or vomiting. 20 tablet 3   pantoprazole (PROTONIX) 40 MG tablet Take 1 tablet (40 mg total) by mouth daily. 90 tablet 1   polyethylene glycol (MIRALAX / GLYCOLAX) 17 g packet Take 17 g by mouth daily.     simvastatin (ZOCOR) 20 MG tablet TAKE 1 TABLET (20 MG TOTAL) BY MOUTH EVERY EVENING. 90 tablet 3   Glycerin (OASIS TEARS PLUS PF OP) Apply 1 drop to eye. (Patient not taking: Reported on 05/16/2021)     Polyethyl Glycol-Propyl Glycol (SYSTANE OP) Place 1 drop into both eyes 4 (four) times daily as needed (dry eyes). (Patient not taking: Reported on 05/16/2021)     No facility-administered medications prior to visit.    Review of Systems;  Patient denies headache, fevers, malaise, unintentional weight loss, skin rash, eye pain, sinus congestion and sinus  pain, sore throat, dysphagia,  hemoptysis , cough, dyspnea, wheezing, chest pain, palpitations, orthopnea, edema, abdominal pain, nausea, melena, diarrhea, constipation, flank pain, dysuria, hematuria, urinary  Frequency, nocturia, numbness, tingling, seizures,  Focal weakness, Loss of consciousness,  Tremor, insomnia, depression, anxiety, and suicidal ideation.      Objective:  BP 138/82 (BP Location: Left Arm, Patient Position: Sitting, Cuff Size: Normal)   Pulse 87   Temp (!) 95.9 F (35.5 C) (Temporal)   Ht 5\' 2"  (1.575 m)   Wt 126 lb 6.4 oz (57.3 kg)   SpO2 98%   BMI 23.12 kg/m   BP Readings from Last 3 Encounters:  05/16/21 138/82  02/08/21 128/72  01/23/21 (!)  166/72    Wt Readings from Last 3 Encounters:  05/16/21 126 lb 6.4 oz (57.3 kg)  02/08/21 119 lb 9.6 oz (54.3 kg)  01/23/21 121 lb 3.2 oz (55 kg)    General appearance: alert, cooperative and appears stated age Ears: normal TM's and external ear canals both ears Throat: lips, mucosa, and tongue normal; teeth and gums normal Neck: no adenopathy, no carotid bruit, supple, symmetrical, trachea midline and thyroid not enlarged, symmetric, no tenderness/mass/nodules Back: symmetric, no curvature. ROM normal. No CVA tenderness. Lungs: clear to auscultation bilaterally Heart: regular rate and rhythm, S1, S2 normal, no murmur, click, rub or gallop Abdomen: soft, non-tender; bowel sounds normal; no masses,  no organomegaly Pulses: 2+ and symmetric Skin: Skin color, texture, turgor normal. No rashes or lesions Lymph nodes: Cervical, supraclavicular, and axillary nodes normal.  Lab Results  Component Value Date   HGBA1C 5.4 02/08/2021   HGBA1C 5.5 06/20/2017   HGBA1C 5.6 08/25/2012    Lab Results  Component Value Date   CREATININE 0.78 02/08/2021   CREATININE 0.86 12/12/2020   CREATININE 0.75 08/01/2020    Lab Results  Component Value Date   WBC 4.5 12/12/2020   HGB 12.4 12/12/2020   HCT 37.1 12/12/2020   PLT 243.0 12/12/2020   GLUCOSE 88 02/08/2021   CHOL 140 02/08/2021   TRIG 62.0 02/08/2021   HDL 61.30 02/08/2021   LDLDIRECT 79.0 11/30/2015   LDLCALC 66 02/08/2021   ALT 10 02/08/2021   AST 18 02/08/2021   NA 141 02/08/2021   K 4.1 02/08/2021   CL 104 02/08/2021   CREATININE 0.78 02/08/2021   BUN 11 02/08/2021   CO2 30 02/08/2021   TSH 2.99 12/12/2020   HGBA1C 5.4 02/08/2021    US BREAST LTD UNI LEFT INC AXILLA  Result Date: 01/18/2021 CLINICAL DATA:  Focal LEFT breast pain EXAM: DIGITAL DIAGNOSTIC BILATERAL MAMMOGRAM WITH TOMOSYNTHESIS AND CAD; ULTRASOUND LEFT BREAST LIMITED TECHNIQUE: Bilateral digital diagnostic mammography and breast tomosynthesis was performed.  The images were evaluated with computer-aided detection.; Targeted ultrasound examination of the left breast was performed COMPARISON:  Previous exam(s). ACR Breast Density Category c: The breast tissue is heterogeneously dense, which may obscure small masses. FINDINGS: Spot compression tomosynthesis views were obtained over the area of focal pain in the LEFT breast. No suspicious mammographic finding is identified in this area. No suspicious mass, microcalcification, or other finding is identified in either breast. On physical exam, no suspicious mass is appreciated. Targeted LEFT breast ultrasound was performed in the area of pain at the inner breast. No suspicious solid or cystic mass is identified. Prominent costochondral cartilage is noted at the site of painful concern. IMPRESSION: 1. No mammographic or sonographic evidence of malignancy at the site of painful concern in the LEFT breast. Any further  workup of the patient's symptoms should be based on the clinical assessment. Recommend routine annual screening mammogram in 1 year. 2. No mammographic evidence of malignancy bilaterally. RECOMMENDATION: Screening mammogram in one year.(Code:SM-B-01Y) I have discussed the findings and recommendations with the patient. If applicable, a reminder letter will be sent to the patient regarding the next appointment. BI-RADS CATEGORY  1: Negative. Electronically Signed   By: Valentino Saxon MD   On: 01/18/2021 10:20  MM DIAG BREAST TOMO BILATERAL  Result Date: 01/18/2021 CLINICAL DATA:  Focal LEFT breast pain EXAM: DIGITAL DIAGNOSTIC BILATERAL MAMMOGRAM WITH TOMOSYNTHESIS AND CAD; ULTRASOUND LEFT BREAST LIMITED TECHNIQUE: Bilateral digital diagnostic mammography and breast tomosynthesis was performed. The images were evaluated with computer-aided detection.; Targeted ultrasound examination of the left breast was performed COMPARISON:  Previous exam(s). ACR Breast Density Category c: The breast tissue is  heterogeneously dense, which may obscure small masses. FINDINGS: Spot compression tomosynthesis views were obtained over the area of focal pain in the LEFT breast. No suspicious mammographic finding is identified in this area. No suspicious mass, microcalcification, or other finding is identified in either breast. On physical exam, no suspicious mass is appreciated. Targeted LEFT breast ultrasound was performed in the area of pain at the inner breast. No suspicious solid or cystic mass is identified. Prominent costochondral cartilage is noted at the site of painful concern. IMPRESSION: 1. No mammographic or sonographic evidence of malignancy at the site of painful concern in the LEFT breast. Any further workup of the patient's symptoms should be based on the clinical assessment. Recommend routine annual screening mammogram in 1 year. 2. No mammographic evidence of malignancy bilaterally. RECOMMENDATION: Screening mammogram in one year.(Code:SM-B-01Y) I have discussed the findings and recommendations with the patient. If applicable, a reminder letter will be sent to the patient regarding the next appointment. BI-RADS CATEGORY  1: Negative. Electronically Signed   By: Valentino Saxon MD   On: 01/18/2021 10:20   Assessment & Plan:   Problem List Items Addressed This Visit       Unprioritized   Aerophagia    Suggested by current report of bloating and burping        Esophageal dysphagia    Improved . Weight gain noted. Continue PPI      Essential hypertension - Primary    Well controlled on current regimen. Renal function stable, no changes today.      Relevant Orders   Comprehensive metabolic panel   Hypothyroidism   Relevant Orders   TSH   Other Visit Diagnoses     Mixed hyperlipidemia       Relevant Orders   Lipid panel   Need for immunization against influenza       Relevant Orders   Flu Vaccine QUAD High Dose(Fluad) (Completed)       I have discontinued Adrienna A. Boger's  Polyethyl Glycol-Propyl Glycol (SYSTANE OP) and Glycerin (OASIS TEARS PLUS PF OP). I am also having her maintain her gabapentin, polyethylene glycol, hydrochlorothiazide, loratadine, ondansetron, Alum Hydroxide-Mag Carbonate (GAVISCON PO), amLODipine, Inulin (FIBER CHOICE PO), simvastatin, pantoprazole, levothyroxine, and carboxymethylcellul-glycerin.  No orders of the defined types were placed in this encounter.   Medications Discontinued During This Encounter  Medication Reason   Glycerin (OASIS TEARS PLUS PF OP)    Polyethyl Glycol-Propyl Glycol (SYSTANE OP)     Follow-up: No follow-ups on file.   [name], MD

## 2021-05-17 ENCOUNTER — Other Ambulatory Visit: Payer: Self-pay | Admitting: Gastroenterology

## 2021-05-17 DIAGNOSIS — F458 Other somatoform disorders: Secondary | ICD-10-CM | POA: Insufficient documentation

## 2021-05-17 NOTE — Assessment & Plan Note (Signed)
Improved . Weight gain noted. Continue PPI

## 2021-05-17 NOTE — Assessment & Plan Note (Signed)
Suggested by current report of bloating and burping

## 2021-05-17 NOTE — Assessment & Plan Note (Signed)
Well controlled on current regimen. Renal function stable, no changes today. 

## 2021-06-13 DIAGNOSIS — L718 Other rosacea: Secondary | ICD-10-CM | POA: Diagnosis not present

## 2021-06-13 DIAGNOSIS — L821 Other seborrheic keratosis: Secondary | ICD-10-CM | POA: Diagnosis not present

## 2021-06-13 DIAGNOSIS — L814 Other melanin hyperpigmentation: Secondary | ICD-10-CM | POA: Diagnosis not present

## 2021-06-13 DIAGNOSIS — L570 Actinic keratosis: Secondary | ICD-10-CM | POA: Diagnosis not present

## 2021-06-13 DIAGNOSIS — D225 Melanocytic nevi of trunk: Secondary | ICD-10-CM | POA: Diagnosis not present

## 2021-06-13 DIAGNOSIS — D2272 Melanocytic nevi of left lower limb, including hip: Secondary | ICD-10-CM | POA: Diagnosis not present

## 2021-06-13 DIAGNOSIS — L80 Vitiligo: Secondary | ICD-10-CM | POA: Diagnosis not present

## 2021-06-13 DIAGNOSIS — D2271 Melanocytic nevi of right lower limb, including hip: Secondary | ICD-10-CM | POA: Diagnosis not present

## 2021-06-26 DIAGNOSIS — H10413 Chronic giant papillary conjunctivitis, bilateral: Secondary | ICD-10-CM | POA: Diagnosis not present

## 2021-06-26 DIAGNOSIS — H04123 Dry eye syndrome of bilateral lacrimal glands: Secondary | ICD-10-CM | POA: Diagnosis not present

## 2021-06-26 DIAGNOSIS — H02834 Dermatochalasis of left upper eyelid: Secondary | ICD-10-CM | POA: Diagnosis not present

## 2021-06-26 DIAGNOSIS — H16223 Keratoconjunctivitis sicca, not specified as Sjogren's, bilateral: Secondary | ICD-10-CM | POA: Diagnosis not present

## 2021-06-26 DIAGNOSIS — H353131 Nonexudative age-related macular degeneration, bilateral, early dry stage: Secondary | ICD-10-CM | POA: Diagnosis not present

## 2021-08-07 ENCOUNTER — Other Ambulatory Visit: Payer: Self-pay | Admitting: Internal Medicine

## 2021-08-16 ENCOUNTER — Other Ambulatory Visit: Payer: Self-pay

## 2021-08-16 ENCOUNTER — Other Ambulatory Visit (INDEPENDENT_AMBULATORY_CARE_PROVIDER_SITE_OTHER): Payer: Medicare HMO

## 2021-08-16 DIAGNOSIS — E034 Atrophy of thyroid (acquired): Secondary | ICD-10-CM

## 2021-08-16 DIAGNOSIS — I10 Essential (primary) hypertension: Secondary | ICD-10-CM | POA: Diagnosis not present

## 2021-08-16 DIAGNOSIS — E782 Mixed hyperlipidemia: Secondary | ICD-10-CM | POA: Diagnosis not present

## 2021-08-16 LAB — COMPREHENSIVE METABOLIC PANEL
ALT: 13 U/L (ref 0–35)
AST: 20 U/L (ref 0–37)
Albumin: 3.8 g/dL (ref 3.5–5.2)
Alkaline Phosphatase: 85 U/L (ref 39–117)
BUN: 12 mg/dL (ref 6–23)
CO2: 29 mEq/L (ref 19–32)
Calcium: 9.4 mg/dL (ref 8.4–10.5)
Chloride: 106 mEq/L (ref 96–112)
Creatinine, Ser: 0.84 mg/dL (ref 0.40–1.20)
GFR: 67.62 mL/min (ref >60.00)
Glucose, Bld: 94 mg/dL (ref 70–99)
Potassium: 4.3 mEq/L (ref 3.5–5.1)
Sodium: 140 mEq/L (ref 135–145)
Total Bilirubin: 0.6 mg/dL (ref 0.2–1.2)
Total Protein: 6.4 g/dL (ref 6.0–8.3)

## 2021-08-16 LAB — LIPID PANEL
Cholesterol: 133 mg/dL (ref 0–200)
HDL: 50.9 mg/dL (ref >39.00)
LDL Cholesterol: 67 mg/dL (ref 0–99)
NonHDL: 82.35
Total CHOL/HDL Ratio: 3
Triglycerides: 75 mg/dL (ref 0.0–149.0)
VLDL: 15 mg/dL (ref 0.0–40.0)

## 2021-08-16 LAB — TSH: TSH: 2.36 u[IU]/mL (ref 0.35–5.50)

## 2021-08-20 ENCOUNTER — Ambulatory Visit (INDEPENDENT_AMBULATORY_CARE_PROVIDER_SITE_OTHER): Payer: Medicare HMO | Admitting: Internal Medicine

## 2021-08-20 ENCOUNTER — Encounter: Payer: Self-pay | Admitting: Internal Medicine

## 2021-08-20 ENCOUNTER — Other Ambulatory Visit: Payer: Self-pay

## 2021-08-20 DIAGNOSIS — K582 Mixed irritable bowel syndrome: Secondary | ICD-10-CM | POA: Diagnosis not present

## 2021-08-20 DIAGNOSIS — I10 Essential (primary) hypertension: Secondary | ICD-10-CM

## 2021-08-20 DIAGNOSIS — R1319 Other dysphagia: Secondary | ICD-10-CM | POA: Diagnosis not present

## 2021-08-20 DIAGNOSIS — K589 Irritable bowel syndrome without diarrhea: Secondary | ICD-10-CM | POA: Insufficient documentation

## 2021-08-20 NOTE — Assessment & Plan Note (Addendum)
Symptoms have returned after several months resolution, characterized by relief of symptoms with burping excessively.  There was no stricture by March 2021 EGD . Aerophagia was noted on barium swallow .  Advised to slow eating, resume PPI

## 2021-08-20 NOTE — Patient Instructions (Addendum)
I think you have "IBS (Irritable Bowel Syndrome) .  I recommend that you Start taking a natural source of probiotic once daily  in theform of Yougurt ([place]) .   Gradually increase your water or non caffeinated  beverages to the point where you are getting 60 ounces of water daily to help your bowels.    Try taking miralax with a full glass of water ( at least 8 ounces of water )  Try using "mylanta GAS "  instead of Gaviscon  for stomach and gas issues.   Try taking peppermint oil  ( available commercially as "IBGARD) for irritable bowel symptoms

## 2021-08-20 NOTE — Assessment & Plan Note (Signed)
Well controlled on current regimen based oh home readings on a validated machine. . Renal function stable, no changes today. 

## 2021-08-20 NOTE — Progress Notes (Signed)
Subjective:  Patient ID: Crystal Kidd, female    DOB: Jul 04, 1946  Age: 76 y.o. MRN: [id number]  CC: Diagnoses of Elevated blood pressure reading in office with white coat syndrome, with diagnosis of hypertension, Esophageal dysphagia, and Irritable bowel syndrome with both constipation and diarrhea were pertinent to this visit.   This visit occurred during the SARS-CoV-2 public health emergency.  Safety protocols were in place, including screening questions prior to the visit, additional usage of staff PPE, and extensive cleaning of exam room while observing appropriate contact time as indicated for disinfecting solutions.    HPI Crystal Kidd presents for  Chief Complaint  Patient presents with   Follow-up    Follow up on hypertension, hyperlipidemia, hypothyroidism    Saw dermatology recently,  vitiligo , atypical moles , rosacea  Saw ophthalmology for dry eye  Cc:  has had a return of nausea since December.  Gets full quickly followed by abdominal pain  if she eats too much.  Weight is stable but still down 20 pts compared to last year. . Feels like her food is backing up on her at times , after eating  has to burp to get  pills to go down. Does not occur daily .  Does not take a probiotic .  Taking a fiber laxative and colace but had an episode of constipation lasting 5 days,  resolved with prunes. Waking up with nausea occasionally after going to bed .  Supplementing diet with Boost.   HTN:  Patient is taking her medications as prescribed and notes no adverse effects.  Home BP readings have been done about once per week and are  generally < 130/80 .  She is avoiding added salt in her diet and walking regularly about 3 times per week for exercise  .     Outpatient Medications Prior to Visit  Medication Sig Dispense Refill   Alum Hydroxide-Mag Carbonate (GAVISCON PO) Take 1 capsule by mouth daily.     amLODipine (NORVASC) 5 MG tablet TAKE 1 TABLET EVERY DAY 90 tablet 3    carboxymethylcellul-glycerin (REFRESH OPTIVE) 0.5-0.9 % ophthalmic solution Place 1 drop into both eyes as needed for dry eyes.     docusate sodium (COLACE) 100 MG capsule Take 100 mg by mouth daily.     gabapentin (NEURONTIN) 100 MG capsule TAKE 1 CAPSULE (100 MG TOTAL) BY MOUTH 3 (THREE) TIMES DAILY. EVERY 8 HOURS AS NEEDED (Patient taking differently: Take 100 mg by mouth 3 (three) times daily.) 90 capsule 1   hydrochlorothiazide (HYDRODIURIL) 25 MG tablet Take 1 tablet (25 mg total) by mouth daily. 90 tablet 1   Inulin (FIBER CHOICE PO) Take 1 tablet by mouth daily as needed.     levothyroxine (SYNTHROID) 75 MCG tablet TAKE 1 TABLET BY MOUTH EVERY DAY BEFORE BREAKFAST 90 tablet 1   ondansetron (ZOFRAN-ODT) 4 MG disintegrating tablet TAKE 1 TABLET BY MOUTH EVERY 8 HOURS AS NEEDED FOR NAUSEA AND VOMITING 20 tablet 3   pantoprazole (PROTONIX) 40 MG tablet TAKE 1 TABLET EVERY DAY 90 tablet 1   polyethylene glycol (MIRALAX / GLYCOLAX) 17 g packet Take 17 g by mouth daily.     simvastatin (ZOCOR) 20 MG tablet TAKE 1 TABLET (20 MG TOTAL) BY MOUTH EVERY EVENING. 90 tablet 3   loratadine (CLARITIN) 10 MG tablet Take 10 mg by mouth daily. (Patient not taking: Reported on 08/20/2021)     No facility-administered medications prior to visit.    Review of  Systems;  Patient denies headache, fevers, malaise, unintentional weight loss, skin rash, eye pain, sinus congestion and sinus pain, sore throat, dysphagia,  hemoptysis , cough, dyspnea, wheezing, chest pain, palpitations, orthopnea, edema, abdominal pain, nausea, melena, diarrhea, constipation, flank pain, dysuria, hematuria, urinary  Frequency, nocturia, numbness, tingling, seizures,  Focal weakness, Loss of consciousness,  Tremor, insomnia, depression, anxiety, and suicidal ideation.      Objective:  BP (!) 158/80 (BP Location: Left Arm, Patient Position: Sitting, Cuff Size: Normal)    Pulse 85    Temp 98.2 F (36.8 C) (Oral)    Ht 5\' 2"  (1.575 m)     Wt 122 lb (55.3 kg)    SpO2 98%    BMI 22.31 kg/m   BP Readings from Last 3 Encounters:  08/20/21 (!) 158/80  05/16/21 138/82  02/08/21 128/72    Wt Readings from Last 3 Encounters:  08/20/21 122 lb (55.3 kg)  05/16/21 126 lb 6.4 oz (57.3 kg)  02/08/21 119 lb 9.6 oz (54.3 kg)    General appearance: alert, cooperative and appears stated age Ears: normal TM's and external ear canals both ears Throat: lips, mucosa, and tongue normal; teeth and gums normal Neck: no adenopathy, no carotid bruit, supple, symmetrical, trachea midline and thyroid not enlarged, symmetric, no tenderness/mass/nodules Back: symmetric, no curvature. ROM normal. No CVA tenderness. Lungs: clear to auscultation bilaterally Heart: regular rate and rhythm, S1, S2 normal, no murmur, click, rub or gallop Abdomen: soft, non-tender; bowel sounds normal; no masses,  no organomegaly Pulses: 2+ and symmetric Skin: Skin color, texture, turgor normal. No rashes or lesions Lymph nodes: Cervical, supraclavicular, and axillary nodes normal.  Lab Results  Component Value Date   HGBA1C 5.4 02/08/2021   HGBA1C 5.5 06/20/2017   HGBA1C 5.6 08/25/2012    Lab Results  Component Value Date   CREATININE 0.84 08/16/2021   CREATININE 0.78 02/08/2021   CREATININE 0.86 12/12/2020    Lab Results  Component Value Date   WBC 4.5 12/12/2020   HGB 12.4 12/12/2020   HCT 37.1 12/12/2020   PLT 243.0 12/12/2020   GLUCOSE 94 08/16/2021   CHOL 133 08/16/2021   TRIG 75.0 08/16/2021   HDL 50.90 08/16/2021   LDLDIRECT 79.0 11/30/2015   LDLCALC 67 08/16/2021   ALT 13 08/16/2021   AST 20 08/16/2021   NA 140 08/16/2021   K 4.3 08/16/2021   CL 106 08/16/2021   CREATININE 0.84 08/16/2021   BUN 12 08/16/2021   CO2 29 08/16/2021   TSH 2.36 08/16/2021   HGBA1C 5.4 02/08/2021    US BREAST LTD UNI LEFT INC AXILLA  Result Date: 01/18/2021 CLINICAL DATA:  Focal LEFT breast pain EXAM: DIGITAL DIAGNOSTIC BILATERAL MAMMOGRAM WITH  TOMOSYNTHESIS AND CAD; ULTRASOUND LEFT BREAST LIMITED TECHNIQUE: Bilateral digital diagnostic mammography and breast tomosynthesis was performed. The images were evaluated with computer-aided detection.; Targeted ultrasound examination of the left breast was performed COMPARISON:  Previous exam(s). ACR Breast Density Category c: The breast tissue is heterogeneously dense, which may obscure small masses. FINDINGS: Spot compression tomosynthesis views were obtained over the area of focal pain in the LEFT breast. No suspicious mammographic finding is identified in this area. No suspicious mass, microcalcification, or other finding is identified in either breast. On physical exam, no suspicious mass is appreciated. Targeted LEFT breast ultrasound was performed in the area of pain at the inner breast. No suspicious solid or cystic mass is identified. Prominent costochondral cartilage is noted at the site of painful concern. IMPRESSION:  1. No mammographic or sonographic evidence of malignancy at the site of painful concern in the LEFT breast. Any further workup of the patient's symptoms should be based on the clinical assessment. Recommend routine annual screening mammogram in 1 year. 2. No mammographic evidence of malignancy bilaterally. RECOMMENDATION: Screening mammogram in one year.(Code:SM-B-01Y) I have discussed the findings and recommendations with the patient. If applicable, a reminder letter will be sent to the patient regarding the next appointment. BI-RADS CATEGORY  1: Negative. Electronically Signed   By: Valentino Saxon MD   On: 01/18/2021 10:20  MM DIAG BREAST TOMO BILATERAL  Result Date: 01/18/2021 CLINICAL DATA:  Focal LEFT breast pain EXAM: DIGITAL DIAGNOSTIC BILATERAL MAMMOGRAM WITH TOMOSYNTHESIS AND CAD; ULTRASOUND LEFT BREAST LIMITED TECHNIQUE: Bilateral digital diagnostic mammography and breast tomosynthesis was performed. The images were evaluated with computer-aided detection.; Targeted  ultrasound examination of the left breast was performed COMPARISON:  Previous exam(s). ACR Breast Density Category c: The breast tissue is heterogeneously dense, which may obscure small masses. FINDINGS: Spot compression tomosynthesis views were obtained over the area of focal pain in the LEFT breast. No suspicious mammographic finding is identified in this area. No suspicious mass, microcalcification, or other finding is identified in either breast. On physical exam, no suspicious mass is appreciated. Targeted LEFT breast ultrasound was performed in the area of pain at the inner breast. No suspicious solid or cystic mass is identified. Prominent costochondral cartilage is noted at the site of painful concern. IMPRESSION: 1. No mammographic or sonographic evidence of malignancy at the site of painful concern in the LEFT breast. Any further workup of the patient's symptoms should be based on the clinical assessment. Recommend routine annual screening mammogram in 1 year. 2. No mammographic evidence of malignancy bilaterally. RECOMMENDATION: Screening mammogram in one year.(Code:SM-B-01Y) I have discussed the findings and recommendations with the patient. If applicable, a reminder letter will be sent to the patient regarding the next appointment. BI-RADS CATEGORY  1: Negative. Electronically Signed   By: Valentino Saxon MD   On: 01/18/2021 10:20   Assessment & Plan:   Problem List Items Addressed This Visit     Elevated blood pressure reading in office with white coat syndrome, with diagnosis of hypertension    Well controlled on current regimen based oh home readings on a validated machine. . Renal function stable, no changes today.      Esophageal dysphagia    Symptoms have returned after several months resolution, characterized by relief of symptoms with burping excessively.  There was no stricture by March 2021 EGD . Aerophagia was noted on barium swallow .  Advised to slow eating, resume PPI        IBS (irritable bowel syndrome)    Symptoms of bloating, early satiety,  Have returned without weight loss. Recommend trial of IBGARD and low fodmap diet       Relevant Medications   docusate sodium (COLACE) 100 MG capsule    I have discontinued Letta Median A. Toppins's loratadine. I am also having her maintain her gabapentin, polyethylene glycol, hydrochlorothiazide, Alum Hydroxide-Mag Carbonate (GAVISCON PO), amLODipine, Inulin (FIBER CHOICE PO), simvastatin, levothyroxine, carboxymethylcellul-glycerin, pantoprazole, ondansetron, and docusate sodium.  No orders of the defined types were placed in this encounter.    I provided  30 minutes of  face-to-face time during this encounter reviewing patient's current problems and past surgeries, labs and imaging studies, providing counseling on the above mentioned problems , and coordination  of care .   Follow-up: Return in  about 8 weeks (around 10/15/2021).   [name], MD

## 2021-08-20 NOTE — Assessment & Plan Note (Signed)
Symptoms of bloating, early satiety,  Have returned without weight loss. Recommend trial of IBGARD and low fodmap diet

## 2021-09-05 ENCOUNTER — Other Ambulatory Visit: Payer: Self-pay | Admitting: Internal Medicine

## 2021-09-12 ENCOUNTER — Encounter: Payer: Self-pay | Admitting: Internal Medicine

## 2021-09-13 ENCOUNTER — Telehealth: Payer: Self-pay

## 2021-09-13 MED ORDER — ESOMEPRAZOLE MAGNESIUM 40 MG PO CPDR: 40.0000 mg | DELAYED_RELEASE_CAPSULE | Freq: Every day | ORAL | 2 refills | Status: DC

## 2021-09-13 NOTE — Telephone Encounter (Signed)
Patient states her symptoms are a lot worse. She verbalized understanding and would like to switch to Nexium

## 2021-09-13 NOTE — Telephone Encounter (Signed)
Patient is calling because she has been having worse acid reflex. She states she is full all the time, dysphagia and burning in her chest and throat. She is taking pantoprazole but does not feel like it is helping. Wants to know what else you recommend for the acid reflex

## 2021-10-12 ENCOUNTER — Other Ambulatory Visit: Payer: Self-pay | Admitting: Gastroenterology

## 2021-10-15 ENCOUNTER — Encounter: Payer: Self-pay | Admitting: Internal Medicine

## 2021-10-15 ENCOUNTER — Other Ambulatory Visit: Payer: Self-pay

## 2021-10-15 ENCOUNTER — Ambulatory Visit (INDEPENDENT_AMBULATORY_CARE_PROVIDER_SITE_OTHER): Payer: Medicare HMO | Admitting: Internal Medicine

## 2021-10-15 VITALS — BP 160/80 | HR 76 | Temp 98.0°F | Ht 62.0 in | Wt 121.4 lb

## 2021-10-15 DIAGNOSIS — K589 Irritable bowel syndrome without diarrhea: Secondary | ICD-10-CM

## 2021-10-15 DIAGNOSIS — R1319 Other dysphagia: Secondary | ICD-10-CM | POA: Diagnosis not present

## 2021-10-15 DIAGNOSIS — F458 Other somatoform disorders: Secondary | ICD-10-CM

## 2021-10-15 DIAGNOSIS — K582 Mixed irritable bowel syndrome: Secondary | ICD-10-CM | POA: Diagnosis not present

## 2021-10-15 DIAGNOSIS — I10 Essential (primary) hypertension: Secondary | ICD-10-CM | POA: Diagnosis not present

## 2021-10-15 DIAGNOSIS — R0989 Other specified symptoms and signs involving the circulatory and respiratory systems: Secondary | ICD-10-CM

## 2021-10-15 MED ORDER — FAMOTIDINE 20 MG PO TABS: 20.0000 mg | ORAL_TABLET | Freq: Every day | ORAL | 1 refills | Status: DC

## 2021-10-15 NOTE — Addendum Note (Signed)
Addended by: [name] on: 10/15/2021 02:51 PM ? ? Modules accepted: Level of Service ? ?

## 2021-10-15 NOTE — Progress Notes (Signed)
Subjective:  Patient ID: Crystal Kidd, female    DOB: [birth year]  Age: 76 y.o. MRN: [id number]  CC: The primary encounter diagnosis was Esophageal dysphagia. Diagnoses of Aerophagia, Irritable bowel syndrome with both constipation and diarrhea, Globus sensation, and Elevated blood pressure reading in office with white coat syndrome, with diagnosis of hypertension were also pertinent to this visit.   This visit occurred during the SARS-CoV-2 public health emergency.  Safety protocols were in place, including screening questions prior to the visit, additional usage of staff PPE, and extensive cleaning of exam room while observing appropriate contact time as indicated for disinfecting solutions.    HPI Crystal Kidd presents for  Chief Complaint  Patient presents with   Follow-up    Follow up on hypertension   1) Patient is taking amlodipine  as prescribed and notes no adverse effects.  Home BP readings have been done about once per day  and are  generally < 646 to 803 systolic/  60 to 70  but become elevated in the office .  She is avoiding added salt in her diet and going to the gym and walking regularly about 4 times per week for exercise  .   2) GERD :  switched to nexium in February by Dr Allen Norris , GI after reporting increased symptoms of GERD ,  early satiety  which results in abd pain  if she eats.  Had a unintentional  weight loss of 7 lbs in one week.  Somewhat better with nexium, BUT STILL HAVING abd pain after dinner.  Notes pills are getting stuck ,  history of esophageal stricture with prior  dilation during hospitalization in Dec 2019   .  Still burping a lot,  gets anxious when she can't force a burp   3) IBS:  Started IB Gard  for IBS,  feels it is helping with her regularity , doesn't have to take miralax anymore.  BM's more normal in passage.    Outpatient Medications Prior to Visit  Medication Sig Dispense Refill   Alum Hydroxide-Mag Carbonate (GAVISCON PO) Take 1 capsule by  mouth daily.     amLODipine (NORVASC) 5 MG tablet TAKE 1 TABLET EVERY DAY 90 tablet 3   carboxymethylcellul-glycerin (REFRESH OPTIVE) 0.5-0.9 % ophthalmic solution Place 1 drop into both eyes as needed for dry eyes.     esomeprazole (NEXIUM) 40 MG capsule Take 1 capsule (40 mg total) by mouth daily before breakfast. 30 capsule 2   gabapentin (NEURONTIN) 100 MG capsule TAKE 1 CAPSULE (100 MG TOTAL) BY MOUTH 3 (THREE) TIMES DAILY. EVERY 8 HOURS AS NEEDED (Patient taking differently: Take 100 mg by mouth 3 (three) times daily.) 90 capsule 1   hydrochlorothiazide (HYDRODIURIL) 25 MG tablet Take 1 tablet (25 mg total) by mouth daily. 90 tablet 1   Inulin (FIBER CHOICE PO) Take 1 tablet by mouth daily as needed.     levothyroxine (SYNTHROID) 75 MCG tablet TAKE 1 TABLET BY MOUTH EVERY DAY BEFORE BREAKFAST 90 tablet 1   ondansetron (ZOFRAN-ODT) 4 MG disintegrating tablet TAKE 1 TABLET BY MOUTH EVERY 8 HOURS AS NEEDED FOR NAUSEA AND VOMITING 20 tablet 3   Peppermint Oil (IBGARD PO) Take by mouth.     polyethylene glycol (MIRALAX / GLYCOLAX) 17 g packet Take 17 g by mouth daily.     simvastatin (ZOCOR) 20 MG tablet TAKE 1 TABLET (20 MG TOTAL) BY MOUTH EVERY EVENING. 90 tablet 3   docusate sodium (COLACE) 100 MG  capsule Take 100 mg by mouth daily. (Patient not taking: Reported on 10/15/2021)     pantoprazole (PROTONIX) 40 MG tablet TAKE 1 TABLET EVERY DAY 90 tablet 1   No facility-administered medications prior to visit.    Review of Systems;  Patient denies headache, fevers, malaise, unintentional weight loss, skin rash, eye pain, sinus congestion and sinus pain, sore throat, dysphagia,  hemoptysis , cough, dyspnea, wheezing, chest pain, palpitations, orthopnea, edema, abdominal pain, nausea, melena, diarrhea, constipation, flank pain, dysuria, hematuria, urinary  Frequency, nocturia, numbness, tingling, seizures,  Focal weakness, Loss of consciousness,  Tremor, insomnia, depression, anxiety, and suicidal  ideation.      Objective:  BP (!) 160/80 (BP Location: Left Arm, Patient Position: Sitting, Cuff Size: Normal)    Pulse 76    Temp 98 F (36.7 C) (Oral)    Ht '5\' 2"'$  (1.575 m)    Wt 121 lb 6.4 oz (55.1 kg)    SpO2 95%    BMI 22.20 kg/m   BP Readings from Last 3 Encounters:  10/15/21 (!) 160/80  08/20/21 (!) 158/80  05/16/21 138/82    Wt Readings from Last 3 Encounters:  10/15/21 121 lb 6.4 oz (55.1 kg)  08/20/21 122 lb (55.3 kg)  05/16/21 126 lb 6.4 oz (57.3 kg)    General appearance: alert, cooperative and appears stated age Ears: normal TM's and external ear canals both ears Throat: lips, mucosa, and tongue normal; teeth and gums normal Neck: no adenopathy, no carotid bruit, supple, symmetrical, trachea midline and thyroid not enlarged, symmetric, no tenderness/mass/nodules Back: symmetric, no curvature. ROM normal. No CVA tenderness. Lungs: clear to auscultation bilaterally Heart: regular rate and rhythm, S1, S2 normal, no murmur, click, rub or gallop Abdomen: soft, non-tender; bowel sounds normal; no masses,  no organomegaly Pulses: 2+ and symmetric Skin: Skin color, texture, turgor normal. No rashes or lesions Lymph nodes: Cervical, supraclavicular, and axillary nodes normal.  Lab Results  Component Value Date   HGBA1C 5.4 02/08/2021   HGBA1C 5.5 06/20/2017   HGBA1C 5.6 08/25/2012    Lab Results  Component Value Date   CREATININE 0.84 08/16/2021   CREATININE 0.78 02/08/2021   CREATININE 0.86 12/12/2020    Lab Results  Component Value Date   WBC 4.5 12/12/2020   HGB 12.4 12/12/2020   HCT 37.1 12/12/2020   PLT 243.0 12/12/2020   GLUCOSE 94 08/16/2021   CHOL 133 08/16/2021   TRIG 75.0 08/16/2021   HDL 50.90 08/16/2021   LDLDIRECT 79.0 11/30/2015   LDLCALC 67 08/16/2021   ALT 13 08/16/2021   AST 20 08/16/2021   NA 140 08/16/2021   K 4.3 08/16/2021   CL 106 08/16/2021   CREATININE 0.84 08/16/2021   BUN 12 08/16/2021   CO2 29 08/16/2021   TSH 2.36  08/16/2021   HGBA1C 5.4 02/08/2021    US BREAST LTD UNI LEFT INC AXILLA  Result Date: 01/18/2021 CLINICAL DATA:  Focal LEFT breast pain EXAM: DIGITAL DIAGNOSTIC BILATERAL MAMMOGRAM WITH TOMOSYNTHESIS AND CAD; ULTRASOUND LEFT BREAST LIMITED TECHNIQUE: Bilateral digital diagnostic mammography and breast tomosynthesis was performed. The images were evaluated with computer-aided detection.; Targeted ultrasound examination of the left breast was performed COMPARISON:  Previous exam(s). ACR Breast Density Category c: The breast tissue is heterogeneously dense, which may obscure small masses. FINDINGS: Spot compression tomosynthesis views were obtained over the area of focal pain in the LEFT breast. No suspicious mammographic finding is identified in this area. No suspicious mass, microcalcification, or other finding is identified  in either breast. On physical exam, no suspicious mass is appreciated. Targeted LEFT breast ultrasound was performed in the area of pain at the inner breast. No suspicious solid or cystic mass is identified. Prominent costochondral cartilage is noted at the site of painful concern. IMPRESSION: 1. No mammographic or sonographic evidence of malignancy at the site of painful concern in the LEFT breast. Any further workup of the patient's symptoms should be based on the clinical assessment. Recommend routine annual screening mammogram in 1 year. 2. No mammographic evidence of malignancy bilaterally. RECOMMENDATION: Screening mammogram in one year.(Code:SM-B-01Y) I have discussed the findings and recommendations with the patient. If applicable, a reminder letter will be sent to the patient regarding the next appointment. BI-RADS CATEGORY  1: Negative. Electronically Signed   By: Valentino Saxon MD   On: 01/18/2021 10:20  MM DIAG BREAST TOMO BILATERAL  Result Date: 01/18/2021 CLINICAL DATA:  Focal LEFT breast pain EXAM: DIGITAL DIAGNOSTIC BILATERAL MAMMOGRAM WITH TOMOSYNTHESIS AND CAD;  ULTRASOUND LEFT BREAST LIMITED TECHNIQUE: Bilateral digital diagnostic mammography and breast tomosynthesis was performed. The images were evaluated with computer-aided detection.; Targeted ultrasound examination of the left breast was performed COMPARISON:  Previous exam(s). ACR Breast Density Category c: The breast tissue is heterogeneously dense, which may obscure small masses. FINDINGS: Spot compression tomosynthesis views were obtained over the area of focal pain in the LEFT breast. No suspicious mammographic finding is identified in this area. No suspicious mass, microcalcification, or other finding is identified in either breast. On physical exam, no suspicious mass is appreciated. Targeted LEFT breast ultrasound was performed in the area of pain at the inner breast. No suspicious solid or cystic mass is identified. Prominent costochondral cartilage is noted at the site of painful concern. IMPRESSION: 1. No mammographic or sonographic evidence of malignancy at the site of painful concern in the LEFT breast. Any further workup of the patient's symptoms should be based on the clinical assessment. Recommend routine annual screening mammogram in 1 year. 2. No mammographic evidence of malignancy bilaterally. RECOMMENDATION: Screening mammogram in one year.(Code:SM-B-01Y) I have discussed the findings and recommendations with the patient. If applicable, a reminder letter will be sent to the patient regarding the next appointment. BI-RADS CATEGORY  1: Negative. Electronically Signed   By: Valentino Saxon MD   On: 01/18/2021 10:20   Assessment & Plan:   Problem List Items Addressed This Visit     Aerophagia    behavior modification and simethicone trial       Elevated blood pressure reading in office with white coat syndrome, with diagnosis of hypertension    Well controlled on current regimen based oh home readings on a validated machine. . Renal function stable, no changes today.      Esophageal  dysphagia - Primary   Relevant Orders   DG ESOPHAGUS W SINGLE CM (SOL OR THIN BA)   Globus sensation    Now reporting recurrent  dysphagia for pills only.  DG Esophagus ordered      IBS (irritable bowel syndrome)    Improved passage of stools with IBGARD      Relevant Medications   famotidine (PEPCID) 20 MG tablet    I spent 30 minutes dedicated to the care of this patient on the date of this encounter to include pre-visit review of patient's medical history,  most recent imaging studies, Face-to-face time with the patient , and post visit ordering of testing and therapeutics.    Follow-up: No follow-ups on file.  [name], MD

## 2021-10-15 NOTE — Assessment & Plan Note (Signed)
Well controlled on current regimen based oh home readings on a validated machine. . Renal function stable, no changes today. 

## 2021-10-15 NOTE — Assessment & Plan Note (Signed)
Improved passage of stools with IBGARD ?

## 2021-10-15 NOTE — Patient Instructions (Addendum)
For the stomach issues: ? ?Try taking famotidine (rx) and simethicone 30 minutes prior to dinner meal  to see if it helps the nighttime pain  ? ?You can shorten the interval between nexium and breakfast to 30 minutes ? ? ?For the swallowing issues: ? ?I have ordered a swallow study to look for a stricture ? ? ? ?You have "white coat hypertension."  Your home readings are what we will use to determine if you are under control . As long as your readings at home are  120/70 to 130/80,  no changes are needed .   ? ? ? ?

## 2021-10-15 NOTE — Assessment & Plan Note (Signed)
behavior modification and simethicone trial  ?

## 2021-10-15 NOTE — Assessment & Plan Note (Signed)
Now reporting recurrent  dysphagia for pills only.  DG Esophagus ordered ?

## 2021-10-16 ENCOUNTER — Telehealth: Payer: Self-pay | Admitting: Gastroenterology

## 2021-10-16 NOTE — Telephone Encounter (Signed)
Pharmacy requesting medication refill. Requesting call back.  ?

## 2021-10-18 ENCOUNTER — Telehealth: Payer: Self-pay | Admitting: Gastroenterology

## 2021-10-18 NOTE — Telephone Encounter (Signed)
[facility] pharmacy [phone] requesting  refill for Pt medication amlodpine ?  ?

## 2021-10-22 MED ORDER — ESOMEPRAZOLE MAGNESIUM 40 MG PO CPDR: 40.0000 mg | DELAYED_RELEASE_CAPSULE | Freq: Every day | ORAL | 0 refills | Status: DC

## 2021-12-17 ENCOUNTER — Ambulatory Visit: Payer: Medicare HMO

## 2021-12-17 ENCOUNTER — Other Ambulatory Visit: Payer: Self-pay | Admitting: Internal Medicine

## 2021-12-17 DIAGNOSIS — Z1231 Encounter for screening mammogram for malignant neoplasm of breast: Secondary | ICD-10-CM

## 2021-12-19 ENCOUNTER — Ambulatory Visit (INDEPENDENT_AMBULATORY_CARE_PROVIDER_SITE_OTHER): Payer: Medicare HMO

## 2021-12-19 VITALS — BP 134/68 | HR 74 | Ht 62.0 in | Wt 121.0 lb

## 2021-12-19 DIAGNOSIS — Z0000 Encounter for general adult medical examination without abnormal findings: Secondary | ICD-10-CM

## 2021-12-19 NOTE — Patient Instructions (Addendum)
?  Crystal Kidd , ?Thank you for taking time to come for your Medicare Wellness Visit. I appreciate your ongoing commitment to your health goals. Please review the following plan we discussed and let me know if I can assist you in the future.  ? ?These are the goals we discussed: ? Goals   ? ?  ? Patient Stated  ?   Weight goal 120lb (pt-stated)   ?   Healthy diet ?Stay active ?  ? ?  ?  ?This is a list of the screening recommended for you and due dates:  ?Health Maintenance  ?Topic Date Due  ? COVID-19 Vaccine (4 - Booster for Pfizer series) 08/18/2020  ? Mammogram  01/18/2022  ? Flu Shot  03/12/2022  ? Colon Cancer Screening  04/28/2023  ? Tetanus Vaccine  05/11/2024  ? Pneumonia Vaccine  Completed  ? DEXA scan (bone density measurement)  Completed  ? Hepatitis C Screening: USPSTF Recommendation to screen - Ages 37-79 yo.  Completed  ? Zoster (Shingles) Vaccine  Completed  ? HPV Vaccine  Aged Out  ?  ?

## 2021-12-19 NOTE — Progress Notes (Addendum)
Subjective:   Crystal Kidd is a 76 y.o. female who presents for Medicare Annual (Subsequent) preventive examination.  Review of Systems    No ROS.  Medicare Wellness Virtual Visit.  Visual/audio telehealth visit, UTA vital signs.   See social history for additional risk factors.   Cardiac Risk Factors include: advanced age (>61men, >57 women)     Objective:    Today's Vitals   12/19/21 1016  BP: 134/68  Pulse: 74  Weight: 121 lb (54.9 kg)  Height: 5\' 2"  (1.575 m)   Body mass index is 22.13 kg/m.     12/19/2021   10:36 AM 12/14/2020    2:47 PM 12/14/2019    9:51 AM 10/19/2019    4:00 AM 10/17/2019    5:01 PM 10/01/2019    8:22 PM 12/09/2018    1:39 PM  Advanced Directives  Does Patient Have a Medical Advance Directive? Yes Yes No Yes Yes No;Yes No  Type of Estate agent of [place];Living will Healthcare Power of [place];Living will  Healthcare Power of [employer] Power of Attorney Living will;Healthcare Power of Attorney   Does patient want to make changes to medical advance directive? No - Patient declined No - Patient declined Yes (MAU/Ambulatory/Procedural Areas - Information given) No - Patient declined     Copy of Healthcare Power of Attorney in Chart? No - copy requested No - copy requested  No - copy requested No - copy requested, Physician notified No - copy requested   Would patient like information on creating a medical advance directive?    No - Patient declined No - Patient declined No - Patient declined No - Patient declined    Current Medications (verified) Outpatient Encounter Medications as of 12/19/2021  Medication Sig   Alum Hydroxide-Mag Carbonate (GAVISCON PO) Take 1 capsule by mouth daily.   amLODipine (NORVASC) 5 MG tablet TAKE 1 TABLET EVERY DAY   carboxymethylcellul-glycerin (REFRESH OPTIVE) 0.5-0.9 % ophthalmic solution Place 1 drop into both eyes as needed for dry eyes.   esomeprazole (NEXIUM) 40 MG capsule Take 1  capsule (40 mg total) by mouth daily before breakfast.   famotidine (PEPCID) 20 MG tablet Take 1 tablet (20 mg total) by mouth daily. Prior to dinner   gabapentin (NEURONTIN) 100 MG capsule TAKE 1 CAPSULE (100 MG TOTAL) BY MOUTH 3 (THREE) TIMES DAILY. EVERY 8 HOURS AS NEEDED (Patient taking differently: Take 100 mg by mouth 3 (three) times daily.)   hydrochlorothiazide (HYDRODIURIL) 25 MG tablet Take 1 tablet (25 mg total) by mouth daily.   Inulin (FIBER CHOICE PO) Take 1 tablet by mouth daily as needed.   levothyroxine (SYNTHROID) 75 MCG tablet TAKE 1 TABLET BY MOUTH EVERY DAY BEFORE BREAKFAST   ondansetron (ZOFRAN-ODT) 4 MG disintegrating tablet TAKE 1 TABLET BY MOUTH EVERY 8 HOURS AS NEEDED FOR NAUSEA AND VOMITING   Peppermint Oil (IBGARD PO) Take by mouth.   polyethylene glycol (MIRALAX / GLYCOLAX) 17 g packet Take 17 g by mouth daily.   simvastatin (ZOCOR) 20 MG tablet TAKE 1 TABLET (20 MG TOTAL) BY MOUTH EVERY EVENING.   No facility-administered encounter medications on file as of 12/19/2021.    Allergies (verified) Besivance [besifloxacin hcl], Polymyxin b, Alendronate sodium, Dilaudid [hydromorphone hcl], Codeine, Flonase [fluticasone], and Ibandronate sodium   History: Past Medical History:  Diagnosis Date   Allergic rhinoconjunctivitis    Allergy 1992   Arthritis    Cataract    Diverticulosis    Eosinophilic esophagitis  GERD (gastroesophageal reflux disease)    PMH of esophageal stricture   Hyperlipidemia    Hypertension    Osteopenia    BMD @[facility]    Thyroid disease    hypothyroidism   Transfusion history 1968   post partum   Trapezius muscle spasm 04/13/2015   Vitiligo    Past Surgical History:  Procedure Laterality Date   ABDOMINAL HYSTERECTOMY     for dysfunctional menses; USO with TAH   APPENDECTOMY     BREAST EXCISIONAL BIOPSY Right 1972   NEG   BREAST EXCISIONAL BIOPSY Left 1980'S   NEG   BREAST SURGERY     biopsy X 2   CHOLECYSTECTOMY N/A 04/30/2016    Procedure: LAPAROSCOPIC CHOLECYSTECTOMY;  Surgeon: Violeta Gelinas, MD;  Location: [facility] OR;  Service: General;  Laterality: N/A;   COLONOSCOPY     X2; Tics; Dr Russella Dar, GI   ESOPHAGEAL DILATION     X2   ESOPHAGOGASTRODUODENOSCOPY (EGD) WITH PROPOFOL N/A 10/19/2019   Procedure: ESOPHAGOGASTRODUODENOSCOPY (EGD) WITH PROPOFOL;  Surgeon: Napoleon Form, MD;  Location: [facility] ENDOSCOPY;  Service: Endoscopy;  Laterality: N/A;   EYE SURGERY     cataracts removed   FRACTURE SURGERY  2003or 2004   ROTATOR CUFF REPAIR     R shoulder   TUBAL LIGATION     befor hysterectomy   UPPER GASTROINTESTINAL ENDOSCOPY     Family History  Problem Relation Age of Onset   Stomach cancer Mother    Breast cancer Mother    Emphysema Father    Breast cancer Sister        mammograms @ [facility]   Colon polyps Sister    Coronary artery disease Brother    COPD Brother    Colon polyps Brother    Liver disease Brother    Colon cancer Maternal Aunt    Cancer Maternal Aunt 65       colon cA   Diabetes Neg Hx    Stroke Neg Hx    Heart disease Neg Hx    Esophageal cancer Neg Hx    Rectal cancer Neg Hx    Social History   Socioeconomic History   Marital status: Widowed    Spouse name: Not on file   Number of children: Not on file   Years of education: Not on file   Highest education level: Not on file  Occupational History   Not on file  Tobacco Use   Smoking status: Never   Smokeless tobacco: Never  Vaping Use   Vaping Use: Never used  Substance and Sexual Activity   Alcohol use: Yes    Comment: have wine 1 glass every 6 months   Drug use: No   Sexual activity: Not Currently  Other Topics Concern   Not on file  Social History Narrative   Not on file   Social Determinants of Health   Financial Resource Strain: Low Risk    Difficulty of Paying Living Expenses: Not hard at all  Food Insecurity: No Food Insecurity   Worried About Programme researcher, broadcasting/film/video in the Last Year: Never true   Ran Out of Food in  the Last Year: Never true  Transportation Needs: No Transportation Needs   Lack of Transportation (Medical): No   Lack of Transportation (Non-Medical): No  Physical Activity: Sufficiently Active   Days of Exercise per Week: 5 days   Minutes of Exercise per Session: 30 min  Stress: No Stress Concern Present   Feeling of Stress :  Not at all  Social Connections: Unknown   Frequency of Communication with Friends and Family: More than three times a week   Frequency of Social Gatherings with Friends and Family: More than three times a week   Attends Religious Services: Not on file   Active Member of Clubs or Organizations: Not on file   Attends Banker Meetings: Not on file   Marital Status: Widowed    Tobacco Counseling Counseling given: Not Answered   Clinical Intake:  Pre-visit preparation completed: Yes        Diabetes: No  How often do you need to have someone help you when you read instructions, pamphlets, or other written materials from your doctor or pharmacy?: 1 - Never    Interpreter Needed?: No      Activities of Daily Living    12/19/2021   10:20 AM  In your present state of health, do you have any difficulty performing the following activities:  Hearing? 0  Vision? 0  Difficulty concentrating or making decisions? 0  Walking or climbing stairs? 0  Dressing or bathing? 0  Doing errands, shopping? 0  Preparing Food and eating ? N  Using the Toilet? N  In the past six months, have you accidently leaked urine? N  Do you have problems with loss of bowel control? N  Managing your Medications? N  Managing your Finances? N  Housekeeping or managing your Housekeeping? N    Patient Care Team: Sherlene Shams, MD as PCP - General (Internal Medicine)  Indicate any recent Medical Services you may have received from other than [facility] providers in the past year (date may be approximate).     Assessment:   This is a routine wellness examination for  L[place]onda.  Virtual Visit via Telephone Note  I connected with  Britta Mccreedy on 12/19/21 at 10:15 AM EDT by telephone and verified that I am speaking with the correct person using two identifiers.  Persons participating in the virtual visit: patient/Nurse Health Advisor   I discussed the limitations of performing an evaluation and management service by telehealth. The patient expressed understanding and agreed to proceed. We continued and completed visit with audio only. Some vital signs may be absent or patient reported.   March BP readings: 126/69, 130/70 P70 April BP readings: 121/69, 125/78, 143/72 P70  Hearing/Vision screen Hearing Screening - Comments:: Patient is able to hear conversational tones without difficulty. No issues reported. Vision Screening - Comments:: Followed by Dr. Jimmey Ralph Wears reader lenses  Cataract extraction, bilateral  They have seen their ophthalmologist in the last 12 months.   Dietary issues and exercise activities discussed: Current Exercise Habits: Home exercise routine, Type of exercise: walking;calisthenics, Intensity: Moderate Healthy diet Good water intake   Goals Addressed               This Visit's Progress     Patient Stated     Weight goal 120lb (pt-stated)   On track     Healthy diet Stay active       Depression Screen    12/19/2021   10:33 AM 10/15/2021    2:18 PM 08/20/2021    9:32 AM 05/16/2021    9:01 AM 02/08/2021    8:27 AM 12/14/2020    2:51 PM 12/14/2019    9:52 AM  PHQ 2/9 Scores  PHQ - 2 Score 0 0 0 0 0 0 0  Exception Documentation     Patient refusal  Fall Risk    12/19/2021   10:39 AM 10/15/2021    2:18 PM 08/20/2021    9:32 AM 05/16/2021    9:01 AM 02/08/2021    8:15 AM  Fall Risk   Falls in the past year? 0 0 0 0 0  Number falls in past yr: 0      Risk for fall due to :  No Fall Risks History of fall(s)    Follow up Falls evaluation completed Falls evaluation completed Falls evaluation completed Falls  evaluation completed Falls evaluation completed    FALL RISK PREVENTION PERTAINING TO THE HOME:  Home free of loose throw rugs in walkways, pet beds, electrical cords, etc? Yes  Adequate lighting in your home to reduce risk of falls? Yes   ASSISTIVE DEVICES UTILIZED TO PREVENT FALLS: Life alert? No  Use of a cane, walker or w/c? No   TIMED UP AND GO: Was the test performed? No .   Cognitive Function: Patient is alert and oriented x3.     12/14/2019    9:57 AM 12/01/2017   12:09 PM 11/29/2016   11:24 AM  MMSE - Mini Mental State Exam  Not completed: Unable to complete    Orientation to time  5 5  Orientation to Place  5 5  Registration  3 3  Attention/ Calculation  5 5  Recall  3 3  Language- name 2 objects  2 2  Language- repeat  1 1  Language- follow 3 step command  3 3  Language- read & follow direction  1 1  Write a sentence  1 1  Copy design  1 1  Total score  30 30        12/14/2020    3:08 PM 12/09/2018    1:43 PM  6CIT Screen  What Year? 0 points 0 points  What month? 0 points 0 points  What time? 0 points 0 points  Count back from 20  0 points  Months in reverse  0 points  Repeat phrase  0 points  Total Score  0 points   Immunizations Immunization History  Administered Date(s) Administered   Fluad Quad(high Dose 65+) 04/26/2019, 06/02/2020, 05/16/2021   H1N1 08/31/2008   Influenza Split 05/22/2011, 04/29/2012   Influenza Whole 08/12/2002, 05/05/2008, 05/16/2009, 05/21/2010   Influenza, High Dose Seasonal PF 04/12/2013, 04/09/2016, 04/10/2017, 05/25/2018   Influenza,inj,Quad PF,6+ Mos 04/20/2014, 05/02/2015   PFIZER(Purple Top)SARS-COV-2 Vaccination 09/01/2019, 09/20/2019, 06/23/2020   Pneumococcal Conjugate-13 06/13/2014   Pneumococcal Polysaccharide-23 06/21/2010, 11/30/2015   Td 04/13/2004   Tdap 05/11/2014   Zoster Recombinat (Shingrix) 06/30/2019, 12/28/2019   Zoster, Live 07/26/2009   Screening Tests Health Maintenance  Topic Date Due    COVID-19 Vaccine (4 - Booster for Pfizer series) 01/04/2022 (Originally 08/18/2020)   MAMMOGRAM  01/18/2022   INFLUENZA VACCINE  03/12/2022   COLONOSCOPY (Pts 45-54yrs Insurance coverage will need to be confirmed)  04/28/2023   TETANUS/TDAP  05/11/2024   Pneumonia Vaccine 57+ Years old  Completed   DEXA SCAN  Completed   Hepatitis C Screening  Completed   Zoster Vaccines- Shingrix  Completed   HPV VACCINES  Aged Out   Health Maintenance There are no preventive care reminders to display for this patient.  Mammogram- scheduled 01/21/22.  Lung Cancer Screening: (Low Dose CT Chest recommended if Age 68-80 years, 30 pack-year currently smoking OR have quit w/in 15years.) does not qualify.   Vision Screening: Recommended annual ophthalmology exams for early detection of glaucoma  and other disorders of the eye.  Dental Screening: Recommended annual dental exams for proper oral hygiene  Community Resource Referral / Chronic Care Management: CRR required this visit?  No   CCM required this visit?  No      Plan:   Keep all routine maintenance appointments.   I have personally reviewed and noted the following in the patient's chart:   Medical and social history Use of alcohol, tobacco or illicit drugs  Current medications and supplements including opioid prescriptions.  Functional ability and status Nutritional status Physical activity Advanced directives List of other physicians Hospitalizations, surgeries, and ER visits in previous 12 months Vitals Screenings to include cognitive, depression, and falls Referrals and appointments  In addition, I have reviewed and discussed with patient certain preventive protocols, quality metrics, and best practice recommendations. A written personalized care plan for preventive services as well as general preventive health recommendations were provided to patient.     OBrien-Blaney, Jaeveon Ashland L, LPN   1/61/0960    I have reviewed the above  information and agree with above.   Duncan Dull, MD

## 2021-12-21 ENCOUNTER — Other Ambulatory Visit: Payer: Self-pay | Admitting: Internal Medicine

## 2021-12-24 NOTE — Telephone Encounter (Signed)
Pt called in requesting refill on medication (levothyroxine (SYNTHROID) 75 MCG tablet)... Pt requesting callback  ?

## 2022-01-07 ENCOUNTER — Other Ambulatory Visit: Payer: Self-pay | Admitting: Gastroenterology

## 2022-01-21 ENCOUNTER — Ambulatory Visit
Admission: RE | Admit: 2022-01-21 | Discharge: 2022-01-21 | Disposition: A | Discharge status: Home / Self Care | Payer: Medicare HMO | Source: Ambulatory Visit | Attending: Internal Medicine | Admitting: Internal Medicine

## 2022-01-21 DIAGNOSIS — Z1231 Encounter for screening mammogram for malignant neoplasm of breast: Secondary | ICD-10-CM | POA: Diagnosis not present

## 2022-01-22 ENCOUNTER — Other Ambulatory Visit: Payer: Self-pay | Admitting: Physician Assistant

## 2022-01-22 ENCOUNTER — Other Ambulatory Visit: Payer: Self-pay | Admitting: Internal Medicine

## 2022-01-22 DIAGNOSIS — N63 Unspecified lump in unspecified breast: Secondary | ICD-10-CM

## 2022-01-22 DIAGNOSIS — R928 Other abnormal and inconclusive findings on diagnostic imaging of breast: Secondary | ICD-10-CM

## 2022-01-23 ENCOUNTER — Other Ambulatory Visit: Payer: Self-pay | Admitting: Internal Medicine

## 2022-01-25 ENCOUNTER — Telehealth: Payer: Self-pay | Admitting: Internal Medicine

## 2022-01-25 NOTE — Telephone Encounter (Signed)
Spoke with pt to let her know that the orders have been placed for the [place] and [place] will give her a call to scheduled, however I advised her that she could go ahead and give them a call on Monday morning. Pt gave a verbal understanding.

## 2022-01-25 NOTE — Telephone Encounter (Signed)
The patient called regarding her mammogram results . She stated that she saw on Mychart that she has a mass and wants to know when will her ultrasound be scheduled for follow.

## 2022-01-29 ENCOUNTER — Ambulatory Visit
Admission: RE | Admit: 2022-01-29 | Discharge: 2022-01-29 | Disposition: A | Discharge status: Home / Self Care | Payer: Medicare HMO | Source: Ambulatory Visit | Attending: Internal Medicine | Admitting: Internal Medicine

## 2022-01-29 DIAGNOSIS — N63 Unspecified lump in unspecified breast: Secondary | ICD-10-CM | POA: Diagnosis not present

## 2022-01-29 DIAGNOSIS — N6041 Mammary duct ectasia of right breast: Secondary | ICD-10-CM | POA: Diagnosis not present

## 2022-01-29 DIAGNOSIS — R928 Other abnormal and inconclusive findings on diagnostic imaging of breast: Secondary | ICD-10-CM | POA: Insufficient documentation

## 2022-01-29 DIAGNOSIS — R922 Inconclusive mammogram: Secondary | ICD-10-CM | POA: Diagnosis not present

## 2022-02-09 ENCOUNTER — Encounter: Payer: Self-pay | Admitting: Internal Medicine

## 2022-02-11 NOTE — Telephone Encounter (Signed)
Spoke with pt and scheduled her to see Dr. Derrel Nip on 02/13/2022.

## 2022-02-13 ENCOUNTER — Encounter: Payer: Self-pay | Admitting: Internal Medicine

## 2022-02-13 ENCOUNTER — Ambulatory Visit (INDEPENDENT_AMBULATORY_CARE_PROVIDER_SITE_OTHER): Payer: Medicare HMO | Admitting: Internal Medicine

## 2022-02-13 VITALS — BP 164/80 | HR 85 | Temp 98.0°F | Resp 21 | Ht 62.0 in | Wt 115.8 lb

## 2022-02-13 DIAGNOSIS — I700 Atherosclerosis of aorta: Secondary | ICD-10-CM

## 2022-02-13 DIAGNOSIS — R140 Abdominal distension (gaseous): Secondary | ICD-10-CM

## 2022-02-13 DIAGNOSIS — R6881 Early satiety: Secondary | ICD-10-CM

## 2022-02-13 DIAGNOSIS — R1319 Other dysphagia: Secondary | ICD-10-CM | POA: Diagnosis not present

## 2022-02-13 DIAGNOSIS — F458 Other somatoform disorders: Secondary | ICD-10-CM

## 2022-02-13 DIAGNOSIS — E441 Mild protein-calorie malnutrition: Secondary | ICD-10-CM | POA: Diagnosis not present

## 2022-02-13 DIAGNOSIS — I10 Essential (primary) hypertension: Secondary | ICD-10-CM | POA: Diagnosis not present

## 2022-02-13 MED ORDER — ESOMEPRAZOLE MAGNESIUM 40 MG PO CPDR
40.0000 mg | DELAYED_RELEASE_CAPSULE | Freq: Every day | ORAL | 0 refills | Status: DC
Start: 2022-02-13 — End: 2022-03-26

## 2022-02-13 NOTE — Patient Instructions (Signed)
Take the Mylanta Gas  before you eat . This will coat your esophagus and provide simethicone for the gas buildup.   Continue esomeprazole and famotidine   I will order a barium swallow and a gastric emptying study    Go ahead and call  Dr.  Allen Norris    Instead of fiber choice try getting liquid colace  or milk of magnesium (liquid)

## 2022-02-13 NOTE — Progress Notes (Signed)
Subjective:  Patient ID: Crystal Kidd, female    DOB: 1945-10-09  Age: 76 y.o. MRN: [id number]  CC: The primary encounter diagnosis was Malnutrition of mild degree ([place]). Diagnoses of Thoracic aortic atherosclerosis ([place]), Aerophagia, Esophageal dysphagia, Early satiety, Postprandial abdominal bloating, and Elevated blood pressure reading in office with white coat syndrome, with diagnosis of hypertension were also pertinent to this visit.   HPI Crystal Kidd presents for  Chief Complaint  Patient presents with   Follow-up    Difficulty swallowing   76 yr old female with history of recurrent  esophageal stricture S/p dilation in 2019,  seen again on 2020 EGD which also noted eosinophilic esophagitis by biopsy,  EE no longer treated due to development of  candida esophagitis in July 2021 after using a steroid inhaler., presents with recurrent esophageal dysphagia.  Last EGD March 2021:  no strictures were noted , normal EGD.  Aerophagia by barium swallow and dysmotility. Has not seen GI since January 23 2021.   Recently her symptoms have returned and she has had food get stuck for hours.  she has had a  6 lb weight loss since March visit .  Has been using apple sauce to take larger pills.  Occasionally even the smaller ones do not pass directly without stopping and causing symptoms   She is intentionally eating slowly and notes that the episodes of dysphagia occur more often when she is feeling full.  Recent episode  finally  resolved with swallowing milk soaked corn flake s. Aerophagia  IBS:  she notes an improvement using IBGARD helping her bowels.  Uses fiberchoice prn /   HTN:  Hypertension: patient checks blood pressure twice weekly at home.  Readings have been for the most part > 140/80 at rest . Patient is following a reduce salt diet most days and is taking medications as prescribed   Aortic atherosclerosis:  Reviewed findings of prior CT scan today..  Patient is tolerating high  potency statin therapy    Outpatient Medications Prior to Visit  Medication Sig Dispense Refill   Alum Hydroxide-Mag Carbonate (GAVISCON PO) Take 1 capsule by mouth daily.     amLODipine (NORVASC) 5 MG tablet TAKE 1 TABLET EVERY DAY 90 tablet 3   carboxymethylcellul-glycerin (REFRESH OPTIVE) 0.5-0.9 % ophthalmic solution Place 1 drop into both eyes as needed for dry eyes.     famotidine (PEPCID) 20 MG tablet Take 1 tablet (20 mg total) by mouth daily. Prior to dinner 90 tablet 1   gabapentin (NEURONTIN) 100 MG capsule TAKE 1 CAPSULE (100 MG TOTAL) BY MOUTH 3 (THREE) TIMES DAILY. EVERY 8 HOURS AS NEEDED (Patient taking differently: Take 100 mg by mouth 3 (three) times daily.) 90 capsule 1   hydrochlorothiazide (HYDRODIURIL) 25 MG tablet Take 1 tablet (25 mg total) by mouth daily. 90 tablet 1   Inulin (FIBER CHOICE PO) Take 1 tablet by mouth daily as needed.     levothyroxine (SYNTHROID) 75 MCG tablet TAKE 1 TABLET EVERY DAY BEFORE BREAKFAST 90 tablet 1   ondansetron (ZOFRAN-ODT) 4 MG disintegrating tablet TAKE 1 TABLET BY MOUTH EVERY 8 HOURS AS NEEDED FOR NAUSEA AND VOMITING 20 tablet 3   Peppermint Oil (IBGARD PO) Take by mouth.     polyethylene glycol (MIRALAX / GLYCOLAX) 17 g packet Take 17 g by mouth daily.     simvastatin (ZOCOR) 20 MG tablet TAKE 1 TABLET EVERY EVENING 90 tablet 3   esomeprazole (NEXIUM) 40 MG capsule Take 1  capsule (40 mg total) by mouth daily before breakfast. MUST SCHEDULE OFFICE VISIT 90 capsule 0   No facility-administered medications prior to visit.    Review of Systems;  Patient denies headache, fevers, malaise, unintentional weight loss, skin rash, eye pain, sinus congestion and sinus pain, sore throat, dysphagia,  hemoptysis , cough, dyspnea, wheezing, chest pain, palpitations, orthopnea, edema, abdominal pain, nausea, melena, diarrhea, constipation, flank pain, dysuria, hematuria, urinary  Frequency, nocturia, numbness, tingling, seizures,  Focal weakness, Loss  of consciousness,  Tremor, insomnia, depression, anxiety, and suicidal ideation.      Objective:  BP (!) 164/80 (BP Location: Left Arm, Patient Position: Sitting, Cuff Size: Normal)   Pulse 85   Temp 98 F (36.7 C) (Oral)   Resp (!) 21   Ht '5\' 2"'$  (1.575 m)   Wt 115 lb 12.8 oz (52.5 kg)   SpO2 95%   BMI 21.18 kg/m   BP Readings from Last 3 Encounters:  02/13/22 (!) 164/80  12/19/21 134/68  10/15/21 (!) 160/80    Wt Readings from Last 3 Encounters:  02/13/22 115 lb 12.8 oz (52.5 kg)  12/19/21 121 lb (54.9 kg)  10/15/21 121 lb 6.4 oz (55.1 kg)    General appearance: alert, cooperative and appears stated age Ears: normal TM's and external ear canals both ears Throat: lips, mucosa, and tongue normal; teeth and gums normal Neck: no adenopathy, no carotid bruit, supple, symmetrical, trachea midline and thyroid not enlarged, symmetric, no tenderness/mass/nodules Back: symmetric, no curvature. ROM normal. No CVA tenderness. Lungs: clear to auscultation bilaterally Heart: regular rate and rhythm, S1, S2 normal, no murmur, click, rub or gallop Abdomen: soft, non-tender; bowel sounds normal; no masses,  no organomegaly Pulses: 2+ and symmetric Skin: Skin color, texture, turgor normal. No rashes or lesions Lymph nodes: Cervical, supraclavicular, and axillary nodes normal.  Lab Results  Component Value Date   HGBA1C 5.4 02/08/2021   HGBA1C 5.5 06/20/2017   HGBA1C 5.6 08/25/2012    Lab Results  Component Value Date   CREATININE 0.84 08/16/2021   CREATININE 0.78 02/08/2021   CREATININE 0.86 12/12/2020    Lab Results  Component Value Date   WBC 4.5 12/12/2020   HGB 12.4 12/12/2020   HCT 37.1 12/12/2020   PLT 243.0 12/12/2020   GLUCOSE 94 08/16/2021   CHOL 133 08/16/2021   TRIG 75.0 08/16/2021   HDL 50.90 08/16/2021   LDLDIRECT 79.0 11/30/2015   LDLCALC 67 08/16/2021   ALT 13 08/16/2021   AST 20 08/16/2021   NA 140 08/16/2021   K 4.3 08/16/2021   CL 106 08/16/2021    CREATININE 0.84 08/16/2021   BUN 12 08/16/2021   CO2 29 08/16/2021   TSH 2.36 08/16/2021   HGBA1C 5.4 02/08/2021    MM DIAG BREAST TOMO UNI RIGHT  Result Date: 01/29/2022 CLINICAL DATA:  Recall from screening mammography, possible mass or asymmetry in the lower inner subareolar RIGHT breast. EXAM: DIGITAL DIAGNOSTIC UNILATERAL RIGHT MAMMOGRAM WITH TOMOSYNTHESIS AND CAD; ULTRASOUND RIGHT BREAST LIMITED TECHNIQUE: Right digital diagnostic mammography and breast tomosynthesis was performed. The images were evaluated with computer-aided detection.; Targeted ultrasound examination of the right breast was performed. COMPARISON:  Previous exam(s). ACR Breast Density Category c: The breast tissue is heterogeneously dense, which may obscure small masses. FINDINGS: Spot-compression CC and MLO views of the area of concern in the full field mediolateral view were obtained. Spot compression views demonstrate likely mild duct ectasia in the lower inner subareolar location corresponding to the screening mammographic finding.  There is no visible mass. There is no associated architectural distortion or suspicious calcifications. Targeted ultrasound is performed, demonstrating mild duct ectasia in the lower inner subareolar location with imaging from 4 o'clock through 6 o'clock. There is no intraductal mass or intraductal soft tissue. No cyst, solid mass or abnormal acoustic shadowing is identified. IMPRESSION: 1. No mammographic or sonographic evidence of malignancy involving the RIGHT breast. 2. Benign duct ectasia in the lower inner subareolar location. RECOMMENDATION: Screening mammogram in one year.(Code:SM-B-01Y) I have discussed the findings and recommendations with the patient. If applicable, a reminder letter will be sent to the patient regarding the next appointment. BI-RADS CATEGORY  2: Benign. Electronically Signed   By: Evangeline Dakin M.D.   On: 01/29/2022 12:01   US BREAST LTD UNI RIGHT INC  AXILLA  Result Date: 01/29/2022 CLINICAL DATA:  Recall from screening mammography, possible mass or asymmetry in the lower inner subareolar RIGHT breast. EXAM: DIGITAL DIAGNOSTIC UNILATERAL RIGHT MAMMOGRAM WITH TOMOSYNTHESIS AND CAD; ULTRASOUND RIGHT BREAST LIMITED TECHNIQUE: Right digital diagnostic mammography and breast tomosynthesis was performed. The images were evaluated with computer-aided detection.; Targeted ultrasound examination of the right breast was performed. COMPARISON:  Previous exam(s). ACR Breast Density Category c: The breast tissue is heterogeneously dense, which may obscure small masses. FINDINGS: Spot-compression CC and MLO views of the area of concern in the full field mediolateral view were obtained. Spot compression views demonstrate likely mild duct ectasia in the lower inner subareolar location corresponding to the screening mammographic finding. There is no visible mass. There is no associated architectural distortion or suspicious calcifications. Targeted ultrasound is performed, demonstrating mild duct ectasia in the lower inner subareolar location with imaging from 4 o'clock through 6 o'clock. There is no intraductal mass or intraductal soft tissue. No cyst, solid mass or abnormal acoustic shadowing is identified. IMPRESSION: 1. No mammographic or sonographic evidence of malignancy involving the RIGHT breast. 2. Benign duct ectasia in the lower inner subareolar location. RECOMMENDATION: Screening mammogram in one year.(Code:SM-B-01Y) I have discussed the findings and recommendations with the patient. If applicable, a reminder letter will be sent to the patient regarding the next appointment. BI-RADS CATEGORY  2: Benign. Electronically Signed   By: Evangeline Dakin M.D.   On: 01/29/2022 12:01   Assessment & Plan:   Problem List Items Addressed This Visit     Malnutrition of mild degree ([place]) - Primary   Aerophagia   Relevant Orders   DG SWALLOW FUNC SPEECH PATH   Elevated  blood pressure reading in office with white coat syndrome, with diagnosis of hypertension    Well controlled on current regimen based oh home readings on a validated machine. . Renal function stable, no changes today.      Thoracic aortic atherosclerosis ([place])    Managed with statin.  She  has no side effects and liver enzymes are normal. No changes today.  Lab Results  Component Value Date   CHOL 133 08/16/2021   HDL 50.90 08/16/2021   LDLCALC 67 08/16/2021   LDLDIRECT 79.0 11/30/2015   TRIG 75.0 08/16/2021   CHOLHDL 3 08/16/2021   Lab Results  Component Value Date   ALT 13 08/16/2021   AST 20 08/16/2021   ALKPHOS 85 08/16/2021   BILITOT 0.6 08/16/2021         Esophageal dysphagia    Symptoms have recurred and are accompanied by unintentional weight loss.  She notes that symptoms are aggravated by fullness. Barium swallow and gastric emptying study ordered.Marland Kitchen  Follow up with GI      Relevant Orders   DG SWALLOW [place]   Other Visit Diagnoses     Early satiety       Relevant Orders   NM Gastric Emptying   Postprandial abdominal bloating           I spent a total of  32 minutes with this patient in a face to face visit on the date of this encounter reviewing the last office visit with me  in March,  her ,  most recent with patient's gastroenterologist in June 2022, last EGD ,  patient's diet and eating habits, home blood pressure readings ,  most recent imaging study ,   and post visit ordering of testing and therapeutics.    Follow-up: Return in about 4 weeks (around 03/13/2022).   [name], MD

## 2022-02-17 NOTE — Assessment & Plan Note (Signed)
Managed with statin.  She  has no side effects and liver enzymes are normal. No changes today.  Lab Results  Component Value Date   CHOL 133 08/16/2021   HDL 50.90 08/16/2021   LDLCALC 67 08/16/2021   LDLDIRECT 79.0 11/30/2015   TRIG 75.0 08/16/2021   CHOLHDL 3 08/16/2021   Lab Results  Component Value Date   ALT 13 08/16/2021   AST 20 08/16/2021   ALKPHOS 85 08/16/2021   BILITOT 0.6 08/16/2021

## 2022-02-17 NOTE — Assessment & Plan Note (Signed)
Symptoms have recurred and are accompanied by unintentional weight loss.  She notes that symptoms are aggravated by fullness. Barium swallow and gastric emptying study ordered..  Follow up with GI

## 2022-02-17 NOTE — Assessment & Plan Note (Signed)
Well controlled on current regimen based oh home readings on a validated machine. . Renal function stable, no changes today.

## 2022-02-20 NOTE — Addendum Note (Signed)
Addended by: [name] on: 02/20/2022 08:58 AM   Modules accepted: Orders

## 2022-02-27 ENCOUNTER — Ambulatory Visit: Payer: Medicare HMO

## 2022-02-27 ENCOUNTER — Other Ambulatory Visit: Payer: Medicare HMO

## 2022-02-27 ENCOUNTER — Ambulatory Visit
Admission: RE | Admit: 2022-02-27 | Discharge: 2022-02-27 | Disposition: A | Discharge status: Home / Self Care | Payer: Medicare HMO | Source: Ambulatory Visit | Attending: Internal Medicine | Admitting: Internal Medicine

## 2022-02-27 DIAGNOSIS — K224 Dyskinesia of esophagus: Secondary | ICD-10-CM | POA: Diagnosis not present

## 2022-02-27 DIAGNOSIS — K219 Gastro-esophageal reflux disease without esophagitis: Secondary | ICD-10-CM | POA: Diagnosis not present

## 2022-02-27 DIAGNOSIS — R1310 Dysphagia, unspecified: Secondary | ICD-10-CM | POA: Diagnosis not present

## 2022-02-27 DIAGNOSIS — R1319 Other dysphagia: Secondary | ICD-10-CM | POA: Insufficient documentation

## 2022-03-01 ENCOUNTER — Telehealth: Payer: Self-pay

## 2022-03-01 NOTE — Telephone Encounter (Signed)
Spoke to Patient about her Lab Results.  Patient voiced understanding.

## 2022-03-05 ENCOUNTER — Other Ambulatory Visit: Payer: Medicare HMO

## 2022-03-08 ENCOUNTER — Encounter
Admission: RE | Admit: 2022-03-08 | Discharge: 2022-03-08 | Disposition: A | Discharge status: Home / Self Care | Payer: Medicare HMO | Source: Ambulatory Visit | Attending: Internal Medicine | Admitting: Internal Medicine

## 2022-03-08 DIAGNOSIS — R6881 Early satiety: Secondary | ICD-10-CM | POA: Insufficient documentation

## 2022-03-08 MED ORDER — TECHNETIUM TC 99M SULFUR COLLOID
2.0000 | Freq: Once | INTRAVENOUS | Status: AC | PRN
  Administered 2022-03-08: 2.44 via ORAL

## 2022-03-15 ENCOUNTER — Encounter: Payer: Self-pay | Admitting: Internal Medicine

## 2022-03-15 ENCOUNTER — Ambulatory Visit (INDEPENDENT_AMBULATORY_CARE_PROVIDER_SITE_OTHER): Payer: Medicare HMO | Admitting: Internal Medicine

## 2022-03-15 VITALS — BP 138/76 | HR 70 | Temp 98.7°F | Ht 62.0 in | Wt 117.4 lb

## 2022-03-15 DIAGNOSIS — E441 Mild protein-calorie malnutrition: Secondary | ICD-10-CM

## 2022-03-15 DIAGNOSIS — E034 Atrophy of thyroid (acquired): Secondary | ICD-10-CM | POA: Diagnosis not present

## 2022-03-15 DIAGNOSIS — R634 Abnormal weight loss: Secondary | ICD-10-CM | POA: Diagnosis not present

## 2022-03-15 DIAGNOSIS — K224 Dyskinesia of esophagus: Secondary | ICD-10-CM | POA: Diagnosis not present

## 2022-03-15 DIAGNOSIS — E785 Hyperlipidemia, unspecified: Secondary | ICD-10-CM

## 2022-03-15 DIAGNOSIS — K2100 Gastro-esophageal reflux disease with esophagitis, without bleeding: Secondary | ICD-10-CM | POA: Diagnosis not present

## 2022-03-15 LAB — LDL CHOLESTEROL, DIRECT: Direct LDL: 66 mg/dL

## 2022-03-15 LAB — CBC WITH DIFFERENTIAL/PLATELET
Basophils Absolute: 0.1 10*3/uL (ref 0.0–0.1)
Basophils Relative: 1.9 % (ref 0.0–3.0)
Eosinophils Absolute: 0.2 10*3/uL (ref 0.0–0.7)
Eosinophils Relative: 5.3 % — ABNORMAL HIGH (ref 0.0–5.0)
HCT: 39.9 % (ref 36.0–46.0)
Hemoglobin: 13.3 g/dL (ref 12.0–15.0)
Lymphocytes Relative: 18.8 % (ref 12.0–46.0)
Lymphs Abs: 0.8 10*3/uL (ref 0.7–4.0)
MCHC: 33.3 g/dL (ref 30.0–36.0)
MCV: 88.7 fl (ref 78.0–100.0)
Monocytes Absolute: 0.5 10*3/uL (ref 0.1–1.0)
Monocytes Relative: 12.2 % — ABNORMAL HIGH (ref 3.0–12.0)
Neutro Abs: 2.5 10*3/uL (ref 1.4–7.7)
Neutrophils Relative %: 61.8 % (ref 43.0–77.0)
Platelets: 218 10*3/uL (ref 150.0–400.0)
RBC: 4.5 Mil/uL (ref 3.87–5.11)
RDW: 13.6 % (ref 11.5–15.5)
WBC: 4.1 10*3/uL (ref 4.0–10.5)

## 2022-03-15 LAB — LIPID PANEL
Cholesterol: 139 mg/dL (ref 0–200)
HDL: 50.1 mg/dL (ref >39.00)
LDL Cholesterol: 65 mg/dL (ref 0–99)
NonHDL: 89.2
Total CHOL/HDL Ratio: 3
Triglycerides: 123 mg/dL (ref 0.0–149.0)
VLDL: 24.6 mg/dL (ref 0.0–40.0)

## 2022-03-15 LAB — COMPREHENSIVE METABOLIC PANEL
ALT: 11 U/L (ref 0–35)
AST: 18 U/L (ref 0–37)
Albumin: 4 g/dL (ref 3.5–5.2)
Alkaline Phosphatase: 85 U/L (ref 39–117)
BUN: 10 mg/dL (ref 6–23)
CO2: 32 mEq/L (ref 19–32)
Calcium: 9.5 mg/dL (ref 8.4–10.5)
Chloride: 106 mEq/L (ref 96–112)
Creatinine, Ser: 0.71 mg/dL (ref 0.40–1.20)
GFR: 82.41 mL/min (ref >60.00)
Glucose, Bld: 73 mg/dL (ref 70–99)
Potassium: 4.2 mEq/L (ref 3.5–5.1)
Sodium: 140 mEq/L (ref 135–145)
Total Bilirubin: 0.5 mg/dL (ref 0.2–1.2)
Total Protein: 6.3 g/dL (ref 6.0–8.3)

## 2022-03-15 LAB — TSH: TSH: 1 u[IU]/mL (ref 0.35–5.50)

## 2022-03-15 NOTE — Progress Notes (Signed)
Subjective:  Patient ID: Crystal Kidd, female    DOB: 1946/07/06  Age: 76 y.o. MRN: [id number]  CC: The primary encounter diagnosis was Recent unintentional weight loss over several months. Diagnoses of Hypothyroidism due to acquired atrophy of thyroid, Hyperlipidemia LDL goal <100, Esophageal dysmotility, Malnutrition of mild degree ([place]), and Gastroesophageal reflux disease with esophagitis without hemorrhage were also pertinent to this visit.   HPI Crystal Kidd presents for  follow up on recent tests to evaluate swallow function in the setting of recurrent abdominal distension, dysphagia and unintentional weight loss.   Chief Complaint  Patient presents with   Follow-up    Discuss swallow & emptying test   1) Bloating:  Gastric emptying study was normal (100% emptied at one hour) .  Discussed the likelihood that the bloating is due to excessive gas.  She is trying to eat more slowly and avoid talking during eating.  She has been taking myalnta gas BEFORE she eats. She continues to feel the need to belch and at times forces herself to belch to relieve the feeling of food being stuch   2) Dysphagia;  feeling is mostly with pills,  even small ones.   barium swallow noted no stricture or mass,  no hiatal hernia,  some spontaneous reflux which did not reach beyond the lower third of the esophagus,  and esophageal dysmotility. Patient declined to swallow the barium tablet.   She has had days where she has no problems with swallow and is supplementing diet with high calori smoothies.  She has gained 2 lbs since July 5.  Appt with I is in October.  Taking nexium in the am and famotidine pre dinner     Outpatient Medications Prior to Visit  Medication Sig Dispense Refill   Alum Hydroxide-Mag Carbonate (GAVISCON PO) Take 1 capsule by mouth daily.     amLODipine (NORVASC) 5 MG tablet TAKE 1 TABLET EVERY DAY 90 tablet 3   carboxymethylcellul-glycerin (REFRESH OPTIVE) 0.5-0.9 % ophthalmic  solution Place 1 drop into both eyes as needed for dry eyes.     esomeprazole (NEXIUM) 40 MG capsule Take 1 capsule (40 mg total) by mouth daily before breakfast. 90 capsule 0   famotidine (PEPCID) 20 MG tablet Take 1 tablet (20 mg total) by mouth daily. Prior to dinner 90 tablet 1   gabapentin (NEURONTIN) 100 MG capsule TAKE 1 CAPSULE (100 MG TOTAL) BY MOUTH 3 (THREE) TIMES DAILY. EVERY 8 HOURS AS NEEDED (Patient taking differently: Take 100 mg by mouth 3 (three) times daily.) 90 capsule 1   hydrochlorothiazide (HYDRODIURIL) 25 MG tablet Take 1 tablet (25 mg total) by mouth daily. 90 tablet 1   Inulin (FIBER CHOICE PO) Take 1 tablet by mouth daily as needed.     levothyroxine (SYNTHROID) 75 MCG tablet TAKE 1 TABLET EVERY DAY BEFORE BREAKFAST 90 tablet 1   ondansetron (ZOFRAN-ODT) 4 MG disintegrating tablet TAKE 1 TABLET BY MOUTH EVERY 8 HOURS AS NEEDED FOR NAUSEA AND VOMITING 20 tablet 3   Peppermint Oil (IBGARD PO) Take by mouth.     simvastatin (ZOCOR) 20 MG tablet TAKE 1 TABLET EVERY EVENING 90 tablet 3   polyethylene glycol (MIRALAX / GLYCOLAX) 17 g packet Take 17 g by mouth daily. (Patient not taking: Reported on 03/15/2022)     No facility-administered medications prior to visit.    Review of Systems;  Patient denies headache, fevers, malaise, unintentional weight loss, skin rash, eye pain, sinus congestion and sinus pain, sore  throat, dysphagia,  hemoptysis , cough, dyspnea, wheezing, chest pain, palpitations, orthopnea, edema, abdominal pain, nausea, melena, diarrhea, constipation, flank pain, dysuria, hematuria, urinary  Frequency, nocturia, numbness, tingling, seizures,  Focal weakness, Loss of consciousness,  Tremor, insomnia, depression, anxiety, and suicidal ideation.      Objective:  BP 138/76 (BP Location: Left Arm, Patient Position: Sitting, Cuff Size: Normal)   Pulse 70   Temp 98.7 F (37.1 C) (Oral)   Ht '5\' 2"'$  (1.575 m)   Wt 117 lb 6.4 oz (53.3 kg)   SpO2 99%   BMI 21.47  kg/m   BP Readings from Last 3 Encounters:  03/15/22 138/76  02/13/22 (!) 164/80  12/19/21 134/68    Wt Readings from Last 3 Encounters:  03/15/22 117 lb 6.4 oz (53.3 kg)  02/13/22 115 lb 12.8 oz (52.5 kg)  12/19/21 121 lb (54.9 kg)    General appearance: alert, cooperative and appears stated age Ears: normal TM's and external ear canals both ears Throat: lips, mucosa, and tongue normal; teeth and gums normal Neck: no adenopathy, no carotid bruit, supple, symmetrical, trachea midline and thyroid not enlarged, symmetric, no tenderness/mass/nodules Back: symmetric, no curvature. ROM normal. No CVA tenderness. Lungs: clear to auscultation bilaterally Heart: regular rate and rhythm, S1, S2 normal, no murmur, click, rub or gallop Abdomen: soft, non-tender; bowel sounds normal; no masses,  no organomegaly Pulses: 2+ and symmetric Skin: Skin color notable for macular hypopigmented areas on neck arms and torso.  texture, turgor normal. No rashes or lesions Lymph nodes: Cervical, supraclavicular, and axillary nodes normal.  Lab Results  Component Value Date   HGBA1C 5.4 02/08/2021   HGBA1C 5.5 06/20/2017   HGBA1C 5.6 08/25/2012    Lab Results  Component Value Date   CREATININE 0.84 08/16/2021   CREATININE 0.78 02/08/2021   CREATININE 0.86 12/12/2020    Lab Results  Component Value Date   WBC 4.5 12/12/2020   HGB 12.4 12/12/2020   HCT 37.1 12/12/2020   PLT 243.0 12/12/2020   GLUCOSE 94 08/16/2021   CHOL 133 08/16/2021   TRIG 75.0 08/16/2021   HDL 50.90 08/16/2021   LDLDIRECT 79.0 11/30/2015   LDLCALC 67 08/16/2021   ALT 13 08/16/2021   AST 20 08/16/2021   NA 140 08/16/2021   K 4.3 08/16/2021   CL 106 08/16/2021   CREATININE 0.84 08/16/2021   BUN 12 08/16/2021   CO2 29 08/16/2021   TSH 2.36 08/16/2021   HGBA1C 5.4 02/08/2021    NM Gastric Emptying  Result Date: 03/08/2022 CLINICAL DATA:  Early satiety concern for gastroparesis. EXAM: NUCLEAR MEDICINE GASTRIC  EMPTYING SCAN TECHNIQUE: After oral ingestion of radiolabeled meal, sequential abdominal images were obtained for 3 hours. Percentage of activity emptying the stomach was calculated at 1 hour, 2 hour, and 3 hours. RADIOPHARMACEUTICALS:  2.44 mCi Tc-20msulfur colloid in standardized meal COMPARISON:  Nuclear medicine gastric emptying study September 22, 2020 FINDINGS: Expected location of the stomach in the left upper quadrant. Ingested meal empties the stomach over the course of the study. 100% emptied at 1 hr ( normal >= 10%) 100% emptied at 2 hr ( normal >= 40%) 100% emptied at 3 hr ( normal >= 70%) IMPRESSION: No scintigraphic evidence of delayed gastric emptying. Electronically Signed   By: JDahlia BailiffM.D.   On: 03/08/2022 14:37    Assessment & Plan:   Problem List Items Addressed This Visit     Malnutrition of mild degree ([place]    She has been  supplementing diet with high calorie smoothies and has gained 2 lbs over the past 30 days       Hypothyroidism   Relevant Orders   TSH   Hyperlipidemia LDL goal <100   Relevant Orders   Lipid panel   Direct LDL   GERD (gastroesophageal reflux disease)    Continue nexium qam and famotidine qp m       Esophageal dysmotility    Reviewed Barium swallow with ptient and the lifestyle modifications needed.        Other Visit Diagnoses     Recent unintentional weight loss over several months    -  Primary   Relevant Orders   Comprehensive metabolic panel   CBC with Differential/Platelet       I spent a total of    36  minutes with this patient in a face to face visit on the date of this encounter reviewing the last office visit with me on   July 5, most recent  endoscopy,   ,  patient'ss diet and eating habits,  recent gastric emptying study and barium swallow,  and counselling on her motility and reflux and globus and weight loss.     Follow-up: No follow-ups on file.   [name], MD

## 2022-03-15 NOTE — Assessment & Plan Note (Signed)
She has been supplementing diet with high calorie smoothies and has gained 2 lbs over the past 30 days

## 2022-03-15 NOTE — Patient Instructions (Addendum)
Do not use mylanta gas before eating  . It may be filling you up.  Use it IF NEEDED FOR BLOATING DUE TO GAS   YOUR STOMACH EMPTIES PERFECTLY.  THE BLOATING IS DUE TO GAS    Continue nexium in the morning and famotidine (pepcid) before dinner   You must eat slowly  and be mindful of taking in air when you take a bite  Try taking all of your meds (EXCEPT THE THYROID) WITH MILK

## 2022-03-15 NOTE — Assessment & Plan Note (Signed)
Continue nexium qam and famotidine qp m

## 2022-03-15 NOTE — Assessment & Plan Note (Signed)
Reviewed Barium swallow with ptient and the lifestyle modifications needed.

## 2022-03-26 ENCOUNTER — Other Ambulatory Visit: Payer: Self-pay | Admitting: Internal Medicine

## 2022-04-06 ENCOUNTER — Other Ambulatory Visit: Payer: Self-pay | Admitting: Internal Medicine

## 2022-04-12 ENCOUNTER — Ambulatory Visit
Admission: EM | Admit: 2022-04-12 | Discharge: 2022-04-12 | Disposition: A | Discharge status: Home / Self Care | Payer: Medicare HMO | Attending: Family Medicine | Admitting: Family Medicine

## 2022-04-12 DIAGNOSIS — L089 Local infection of the skin and subcutaneous tissue, unspecified: Secondary | ICD-10-CM

## 2022-04-12 DIAGNOSIS — S90421A Blister (nonthermal), right great toe, initial encounter: Secondary | ICD-10-CM

## 2022-04-12 MED ORDER — DEXAMETHASONE SODIUM PHOSPHATE 10 MG/ML IJ SOLN
10.0000 mg | Freq: Once | INTRAMUSCULAR | Status: AC
  Administered 2022-04-12: 10 mg via INTRAMUSCULAR

## 2022-04-12 MED ORDER — DOXYCYCLINE MONOHYDRATE 100 MG PO CAPS
100.0000 mg | ORAL_CAPSULE | Freq: Two times a day (BID) | ORAL | 0 refills | Status: AC
Start: 2022-04-12 — End: 2022-04-19

## 2022-04-12 MED ORDER — FLUCONAZOLE 150 MG PO TABS: 150.0000 mg | ORAL_TABLET | Freq: Once | ORAL | 0 refills | Status: DC | PRN

## 2022-04-12 NOTE — Discharge Instructions (Signed)
Complete entire course of medication.  After showering ensure your foot is dry between the toes to prevent worsening of infection. Return if symptoms worsen or do not completely improve following treatment.

## 2022-04-12 NOTE — ED Provider Notes (Signed)
Roderic Palau    CSN: [id number] Arrival date & time: 04/12/22  1343      History   Chief Complaint Chief Complaint  Patient presents with  . Insect Bite    HPI Crystal Kidd is a 76 y.o. female.   HPI  Past Medical History:  Diagnosis Date  . Allergic rhinoconjunctivitis   . Allergy 1992  . Arthritis   . Cataract   . Diverticulosis   . Eosinophilic esophagitis   . GERD (gastroesophageal reflux disease)    PMH of esophageal stricture  . Hyperlipidemia   . Hypertension   . Osteopenia    BMD [facility]   . Thyroid disease    hypothyroidism  . Transfusion history 1968   post partum  . Trapezius muscle spasm 04/13/2015  . Vitiligo     Patient Active Problem List   Diagnosis Date Noted  . Esophageal dysmotility 03/15/2022  . Malnutrition of mild degree ([place]) 02/13/2022  . IBS (irritable bowel syndrome) 08/20/2021  . Aerophagia 05/17/2021  . Impaired fasting glucose 02/10/2021  . COVID-19 vaccine administered 02/10/2021  . Skin lesion 01/15/2021  . Personal history of colonic polyps 07/20/2019  . Tubular adenoma of colon 02/24/2019  . History of vertigo 06/26/2018  . Family history of breast cancer in mother 12/21/2016  . S/P laparoscopic cholecystectomy 06/04/2016  . Hospital discharge follow-up 05/15/2016  . Thoracic aortic atherosclerosis ([place]) 01/02/2016  . Encounter for preventive health examination 06/14/2014  . Pain in joint, shoulder region 12/06/2013  . S/P hysterectomy with oophorectomy 12/06/2013  . Cataract 12/06/2013  . Transfusion history   . Cervical disc disorder with radiculopathy of cervical region 05/21/2010  . Esophageal dysphagia 05/31/2009  . Vitamin D deficiency 05/16/2009  . Hypothyroidism 12/26/2007  . Hyperlipidemia LDL goal <100 12/26/2007  . Elevated blood pressure reading in office with white coat syndrome, with diagnosis of hypertension 12/26/2007  . GERD (gastroesophageal reflux disease) 12/26/2007  . Diverticulosis of  large intestine 12/26/2007  . VITILIGO 12/31/2006  . Osteoporosis 12/31/2006    Past Surgical History:  Procedure Laterality Date  . ABDOMINAL HYSTERECTOMY     for dysfunctional menses; USO with TAH  . APPENDECTOMY    . BREAST EXCISIONAL BIOPSY Right 1972   NEG  . BREAST EXCISIONAL BIOPSY Left 1980'S   NEG  . BREAST SURGERY     biopsy X 2  . CHOLECYSTECTOMY N/A 04/30/2016   Procedure: LAPAROSCOPIC CHOLECYSTECTOMY;  Surgeon: Georganna Skeans, MD;  Location: [facility];  Service: General;  Laterality: N/A;  . COLONOSCOPY     X2; Tics; Dr Fuller Plan, GI  . ESOPHAGEAL DILATION     X2  . ESOPHAGOGASTRODUODENOSCOPY (EGD) WITH PROPOFOL N/A 10/19/2019   Procedure: ESOPHAGOGASTRODUODENOSCOPY (EGD) WITH PROPOFOL;  Surgeon: Mauri Pole, MD;  Location: [facility];  Service: Endoscopy;  Laterality: N/A;  . EYE SURGERY     cataracts removed  . FRACTURE SURGERY  2003or 2004  . ROTATOR CUFF REPAIR     R shoulder  . TUBAL LIGATION     befor hysterectomy  . UPPER GASTROINTESTINAL ENDOSCOPY      OB History   No obstetric history on file.      Home Medications    Prior to Admission medications   Medication Sig Start Date End Date Taking? Authorizing Provider  Alum Hydroxide-Mag Carbonate (GAVISCON PO) Take 1 capsule by mouth daily.    [provider]  amLODipine (NORVASC) 5 MG tablet TAKE 1 TABLET EVERY DAY 09/06/21   Deborra Medina  L, MD  carboxymethylcellul-glycerin (REFRESH OPTIVE) 0.5-0.9 % ophthalmic solution Place 1 drop into both eyes as needed for dry eyes.    [provider]  esomeprazole (NEXIUM) 40 MG capsule TAKE 1 CAPSULE EVERY DAY BEFORE BREAKFAST 03/26/22   [name], MD  famotidine (PEPCID) 20 MG tablet TAKE 1 TABLET (20 MG TOTAL) BY MOUTH DAILY. PRIOR TO DINNER 04/07/22   Dutch Quint B, FNP  gabapentin (NEURONTIN) 100 MG capsule TAKE 1 CAPSULE (100 MG TOTAL) BY MOUTH 3 (THREE) TIMES DAILY. EVERY 8 HOURS AS NEEDED Patient taking differently: Take 100 mg  by mouth 3 (three) times daily. 04/22/19   [name], MD  hydrochlorothiazide (HYDRODIURIL) 25 MG tablet Take 1 tablet (25 mg total) by mouth daily. 10/26/19   [name], MD  Inulin (FIBER CHOICE PO) Take 1 tablet by mouth daily as needed.    [provider]  levothyroxine (SYNTHROID) 75 MCG tablet TAKE 1 TABLET EVERY DAY BEFORE BREAKFAST 12/24/21   [name], MD  ondansetron (ZOFRAN-ODT) 4 MG disintegrating tablet TAKE 1 TABLET BY MOUTH EVERY 8 HOURS AS NEEDED FOR NAUSEA AND VOMITING 08/07/21   [name], MD  Peppermint Oil (IBGARD PO) Take by mouth.    [provider]  simvastatin (ZOCOR) 20 MG tablet TAKE 1 TABLET EVERY EVENING 01/24/22   [name], MD    Family History Family History  Problem Relation Age of Onset  . Stomach cancer Mother   . Breast cancer Mother   . Emphysema Father   . Breast cancer Sister        mammograms @ [facility]  . Colon polyps Sister   . Coronary artery disease Brother   . COPD Brother   . Colon polyps Brother   . Liver disease Brother   . Colon cancer Maternal Aunt   . Cancer Maternal Aunt 80       colon cA  . Diabetes Neg Hx   . Stroke Neg Hx   . Heart disease Neg Hx   . Esophageal cancer Neg Hx   . Rectal cancer Neg Hx     Social History Social History   Tobacco Use  . Smoking status: Never  . Smokeless tobacco: Never  Vaping Use  . Vaping Use: Never used  Substance Use Topics  . Alcohol use: Yes    Comment: have wine 1 glass every 6 months  . Drug use: No     Allergies   Besivance [besifloxacin hcl], Polymyxin b, Alendronate sodium, Dilaudid [hydromorphone hcl], Codeine, Flonase [fluticasone], and Ibandronate sodium   Review of Systems Review of Systems   Physical Exam Triage Vital Signs ED Triage Vitals  Enc Vitals Group     BP 04/12/22 1517 (!) 157/84     Pulse Rate 04/12/22 1517 74     Resp 04/12/22 1517 16     Temp 04/12/22 1517 97.7 F (36.5 C)     Temp Source 04/12/22 1517  Temporal     SpO2 04/12/22 1517 98 %     Weight --      Height --      Head Circumference --      Peak Flow --      Pain Score 04/12/22 1516 0     Pain Loc --      Pain Edu? --      Excl. in [place]? --    No data found.  Updated Vital Signs BP (!) 157/84 (BP Location: Left  Arm)   Pulse 74   Temp 97.7 F (36.5 C) (Temporal)   Resp 16   SpO2 98%   Visual Acuity Right Eye Distance:   Left Eye Distance:   Bilateral Distance:    Right Eye Near:   Left Eye Near:    Bilateral Near:     Physical Exam   UC Treatments / Results  Labs (all labs ordered are listed, but only abnormal results are displayed) Labs Reviewed - No data to display  EKG   Radiology No results found.  Procedures Procedures (including critical care time)  Medications Ordered in UC Medications - No data to display  Initial Impression / Assessment and Plan / UC Course  I have reviewed the triage vital signs and the nursing notes.  Pertinent labs & imaging results that were available during my care of the patient were reviewed by me and considered in my medical decision making (see chart for details).     *** Final Clinical Impressions(s) / UC Diagnoses   Final diagnoses:  None   Discharge Instructions   None    ED Prescriptions   None    PDMP not reviewed this encounter.

## 2022-04-12 NOTE — ED Triage Notes (Signed)
Patient presents to UC for a blister between her right big toe and 4th toe. States she noted it weds and has been using itch cream with no relief. Pt states she went to pharmacy and they instructed her to come in for possible infection.

## 2022-04-17 ENCOUNTER — Encounter: Payer: Self-pay | Admitting: Internal Medicine

## 2022-04-19 ENCOUNTER — Encounter: Payer: Self-pay | Admitting: Internal Medicine

## 2022-04-19 ENCOUNTER — Ambulatory Visit (INDEPENDENT_AMBULATORY_CARE_PROVIDER_SITE_OTHER): Payer: Medicare HMO | Admitting: Internal Medicine

## 2022-04-19 VITALS — BP 132/68 | HR 85 | Temp 98.0°F | Resp 16 | Ht 62.0 in | Wt 121.5 lb

## 2022-04-19 DIAGNOSIS — K224 Dyskinesia of esophagus: Secondary | ICD-10-CM | POA: Diagnosis not present

## 2022-04-19 DIAGNOSIS — R1319 Other dysphagia: Secondary | ICD-10-CM

## 2022-04-19 DIAGNOSIS — E034 Atrophy of thyroid (acquired): Secondary | ICD-10-CM

## 2022-04-19 DIAGNOSIS — L03031 Cellulitis of right toe: Secondary | ICD-10-CM | POA: Diagnosis not present

## 2022-04-19 DIAGNOSIS — H9193 Unspecified hearing loss, bilateral: Secondary | ICD-10-CM

## 2022-04-19 DIAGNOSIS — Z23 Encounter for immunization: Secondary | ICD-10-CM

## 2022-04-19 DIAGNOSIS — E441 Mild protein-calorie malnutrition: Secondary | ICD-10-CM

## 2022-04-19 DIAGNOSIS — E785 Hyperlipidemia, unspecified: Secondary | ICD-10-CM | POA: Diagnosis not present

## 2022-04-19 NOTE — Progress Notes (Unsigned)
Subjective:  Patient ID: Crystal Kidd, female    DOB: 09-05-1945  Age: 76 y.o. MRN: [id number]  CC: The primary encounter diagnosis was Hypothyroidism due to acquired atrophy of thyroid. Diagnoses of Hyperlipidemia LDL goal <100, Flu vaccine need, Bilateral hearing loss, unspecified hearing loss type, Esophageal dysphagia, Cellulitis of great toe of right foot, Malnutrition of mild degree ([place]), and Esophageal dysmotility were also pertinent to this visit.   HPI Crystal Kidd presents for ER follow up ,  month follow up on chronic conditions   Chief Complaint  Patient presents with   Follow-up    6 month follow up  Pt would like her ears checked Went to ER last week for a possible spider bite would like to have it looked at  Right foot in between big toe     1) reviewed recent  urgent care visit for presumed  "spider bite" on foot  which occurred last week,  she  developed intense itching on Wednesday, , went to turgent care on Sept 1 and was note to have developed a bullous lesion between her toes  and surrounding  cellulitis of foot.  Doxycycline prescribed and bullous lesion  was aspirated.  She has finished the doxycycline  as today and all redness has resolved.  Eating yogurt daily  denies diarrhea . However, she reports a feeling of head pressure accompanied by feeling "weird" on Saturday, and symptoms persisted through  Monday ,  started feeling better on Tuesday.  No fever,  no rhinitis.  No lip or tongue swelling,  no vertigo.   2) bilateral fullness in ears   3) Dysphagia;  improved symptoms,  weight is stable. Still burping a lot. (Note today during visit )  Outpatient Medications Prior to Visit  Medication Sig Dispense Refill   Alum Hydroxide-Mag Carbonate (GAVISCON PO) Take 1 capsule by mouth daily.     amLODipine (NORVASC) 5 MG tablet TAKE 1 TABLET EVERY DAY 90 tablet 3   carboxymethylcellul-glycerin (REFRESH OPTIVE) 0.5-0.9 % ophthalmic solution Place 1 drop into both  eyes as needed for dry eyes.     doxycycline (MONODOX) 100 MG capsule Take 1 capsule (100 mg total) by mouth 2 (two) times daily for 7 days. 14 capsule 0   esomeprazole (NEXIUM) 40 MG capsule TAKE 1 CAPSULE EVERY DAY BEFORE BREAKFAST 90 capsule 0   famotidine (PEPCID) 20 MG tablet TAKE 1 TABLET (20 MG TOTAL) BY MOUTH DAILY. PRIOR TO DINNER 90 tablet 1   gabapentin (NEURONTIN) 100 MG capsule TAKE 1 CAPSULE (100 MG TOTAL) BY MOUTH 3 (THREE) TIMES DAILY. EVERY 8 HOURS AS NEEDED 90 capsule 1   hydrochlorothiazide (HYDRODIURIL) 25 MG tablet Take 1 tablet (25 mg total) by mouth daily. 90 tablet 1   Inulin (FIBER CHOICE PO) Take 1 tablet by mouth daily as needed.     levothyroxine (SYNTHROID) 75 MCG tablet TAKE 1 TABLET EVERY DAY BEFORE BREAKFAST 90 tablet 1   ondansetron (ZOFRAN-ODT) 4 MG disintegrating tablet TAKE 1 TABLET BY MOUTH EVERY 8 HOURS AS NEEDED FOR NAUSEA AND VOMITING 20 tablet 3   Peppermint Oil (IBGARD PO) Take by mouth.     simvastatin (ZOCOR) 20 MG tablet TAKE 1 TABLET EVERY EVENING 90 tablet 3   fluconazole (DIFLUCAN) 150 MG tablet Take 1 tablet (150 mg total) by mouth once as needed for up to 1 dose (vaginal irritation associatd with antibiotic use). Repeat if needed (Patient not taking: Reported on 04/17/2022) 1 tablet 0  No facility-administered medications prior to visit.    Review of Systems;  Patient denies headache, fevers, malaise, unintentional weight loss, skin rash, eye pain, sinus congestion and sinus pain, sore throat, dysphagia,  hemoptysis , cough, dyspnea, wheezing, chest pain, palpitations, orthopnea, edema, abdominal pain, nausea, melena, diarrhea, constipation, flank pain, dysuria, hematuria, urinary  Frequency, nocturia, numbness, tingling, seizures,  Focal weakness, Loss of consciousness,  Tremor, insomnia, depression, anxiety, and suicidal ideation.      Objective:  BP 132/68   Pulse 85   Temp 98 F (36.7 C) (Oral)   Resp 16   Ht '5\' 2"'$  (1.575 m)   Wt 121  lb 8 oz (55.1 kg)   SpO2 96%   BMI 22.22 kg/m   BP Readings from Last 3 Encounters:  04/19/22 132/68  04/12/22 (!) 157/84  03/15/22 138/76    Wt Readings from Last 3 Encounters:  04/19/22 121 lb 8 oz (55.1 kg)  03/15/22 117 lb 6.4 oz (53.3 kg)  02/13/22 115 lb 12.8 oz (52.5 kg)    General appearance: alert, cooperative and appears stated age Ears: normal TM's and external ear canals both ears Throat: lips, mucosa, and tongue normal; teeth and gums normal Neck: no adenopathy, no carotid bruit, supple, symmetrical, trachea midline and thyroid not enlarged, symmetric, no tenderness/mass/nodules Back: symmetric, no curvature. ROM normal. No CVA tenderness. Lungs: clear to auscultation bilaterally Heart: regular rate and rhythm, S1, S2 normal, no murmur, click, rub or gallop Abdomen: soft, non-tender; bowel sounds normal; no masses,  no organomegaly Pulses: 2+ and symmetric Skin: Skin color, texture, turgor normal. No rashes or lesions Lymph nodes: Cervical, supraclavicular, and axillary nodes normal. Foot exam:  normal bilaterally except for small healing pustule between toes 1 and  2  Neuro:  awake and interactive with normal mood and affect. Higher cortical functions are normal. Speech is clear without word-finding difficulty or dysarthria. Extraocular movements are intact. Visual fields of both eyes are grossly intact. Sensation to light touch is grossly intact bilaterally of upper and lower extremities. Motor examination shows 4+/5 symmetric hand grip and upper extremity and 5/5 lower extremity strength. There is no pronation or drift. Gait is non-ataxic   Lab Results  Component Value Date   HGBA1C 5.4 02/08/2021   HGBA1C 5.5 06/20/2017   HGBA1C 5.6 08/25/2012    Lab Results  Component Value Date   CREATININE 0.71 03/15/2022   CREATININE 0.84 08/16/2021   CREATININE 0.78 02/08/2021    Lab Results  Component Value Date   WBC 4.1 03/15/2022   HGB 13.3 03/15/2022   HCT  39.9 03/15/2022   PLT 218.0 03/15/2022   GLUCOSE 73 03/15/2022   CHOL 139 03/15/2022   TRIG 123.0 03/15/2022   HDL 50.10 03/15/2022   LDLDIRECT 66.0 03/15/2022   LDLCALC 65 03/15/2022   ALT 11 03/15/2022   AST 18 03/15/2022   NA 140 03/15/2022   K 4.2 03/15/2022   CL 106 03/15/2022   CREATININE 0.71 03/15/2022   BUN 10 03/15/2022   CO2 32 03/15/2022   TSH 1.00 03/15/2022   HGBA1C 5.4 02/08/2021    No results found.  Assessment & Plan:   Problem List Items Addressed This Visit     Cellulitis of great toe of right foot    Presumed secondary to insect bite from donning shoes that were left outside  .  Resolved with empiic doxycycline       Esophageal dysmotility    Confirmed with recent  Barium swallow;  lifestyle modifications reviewed       Esophageal dysphagia    Subjective, with no evidence of stricture on recent DG esophagus.  She declined to swallow the barium tablet. disordered peristalsis noted on swallow evaluation       Hyperlipidemia LDL goal <100   Relevant Orders   Lipid panel   Direct LDL   Comprehensive metabolic panel   Hypothyroidism - Primary   Relevant Orders   TSH   Malnutrition of mild degree ([facility])    Secondary to weight loss due to recent recurrence of globus and aerophagia.  Improved with supplementation       Other Visit Diagnoses     Flu vaccine need       Relevant Orders   Flu vaccine HIGH DOSE PF (Fluzone High dose) (Completed)   Bilateral hearing loss, unspecified hearing loss type       Relevant Orders   Ambulatory referral to Audiology      Follow-up: Return in about 5 months (around 09/19/2022).   [name], MD

## 2022-04-19 NOTE — Patient Instructions (Signed)
Your foot infection has resolved.  You had "cellulitis" from an insect bite.   Continue eating yougurt ONCE A DAY for 2 more weeks (as your probiotic)  Postpone your flu vaccine until mid October   Wait for the new COVID vaccine to come out.

## 2022-04-21 DIAGNOSIS — L03031 Cellulitis of right toe: Secondary | ICD-10-CM | POA: Insufficient documentation

## 2022-04-21 NOTE — Assessment & Plan Note (Signed)
Subjective, with no evidence of stricture on recent DG esophagus.  She declined to swallow the barium tablet. disordered peristalsis noted on swallow evaluation

## 2022-04-21 NOTE — Assessment & Plan Note (Signed)
Secondary to weight loss due to recent recurrence of globus and aerophagia.  Improved with supplementation

## 2022-04-21 NOTE — Assessment & Plan Note (Signed)
Confirmed with recent  Barium swallow;  lifestyle modifications reviewed

## 2022-04-21 NOTE — Assessment & Plan Note (Signed)
Presumed secondary to insect bite from donning shoes that were left outside  .  Resolved with empiic doxycycline

## 2022-05-15 DIAGNOSIS — H903 Sensorineural hearing loss, bilateral: Secondary | ICD-10-CM | POA: Diagnosis not present

## 2022-05-15 DIAGNOSIS — R42 Dizziness and giddiness: Secondary | ICD-10-CM | POA: Diagnosis not present

## 2022-05-15 DIAGNOSIS — H6983 Other specified disorders of Eustachian tube, bilateral: Secondary | ICD-10-CM | POA: Diagnosis not present

## 2022-05-21 ENCOUNTER — Ambulatory Visit (INDEPENDENT_AMBULATORY_CARE_PROVIDER_SITE_OTHER): Payer: Medicare HMO

## 2022-05-21 DIAGNOSIS — Z23 Encounter for immunization: Secondary | ICD-10-CM | POA: Diagnosis not present

## 2022-06-17 DIAGNOSIS — L821 Other seborrheic keratosis: Secondary | ICD-10-CM | POA: Diagnosis not present

## 2022-06-17 DIAGNOSIS — D225 Melanocytic nevi of trunk: Secondary | ICD-10-CM | POA: Diagnosis not present

## 2022-06-17 DIAGNOSIS — L80 Vitiligo: Secondary | ICD-10-CM | POA: Diagnosis not present

## 2022-06-17 DIAGNOSIS — D1801 Hemangioma of skin and subcutaneous tissue: Secondary | ICD-10-CM | POA: Diagnosis not present

## 2022-06-17 DIAGNOSIS — L700 Acne vulgaris: Secondary | ICD-10-CM | POA: Diagnosis not present

## 2022-06-17 DIAGNOSIS — L814 Other melanin hyperpigmentation: Secondary | ICD-10-CM | POA: Diagnosis not present

## 2022-06-27 ENCOUNTER — Ambulatory Visit: Payer: Medicare HMO | Admitting: Gastroenterology

## 2022-06-27 ENCOUNTER — Encounter: Payer: Self-pay | Admitting: Gastroenterology

## 2022-06-27 VITALS — BP 162/83 | HR 94 | Temp 97.9°F | Wt 124.0 lb

## 2022-06-27 DIAGNOSIS — R1319 Other dysphagia: Secondary | ICD-10-CM | POA: Diagnosis not present

## 2022-06-27 NOTE — Progress Notes (Signed)
Primary Care Physician: [name], MD  Primary Gastroenterologist:  Dr. Lucilla Lame  Chief Complaint  Patient presents with   Gastroesophageal Reflux    Pt reports improvement with Sx since adding Pepcid and IBgard... still has intermittent burning and ongoing gas    HPI: Crystal Kidd is a 76 y.o. female here for follow-up with a history of dysphagia.  The patient has had multiple endoscopic procedures by Dr. Fuller Plan and by Dr. Silverio Decamp.  She was seen by me last year with a modified barium swallow and barium swallow showing dysmotility and paraphasia with her esophagus filled with air.  The upper endoscopies have not shown any abnormalities to explain the patient's dysphagia.  She now comes for follow-up. The patient reports that she has been doing very well without any significant dysphagia.  She does report that she has a lot of passing of gas.  She denies any weight loss and states that she has been slowly gaining weight.  There is no report of any fevers chills nausea or vomiting.  She does report that she still has some problems with some of the pills she takes.  Past Medical History:  Diagnosis Date   Allergic rhinoconjunctivitis    Allergy 1992   Arthritis    Cataract    Diverticulosis    Eosinophilic esophagitis    GERD (gastroesophageal reflux disease)    PMH of esophageal stricture   Hyperlipidemia    Hypertension    Osteopenia    BMD [facility]    Thyroid disease    hypothyroidism   Transfusion history 1968   post partum   Trapezius muscle spasm 04/13/2015   Vitiligo     Current Outpatient Medications  Medication Sig Dispense Refill   Alum Hydroxide-Mag Carbonate (GAVISCON PO) Take 1 capsule by mouth daily.     amLODipine (NORVASC) 5 MG tablet TAKE 1 TABLET EVERY DAY 90 tablet 3   carboxymethylcellul-glycerin (REFRESH OPTIVE) 0.5-0.9 % ophthalmic solution Place 1 drop into both eyes as needed for dry eyes.     esomeprazole (NEXIUM) 40 MG capsule TAKE 1 CAPSULE  EVERY DAY BEFORE BREAKFAST 90 capsule 0   famotidine (PEPCID) 20 MG tablet TAKE 1 TABLET (20 MG TOTAL) BY MOUTH DAILY. PRIOR TO DINNER 90 tablet 1   gabapentin (NEURONTIN) 100 MG capsule TAKE 1 CAPSULE (100 MG TOTAL) BY MOUTH 3 (THREE) TIMES DAILY. EVERY 8 HOURS AS NEEDED 90 capsule 1   hydrochlorothiazide (HYDRODIURIL) 25 MG tablet Take 1 tablet (25 mg total) by mouth daily. 90 tablet 1   Inulin (FIBER CHOICE PO) Take 1 tablet by mouth daily as needed.     levothyroxine (SYNTHROID) 75 MCG tablet TAKE 1 TABLET EVERY DAY BEFORE BREAKFAST 90 tablet 1   ondansetron (ZOFRAN-ODT) 4 MG disintegrating tablet TAKE 1 TABLET BY MOUTH EVERY 8 HOURS AS NEEDED FOR NAUSEA AND VOMITING 20 tablet 3   Peppermint Oil (IBGARD PO) Take by mouth.     simvastatin (ZOCOR) 20 MG tablet TAKE 1 TABLET EVERY EVENING 90 tablet 3   fluconazole (DIFLUCAN) 150 MG tablet Take 1 tablet (150 mg total) by mouth once as needed for up to 1 dose (vaginal irritation associatd with antibiotic use). Repeat if needed (Patient not taking: Reported on 06/27/2022) 1 tablet 0   No current facility-administered medications for this visit.    Allergies as of 06/27/2022 - Review Complete 06/27/2022  Allergen Reaction Noted   Besivance [besifloxacin hcl] Shortness Of Breath and Other (See Comments) 06/13/2014  Polymyxin b  05/16/2021   Alendronate sodium Other (See Comments) 05/21/2010   Dilaudid [hydromorphone hcl] Itching 04/30/2011   Codeine Nausea And Vomiting and Other (See Comments) 07/20/2019   Flonase [fluticasone]  10/17/2019   Ibandronate sodium Other (See Comments) 05/21/2010    ROS:  General: Negative for anorexia, weight loss, fever, chills, fatigue, weakness. ENT: Negative for hoarseness, difficulty swallowing , nasal congestion. CV: Negative for chest pain, angina, palpitations, dyspnea on exertion, peripheral edema.  Respiratory: Negative for dyspnea at rest, dyspnea on exertion, cough, sputum, wheezing.  GI: See  history of present illness. GU:  Negative for dysuria, hematuria, urinary incontinence, urinary frequency, nocturnal urination.  Endo: Negative for unusual weight change.    Physical Examination:   BP (!) 162/83 (BP Location: Left Arm, Patient Position: Sitting, Cuff Size: Normal)   Pulse 94   Temp 97.9 F (36.6 C) (Oral)   Wt 124 lb (56.2 kg)   BMI 22.68 kg/m   General: Well-nourished, well-developed in no acute distress.  Eyes: No icterus. Conjunctivae pink. Lungs: Clear to auscultation bilaterally. Non-labored. Heart: Regular rate and rhythm, no murmurs rubs or gallops.  Abdomen: Bowel sounds are normal, nontender, nondistended, no hepatosplenomegaly or masses, no abdominal bruits or hernia , no rebound or guarding.   Extremities: No lower extremity edema. No clubbing or deformities. Neuro: Alert and oriented x 3.  Grossly intact. Skin: Warm and dry, no jaundice.   Psych: Alert and cooperative, normal mood and affect.  Labs:    Imaging Studies: No results found.  Assessment and Plan:   Crystal Kidd is a 76 y.o. y/o female is in today with a history of dysphagia and is here for her yearly follow-up.  The patient has a history of polyps and is not due for colonoscopy until next year.  The patient has been told that since she is healthy and has a history of polyps that she should follow-up in 1 years time for the colonoscopy.  The patient will also contact me if her symptoms get worse and she has been told to avoid gassy and bloating foods and she should start with trying to avoid dairy products.  The patient will see if any of these modifications decrease the amount of gas she has.  The patient has been explained the plan agrees with it.     Lucilla Lame, MD. Mar[facility] Regal    Note: This dictation was prepared with Dragon dictation along with smaller phrase technology. Any transcriptional errors that result from this process are unintentional.

## 2022-07-09 DIAGNOSIS — H52223 Regular astigmatism, bilateral: Secondary | ICD-10-CM | POA: Diagnosis not present

## 2022-07-09 DIAGNOSIS — H04123 Dry eye syndrome of bilateral lacrimal glands: Secondary | ICD-10-CM | POA: Diagnosis not present

## 2022-07-09 DIAGNOSIS — H16223 Keratoconjunctivitis sicca, not specified as Sjogren's, bilateral: Secondary | ICD-10-CM | POA: Diagnosis not present

## 2022-07-09 DIAGNOSIS — H353131 Nonexudative age-related macular degeneration, bilateral, early dry stage: Secondary | ICD-10-CM | POA: Diagnosis not present

## 2022-07-09 DIAGNOSIS — H10413 Chronic giant papillary conjunctivitis, bilateral: Secondary | ICD-10-CM | POA: Diagnosis not present

## 2022-08-11 ENCOUNTER — Encounter: Payer: Self-pay | Admitting: Internal Medicine

## 2022-09-05 ENCOUNTER — Telehealth: Payer: Self-pay | Admitting: Internal Medicine

## 2022-09-05 DIAGNOSIS — U071 COVID-19: Secondary | ICD-10-CM | POA: Insufficient documentation

## 2022-09-05 NOTE — Telephone Encounter (Signed)
Pt tested positive today but symptoms started on Tuesday 09/03/22. She reports that symptoms are improving. Symptoms present today are head feels full and ears stopped up.  No fever.  BP was 147/75 Pulse 94.    Taking the following OTCs: Coricidin HBP cold and flu, Cepacol for sore throat.   This is the first time she's had COVID, has had all her COVID shots. Pt is asking if she needs antivirals?

## 2022-09-05 NOTE — Assessment & Plan Note (Signed)
Positive test Tuesday jan 23.  Fully vaccinated.  Symptoms mild  no antiviral needed

## 2022-09-05 NOTE — Telephone Encounter (Signed)
Patient called and said she tested positive for COVID today. She started feeling bad on Tuesday. She is feeling better today, just has a stuffy nose and head congestion, no fever. She has been taking medication for symptoms, and drinking a lot of water. She wanted to know if she need the antiviral mediation.

## 2022-09-06 NOTE — Telephone Encounter (Signed)
Patient called, note read. Patient is feeling better.

## 2022-09-12 ENCOUNTER — Other Ambulatory Visit: Payer: Self-pay | Admitting: Internal Medicine

## 2022-09-23 ENCOUNTER — Other Ambulatory Visit: Payer: Self-pay | Admitting: Internal Medicine

## 2022-10-05 ENCOUNTER — Other Ambulatory Visit: Payer: Self-pay | Admitting: Family

## 2022-10-18 ENCOUNTER — Encounter: Payer: Self-pay | Admitting: Internal Medicine

## 2022-10-18 ENCOUNTER — Ambulatory Visit (INDEPENDENT_AMBULATORY_CARE_PROVIDER_SITE_OTHER): Payer: Medicare HMO | Admitting: Internal Medicine

## 2022-10-18 VITALS — BP 130/80 | HR 77 | Temp 98.1°F | Resp 15 | Ht 62.0 in | Wt 129.6 lb

## 2022-10-18 DIAGNOSIS — K582 Mixed irritable bowel syndrome: Secondary | ICD-10-CM

## 2022-10-18 DIAGNOSIS — E034 Atrophy of thyroid (acquired): Secondary | ICD-10-CM | POA: Diagnosis not present

## 2022-10-18 DIAGNOSIS — M810 Age-related osteoporosis without current pathological fracture: Secondary | ICD-10-CM

## 2022-10-18 DIAGNOSIS — Z1231 Encounter for screening mammogram for malignant neoplasm of breast: Secondary | ICD-10-CM | POA: Diagnosis not present

## 2022-10-18 DIAGNOSIS — E785 Hyperlipidemia, unspecified: Secondary | ICD-10-CM

## 2022-10-18 DIAGNOSIS — I700 Atherosclerosis of aorta: Secondary | ICD-10-CM

## 2022-10-18 DIAGNOSIS — Z1211 Encounter for screening for malignant neoplasm of colon: Secondary | ICD-10-CM | POA: Diagnosis not present

## 2022-10-18 DIAGNOSIS — D369 Benign neoplasm, unspecified site: Secondary | ICD-10-CM

## 2022-10-18 DIAGNOSIS — E559 Vitamin D deficiency, unspecified: Secondary | ICD-10-CM | POA: Diagnosis not present

## 2022-10-18 DIAGNOSIS — E441 Mild protein-calorie malnutrition: Secondary | ICD-10-CM | POA: Diagnosis not present

## 2022-10-18 LAB — COMPREHENSIVE METABOLIC PANEL
ALT: 13 U/L (ref 0–35)
AST: 19 U/L (ref 0–37)
Albumin: 3.8 g/dL (ref 3.5–5.2)
Alkaline Phosphatase: 91 U/L (ref 39–117)
BUN: 10 mg/dL (ref 6–23)
CO2: 30 mEq/L (ref 19–32)
Calcium: 9.4 mg/dL (ref 8.4–10.5)
Chloride: 105 mEq/L (ref 96–112)
Creatinine, Ser: 0.77 mg/dL (ref 0.40–1.20)
GFR: 74.45 mL/min (ref >60.00)
Glucose, Bld: 92 mg/dL (ref 70–99)
Potassium: 4.1 mEq/L (ref 3.5–5.1)
Sodium: 141 mEq/L (ref 135–145)
Total Bilirubin: 0.6 mg/dL (ref 0.2–1.2)
Total Protein: 6.3 g/dL (ref 6.0–8.3)

## 2022-10-18 LAB — LIPID PANEL
Cholesterol: 132 mg/dL (ref 0–200)
HDL: 57.9 mg/dL (ref >39.00)
LDL Cholesterol: 58 mg/dL (ref 0–99)
NonHDL: 74.08
Total CHOL/HDL Ratio: 2
Triglycerides: 81 mg/dL (ref 0.0–149.0)
VLDL: 16.2 mg/dL (ref 0.0–40.0)

## 2022-10-18 LAB — VITAMIN D 25 HYDROXY (VIT D DEFICIENCY, FRACTURES): VITD: 23.38 ng/mL — ABNORMAL LOW (ref 30.00–100.00)

## 2022-10-18 LAB — TSH: TSH: 1.59 u[IU]/mL (ref 0.35–5.50)

## 2022-10-18 LAB — LDL CHOLESTEROL, DIRECT: Direct LDL: 66 mg/dL

## 2022-10-18 MED ORDER — GABAPENTIN 100 MG PO CAPS
ORAL_CAPSULE | ORAL | 1 refills | Status: AC
Start: 2022-10-18 — End: ?

## 2022-10-18 MED ORDER — RALOXIFENE HCL 60 MG PO TABS
60.0000 mg | ORAL_TABLET | Freq: Every day | ORAL | 1 refills | Status: DC
Start: 2022-10-18 — End: 2023-04-21

## 2022-10-18 NOTE — Patient Instructions (Addendum)
  Your  DEXA scan  been ordered.  Please call [facility]  to schedule your  appointment ; their phone number is [phone]   Evista will be your treatment for osteoporosis.  It has been sent to mail order  I am checking your vitamin D level today  Please start taking a calcium supplement once daily if you are not doing so

## 2022-10-18 NOTE — Progress Notes (Unsigned)
Subjective:  Patient ID: Crystal Kidd, female    DOB: Dec 08, 1945  Age: 77 y.o. MRN: [id number]  CC: The primary encounter diagnosis was Encounter for screening mammogram for malignant neoplasm of breast. Diagnoses of Colon cancer screening, Age-related osteoporosis without current pathological fracture, and Malnutrition of mild degree ([place]) were also pertinent to this visit.   HPI Crystal Kidd presents for  Chief Complaint  Patient presents with   Medical Management of Chronic Issues   Hypothyroidism   Hypertension    1) H/o COVID infection in January: no fevers  just had chest tightness, sinus congestion  and dyspnea.  The cough followed a few days later.  Cough has resolved, but still feels short of breath with walking lung distances.    Took a fluid pill because she was worried about pulmonary edema as a cause of her fatigue .  Has been working in the yard and feeling   2) IBS with dyaphagia, GERD:  feeling much better using IBGARD and PPI/H2 blocker  3)     Outpatient Medications Prior to Visit  Medication Sig Dispense Refill   Alum Hydroxide-Mag Carbonate (GAVISCON PO) Take 1 capsule by mouth daily.     amLODipine (NORVASC) 5 MG tablet TAKE 1 TABLET EVERY DAY 90 tablet 3   carboxymethylcellul-glycerin (REFRESH OPTIVE) 0.5-0.9 % ophthalmic solution Place 1 drop into both eyes as needed for dry eyes.     esomeprazole (NEXIUM) 40 MG capsule TAKE 1 CAPSULE EVERY DAY BEFORE BREAKFAST 90 capsule 0   famotidine (PEPCID) 20 MG tablet TAKE 1 TABLET (20 MG TOTAL) BY MOUTH DAILY. PRIOR TO DINNER 90 tablet 1   gabapentin (NEURONTIN) 100 MG capsule TAKE 1 CAPSULE (100 MG TOTAL) BY MOUTH 3 (THREE) TIMES DAILY. EVERY 8 HOURS AS NEEDED 90 capsule 1   hydrochlorothiazide (HYDRODIURIL) 25 MG tablet Take 1 tablet (25 mg total) by mouth daily. 90 tablet 1   Inulin (FIBER CHOICE PO) Take 1 tablet by mouth daily as needed.     levothyroxine (SYNTHROID) 75 MCG tablet TAKE 1 TABLET EVERY DAY  BEFORE BREAKFAST 90 tablet 3   ondansetron (ZOFRAN-ODT) 4 MG disintegrating tablet TAKE 1 TABLET BY MOUTH EVERY 8 HOURS AS NEEDED FOR NAUSEA AND VOMITING 20 tablet 3   Peppermint Oil (IBGARD PO) Take by mouth.     simvastatin (ZOCOR) 20 MG tablet TAKE 1 TABLET EVERY EVENING 90 tablet 3   fluconazole (DIFLUCAN) 150 MG tablet Take 1 tablet (150 mg total) by mouth once as needed for up to 1 dose (vaginal irritation associatd with antibiotic use). Repeat if needed (Patient not taking: Reported on 06/27/2022) 1 tablet 0   No facility-administered medications prior to visit.    Review of Systems;  Patient denies headache, fevers, malaise, unintentional weight loss, skin rash, eye pain, sinus congestion and sinus pain, sore throat, dysphagia,  hemoptysis , cough, dyspnea, wheezing, chest pain, palpitations, orthopnea, edema, abdominal pain, nausea, melena, diarrhea, constipation, flank pain, dysuria, hematuria, urinary  Frequency, nocturia, numbness, tingling, seizures,  Focal weakness, Loss of consciousness,  Tremor, insomnia, depression, anxiety, and suicidal ideation.      Objective:  BP 130/80   Pulse 77   Temp 98.1 F (36.7 C) (Temporal)   Resp 15   Ht '5\' 2"'$  (1.575 m)   Wt 129 lb 9.6 oz (58.8 kg)   SpO2 98%   BMI 23.70 kg/m   BP Readings from Last 3 Encounters:  10/18/22 130/80  06/27/22 (!) 162/83  04/19/22 132/68  Wt Readings from Last 3 Encounters:  10/18/22 129 lb 9.6 oz (58.8 kg)  06/27/22 124 lb (56.2 kg)  04/19/22 121 lb 8 oz (55.1 kg)    Physical Exam  Lab Results  Component Value Date   HGBA1C 5.4 02/08/2021   HGBA1C 5.5 06/20/2017   HGBA1C 5.6 08/25/2012    Lab Results  Component Value Date   CREATININE 0.71 03/15/2022   CREATININE 0.84 08/16/2021   CREATININE 0.78 02/08/2021    Lab Results  Component Value Date   WBC 4.1 03/15/2022   HGB 13.3 03/15/2022   HCT 39.9 03/15/2022   PLT 218.0 03/15/2022   GLUCOSE 73 03/15/2022   CHOL 139 03/15/2022    TRIG 123.0 03/15/2022   HDL 50.10 03/15/2022   LDLDIRECT 66.0 03/15/2022   LDLCALC 65 03/15/2022   ALT 11 03/15/2022   AST 18 03/15/2022   NA 140 03/15/2022   K 4.2 03/15/2022   CL 106 03/15/2022   CREATININE 0.71 03/15/2022   BUN 10 03/15/2022   CO2 32 03/15/2022   TSH 1.00 03/15/2022   HGBA1C 5.4 02/08/2021    No results found.  Assessment & Plan:  .Encounter for screening mammogram for malignant neoplasm of breast  Colon cancer screening  Age-related osteoporosis without current pathological fracture  Malnutrition of mild degree ([place]) Assessment & Plan: Sh ehas had a weight regain of 5 lbs since last  visit with improvement in dysphagia.       I provided 30 minutes of face-to-face time during this encounter reviewing patient's last visit with me, patient's  most recent visit with cardiology,  nephrology,  and neurology,  recent surgical and non surgical procedures, previous  labs and imaging studies, counseling on currently addressed issues,  and post visit ordering to diagnostics and therapeutics .   Follow-up: No follow-ups on file.   [name], MD

## 2022-10-18 NOTE — Assessment & Plan Note (Addendum)
She has had a weight regain of 5 lbs since last  visit with improvement in dysphagia.

## 2022-10-19 NOTE — Assessment & Plan Note (Signed)
Improved symptoms including passage of stools with IBGARD

## 2022-10-19 NOTE — Assessment & Plan Note (Signed)
Reviewed options of management.   Trial of Evista agreed upon .

## 2022-10-19 NOTE — Assessment & Plan Note (Signed)
Managed with statin.  She  has no side effects and liver enzymes are normal. No changes today.  Lab Results  Component Value Date   CHOL 132 10/18/2022   HDL 57.90 10/18/2022   LDLCALC 58 10/18/2022   LDLDIRECT 66.0 10/18/2022   TRIG 81.0 10/18/2022   CHOLHDL 2 10/18/2022   Lab Results  Component Value Date   ALT 13 10/18/2022   AST 19 10/18/2022   ALKPHOS 91 10/18/2022   BILITOT 0.6 10/18/2022

## 2022-10-21 ENCOUNTER — Telehealth: Payer: Self-pay

## 2022-10-21 ENCOUNTER — Other Ambulatory Visit: Payer: Self-pay

## 2022-10-21 DIAGNOSIS — D126 Benign neoplasm of colon, unspecified: Secondary | ICD-10-CM

## 2022-10-21 NOTE — Telephone Encounter (Signed)
Gastroenterology Pre-Procedure Review  Request Date: 04/29/23 Requesting Physician: Dr. Allen Norris  PATIENT REVIEW QUESTIONS: The patient responded to the following health history questions as indicated:    1. Are you having any GI issues? no 2. Do you have a personal history of Polyps? yes (last colonoscopy performed by Dr. Fuller Plan 04/28/2019) 3. Do you have a family history of Colon Cancer or Polyps? yes (maternal aunt had colon cancer) 4. Diabetes Mellitus? no 5. Joint replacements in the past 12 months?no 6. Major health problems in the past 3 months?no 7. Any artificial heart valves, MVP, or defibrillator?no    MEDICATIONS & ALLERGIES:    Patient reports the following regarding taking any anticoagulation/antiplatelet therapy:   Plavix, Coumadin, Eliquis, Xarelto, Lovenox, Pradaxa, Brilinta, or Effient? no Aspirin? no  Patient confirms/reports the following medications:  Current Outpatient Medications  Medication Sig Dispense Refill   Alum Hydroxide-Mag Carbonate (GAVISCON PO) Take 1 capsule by mouth daily.     amLODipine (NORVASC) 5 MG tablet TAKE 1 TABLET EVERY DAY 90 tablet 3   carboxymethylcellul-glycerin (REFRESH OPTIVE) 0.5-0.9 % ophthalmic solution Place 1 drop into both eyes as needed for dry eyes.     esomeprazole (NEXIUM) 40 MG capsule TAKE 1 CAPSULE EVERY DAY BEFORE BREAKFAST 90 capsule 0   famotidine (PEPCID) 20 MG tablet TAKE 1 TABLET (20 MG TOTAL) BY MOUTH DAILY. PRIOR TO DINNER 90 tablet 1   gabapentin (NEURONTIN) 100 MG capsule TAKE 1 CAPSULE (100 MG TOTAL) BY MOUTH 3 (THREE) TIMES DAILY. EVERY 8 HOURS AS NEEDED 90 capsule 1   hydrochlorothiazide (HYDRODIURIL) 25 MG tablet Take 1 tablet (25 mg total) by mouth daily. 90 tablet 1   Inulin (FIBER CHOICE PO) Take 1 tablet by mouth daily as needed.     levothyroxine (SYNTHROID) 75 MCG tablet TAKE 1 TABLET EVERY DAY BEFORE BREAKFAST 90 tablet 3   ondansetron (ZOFRAN-ODT) 4 MG disintegrating tablet TAKE 1 TABLET BY MOUTH EVERY 8  HOURS AS NEEDED FOR NAUSEA AND VOMITING 20 tablet 3   Peppermint Oil (IBGARD PO) Take by mouth.     raloxifene (EVISTA) 60 MG tablet Take 1 tablet (60 mg total) by mouth daily. 90 tablet 1   simvastatin (ZOCOR) 20 MG tablet TAKE 1 TABLET EVERY EVENING 90 tablet 3   No current facility-administered medications for this visit.    Patient confirms/reports the following allergies:  Allergies  Allergen Reactions   Besivance [Besifloxacin Hcl] Shortness Of Breath and Other (See Comments)    Dizziness   Polymyxin B    Alendronate Sodium Other (See Comments)    REACTION: intolerant ( PMH of esophageal stricture)   Dilaudid [Hydromorphone Hcl] Itching    Nasal itching; resolution with Benadryl   Codeine Nausea And Vomiting and Other (See Comments)    hallucinations   Flonase [Fluticasone]     Mouth blisters   Ibandronate Sodium Other (See Comments)     bone pain    No orders of the defined types were placed in this encounter.   AUTHORIZATION INFORMATION Primary Insurance: 1D#: Group #:  Secondary Insurance: 1D#: Group #:  SCHEDULE INFORMATION: Date: 04/29/23 Time: Location: [facility]

## 2022-10-28 ENCOUNTER — Other Ambulatory Visit: Payer: Self-pay | Admitting: Internal Medicine

## 2022-12-04 ENCOUNTER — Ambulatory Visit
Admission: RE | Admit: 2022-12-04 | Discharge: 2022-12-04 | Disposition: A | Discharge status: Home / Self Care | Payer: Medicare HMO | Source: Ambulatory Visit | Attending: Internal Medicine | Admitting: Internal Medicine

## 2022-12-04 DIAGNOSIS — M810 Age-related osteoporosis without current pathological fracture: Secondary | ICD-10-CM | POA: Diagnosis not present

## 2022-12-24 ENCOUNTER — Encounter: Payer: Medicare HMO | Admitting: *Deleted

## 2022-12-27 ENCOUNTER — Ambulatory Visit (INDEPENDENT_AMBULATORY_CARE_PROVIDER_SITE_OTHER): Payer: Medicare HMO

## 2022-12-27 VITALS — BP 125/71 | HR 85 | Wt 128.2 lb

## 2022-12-27 DIAGNOSIS — Z0000 Encounter for general adult medical examination without abnormal findings: Secondary | ICD-10-CM

## 2022-12-27 NOTE — Progress Notes (Cosign Needed Addendum)
I connected with  Britta Mccreedy on 12/27/22 by a audio enabled telemedicine application and verified that I am speaking with the correct person using two identifiers.  Patient Location: Home  Provider Location: Home Office  I discussed the limitations of evaluation and management by telemedicine. The patient expressed understanding and agreed to proceed.   Subjective:   Crystal Kidd is a 77 y.o. female who presents for Medicare Annual (Subsequent) preventive examination.  Review of Systems    Per HPI unless specifically indicated below.  Cardiac Risk Factors include: advanced age (>63men, >19 women);female gender, Hypertension, and Hyperlipidemia.           Objective:       12/27/2022    9:11 AM 10/18/2022    8:15 AM 06/27/2022    1:25 PM  Vitals with BMI  Height  5\' 2"    Weight 128 lbs 3 oz 129 lbs 10 oz 124 lbs  BMI  23.7   Systolic 125 130 161  Diastolic 71 80 83  Pulse 85 77 94    Today's Vitals   12/27/22 0911  BP: 125/71  Pulse: 85  SpO2: 96%  Weight: 128 lb 3.2 oz (58.2 kg)   Body mass index is 23.45 kg/m.     12/27/2022    9:28 AM 12/19/2021   10:36 AM 12/14/2020    2:47 PM 12/14/2019    9:51 AM 10/19/2019    4:00 AM 10/17/2019    5:01 PM 10/01/2019    8:22 PM  Advanced Directives  Does Patient Have a Medical Advance Directive? Yes Yes Yes No Yes Yes No;Yes  Type of Estate agent of [place];Living will Healthcare Power of [place];Living will Healthcare Power of [place];Living will  Healthcare Power of [employer] Power of Attorney Living will;Healthcare Power of Attorney  Does patient want to make changes to medical advance directive? No - Patient declined No - Patient declined No - Patient declined Yes (MAU/Ambulatory/Procedural Areas - Information given) No - Patient declined    Copy of Healthcare Power of Attorney in Chart? No - copy requested No - copy requested No - copy requested  No - copy requested No - copy requested,  Physician notified No - copy requested  Would patient like information on creating a medical advance directive?     No - Patient declined No - Patient declined No - Patient declined    Current Medications (verified) Outpatient Encounter Medications as of 12/27/2022  Medication Sig   Alum Hydroxide-Mag Carbonate (GAVISCON PO) Take 1 capsule by mouth daily.   amLODipine (NORVASC) 5 MG tablet TAKE 1 TABLET EVERY DAY   carboxymethylcellul-glycerin (REFRESH OPTIVE) 0.5-0.9 % ophthalmic solution Place 1 drop into both eyes as needed for dry eyes.   cholecalciferol (VITAMIN D3) 25 MCG (1000 UNIT) tablet Take 1,000 Units by mouth daily.   esomeprazole (NEXIUM) 40 MG capsule TAKE 1 CAPSULE EVERY DAY BEFORE BREAKFAST   famotidine (PEPCID) 20 MG tablet TAKE 1 TABLET (20 MG TOTAL) BY MOUTH DAILY. PRIOR TO DINNER   gabapentin (NEURONTIN) 100 MG capsule TAKE 1 CAPSULE (100 MG TOTAL) BY MOUTH 3 (THREE) TIMES DAILY. EVERY 8 HOURS AS NEEDED   hydrochlorothiazide (HYDRODIURIL) 25 MG tablet Take 1 tablet (25 mg total) by mouth daily.   Inulin (FIBER CHOICE PO) Take 1 tablet by mouth daily as needed.   levothyroxine (SYNTHROID) 75 MCG tablet TAKE 1 TABLET EVERY DAY BEFORE BREAKFAST   ondansetron (ZOFRAN-ODT) 4 MG disintegrating tablet TAKE 1  TABLET BY MOUTH EVERY 8 HOURS AS NEEDED FOR NAUSEA AND VOMITING   Peppermint Oil (IBGARD PO) Take by mouth.   raloxifene (EVISTA) 60 MG tablet Take 1 tablet (60 mg total) by mouth daily.   simvastatin (ZOCOR) 20 MG tablet TAKE 1 TABLET EVERY EVENING   No facility-administered encounter medications on file as of 12/27/2022.    Allergies (verified) Besivance [besifloxacin hcl], Polymyxin b, Alendronate sodium, Dilaudid [hydromorphone hcl], Codeine, Flonase [fluticasone], and Ibandronate sodium   History: Past Medical History:  Diagnosis Date   Allergic rhinoconjunctivitis    Allergy 1992   Arthritis    Cataract    Diverticulosis    Eosinophilic esophagitis    GERD  (gastroesophageal reflux disease)    PMH of esophageal stricture   Hyperlipidemia    Hypertension    Osteopenia    BMD @[facility]    Thyroid disease    hypothyroidism   Transfusion history 1968   post partum   Trapezius muscle spasm 04/13/2015   Vitiligo    Past Surgical History:  Procedure Laterality Date   ABDOMINAL HYSTERECTOMY     for dysfunctional menses; USO with TAH   APPENDECTOMY     BREAST EXCISIONAL BIOPSY Right 1972   NEG   BREAST EXCISIONAL BIOPSY Left 1980'S   NEG   BREAST SURGERY     biopsy X 2   CHOLECYSTECTOMY N/A 04/30/2016   Procedure: LAPAROSCOPIC CHOLECYSTECTOMY;  Surgeon: Violeta Gelinas, MD;  Location: [facility] OR;  Service: General;  Laterality: N/A;   COLONOSCOPY     X2; Tics; Dr Russella Dar, GI   ESOPHAGEAL DILATION     X2   ESOPHAGOGASTRODUODENOSCOPY (EGD) WITH PROPOFOL N/A 10/19/2019   Procedure: ESOPHAGOGASTRODUODENOSCOPY (EGD) WITH PROPOFOL;  Surgeon: Napoleon Form, MD;  Location: [facility] ENDOSCOPY;  Service: Endoscopy;  Laterality: N/A;   EYE SURGERY     cataracts removed   FRACTURE SURGERY  2003or 2004   ROTATOR CUFF REPAIR     R shoulder   TUBAL LIGATION     befor hysterectomy   UPPER GASTROINTESTINAL ENDOSCOPY     Family History  Problem Relation Age of Onset   Stomach cancer Mother    Breast cancer Mother    Emphysema Father    Breast cancer Sister        mammograms @ [facility]   Colon polyps Sister    Coronary artery disease Brother    COPD Brother    Colon polyps Brother    Liver disease Brother    Colon cancer Maternal Aunt    Cancer Maternal Aunt 17       colon cA   Diabetes Neg Hx    Stroke Neg Hx    Heart disease Neg Hx    Esophageal cancer Neg Hx    Rectal cancer Neg Hx    Social History   Socioeconomic History   Marital status: Widowed    Spouse name: Not on file   Number of children: 2   Years of education: Not on file   Highest education level: Not on file  Occupational History   Occupation: Retired  Tobacco Use   Smoking  status: Never   Smokeless tobacco: Never  Vaping Use   Vaping Use: Never used  Substance and Sexual Activity   Alcohol use: Yes    Comment: have wine 1 glass every 6 months   Drug use: No   Sexual activity: Not Currently  Other Topics Concern   Not on file  Social History Narrative  Not on file   Social Determinants of Health   Financial Resource Strain: Low Risk  (12/27/2022)   Overall Financial Resource Strain (CARDIA)    Difficulty of Paying Living Expenses: Not hard at all  Food Insecurity: No Food Insecurity (12/27/2022)   Hunger Vital Sign    Worried About Running Out of Food in the Last Year: Never true    Ran Out of Food in the Last Year: Never true  Transportation Needs: No Transportation Needs (12/27/2022)   PRAPARE - Administrator, Civil Service (Medical): No    Lack of Transportation (Non-Medical): No  Physical Activity: Sufficiently Active (12/27/2022)   Exercise Vital Sign    Days of Exercise per Week: 5 days    Minutes of Exercise per Session: 30 min  Stress: No Stress Concern Present (12/27/2022)   [employer] of Occupational Health - Occupational Stress Questionnaire    Feeling of Stress : Not at all  Social Connections: Moderately Integrated (12/27/2022)   Social Connection and Isolation Panel [NHANES]    Frequency of Communication with Friends and Family: More than three times a week    Frequency of Social Gatherings with Friends and Family: More than three times a week    Attends Religious Services: More than 4 times per year    Active Member of [employer] or Organizations: Yes    Attends Banker Meetings: Never    Marital Status: Widowed    Tobacco Counseling Counseling given: No   Clinical Intake:  Pre-visit preparation completed: No  Pain : No/denies pain     Nutritional Status: BMI of 19-24  Normal Nutritional Risks: Nausea/ vomitting/ diarrhea Diabetes: No  How often do you need to have someone help you when you  read instructions, pamphlets, or other written materials from your doctor or pharmacy?: 1 - Never  Diabetic?No Interpreter Needed?: No  Information entered by :: Laurel Dimmer, CMA   Activities of Daily Living    12/27/2022    9:17 AM  In your present state of health, do you have any difficulty performing the following activities:  Hearing? 1  Vision? 0  Comment 07/2022, Dr. Hazle Quant  Difficulty concentrating or making decisions? 0  Walking or climbing stairs? 0  Dressing or bathing? 0  Doing errands, shopping? 0    Patient Care Team: Sherlene Shams, MD as PCP - General (Internal Medicine)  Indicate any recent Medical Services you may have received from other than [facility] providers in the past year (date may be approximate).     Assessment:   This is a routine wellness examination for [place].   Hearing/Vision screen Hearing loss, bilateral. Wear hearing aids Denies any change to her vision. Annual Eye Exam Dr. Hazle Quant .  Dietary issues and exercise activities discussed: Current Exercise Habits: Home exercise routine, Type of exercise: Other - see comments;walking (gardening, yard work), Time (Minutes): 30, Frequency (Times/Week): 5, Weekly Exercise (Minutes/Week): 150, Intensity: Moderate, Exercise limited by: None identified   Goals Addressed   None    Depression Screen    12/27/2022    9:16 AM 10/18/2022    8:17 AM 04/17/2022    3:25 PM 03/15/2022    8:21 AM 02/13/2022    4:21 PM 12/19/2021   10:33 AM 10/15/2021    2:18 PM  PHQ 2/9 Scores  PHQ - 2 Score 0 0 0 0 0 0 0    Fall Risk    12/27/2022    9:17 AM 10/18/2022  8:17 AM 04/17/2022    3:24 PM 03/15/2022    8:21 AM 02/13/2022    4:21 PM  Fall Risk   Falls in the past year? 0 0 0 0 0  Number falls in past yr: 0 0  0   Injury with Fall? 0 0  0   Risk for fall due to : No Fall Risks No Fall Risks No Fall Risks No Fall Risks No Fall Risks  Follow up Falls evaluation completed Falls evaluation completed Falls evaluation  completed Falls evaluation completed Falls evaluation completed    FALL RISK PREVENTION PERTAINING TO THE HOME:  Any stairs in or around the home? No  If so, are there any without handrails? No  Home free of loose throw rugs in walkways, pet beds, electrical cords, etc? No Adequate lighting in your home to reduce risk of falls? Yes   ASSISTIVE DEVICES UTILIZED TO PREVENT FALLS:  Life alert? No  Use of a cane, walker or w/c? No  Grab bars in the bathroom? Yes  Shower chair or bench in shower? Yes  Elevated toilet seat or a handicapped toilet? Yes   TIMED UP AND GO:  Was the test performed?Unable to perform, virtual appointment   Cognitive Function:    12/14/2019    9:57 AM 12/01/2017   12:09 PM 11/29/2016   11:24 AM  MMSE - Mini Mental State Exam  Not completed: Unable to complete    Orientation to time  5 5  Orientation to Place  5 5  Registration  3 3  Attention/ Calculation  5 5  Recall  3 3  Language- name 2 objects  2 2  Language- repeat  1 1  Language- follow 3 step command  3 3  Language- read & follow direction  1 1  Write a sentence  1 1  Copy design  1 1  Total score  30 30        12/27/2022    9:25 AM 12/14/2020    3:08 PM 12/09/2018    1:43 PM  6CIT Screen  What Year? 0 points 0 points 0 points  What month? 0 points 0 points 0 points  What time? 0 points 0 points 0 points  Count back from 20 0 points  0 points  Months in reverse 0 points  0 points  Repeat phrase 0 points  0 points  Total Score 0 points  0 points    Immunizations Immunization History  Administered Date(s) Administered   COVID-19, mRNA, vaccine(Comirnaty)12 years and older 07/15/2022   Fluad Quad(high Dose 65+) 04/26/2019, 06/02/2020, 05/16/2021, 05/21/2022   H1N1 08/31/2008   Influenza Split 05/22/2011, 04/29/2012   Influenza Whole 08/12/2002, 05/05/2008, 05/16/2009, 05/21/2010   Influenza, High Dose Seasonal PF 04/12/2013, 04/09/2016, 04/10/2017, 05/25/2018, 04/19/2022    Influenza,inj,Quad PF,6+ Mos 04/20/2014, 05/02/2015   PFIZER(Purple Top)SARS-COV-2 Vaccination 09/01/2019, 09/20/2019, 06/23/2020   Pneumococcal Conjugate-13 06/13/2014   Pneumococcal Polysaccharide-23 06/21/2010, 11/30/2015   Td 04/13/2004   Tdap 05/11/2014   Zoster Recombinat (Shingrix) 06/30/2019, 12/28/2019   Zoster, Live 07/26/2009    TDAP status: Up to date  Flu Vaccine status: Up to date  Pneumococcal vaccine status: Up to date  Covid-19 vaccine status: Completed vaccines  Qualifies for Shingles Vaccine? Yes   Zostavax completed Yes   Shingrix Completed?: Yes  Screening Tests Health Maintenance  Topic Date Due   MAMMOGRAM  01/22/2023   INFLUENZA VACCINE  03/13/2023   COLONOSCOPY (Pts 45-32yrs Insurance coverage will need to be  confirmed)  04/28/2023   Medicare Annual Wellness (AWV)  12/27/2023   DTaP/Tdap/Td (3 - Td or Tdap) 05/11/2024   Pneumonia Vaccine 22+ Years old  Completed   DEXA SCAN  Completed   COVID-19 Vaccine  Completed   Hepatitis C Screening  Completed   Zoster Vaccines- Shingrix  Completed   HPV VACCINES  Aged Out    Health Maintenance  There are no preventive care reminders to display for this patient.   Colorectal cancer screening: Type of screening: Colonoscopy. Completed 04/27/2020. Repeat every 3 years  Mammogram status: Completed 01/21/2022. Repeat every year  DEXA Scan:  Lung Cancer Screening: (Low Dose CT Chest recommended if Age 28-80 years, 30 pack-year currently smoking OR have quit w/in 15years.) does not qualify.   Lung Cancer Screening Referral: not appliable  Additional Screening:  Hepatitis C Screening: does qualify; Completed 09/02/2013  Vision Screening: Recommended annual ophthalmology exams for early detection of glaucoma and other disorders of the eye. Is the patient up to date with their annual eye exam?  Yes  Who is the provider or what is the name of the office in which the patient attends annual eye exams? Dr.  Hazle Quant  If pt is not established with a provider, would they like to be referred to a provider to establish care? No .   Dental Screening: Recommended annual dental exams for proper oral hygiene  Community Resource Referral / Chronic Care Management: CRR required this visit?  No   CCM required this visit?  No      Plan:     I have personally reviewed and noted the following in the patient's chart:   Medical and social history Use of alcohol, tobacco or illicit drugs  Current medications and supplements including opioid prescriptions. Patient is not currently taking opioid prescriptions. Functional ability and status Nutritional status Physical activity Advanced directives List of other physicians Hospitalizations, surgeries, and ER visits in previous 12 months Vitals Screenings to include cognitive, depression, and falls Referrals and appointments  In addition, I have reviewed and discussed with patient certain preventive protocols, quality metrics, and best practice recommendations. A written personalized care plan for preventive services as well as general preventive health recommendations were provided to patient.    Ms. Elmer , Thank you for taking time to come for your Medicare Wellness Visit. I appreciate your ongoing commitment to your health goals. Please review the following plan we discussed and let me know if I can assist you in the future.   These are the goals we discussed:  Goals       Weight goal 120lb (pt-stated)      Healthy diet Stay active        This is a list of the screening recommended for you and due dates:  Health Maintenance  Topic Date Due   Mammogram  01/22/2023   Flu Shot  03/13/2023   Colon Cancer Screening  04/28/2023   Medicare Annual Wellness Visit  12/27/2023   DTaP/Tdap/Td vaccine (3 - Td or Tdap) 05/11/2024   Pneumonia Vaccine  Completed   DEXA scan (bone density measurement)  Completed   COVID-19 Vaccine  Completed    Hepatitis C Screening: USPSTF Recommendation to screen - Ages 36-79 yo.  Completed   Zoster (Shingles) Vaccine  Completed   HPV Vaccine  Aged [address] [place]   12/27/2022   Nurse Notes: Approximately 30 minute Non-Face -To-Face Medicare Wellness Visit  I have reviewed the above information and agree with above.   Duncan Dull, MD

## 2022-12-27 NOTE — Patient Instructions (Signed)

## 2023-01-23 ENCOUNTER — Ambulatory Visit
Admission: RE | Admit: 2023-01-23 | Discharge: 2023-01-23 | Disposition: A | Discharge status: Home / Self Care | Payer: Medicare HMO | Source: Ambulatory Visit | Attending: Internal Medicine | Admitting: Internal Medicine

## 2023-01-23 DIAGNOSIS — Z1231 Encounter for screening mammogram for malignant neoplasm of breast: Secondary | ICD-10-CM | POA: Insufficient documentation

## 2023-02-10 ENCOUNTER — Other Ambulatory Visit: Payer: Self-pay | Admitting: Internal Medicine

## 2023-03-20 ENCOUNTER — Telehealth: Payer: Self-pay

## 2023-03-20 NOTE — Patient Outreach (Signed)
  Care Coordination   Initial Visit Note   03/20/2023 Name: SHNIYA ANCEL MRN: [id number] DOB: 1946/04/06  Britta Mccreedy is a 77 y.o. year old female who sees Darrick Huntsman, Mar Daring, MD for primary care. I spoke with  Britta Mccreedy by phone today.  What matters to the patients health and wellness today?  Patient states she lives alone and is independent.  She states she remains active with exercise and gardening.  Patient states her blood pressure has been stable.  Patient denies any nursing and/ or community resource needs.     Goals Addressed             This Visit's Progress    care coordination activities - no follow up needed.       Interventions Today    Flowsheet Row Most Recent Value  General Interventions   General Interventions Discussed/Reviewed Doctor Visits  [Care coordination services discussed. SDOH survey completed. AWV discussed and patient advised to contact provider office to schedule. Discussed vaccines. Advised to contact PCP office if care coordination services needed in the future.]  Doctor Visits Discussed/Reviewed Doctor Visits Discussed  [reviewed upcoming provider visits. Advised to keep follow up appointments with providers.]  Exercise Interventions   Exercise Discussed/Reviewed Exercise Discussed  [Assessed patients exercise/ activity level]              SDOH assessments and interventions completed:  Yes  SDOH Interventions Today    Flowsheet Row Most Recent Value  SDOH Interventions   Food Insecurity Interventions Intervention Not Indicated  Housing Interventions Intervention Not Indicated  Transportation Interventions Intervention Not Indicated        Care Coordination Interventions:  Yes, provided   Follow up plan: No further intervention required.   Encounter Outcome:  Pt. Visit Completed   George Ina RN,BSN,CCM [facility] Care Coordination [phone] direct line

## 2023-03-27 ENCOUNTER — Encounter (INDEPENDENT_AMBULATORY_CARE_PROVIDER_SITE_OTHER): Payer: Self-pay

## 2023-04-07 DIAGNOSIS — R09A2 Foreign body sensation, throat: Secondary | ICD-10-CM | POA: Diagnosis not present

## 2023-04-07 DIAGNOSIS — K219 Gastro-esophageal reflux disease without esophagitis: Secondary | ICD-10-CM | POA: Diagnosis not present

## 2023-04-08 ENCOUNTER — Telehealth: Payer: Self-pay | Admitting: Family

## 2023-04-16 ENCOUNTER — Telehealth: Payer: Self-pay

## 2023-04-16 ENCOUNTER — Telehealth: Payer: Self-pay | Admitting: Gastroenterology

## 2023-04-16 MED ORDER — NA SULFATE-K SULFATE-MG SULF 17.5-3.13-1.6 GM/177ML PO SOLN: 1.0000 | Freq: Once | ORAL | 0 refills | Status: AC

## 2023-04-16 NOTE — Telephone Encounter (Signed)
Voice message has been left for patient to make her aware that her prescription for Suprep has been sent to [facility] on [address]  Informed her that Dr. Annabell Sabal CMA has been made aware of her having issues with reflux and she will contact patient to advise.  Thanks, [place], [place]

## 2023-04-16 NOTE — Telephone Encounter (Signed)
Prescription Request  04/16/2023  LOV: Visit date not found  What is the name of the medication or equipment? famotidine (PEPCID) 20 MG tablet  Have you contacted your pharmacy to request a refill? Yes   Which pharmacy would you like this sent to?   [id number] [address] [place] [address] Phone: [phone] Fax: [phone]   Patient notified that their request is being sent to the clinical staff for review and that they should receive a response within 2 business days.   Please advise at Mobile [phone] (mobile)

## 2023-04-16 NOTE — Telephone Encounter (Signed)
Medication refilled, mychart message sent

## 2023-04-16 NOTE — Telephone Encounter (Signed)
Patient called in wanting to know if her prep was called in. Patient stated that she is also having problem with her acid reflux. She will be home until 11 am if you call after that call her cell phone.

## 2023-04-17 NOTE — Telephone Encounter (Signed)
F/u appt scheduled

## 2023-04-18 ENCOUNTER — Telehealth: Payer: Self-pay | Admitting: *Deleted

## 2023-04-18 NOTE — Telephone Encounter (Signed)
Received a call from Tammy in billing regarding two charges for flu vaccines in 2023. Pt received a bill for 2 flu vaccines.   I was able to verify that she in fact received her Flu vaccine on 05/21/2022 at our flu clinic in office by Kristeen Mans, CMA.   Pt did not receive vaccine on 04/19/2022 by Rockney Ghee, CMA. This must have been an error on her end as the above CMA was not here on this date.  Per 04/19/22 office note Dr Darrick Huntsman recommended that patient "Postpone your flu vaccine until mid October"   I have deleted the documentation from 04/19/2022 & billing is aware. They will call insurance company to report and have them remove this on their end as well.

## 2023-04-21 ENCOUNTER — Encounter: Payer: Self-pay | Admitting: Internal Medicine

## 2023-04-21 ENCOUNTER — Ambulatory Visit (INDEPENDENT_AMBULATORY_CARE_PROVIDER_SITE_OTHER): Payer: Medicare HMO | Admitting: Internal Medicine

## 2023-04-21 VITALS — BP 128/78 | HR 75 | Temp 97.9°F | Ht 62.0 in | Wt 130.0 lb

## 2023-04-21 DIAGNOSIS — E034 Atrophy of thyroid (acquired): Secondary | ICD-10-CM | POA: Diagnosis not present

## 2023-04-21 DIAGNOSIS — E785 Hyperlipidemia, unspecified: Secondary | ICD-10-CM | POA: Diagnosis not present

## 2023-04-21 DIAGNOSIS — I700 Atherosclerosis of aorta: Secondary | ICD-10-CM | POA: Diagnosis not present

## 2023-04-21 DIAGNOSIS — I10 Essential (primary) hypertension: Secondary | ICD-10-CM | POA: Diagnosis not present

## 2023-04-21 DIAGNOSIS — E559 Vitamin D deficiency, unspecified: Secondary | ICD-10-CM

## 2023-04-21 DIAGNOSIS — M810 Age-related osteoporosis without current pathological fracture: Secondary | ICD-10-CM | POA: Diagnosis not present

## 2023-04-21 DIAGNOSIS — R053 Chronic cough: Secondary | ICD-10-CM

## 2023-04-21 DIAGNOSIS — R42 Dizziness and giddiness: Secondary | ICD-10-CM

## 2023-04-21 HISTORY — DX: Dizziness and giddiness: R42

## 2023-04-21 LAB — COMPREHENSIVE METABOLIC PANEL
ALT: 14 U/L (ref 0–35)
AST: 21 U/L (ref 0–37)
Albumin: 3.9 g/dL (ref 3.5–5.2)
Alkaline Phosphatase: 99 U/L (ref 39–117)
BUN: 11 mg/dL (ref 6–23)
CO2: 31 meq/L (ref 19–32)
Calcium: 9.6 mg/dL (ref 8.4–10.5)
Chloride: 104 meq/L (ref 96–112)
Creatinine, Ser: 0.76 mg/dL (ref 0.40–1.20)
GFR: 75.36 mL/min (ref >60.00)
Glucose, Bld: 90 mg/dL (ref 70–99)
Potassium: 4.2 meq/L (ref 3.5–5.1)
Sodium: 140 meq/L (ref 135–145)
Total Bilirubin: 0.7 mg/dL (ref 0.2–1.2)
Total Protein: 7 g/dL (ref 6.0–8.3)

## 2023-04-21 LAB — CBC WITH DIFFERENTIAL/PLATELET
Basophils Absolute: 0.1 10*3/uL (ref 0.0–0.1)
Basophils Relative: 1.7 % (ref 0.0–3.0)
Eosinophils Absolute: 0.4 10*3/uL (ref 0.0–0.7)
Eosinophils Relative: 7.5 % — ABNORMAL HIGH (ref 0.0–5.0)
HCT: 42.1 % (ref 36.0–46.0)
Hemoglobin: 13.8 g/dL (ref 12.0–15.0)
Lymphocytes Relative: 23.9 % (ref 12.0–46.0)
Lymphs Abs: 1.1 10*3/uL (ref 0.7–4.0)
MCHC: 32.7 g/dL (ref 30.0–36.0)
MCV: 87.9 fl (ref 78.0–100.0)
Monocytes Absolute: 0.6 10*3/uL (ref 0.1–1.0)
Monocytes Relative: 13.1 % — ABNORMAL HIGH (ref 3.0–12.0)
Neutro Abs: 2.6 10*3/uL (ref 1.4–7.7)
Neutrophils Relative %: 53.8 % (ref 43.0–77.0)
Platelets: 266 10*3/uL (ref 150.0–400.0)
RBC: 4.79 Mil/uL (ref 3.87–5.11)
RDW: 14.1 % (ref 11.5–15.5)
WBC: 4.8 10*3/uL (ref 4.0–10.5)

## 2023-04-21 LAB — TSH: TSH: 2.18 u[IU]/mL (ref 0.35–5.50)

## 2023-04-21 LAB — VITAMIN D 25 HYDROXY (VIT D DEFICIENCY, FRACTURES): VITD: 65.77 ng/mL (ref 30.00–100.00)

## 2023-04-21 MED ORDER — RALOXIFENE HCL 60 MG PO TABS: 60.0000 mg | ORAL_TABLET | Freq: Every day | ORAL | 1 refills | Status: DC

## 2023-04-21 NOTE — Progress Notes (Signed)
Subjective:  Patient ID: Crystal Kidd, female    DOB: 1946/06/05  Age: 77 y.o. MRN: [id number]  CC: The primary encounter diagnosis was Dizziness. Diagnoses of Vitamin D deficiency, Primary hypertension, Hypothyroidism due to acquired atrophy of thyroid, Hyperlipidemia LDL goal <100, Thoracic aortic atherosclerosis (HCC), Elevated blood pressure reading in office with white coat syndrome, with diagnosis of hypertension, Age-related osteoporosis without current pathological fracture, and Chronic cough were also pertinent to this visit.   HPI BLAYRE BUCHKO presents for  Chief Complaint  Patient presents with   Medical Management of Chronic Issues    C/o lingering cough    77 yr old female with h/o esophageal dysphagia , hypertension  hypothyroidism  and osteoporosis presents for follow up  1) chronic cough: present since July; Had COVID in January,  used a cough suppressant,  so when cough returned she tried using suppressants which did not help.  Saw ENT:  dx'd with GERD .  Has appt with [facility] tomorrow,  taking Nexium  in the morning and famotidine in the mid afternoon. . Using a wedge pillow .   2) Osteoporisis: taking D3,  calcium and stopped  Evista due to recurrent dizziness  3) history of abnormal weight loss:  resolved,  still drinking and occasional protein shake, but most of weight gain is due to return of appetite   Outpatient Medications Prior to Visit  Medication Sig Dispense Refill   Alum Hydroxide-Mag Carbonate (GAVISCON PO) Take 1 capsule by mouth daily.     amLODipine (NORVASC) 5 MG tablet TAKE 1 TABLET EVERY DAY 90 tablet 3   Calcium-Phosphorus-Vitamin D (CALCIUM GUMMIES PO) Take by mouth.     carboxymethylcellul-glycerin (REFRESH OPTIVE) 0.5-0.9 % ophthalmic solution Place 1 drop into both eyes as needed for dry eyes.     cholecalciferol (VITAMIN D3) 25 MCG (1000 UNIT) tablet Take 1,000 Units by mouth daily.     esomeprazole (NEXIUM) 40 MG capsule TAKE 1 CAPSULE EVERY  DAY BEFORE BREAKFAST 90 capsule 3   famotidine (PEPCID) 20 MG tablet TAKE 1 TABLET (20 MG TOTAL) BY MOUTH DAILY. PRIOR TO DINNER 90 tablet 1   gabapentin (NEURONTIN) 100 MG capsule TAKE 1 CAPSULE (100 MG TOTAL) BY MOUTH 3 (THREE) TIMES DAILY. EVERY 8 HOURS AS NEEDED 90 capsule 1   hydrochlorothiazide (HYDRODIURIL) 25 MG tablet Take 1 tablet (25 mg total) by mouth daily. 90 tablet 1   levothyroxine (SYNTHROID) 75 MCG tablet TAKE 1 TABLET EVERY DAY BEFORE BREAKFAST 90 tablet 3   ondansetron (ZOFRAN-ODT) 4 MG disintegrating tablet TAKE 1 TABLET BY MOUTH EVERY 8 HOURS AS NEEDED FOR NAUSEA AND VOMITING 20 tablet 3   Peppermint Oil (IBGARD PO) Take by mouth.     simvastatin (ZOCOR) 20 MG tablet TAKE 1 TABLET EVERY EVENING 90 tablet 3   Inulin (FIBER CHOICE PO) Take 1 tablet by mouth daily as needed.     raloxifene (EVISTA) 60 MG tablet Take 1 tablet (60 mg total) by mouth daily. (Patient not taking: Reported on 04/21/2023) 90 tablet 1   No facility-administered medications prior to visit.    Review of Systems;  Patient denies headache, fevers, malaise, unintentional weight loss, skin rash, eye pain, sinus congestion and sinus pain, sore throat, dysphagia,  hemoptysis , cough, dyspnea, wheezing, chest pain, palpitations, orthopnea, edema, abdominal pain, nausea, melena, diarrhea, constipation, flank pain, dysuria, hematuria, urinary  Frequency, nocturia, numbness, tingling, seizures,  Focal weakness, Loss of consciousness,  Tremor, insomnia, depression, anxiety, and suicidal ideation.  Objective:  BP 128/78   Pulse 75   Temp 97.9 F (36.6 C) (Oral)   Ht 5\' 2"  (1.575 m)   Wt 130 lb (59 kg)   SpO2 98%   BMI 23.78 kg/m   BP Readings from Last 3 Encounters:  04/21/23 128/78  12/27/22 125/71  10/18/22 130/80    Wt Readings from Last 3 Encounters:  04/21/23 130 lb (59 kg)  12/27/22 128 lb 3.2 oz (58.2 kg)  10/18/22 129 lb 9.6 oz (58.8 kg)    Physical Exam Vitals reviewed.   Constitutional:      General: She is not in acute distress.    Appearance: Normal appearance. She is normal weight. She is not ill-appearing, toxic-appearing or diaphoretic.  HENT:     Head: Normocephalic.  Eyes:     General: No scleral icterus.       Right eye: No discharge.        Left eye: No discharge.     Conjunctiva/sclera: Conjunctivae normal.  Cardiovascular:     Rate and Rhythm: Normal rate and regular rhythm.     Heart sounds: Normal heart sounds.  Pulmonary:     Effort: Pulmonary effort is normal. No respiratory distress.     Breath sounds: Normal breath sounds.  Musculoskeletal:        General: Normal range of motion.  Skin:    General: Skin is warm and dry.  Neurological:     General: No focal deficit present.     Mental Status: She is alert and oriented to person, place, and time. Mental status is at baseline.  Psychiatric:        Mood and Affect: Mood normal.        Behavior: Behavior normal.        Thought Content: Thought content normal.        Judgment: Judgment normal.    Lab Results  Component Value Date   HGBA1C 5.4 02/08/2021   HGBA1C 5.5 06/20/2017   HGBA1C 5.6 08/25/2012    Lab Results  Component Value Date   CREATININE 0.76 04/21/2023   CREATININE 0.77 10/18/2022   CREATININE 0.71 03/15/2022    Lab Results  Component Value Date   WBC 4.8 04/21/2023   HGB 13.8 04/21/2023   HCT 42.1 04/21/2023   PLT 266.0 04/21/2023   GLUCOSE 90 04/21/2023   CHOL 132 10/18/2022   TRIG 81.0 10/18/2022   HDL 57.90 10/18/2022   LDLDIRECT 66.0 10/18/2022   LDLCALC 58 10/18/2022   ALT 14 04/21/2023   AST 21 04/21/2023   NA 140 04/21/2023   K 4.2 04/21/2023   CL 104 04/21/2023   CREATININE 0.76 04/21/2023   BUN 11 04/21/2023   CO2 31 04/21/2023   TSH 2.18 04/21/2023   HGBA1C 5.4 02/08/2021    MM 3D SCREENING MAMMOGRAM BILATERAL BREAST  Result Date: 01/24/2023 CLINICAL DATA:  Screening. EXAM: DIGITAL SCREENING BILATERAL MAMMOGRAM WITH  TOMOSYNTHESIS AND CAD TECHNIQUE: Bilateral screening digital craniocaudal and mediolateral oblique mammograms were obtained. Bilateral screening digital breast tomosynthesis was performed. The images were evaluated with computer-aided detection. COMPARISON:  Previous exam(s). ACR Breast Density Category c: The breasts are heterogeneously dense, which may obscure small masses. FINDINGS: There are no findings suspicious for malignancy. IMPRESSION: No mammographic evidence of malignancy. A result letter of this screening mammogram will be mailed directly to the patient. RECOMMENDATION: Screening mammogram in one year. (Code:[id number]-B-01Y) BI-RADS CATEGORY  1: Negative. Electronically Signed   By: Frederico Hamman M.D.  On: 01/24/2023 15:40    Assessment & Plan:  .Dizziness Assessment & Plan: Reportedly occurring with Evista trial ,  without palpitations.    Lab Results  Component Value Date   TSH 2.18 04/21/2023   Lab Results  Component Value Date   WBC 4.8 04/21/2023   HGB 13.8 04/21/2023   HCT 42.1 04/21/2023   MCV 87.9 04/21/2023   PLT 266.0 04/21/2023   Lab Results  Component Value Date   NA 140 04/21/2023   K 4.2 04/21/2023   CL 104 04/21/2023   CO2 31 04/21/2023     Orders: -     CBC with Differential/Platelet  Vitamin D deficiency -     VITAMIN D 25 Hydroxy (Vit-D Deficiency, Fractures)  Primary hypertension -     Comprehensive metabolic panel  Hypothyroidism due to acquired atrophy of thyroid -     TSH  Hyperlipidemia LDL goal <100 -     Lipid Panel w/reflex Direct LDL  Thoracic aortic atherosclerosis (HCC) Assessment & Plan: Managed with statin.  She  has no side effects and liver enzymes are normal. No changes today.  Lab Results  Component Value Date   CHOL 132 10/18/2022   HDL 57.90 10/18/2022   LDLCALC 58 10/18/2022   LDLDIRECT 66.0 10/18/2022   TRIG 81.0 10/18/2022   CHOLHDL 2 10/18/2022   Lab Results  Component Value Date   ALT 14 04/21/2023   AST  21 04/21/2023   ALKPHOS 99 04/21/2023   BILITOT 0.7 04/21/2023      Elevated blood pressure reading in office with white coat syndrome, with diagnosis of hypertension Assessment & Plan: Well controlled on current regimen based oh home readings on a validated machine. . Renal function stable, no changes today.   Age-related osteoporosis without current pathological fracture Assessment & Plan: Encouraged to resume Evista    Chronic cough Assessment & Plan: Attributed to GERD by ENT.  Continue nexium and famotidine , wedge pillow    Other orders -     Raloxifene HCl; Take 1 tablet (60 mg total) by mouth daily.  Dispense: 90 tablet; Refill: 1     I provided 30 minutes of face-to-face time during this encounter reviewing patient's last visit with me, patient's  most recent visit with ENT, gastroenterology, previous  surgical and non surgical procedures, previous  labs and imaging studies, counseling on currently addressed issues,  and post visit ordering to diagnostics and therapeutics .   Follow-up: Return in about 6 months (around 10/19/2023).   Sherlene Shams, MD

## 2023-04-21 NOTE — Assessment & Plan Note (Signed)
Reportedly occurring with Evista trial ,  without palpitations.    Lab Results  Component Value Date   TSH 2.18 04/21/2023   Lab Results  Component Value Date   WBC 4.8 04/21/2023   HGB 13.8 04/21/2023   HCT 42.1 04/21/2023   MCV 87.9 04/21/2023   PLT 266.0 04/21/2023   Lab Results  Component Value Date   NA 140 04/21/2023   K 4.2 04/21/2023   CL 104 04/21/2023   CO2 31 04/21/2023

## 2023-04-21 NOTE — Assessment & Plan Note (Signed)
Managed with statin.  She  has no side effects and liver enzymes are normal. No changes today.  Lab Results  Component Value Date   CHOL 132 10/18/2022   HDL 57.90 10/18/2022   LDLCALC 58 10/18/2022   LDLDIRECT 66.0 10/18/2022   TRIG 81.0 10/18/2022   CHOLHDL 2 10/18/2022   Lab Results  Component Value Date   ALT 14 04/21/2023   AST 21 04/21/2023   ALKPHOS 99 04/21/2023   BILITOT 0.7 04/21/2023

## 2023-04-21 NOTE — Patient Instructions (Addendum)
Try taking Evista with your cholesterol medication   If you keep having dizzy symptoms,  stop the medication and we will apply for Prolia   I recommend Dr.  Galen Manila and Dr [place]. [place]

## 2023-04-21 NOTE — Assessment & Plan Note (Signed)
Attributed to GERD by ENT.  Continue nexium and famotidine , wedge pillow

## 2023-04-21 NOTE — Assessment & Plan Note (Signed)
Well controlled on current regimen based oh home readings on a validated machine. . Renal function stable, no changes today.

## 2023-04-21 NOTE — Assessment & Plan Note (Signed)
Encouraged to resume Evista

## 2023-04-22 ENCOUNTER — Encounter: Payer: Self-pay | Admitting: Gastroenterology

## 2023-04-22 ENCOUNTER — Ambulatory Visit: Payer: Medicare HMO | Admitting: Gastroenterology

## 2023-04-22 VITALS — BP 174/77 | HR 85 | Temp 97.9°F | Wt 129.0 lb

## 2023-04-22 DIAGNOSIS — K219 Gastro-esophageal reflux disease without esophagitis: Secondary | ICD-10-CM | POA: Diagnosis not present

## 2023-04-22 DIAGNOSIS — R059 Cough, unspecified: Secondary | ICD-10-CM

## 2023-04-22 LAB — LIPID PANEL W/REFLEX DIRECT LDL
Cholesterol: 145 mg/dL (ref <200)
HDL: 67 mg/dL (ref >=50)
LDL Cholesterol (Calc): 63 mg/dL
Non-HDL Cholesterol (Calc): 78 mg/dL (ref <130)
Total CHOL/HDL Ratio: 2.2 (calc) (ref <5.0)
Triglycerides: 66 mg/dL (ref <150)

## 2023-04-22 NOTE — Progress Notes (Signed)
Primary Care Physician: Sherlene Shams, MD  Primary Gastroenterologist:  Dr. Midge Minium  Chief Complaint  Patient presents with   Gastroesophageal Reflux    HPI: Crystal Kidd is a 77 y.o. female here after seeing ENT for possible LPR.  The patient reports that she has had a cough that is productive with some phlegm.  The patient states that she is not short of breath.  The patient is concerned because her symptoms seem to have been better when she took Benadryl and worse when she goes outside and does things on the lawn such as planting flowers or doing yard work.  She has not noticed any difference on her PPI and brought a wedge for her bed to avoid reflux but states this also did not help her.  There is no report of any unexplained weight loss fevers chills nausea or vomiting.  The patient also denies any dysphagia at this time.   Past Medical History:  Diagnosis Date   Allergic rhinoconjunctivitis    Allergy 1992   Arthritis    Cataract    Diverticulosis    Eosinophilic esophagitis    GERD (gastroesophageal reflux disease)    PMH of esophageal stricture   Hyperlipidemia    Hypertension    Osteopenia    [facility]    Thyroid disease    hypothyroidism   Transfusion history 1968   post partum   Trapezius muscle spasm 04/13/2015   Vitiligo     Current Outpatient Medications  Medication Sig Dispense Refill   Alum Hydroxide-Mag Carbonate (GAVISCON PO) Take 1 capsule by mouth daily.     amLODipine (NORVASC) 5 MG tablet TAKE 1 TABLET EVERY DAY 90 tablet 3   Calcium-Phosphorus-Vitamin D (CALCIUM GUMMIES PO) Take by mouth.     carboxymethylcellul-glycerin (REFRESH OPTIVE) 0.5-0.9 % ophthalmic solution Place 1 drop into both eyes as needed for dry eyes.     cholecalciferol (VITAMIN D3) 25 MCG (1000 UNIT) tablet Take 1,000 Units by mouth daily.     esomeprazole (NEXIUM) 40 MG capsule TAKE 1 CAPSULE EVERY DAY BEFORE BREAKFAST 90 capsule 3   famotidine (PEPCID) 20 MG tablet TAKE  1 TABLET (20 MG TOTAL) BY MOUTH DAILY. PRIOR TO DINNER 90 tablet 1   gabapentin (NEURONTIN) 100 MG capsule TAKE 1 CAPSULE (100 MG TOTAL) BY MOUTH 3 (THREE) TIMES DAILY. EVERY 8 HOURS AS NEEDED 90 capsule 1   hydrochlorothiazide (HYDRODIURIL) 25 MG tablet Take 1 tablet (25 mg total) by mouth daily. 90 tablet 1   Inulin (FIBER CHOICE PO) Take 1 tablet by mouth daily as needed.     levothyroxine (SYNTHROID) 75 MCG tablet TAKE 1 TABLET EVERY DAY BEFORE BREAKFAST 90 tablet 3   ondansetron (ZOFRAN-ODT) 4 MG disintegrating tablet TAKE 1 TABLET BY MOUTH EVERY 8 HOURS AS NEEDED FOR NAUSEA AND VOMITING 20 tablet 3   Peppermint Oil (IBGARD PO) Take by mouth.     raloxifene (EVISTA) 60 MG tablet Take 1 tablet (60 mg total) by mouth daily. 90 tablet 1   simvastatin (ZOCOR) 20 MG tablet TAKE 1 TABLET EVERY EVENING 90 tablet 3   No current facility-administered medications for this visit.    Allergies as of 04/22/2023 - Review Complete 04/22/2023  Allergen Reaction Noted   Besivance [besifloxacin hcl] Shortness Of Breath and Other (See Comments) 06/13/2014   Polymyxin b  05/16/2021   Alendronate sodium Other (See Comments) 05/21/2010   Dilaudid [hydromorphone hcl] Itching 04/30/2011   Codeine Nausea And Vomiting and  Other (See Comments) 07/20/2019   Flonase [fluticasone]  10/17/2019   Ibandronate sodium Other (See Comments) 05/21/2010    ROS:  General: Negative for anorexia, weight loss, fever, chills, fatigue, weakness. ENT: Negative for hoarseness, difficulty swallowing , nasal congestion. CV: Negative for chest pain, angina, palpitations, dyspnea on exertion, peripheral edema.  Respiratory: Negative for dyspnea at rest, dyspnea on exertion, cough, sputum, wheezing.  GI: See history of present illness. GU:  Negative for dysuria, hematuria, urinary incontinence, urinary frequency, nocturnal urination.  Endo: Negative for unusual weight change.    Physical Examination:   BP (!) 184/79 (BP  Location: Left Arm, Patient Position: Sitting, Cuff Size: Normal)   Pulse 90   Temp 97.9 F (36.6 C) (Oral)   Wt 129 lb (58.5 kg)   BMI 23.59 kg/m   General: Well-nourished, well-developed in no acute distress.  Eyes: No icterus. Conjunctivae pink. Neuro: Alert and oriented x 3.  Grossly intact. Skin: Warm and dry, no jaundice.   Psych: Alert and cooperative, normal mood and affect.  Labs:    Imaging Studies: No results found.  Assessment and Plan:   Crystal Kidd is a 77 y.o. y/o female who was sent to me for evaluation for possible LPR.  The patient has a cough and denies any heartburn and her antireflux measures such as not eating for 4 hours before going to sleep or sitting up with a wedge to go to sleep and her PPI have not made any difference in her cough.  She does report that when she took Benadryl she noticed an improvement and her symptoms are worse when she goes outside to do yard work.  The patient thinks that her symptoms are more consistent with allergies.  The patient is now on multiple antireflux measures and medication and I do not recommend the patient undergoing antireflux surgery at her age since her symptoms do not appear to be related to her reflux.  The patient has been explained the plan and agrees with it.     Midge Minium, MD. Clementeen Graham    Note: This dictation was prepared with Dragon dictation along with smaller phrase technology. Any transcriptional errors that result from this process are unintentional.

## 2023-04-23 ENCOUNTER — Encounter: Payer: Self-pay | Admitting: Gastroenterology

## 2023-04-29 ENCOUNTER — Ambulatory Visit: Payer: Medicare HMO | Admitting: General Practice

## 2023-04-29 ENCOUNTER — Encounter
Admission: RE | Disposition: A | Discharge status: Home / Self Care | Payer: Self-pay | Source: Home / Self Care | Attending: Gastroenterology

## 2023-04-29 ENCOUNTER — Ambulatory Visit
Admission: RE | Admit: 2023-04-29 | Discharge: 2023-04-29 | Disposition: A | Discharge status: Home / Self Care | Payer: Medicare HMO | Attending: Gastroenterology | Admitting: Gastroenterology

## 2023-04-29 ENCOUNTER — Other Ambulatory Visit: Payer: Self-pay

## 2023-04-29 ENCOUNTER — Encounter: Payer: Self-pay | Admitting: Gastroenterology

## 2023-04-29 DIAGNOSIS — Z09 Encounter for follow-up examination after completed treatment for conditions other than malignant neoplasm: Secondary | ICD-10-CM | POA: Insufficient documentation

## 2023-04-29 DIAGNOSIS — K5730 Diverticulosis of large intestine without perforation or abscess without bleeding: Secondary | ICD-10-CM | POA: Diagnosis not present

## 2023-04-29 DIAGNOSIS — K219 Gastro-esophageal reflux disease without esophagitis: Secondary | ICD-10-CM | POA: Insufficient documentation

## 2023-04-29 DIAGNOSIS — E039 Hypothyroidism, unspecified: Secondary | ICD-10-CM | POA: Insufficient documentation

## 2023-04-29 DIAGNOSIS — I10 Essential (primary) hypertension: Secondary | ICD-10-CM | POA: Diagnosis not present

## 2023-04-29 DIAGNOSIS — Z86010 Personal history of colonic polyps: Secondary | ICD-10-CM

## 2023-04-29 DIAGNOSIS — Z9049 Acquired absence of other specified parts of digestive tract: Secondary | ICD-10-CM | POA: Insufficient documentation

## 2023-04-29 DIAGNOSIS — D126 Benign neoplasm of colon, unspecified: Secondary | ICD-10-CM

## 2023-04-29 DIAGNOSIS — K641 Second degree hemorrhoids: Secondary | ICD-10-CM | POA: Insufficient documentation

## 2023-04-29 DIAGNOSIS — Z79899 Other long term (current) drug therapy: Secondary | ICD-10-CM | POA: Diagnosis not present

## 2023-04-29 DIAGNOSIS — K649 Unspecified hemorrhoids: Secondary | ICD-10-CM | POA: Diagnosis not present

## 2023-04-29 DIAGNOSIS — E785 Hyperlipidemia, unspecified: Secondary | ICD-10-CM | POA: Diagnosis not present

## 2023-04-29 DIAGNOSIS — Z1211 Encounter for screening for malignant neoplasm of colon: Secondary | ICD-10-CM | POA: Diagnosis not present

## 2023-04-29 HISTORY — PX: COLONOSCOPY WITH PROPOFOL: SHX5780

## 2023-04-29 HISTORY — DX: Other complications of anesthesia, initial encounter: T88.59XA

## 2023-04-29 HISTORY — DX: Other specified postprocedural states: Z98.890

## 2023-04-29 SURGERY — COLONOSCOPY WITH PROPOFOL
Anesthesia: General

## 2023-04-29 MED ORDER — LIDOCAINE HCL (PF) 2 % IJ SOLN
INTRAMUSCULAR | Status: AC
  Filled 2023-04-29: qty 5

## 2023-04-29 MED ORDER — PHENYLEPHRINE HCL (PRESSORS) 10 MG/ML IV SOLN
INTRAVENOUS | Status: DC | PRN
  Administered 2023-04-29 (×3): 80 ug via INTRAVENOUS

## 2023-04-29 MED ORDER — PROPOFOL 10 MG/ML IV BOLUS
INTRAVENOUS | Status: AC
  Filled 2023-04-29: qty 20

## 2023-04-29 MED ORDER — SODIUM CHLORIDE 0.9 % IV SOLN: INTRAVENOUS | Status: DC

## 2023-04-29 MED ORDER — LIDOCAINE HCL (CARDIAC) PF 100 MG/5ML IV SOSY
PREFILLED_SYRINGE | INTRAVENOUS | Status: DC | PRN
  Administered 2023-04-29: 100 mg via INTRAVENOUS

## 2023-04-29 MED ORDER — PHENYLEPHRINE 80 MCG/ML (10ML) SYRINGE FOR IV PUSH (FOR BLOOD PRESSURE SUPPORT)
PREFILLED_SYRINGE | INTRAVENOUS | Status: AC
  Filled 2023-04-29: qty 10

## 2023-04-29 MED ORDER — PROPOFOL 10 MG/ML IV BOLUS
INTRAVENOUS | Status: DC | PRN
  Administered 2023-04-29: 100 mg via INTRAVENOUS
  Administered 2023-04-29: 160 ug/kg/min via INTRAVENOUS

## 2023-04-29 NOTE — Anesthesia Postprocedure Evaluation (Signed)
Anesthesia Post Note  Patient: Crystal Kidd  Procedure(s) Performed: COLONOSCOPY WITH PROPOFOL  Patient location during evaluation: PACU Anesthesia Type: General Level of consciousness: awake and alert Pain management: pain level controlled Vital Signs Assessment: post-procedure vital signs reviewed and stable Respiratory status: spontaneous breathing, nonlabored ventilation, respiratory function stable and patient connected to nasal cannula oxygen Cardiovascular status: blood pressure returned to baseline and stable Postop Assessment: no apparent nausea or vomiting Anesthetic complications: no  No notable events documented.   Last Vitals:  Vitals:   04/29/23 0833 04/29/23 0843  BP: (!) 111/53 113/60  Pulse:    Resp:    Temp:    SpO2:      Last Pain:  Vitals:   04/29/23 0843  TempSrc:   PainSc: 0-No pain                 Stephanie Coup

## 2023-04-29 NOTE — Transfer of Care (Signed)
Immediate Anesthesia Transfer of Care Note  Patient: Crystal Kidd  Procedure(s) Performed: COLONOSCOPY WITH PROPOFOL  Patient Location: Endoscopy Unit  Anesthesia Type:General  Level of Consciousness: awake and drowsy  Airway & Oxygen Therapy: Patient Spontanous Breathing  Post-op Assessment: Report given to RN and Post -op Vital signs reviewed and stable  Post vital signs: Reviewed and stable  Last Vitals:  Vitals Value Taken Time  BP 99/48 04/29/23 0825  Temp 36.4 C 04/29/23 0823  Pulse 71 04/29/23 0826  Resp 22 04/29/23 0827  SpO2 95 % 04/29/23 0826  Vitals shown include unfiled device data.  Last Pain:  Vitals:   04/29/23 0823  TempSrc: Temporal  PainSc: 0-No pain         Complications: No notable events documented.

## 2023-04-29 NOTE — Op Note (Signed)
[facility] Gastroenterology Patient Name: Crystal Kidd Procedure Date: 04/29/2023 7:33 AM MRN: [id number] Account #: [id number] Date of Birth: 06/15/1946 Admit Type: Outpatient Age: 77 Room: [facility] ENDO ROOM 4 Gender: Female Note Status: Finalized Instrument Name: Prentice Docker [id number] Procedure:             Colonoscopy Indications:           High risk colon cancer surveillance: Personal history                         of colonic polyps Providers:             Midge Minium MD, MD Referring MD:          Duncan Dull, MD (Referring MD) Medicines:             Propofol per Anesthesia Complications:         No immediate complications. Procedure:             Pre-Anesthesia Assessment:                        - Prior to the procedure, a History and Physical was                         performed, and patient medications and allergies were                         reviewed. The patient's tolerance of previous                         anesthesia was also reviewed. The risks and benefits                         of the procedure and the sedation options and risks                         were discussed with the patient. All questions were                         answered, and informed consent was obtained. Prior                         Anticoagulants: The patient has taken no anticoagulant                         or antiplatelet agents. ASA Grade Assessment: II - A                         patient with mild systemic disease. After reviewing                         the risks and benefits, the patient was deemed in                         satisfactory condition to undergo the procedure.                        After obtaining informed consent, the colonoscope was  passed under direct vision. Throughout the procedure,                         the patient's blood pressure, pulse, and oxygen                         saturations were monitored continuously. The                          Colonoscope was introduced through the anus and                         advanced to the the cecum, identified by appendiceal                         orifice and ileocecal valve. The colonoscopy was                         performed without difficulty. The patient tolerated                         the procedure well. The quality of the bowel                         preparation was excellent. Findings:      The perianal and digital rectal examinations were normal.      Multiple small-mouthed diverticula were found in the sigmoid colon.      Non-bleeding internal hemorrhoids were found during retroflexion. The       hemorrhoids were Grade II (internal hemorrhoids that prolapse but reduce       spontaneously). Impression:            - Diverticulosis in the sigmoid colon.                        - Non-bleeding internal hemorrhoids.                        - No specimens collected. Recommendation:        - Discharge patient to home.                        - Resume previous diet.                        - Continue present medications.                        - Repeat colonoscopy is not recommended for                         surveillance. Procedure Code(s):     --- Professional ---                        [phone], Colonoscopy, flexible; diagnostic, including                         collection of specimen(s) by brushing or washing, when  performed (separate procedure) Diagnosis Code(s):     --- Professional ---                        Z86.010, Personal history of colonic polyps CPT copyright 2022 [employer]. All rights reserved. The codes documented in this report are preliminary and upon coder review may  be revised to meet current compliance requirements. Midge Minium MD, MD 04/29/2023 8:21:32 AM This report has been signed electronically. Number of Addenda: 0 Note Initiated On: 04/29/2023 7:33 AM Scope Withdrawal Time: 0 hours 8 minutes 56  seconds  Total Procedure Duration: 0 hours 15 minutes 5 seconds  Estimated Blood Loss:  Estimated blood loss: none.      [facility]

## 2023-04-29 NOTE — Anesthesia Preprocedure Evaluation (Signed)
Anesthesia Evaluation  Patient identified by MRN, date of birth, ID band Patient awake    Reviewed: Allergy & Precautions, NPO status , Patient's Chart, lab work & pertinent test results  History of Anesthesia Complications Negative for: history of anesthetic complications  Airway Mallampati: II  TM Distance: >3 FB Neck ROM: Full    Dental  (+) Dental Advisory Given, Partial Upper,    Pulmonary neg pulmonary ROS   Pulmonary exam normal        Cardiovascular hypertension, Pt. on medications negative cardio ROS Normal cardiovascular exam     Neuro/Psych negative neurological ROS  negative psych ROS   GI/Hepatic negative GI ROS, Neg liver ROS,GERD  ,,  Endo/Other  negative endocrine ROSHypothyroidism    Renal/GU Renal InsufficiencyRenal diseasenegative Renal ROS  negative genitourinary   Musculoskeletal negative musculoskeletal ROS (+)    Abdominal   Peds  Hematology negative hematology ROS (+)   Anesthesia Other Findings Past Medical History: No date: Allergic rhinoconjunctivitis 1992: Allergy No date: Arthritis No date: Cataract No date: Complication of anesthesia No date: Diverticulosis No date: Eosinophilic esophagitis No date: GERD (gastroesophageal reflux disease)     Comment:  PMH of esophageal stricture No date: Hyperlipidemia No date: Hypertension No date: Osteopenia     Comment:  BMD @[facility]  No date: PONV (postoperative nausea and vomiting) No date: Thyroid disease     Comment:  hypothyroidism 1968: Transfusion history     Comment:  post partum 04/13/2015: Trapezius muscle spasm No date: Vitiligo  Past Surgical History: No date: ABDOMINAL HYSTERECTOMY     Comment:  for dysfunctional menses; USO with TAH No date: APPENDECTOMY 1972: BREAST EXCISIONAL BIOPSY; Right     Comment:  NEG 1980'S: BREAST EXCISIONAL BIOPSY; Left     Comment:  NEG No date: BREAST SURGERY     Comment:  biopsy X  2 04/30/2016: CHOLECYSTECTOMY; N/A     Comment:  Procedure: LAPAROSCOPIC CHOLECYSTECTOMY;  Surgeon: Violeta Gelinas, MD;  Location: [facility];  Service: General;                Laterality: N/A; No date: COLONOSCOPY     Comment:  X2; Tics; Dr Russella Dar, GI No date: COLONOSCOPY No date: ESOPHAGEAL DILATION     Comment:  X2 10/19/2019: ESOPHAGOGASTRODUODENOSCOPY (EGD) WITH PROPOFOL; N/A     Comment:  Procedure: ESOPHAGOGASTRODUODENOSCOPY (EGD) WITH               PROPOFOL;  Surgeon: Napoleon Form, MD;  Location:               [facility];  Service: Endoscopy;  Laterality: N/A; No date: EYE SURGERY     Comment:  cataracts removed 2003or 2004: FRACTURE SURGERY No date: ROTATOR CUFF REPAIR     Comment:  R shoulder No date: TUBAL LIGATION     Comment:  befor hysterectomy No date: UPPER GASTROINTESTINAL ENDOSCOPY  BMI    Body Mass Index: 23.16 kg/m      Reproductive/Obstetrics negative OB ROS                             Anesthesia Physical Anesthesia Plan  ASA: 2  Anesthesia Plan: General   Post-op Pain Management: Minimal or no pain anticipated   Induction: Intravenous  PONV Risk Score and Plan: 3 and Propofol infusion, TIVA and Ondansetron  Airway Management Planned: Nasal Cannula  Additional Equipment: None  Intra-op Plan:   Post-operative Plan:   Informed Consent: I have reviewed the patients History and Physical, chart, labs and discussed the procedure including the risks, benefits and alternatives for the proposed anesthesia with the patient or authorized representative who has indicated his/her understanding and acceptance.     Dental advisory given  Plan Discussed with: CRNA and Surgeon  Anesthesia Plan Comments: (Discussed risks of anesthesia with patient, including possibility of difficulty with spontaneous ventilation under anesthesia necessitating airway intervention, PONV, and rare risks such as cardiac or respiratory  or neurological events, and allergic reactions. Discussed the role of CRNA in patient's perioperative care. Patient understands.)        Anesthesia Quick Evaluation

## 2023-04-29 NOTE — H&P (Signed)
Crystal Minium, MD [facility] [address], [address] [place], [place] [address] [phone] Fax : [phone]  Primary Care Physician:  Sherlene Shams, MD Primary Gastroenterologist:  Dr. Servando Snare  Pre-Procedure History & Physical: HPI:  Crystal Kidd is a 77 y.o. female is here for an colonoscopy.   Past Medical History:  Diagnosis Date   Allergic rhinoconjunctivitis    Allergy 1992   Arthritis    Cataract    Complication of anesthesia    Diverticulosis    Eosinophilic esophagitis    GERD (gastroesophageal reflux disease)    PMH of esophageal stricture   Hyperlipidemia    Hypertension    Osteopenia    BMD @[facility]    PONV (postoperative nausea and vomiting)    Thyroid disease    hypothyroidism   Transfusion history 1968   post partum   Trapezius muscle spasm 04/13/2015   Vitiligo     Past Surgical History:  Procedure Laterality Date   ABDOMINAL HYSTERECTOMY     for dysfunctional menses; USO with TAH   APPENDECTOMY     BREAST EXCISIONAL BIOPSY Right 1972   NEG   BREAST EXCISIONAL BIOPSY Left 1980'S   NEG   BREAST SURGERY     biopsy X 2   CHOLECYSTECTOMY N/A 04/30/2016   Procedure: LAPAROSCOPIC CHOLECYSTECTOMY;  Surgeon: Violeta Gelinas, MD;  Location: [facility];  Service: General;  Laterality: N/A;   COLONOSCOPY     X2; Tics; Dr Russella Dar, GI   COLONOSCOPY     ESOPHAGEAL DILATION     X2   ESOPHAGOGASTRODUODENOSCOPY (EGD) WITH PROPOFOL N/A 10/19/2019   Procedure: ESOPHAGOGASTRODUODENOSCOPY (EGD) WITH PROPOFOL;  Surgeon: Napoleon Form, MD;  Location: [facility];  Service: Endoscopy;  Laterality: N/A;   EYE SURGERY     cataracts removed   FRACTURE SURGERY  2003or 2004   ROTATOR CUFF REPAIR     R shoulder   TUBAL LIGATION     befor hysterectomy   UPPER GASTROINTESTINAL ENDOSCOPY      Prior to Admission medications   Medication Sig Start Date End Date Taking? Authorizing Provider  amLODipine (NORVASC) 5 MG tablet TAKE 1 TABLET EVERY DAY 09/23/22  Yes Sherlene Shams, MD  esomeprazole (NEXIUM) 40 MG capsule TAKE 1 CAPSULE EVERY DAY BEFORE BREAKFAST 10/28/22  Yes Sherlene Shams, MD  levothyroxine (SYNTHROID) 75 MCG tablet TAKE 1 TABLET EVERY DAY BEFORE BREAKFAST 09/12/22  Yes Sherlene Shams, MD  simvastatin (ZOCOR) 20 MG tablet TAKE 1 TABLET EVERY EVENING 02/11/23  Yes Sherlene Shams, MD  Alum Hydroxide-Mag Carbonate (GAVISCON PO) Take 1 capsule by mouth daily.    [provider]  Calcium-Phosphorus-Vitamin D (CALCIUM GUMMIES PO) Take by mouth.    [provider]  carboxymethylcellul-glycerin (REFRESH OPTIVE) 0.5-0.9 % ophthalmic solution Place 1 drop into both eyes as needed for dry eyes.    [provider]  cholecalciferol (VITAMIN D3) 25 MCG (1000 UNIT) tablet Take 1,000 Units by mouth daily.    [provider]  famotidine (PEPCID) 20 MG tablet TAKE 1 TABLET (20 MG TOTAL) BY MOUTH DAILY. PRIOR TO DINNER 04/16/23   Sherlene Shams, MD  gabapentin (NEURONTIN) 100 MG capsule TAKE 1 CAPSULE (100 MG TOTAL) BY MOUTH 3 (THREE) TIMES DAILY. EVERY 8 HOURS AS NEEDED 10/18/22   Sherlene Shams, MD  hydrochlorothiazide (HYDRODIURIL) 25 MG tablet Take 1 tablet (25 mg total) by mouth daily. 10/26/19   Sherlene Shams, MD  Inulin (FIBER CHOICE PO) Take 1  tablet by mouth daily as needed.    [provider]  ondansetron (ZOFRAN-ODT) 4 MG disintegrating tablet TAKE 1 TABLET BY MOUTH EVERY 8 HOURS AS NEEDED FOR NAUSEA AND VOMITING 08/07/21   Sherlene Shams, MD  Peppermint Oil (IBGARD PO) Take by mouth.    [provider]  raloxifene (EVISTA) 60 MG tablet Take 1 tablet (60 mg total) by mouth daily. 04/21/23   Sherlene Shams, MD    Allergies as of 10/21/2022 - Review Complete 10/18/2022  Allergen Reaction Noted   Besivance [besifloxacin hcl] Shortness Of Breath and Other (See Comments) 06/13/2014   Polymyxin b  05/16/2021   Alendronate sodium Other (See Comments) 05/21/2010   Dilaudid [hydromorphone hcl] Itching  04/30/2011   Codeine Nausea And Vomiting and Other (See Comments) 07/20/2019   Flonase [fluticasone]  10/17/2019   Ibandronate sodium Other (See Comments) 05/21/2010    Family History  Problem Relation Age of Onset   Stomach cancer Mother    Breast cancer Mother    Emphysema Father    Breast cancer Sister        mammograms @ [facility]   Colon polyps Sister    Coronary artery disease Brother    COPD Brother    Colon polyps Brother    Liver disease Brother    Colon cancer Maternal Aunt    Cancer Maternal Aunt 74       colon cA   Diabetes Neg Hx    Stroke Neg Hx    Heart disease Neg Hx    Esophageal cancer Neg Hx    Rectal cancer Neg Hx     Social History   Socioeconomic History   Marital status: Widowed    Spouse name: Not on file   Number of children: 2   Years of education: Not on file   Highest education level: Not on file  Occupational History   Occupation: Retired  Tobacco Use   Smoking status: Never   Smokeless tobacco: Never  Vaping Use   Vaping status: Never Used  Substance and Sexual Activity   Alcohol use: Not Currently    Comment: have wine 1 glass every 6 months   Drug use: No   Sexual activity: Not Currently  Other Topics Concern   Not on file  Social History Narrative   Not on file   Social Determinants of Health   Financial Resource Strain: Low Risk  (12/27/2022)   Overall Financial Resource Strain (CARDIA)    Difficulty of Paying Living Expenses: Not hard at all  Food Insecurity: No Food Insecurity (03/20/2023)   Hunger Vital Sign    Worried About Running Out of Food in the Last Year: Never true    Ran Out of Food in the Last Year: Never true  Transportation Needs: No Transportation Needs (03/20/2023)   PRAPARE - Administrator, Civil Service (Medical): No    Lack of Transportation (Non-Medical): No  Physical Activity: Sufficiently Active (12/27/2022)   Exercise Vital Sign    Days of Exercise per Week: 5 days    Minutes of Exercise  per Session: 30 min  Stress: No Stress Concern Present (12/27/2022)   [employer] of Occupational Health - Occupational Stress Questionnaire    Feeling of Stress : Not at all  Social Connections: Moderately Integrated (12/27/2022)   Social Connection and Isolation Panel [NHANES]    Frequency of Communication with Friends and Family: More than three times a week    Frequency of Social  Gatherings with Friends and Family: More than three times a week    Attends Religious Services: More than 4 times per year    Active Member of Clubs or Organizations: Yes    Attends Banker Meetings: Never    Marital Status: Widowed  Intimate Partner Violence: Not At Risk (12/27/2022)   Humiliation, Afraid, Rape, and Kick questionnaire    Fear of Current or Ex-Partner: No    Emotionally Abused: No    Physically Abused: No    Sexually Abused: No    Review of Systems: See HPI, otherwise negative ROS  Physical Exam: BP (!) 151/60   Pulse 76   Temp (!) 96.9 F (36.1 C) (Temporal)   Resp 16   Wt 57.4 kg   SpO2 97%   BMI 23.16 kg/m  General:   Alert,  pleasant and cooperative in NAD Head:  Normocephalic and atraumatic. Neck:  Supple; no masses or thyromegaly. Lungs:  Clear throughout to auscultation.    Heart:  Regular rate and rhythm. Abdomen:  Soft, nontender and nondistended. Normal bowel sounds, without guarding, and without rebound.   Neurologic:  Alert and  oriented x4;  grossly normal neurologically.  Impression/Plan: DESERA DUMMER is here for an colonoscopy to be performed for a history of adenomatous polyps on 2021   Risks, benefits, limitations, and alternatives regarding  colonoscopy have been reviewed with the patient.  Questions have been answered.  All parties agreeable.   Crystal Minium, MD  04/29/2023, 7:56 AM

## 2023-04-30 ENCOUNTER — Encounter: Payer: Self-pay | Admitting: Gastroenterology

## 2023-04-30 ENCOUNTER — Other Ambulatory Visit: Payer: Self-pay | Admitting: Internal Medicine

## 2023-05-01 ENCOUNTER — Encounter: Payer: Self-pay | Admitting: Internal Medicine

## 2023-05-06 ENCOUNTER — Ambulatory Visit (INDEPENDENT_AMBULATORY_CARE_PROVIDER_SITE_OTHER): Payer: Medicare HMO

## 2023-05-06 DIAGNOSIS — Z23 Encounter for immunization: Secondary | ICD-10-CM

## 2023-05-06 NOTE — Progress Notes (Signed)
Patient presented for High Dose flu injection to right deltoid, patient voiced no concerns nor showed any signs of distress during injection

## 2023-08-01 ENCOUNTER — Other Ambulatory Visit: Payer: Self-pay | Admitting: Internal Medicine

## 2023-08-01 DIAGNOSIS — M3501 Sicca syndrome with keratoconjunctivitis: Secondary | ICD-10-CM | POA: Diagnosis not present

## 2023-08-01 DIAGNOSIS — H02889 Meibomian gland dysfunction of unspecified eye, unspecified eyelid: Secondary | ICD-10-CM | POA: Diagnosis not present

## 2023-08-01 DIAGNOSIS — Z0100 Encounter for examination of eyes and vision without abnormal findings: Secondary | ICD-10-CM | POA: Diagnosis not present

## 2023-08-14 ENCOUNTER — Other Ambulatory Visit (HOSPITAL_COMMUNITY): Payer: Self-pay

## 2023-09-15 ENCOUNTER — Other Ambulatory Visit (HOSPITAL_COMMUNITY): Payer: Self-pay

## 2023-09-15 ENCOUNTER — Telehealth: Payer: Self-pay

## 2023-09-15 NOTE — Telephone Encounter (Signed)
Pharmacy Patient Advocate Encounter   Received notification from Patient Pharmacy that prior authorization for Ondanestron 4mg  ODT tabs is required/requested.   Insurance verification completed.   The patient is insured through [facility] ADVANTAGE/RX ADVANCE .   Per test claim: PA required; PA submitted to above mentioned insurance via CoverMyMeds Key/confirmation #/EOC [id number] Status is pending

## 2023-09-16 ENCOUNTER — Encounter: Payer: Self-pay | Admitting: Internal Medicine

## 2023-09-16 ENCOUNTER — Telehealth: Payer: Self-pay | Admitting: *Deleted

## 2023-09-16 NOTE — Telephone Encounter (Unsigned)
 Copied from CRM [id number]. Topic: Clinical - Prescription Issue >> Sep 16, 2023  2:15 PM Pascal Lux wrote: Reason for CRM: Patient stated she was not able to get her ondansetron (ZOFRAN-ODT) 4 MG disintegrating tablet [[id number]] from pharmacy they rejected it and said insurance will not approve it for GI issues and it's for people having surgery.

## 2023-09-16 NOTE — Telephone Encounter (Signed)
 Copied from CRM [id number]. Topic: Clinical - Prescription Issue >> Sep 16, 2023  2:15 PM Macario HERO wrote: Reason for CRM: Patient stated she was not able to get her ondansetron  (ZOFRAN -ODT) 4 MG disintegrating tablet [[id number]] from pharmacy they rejected it and said insurance will not approve it for GI issues and it's for people having surgery.

## 2023-09-17 NOTE — Telephone Encounter (Signed)
 noted

## 2023-09-18 NOTE — Telephone Encounter (Signed)
 Pt is aware and she paid for rx out of pocket.

## 2023-09-18 NOTE — Telephone Encounter (Signed)
 Pharmacy Patient Advocate Encounter  Received notification from [facility] ADVANTAGE/RX ADVANCE that Prior Authorization for Ondansetron 4mg  has been DENIED.  Full denial letter will be uploaded to the media tab. See denial reason below.

## 2023-09-18 NOTE — Telephone Encounter (Signed)
 Pt sent a mychart stating that she filled the rx and paid for it out of pocket.

## 2023-10-11 ENCOUNTER — Other Ambulatory Visit: Payer: Self-pay | Admitting: Internal Medicine

## 2023-10-20 ENCOUNTER — Ambulatory Visit (INDEPENDENT_AMBULATORY_CARE_PROVIDER_SITE_OTHER): Payer: Medicare HMO | Admitting: Internal Medicine

## 2023-10-20 ENCOUNTER — Ambulatory Visit: Payer: Medicare HMO | Admitting: Internal Medicine

## 2023-10-20 ENCOUNTER — Ambulatory Visit

## 2023-10-20 ENCOUNTER — Encounter: Payer: Self-pay | Admitting: Internal Medicine

## 2023-10-20 VITALS — BP 126/71 | HR 79 | Ht 62.0 in | Wt 133.0 lb

## 2023-10-20 DIAGNOSIS — Z1231 Encounter for screening mammogram for malignant neoplasm of breast: Secondary | ICD-10-CM

## 2023-10-20 DIAGNOSIS — I10 Essential (primary) hypertension: Secondary | ICD-10-CM

## 2023-10-20 DIAGNOSIS — K224 Dyskinesia of esophagus: Secondary | ICD-10-CM | POA: Diagnosis not present

## 2023-10-20 DIAGNOSIS — E785 Hyperlipidemia, unspecified: Secondary | ICD-10-CM | POA: Diagnosis not present

## 2023-10-20 DIAGNOSIS — R7301 Impaired fasting glucose: Secondary | ICD-10-CM | POA: Diagnosis not present

## 2023-10-20 DIAGNOSIS — R1319 Other dysphagia: Secondary | ICD-10-CM

## 2023-10-20 DIAGNOSIS — D126 Benign neoplasm of colon, unspecified: Secondary | ICD-10-CM | POA: Diagnosis not present

## 2023-10-20 DIAGNOSIS — R053 Chronic cough: Secondary | ICD-10-CM

## 2023-10-20 DIAGNOSIS — I700 Atherosclerosis of aorta: Secondary | ICD-10-CM | POA: Diagnosis not present

## 2023-10-20 DIAGNOSIS — J189 Pneumonia, unspecified organism: Secondary | ICD-10-CM

## 2023-10-20 DIAGNOSIS — E034 Atrophy of thyroid (acquired): Secondary | ICD-10-CM | POA: Diagnosis not present

## 2023-10-20 DIAGNOSIS — M818 Other osteoporosis without current pathological fracture: Secondary | ICD-10-CM

## 2023-10-20 DIAGNOSIS — R059 Cough, unspecified: Secondary | ICD-10-CM | POA: Diagnosis not present

## 2023-10-20 DIAGNOSIS — R918 Other nonspecific abnormal finding of lung field: Secondary | ICD-10-CM | POA: Diagnosis not present

## 2023-10-20 LAB — COMPREHENSIVE METABOLIC PANEL
ALT: 14 U/L (ref 0–35)
AST: 20 U/L (ref 0–37)
Albumin: 4.2 g/dL (ref 3.5–5.2)
Alkaline Phosphatase: 105 U/L (ref 39–117)
BUN: 10 mg/dL (ref 6–23)
CO2: 29 meq/L (ref 19–32)
Calcium: 9.6 mg/dL (ref 8.4–10.5)
Chloride: 105 meq/L (ref 96–112)
Creatinine, Ser: 0.73 mg/dL (ref 0.40–1.20)
GFR: 78.82 mL/min (ref >60.00)
Glucose, Bld: 89 mg/dL (ref 70–99)
Potassium: 4 meq/L (ref 3.5–5.1)
Sodium: 142 meq/L (ref 135–145)
Total Bilirubin: 0.5 mg/dL (ref 0.2–1.2)
Total Protein: 7.1 g/dL (ref 6.0–8.3)

## 2023-10-20 LAB — LDL CHOLESTEROL, DIRECT: Direct LDL: 74 mg/dL

## 2023-10-20 LAB — MICROALBUMIN / CREATININE URINE RATIO
Creatinine,U: 22.1 mg/dL
Microalb Creat Ratio: 31.7 mg/g — ABNORMAL HIGH (ref 0.0–30.0)
Microalb, Ur: <0.7 mg/dL (ref 0.0–1.9)

## 2023-10-20 LAB — LIPID PANEL
Cholesterol: 140 mg/dL (ref 0–200)
HDL: 55.9 mg/dL (ref >39.00)
LDL Cholesterol: 68 mg/dL (ref 0–99)
NonHDL: 83.94
Total CHOL/HDL Ratio: 3
Triglycerides: 78 mg/dL (ref 0.0–149.0)
VLDL: 15.6 mg/dL (ref 0.0–40.0)

## 2023-10-20 LAB — HEMOGLOBIN A1C: Hgb A1c MFr Bld: 5.6 % (ref 4.6–6.5)

## 2023-10-20 LAB — TSH: TSH: 1.75 u[IU]/mL (ref 0.35–5.50)

## 2023-10-20 MED ORDER — ESOMEPRAZOLE MAGNESIUM 40 MG PO CPDR: 40.0000 mg | DELAYED_RELEASE_CAPSULE | Freq: Every day | ORAL | 1 refills | Status: DC

## 2023-10-20 MED ORDER — RALOXIFENE HCL 60 MG PO TABS: 60.0000 mg | ORAL_TABLET | Freq: Every day | ORAL | 1 refills | Status: DC

## 2023-10-20 MED ORDER — AMLODIPINE BESYLATE 5 MG PO TABS: 5.0000 mg | ORAL_TABLET | Freq: Every day | ORAL | 3 refills | Status: DC

## 2023-10-20 MED ORDER — BENZONATATE 200 MG PO CAPS: 200.0000 mg | ORAL_CAPSULE | Freq: Three times a day (TID) | ORAL | 1 refills | Status: DC | PRN

## 2023-10-20 NOTE — Assessment & Plan Note (Signed)
 Subjective, with no evidence of stricture on last EGD and last  DG esophagus.  She declined to swallow the barium tablet. disordered peristalsis noted on swallow evaluation .

## 2023-10-20 NOTE — Progress Notes (Signed)
 Subjective:  Patient ID: Crystal Kidd, female    DOB: 04-Sep-1945  Age: 78 y.o. MRN: [id number]  CC: The primary encounter diagnosis was Encounter for screening mammogram for malignant neoplasm of breast. Diagnoses of Hypothyroidism due to acquired atrophy of thyroid, Impaired fasting glucose, Hyperlipidemia LDL goal <100, Elevated blood pressure reading in office with white coat syndrome, with diagnosis of hypertension, Other osteoporosis without current pathological fracture, Tubular adenoma of colon, Esophageal dysphagia, Persistent cough for 3 weeks or longer, Esophageal dysmotility, Thoracic aortic atherosclerosis (HCC), and White coat syndrome with hypertension were also pertinent to this visit.   HPI Crystal Kidd presents for  Chief Complaint  Patient presents with   Medical Management of Chronic Issues    6 month follow up    1) Energy level "good"  I'm working in the yard    2)  daily Cough since January .  Saw ENT  in November for cough which resolved and was attributed to GERD.  Advised by Crystal Kidd to take more allergy tablets .  Taking gneric genreic claritin bid.  The cough is moist  and productive . The cough is so hard it takes her breath.   3) GERD : taking nexium once daily  30 minutes before eating.   And famotidine 20 mg before dinner.  Using  a wedge pillow   4) Hypertension: patient checks blood pressure daily  at home. On a caibrated machine.  Her eadings have been <130/80 at rest . Patient is following a reduced salt diet most days and is taking medications as prescribed   5 IBS: using IBGuard   Outpatient Medications Prior to Visit  Medication Sig Dispense Refill   Alum Hydroxide-Mag Carbonate (GAVISCON PO) Take 1 capsule by mouth daily.     Calcium-Phosphorus-Vitamin D (CALCIUM GUMMIES PO) Take by mouth.     carboxymethylcellul-glycerin (REFRESH OPTIVE) 0.5-0.9 % ophthalmic solution Place 1 drop into both eyes as needed for dry eyes.     cholecalciferol (VITAMIN  D3) 25 MCG (1000 UNIT) tablet Take 1,000 Units by mouth daily.     famotidine (PEPCID) 20 MG tablet TAKE 1 TABLET (20 MG TOTAL) BY MOUTH DAILY PRIOR TO DINNER 90 tablet 1   gabapentin (NEURONTIN) 100 MG capsule TAKE 1 CAPSULE (100 MG TOTAL) BY MOUTH 3 (THREE) TIMES DAILY. EVERY 8 HOURS AS NEEDED 90 capsule 1   Inulin (FIBER CHOICE PO) Take 1 tablet by mouth daily as needed.     levothyroxine (SYNTHROID) 75 MCG tablet TAKE 1 TABLET EVERY DAY BEFORE BREAKFAST 90 tablet 3   ondansetron (ZOFRAN-ODT) 4 MG disintegrating tablet TAKE 1 TABLET BY MOUTH EVERY 8 HOURS AS NEEDED FOR NAUSEA AND VOMITING 20 tablet 3   Peppermint Oil (IBGARD PO) Take by mouth.     simvastatin (ZOCOR) 20 MG tablet TAKE 1 TABLET EVERY EVENING 90 tablet 3   amLODipine (NORVASC) 5 MG tablet TAKE 1 TABLET EVERY DAY 90 tablet 3   esomeprazole (NEXIUM) 40 MG capsule TAKE 1 CAPSULE EVERY DAY BEFORE BREAKFAST 90 capsule 3   raloxifene (EVISTA) 60 MG tablet Take 1 tablet (60 mg total) by mouth daily. 90 tablet 1   hydrochlorothiazide (HYDRODIURIL) 25 MG tablet Take 1 tablet (25 mg total) by mouth daily. (Patient not taking: Reported on 10/20/2023) 90 tablet 1   No facility-administered medications prior to visit.    Review of Systems;  Patient denies headache, fevers, malaise, unintentional weight loss, skin rash, eye pain, sinus congestion and sinus pain, sore  throat, dysphagia,  hemoptysis , cough, dyspnea, wheezing, chest pain, palpitations, orthopnea, edema, abdominal pain, nausea, melena, diarrhea, constipation, flank pain, dysuria, hematuria, urinary  Frequency, nocturia, numbness, tingling, seizures,  Focal weakness, Loss of consciousness,  Tremor, insomnia, depression, anxiety, and suicidal ideation.      Objective:  BP 126/71   Pulse 79   Ht 5\' 2"  (1.575 m)   Wt 133 lb (60.3 kg)   SpO2 96%   BMI 24.33 kg/m   BP Readings from Last 3 Encounters:  10/20/23 126/71  04/29/23 113/60  04/22/23 (!) 174/77    Wt Readings  from Last 3 Encounters:  10/20/23 133 lb (60.3 kg)  04/29/23 126 lb 9.6 oz (57.4 kg)  04/22/23 129 lb (58.5 kg)    Physical Exam Vitals reviewed.  Constitutional:      General: She is not in acute distress.    Appearance: Normal appearance. She is normal weight. She is not ill-appearing, toxic-appearing or diaphoretic.  HENT:     Head: Normocephalic.  Eyes:     General: No scleral icterus.       Right eye: No discharge.        Left eye: No discharge.     Conjunctiva/sclera: Conjunctivae normal.  Cardiovascular:     Rate and Rhythm: Normal rate and regular rhythm.     Heart sounds: Normal heart sounds.  Pulmonary:     Effort: Pulmonary effort is normal. No respiratory distress.     Breath sounds: Normal breath sounds.  Musculoskeletal:        General: Normal range of motion.  Skin:    General: Skin is warm and dry.  Neurological:     General: No focal deficit present.     Mental Status: She is alert and oriented to person, place, and time. Mental status is at baseline.  Psychiatric:        Mood and Affect: Mood normal.        Behavior: Behavior normal.        Thought Content: Thought content normal.        Judgment: Judgment normal.   Lab Results  Component Value Date   HGBA1C 5.6 10/20/2023   HGBA1C 5.4 02/08/2021   HGBA1C 5.5 06/20/2017    Lab Results  Component Value Date   CREATININE 0.73 10/20/2023   CREATININE 0.76 04/21/2023   CREATININE 0.77 10/18/2022    Lab Results  Component Value Date   WBC 4.8 04/21/2023   HGB 13.8 04/21/2023   HCT 42.1 04/21/2023   PLT 266.0 04/21/2023   GLUCOSE 89 10/20/2023   CHOL 140 10/20/2023   TRIG 78.0 10/20/2023   HDL 55.90 10/20/2023   LDLDIRECT 74.0 10/20/2023   LDLCALC 68 10/20/2023   ALT 14 10/20/2023   AST 20 10/20/2023   NA 142 10/20/2023   K 4.0 10/20/2023   CL 105 10/20/2023   CREATININE 0.73 10/20/2023   BUN 10 10/20/2023   CO2 29 10/20/2023   TSH 1.75 10/20/2023   HGBA1C 5.6 10/20/2023    MICROALBUR <0.7 10/20/2023    No results found.  Assessment & Plan:  .Encounter for screening mammogram for malignant neoplasm of breast -     3D Screening Mammogram, Left and Right; Future  Hypothyroidism due to acquired atrophy of thyroid Assessment & Plan: Feeling better since thyroid function has normalized on 75 mcg daily .  No changes today   Lab Results  Component Value Date   TSH 1.75 10/20/2023     Orders: -  TSH  Impaired fasting glucose Assessment & Plan: A1c is normal.   Lab Results  Component Value Date   HGBA1C 5.6 10/20/2023     Orders: -     Hemoglobin A1c  Hyperlipidemia LDL goal <100 -     Lipid panel -     LDL cholesterol, direct  Elevated blood pressure reading in office with white coat syndrome, with diagnosis of hypertension Assessment & Plan: Home readings are at goal on a calibrated machine.  Continue amlodipine  5 mg daily    Other osteoporosis without current pathological fracture Assessment & Plan: She has resumed  Evista  for T score -2.8 hip by 2024 DEXA    Tubular adenoma of colon Assessment & Plan: 3 year follow up was done Sept 2024 by Crystal Kidd. No polyps were found,  only tics and hemorrhoids.  No additional scopes advised de to age    Esophageal dysphagia Assessment & Plan: Subjective, with no evidence of stricture on last EGD and last  DG esophagus.  She declined to swallow the barium tablet. disordered peristalsis noted on swallow evaluation .    Persistent cough for 3 weeks or longer Assessment & Plan: Lung exam is clear.  Plain films ordered,  cough suppressant ordered and mucolytic advised.   Chest x ray ordered.  If normal will  Refer to pulmonology if no improvement in 4 week s  Orders: -     DG Chest 2 View; Future  Esophageal dysmotility Assessment & Plan: Noted on 2023 DG esophagus. No strictures seen.    Thoracic aortic atherosclerosis (HCC) Assessment & Plan: Managed with  simvastatin 20 mg daily.   She  has no side effects and liver enzymes are  due for repeat .  She is not fasting today   Lab Results  Component Value Date   ALT 14 04/21/2023   AST 21 04/21/2023   ALKPHOS 99 04/21/2023   BILITOT 0.7 04/21/2023      White coat syndrome with hypertension Assessment & Plan: Home readings are at goal on a calibrated machine.  Continue amlodipine  5 mg daily   Orders: -     Comprehensive metabolic panel -     Microalbumin / creatinine urine ratio  Other orders -     amLODIPine Besylate; Take 1 tablet (5 mg total) by mouth daily.  Dispense: 90 tablet; Refill: 3 -     Esomeprazole Magnesium; Take 1 capsule (40 mg total) by mouth daily.  Dispense: 90 capsule; Refill: 1 -     Raloxifene HCl; Take 1 tablet (60 mg total) by mouth daily.  Dispense: 90 tablet; Refill: 1 -     Benzonatate; Take 1 capsule (200 mg total) by mouth 3 (three) times daily as needed for cough.  Dispense: 60 capsule; Refill: 1     I spent 31 minutes on the day of this face to face encounter reviewing patient's  most recent visit with gastroenterology,    prior relevant surgical and non surgical procedures, recent  labs and imaging studies,  reviewing the assessment and plan with patient, and post visit ordering and reviewing of  diagnostics and therapeutics with patient  .   Follow-up: Return in about 6 months (around 04/21/2024) for physical.   Sherlene Shams, MD

## 2023-10-20 NOTE — Assessment & Plan Note (Signed)
 Noted on 2023 DG esophagus. No strictures seen.

## 2023-10-20 NOTE — Assessment & Plan Note (Signed)
 Lung exam is clear.  Plain films ordered,  cough suppressant ordered and mucolytic advised.   Chest x ray ordered.  If normal will  Refer to pulmonology if no improvement in 4 week s

## 2023-10-20 NOTE — Assessment & Plan Note (Signed)
 She has resumed  Evista  for T score -2.8 hip by 2024 DEXA

## 2023-10-20 NOTE — Patient Instructions (Signed)
 I am prescribing tessalon perles (benzonotate capsules) to use up to 3 times daily to SUPPRESS the cough  You can also use Mucinex DM  with this medication to completely suppress the cough and thin out the mucus  Continue claritin twice daily and nexium/famotidine as you are doing   Pulmonology referral if no better in  4 weeks

## 2023-10-20 NOTE — Assessment & Plan Note (Addendum)
 Home readings are at goal on a calibrated machine.. however screening urinalysis is positive for alb/cr ratio of  > 30. Changing to ARB

## 2023-10-20 NOTE — Assessment & Plan Note (Addendum)
 Feeling better since thyroid function has normalized on 75 mcg daily .  No changes today   Lab Results  Component Value Date   TSH 1.75 10/20/2023

## 2023-10-20 NOTE — Assessment & Plan Note (Addendum)
 3 year follow up was done Sept 2024 by Servando Snare. No polyps were found,  only tics and hemorrhoids.  No additional scopes advised de to age

## 2023-10-20 NOTE — Assessment & Plan Note (Signed)
 Managed with  simvastatin 20 mg daily.  She  has no side effects and liver enzymes are  due for repeat .  She is not fasting today   Lab Results  Component Value Date   ALT 14 04/21/2023   AST 21 04/21/2023   ALKPHOS 99 04/21/2023   BILITOT 0.7 04/21/2023

## 2023-10-20 NOTE — Assessment & Plan Note (Signed)
 A1c is normal.   Lab Results  Component Value Date   HGBA1C 5.6 10/20/2023

## 2023-10-21 ENCOUNTER — Encounter: Payer: Self-pay | Admitting: Internal Medicine

## 2023-10-21 MED ORDER — TELMISARTAN 40 MG PO TABS: 40.0000 mg | ORAL_TABLET | Freq: Every day | ORAL | 0 refills | Status: DC

## 2023-10-21 NOTE — Addendum Note (Signed)
 Addended by: Sherlene Shams on: 10/21/2023 10:27 PM   Modules accepted: Orders

## 2023-10-23 DIAGNOSIS — L820 Inflamed seborrheic keratosis: Secondary | ICD-10-CM | POA: Diagnosis not present

## 2023-10-28 ENCOUNTER — Ambulatory Visit: Payer: Self-pay | Admitting: Internal Medicine

## 2023-10-28 NOTE — Telephone Encounter (Signed)
 Copied from CRM [id number]. Topic: Clinical - Medication Question >> Oct 28, 2023  2:37 PM Denese Killings wrote: Reason for CRM: Patient is requesting a callback from the nurse regarding blood pressure medications, Patient is unsure on which one she should be taking. Blood pressure has been high for the past couple of days. It is 126/70 today. telmisartan (MICARDIS) 40 MG tablet amLODipine (NORVASC) 5 MG tablet

## 2023-10-29 ENCOUNTER — Encounter: Payer: Self-pay | Admitting: Internal Medicine

## 2023-10-29 NOTE — Telephone Encounter (Signed)
 Fyi. Pt also informed about the bp meds she should be on as well as xray results

## 2023-11-04 ENCOUNTER — Other Ambulatory Visit (INDEPENDENT_AMBULATORY_CARE_PROVIDER_SITE_OTHER)

## 2023-11-04 ENCOUNTER — Other Ambulatory Visit

## 2023-11-04 DIAGNOSIS — I10 Essential (primary) hypertension: Secondary | ICD-10-CM

## 2023-11-04 LAB — BASIC METABOLIC PANEL
BUN: 12 mg/dL (ref 6–23)
CO2: 29 meq/L (ref 19–32)
Calcium: 9.1 mg/dL (ref 8.4–10.5)
Chloride: 105 meq/L (ref 96–112)
Creatinine, Ser: 0.81 mg/dL (ref 0.40–1.20)
GFR: 69.55 mL/min (ref >60.00)
Glucose, Bld: 93 mg/dL (ref 70–99)
Potassium: 4 meq/L (ref 3.5–5.1)
Sodium: 139 meq/L (ref 135–145)

## 2023-11-05 ENCOUNTER — Encounter: Payer: Self-pay | Admitting: Internal Medicine

## 2023-11-05 DIAGNOSIS — J189 Pneumonia, unspecified organism: Secondary | ICD-10-CM | POA: Insufficient documentation

## 2023-11-05 MED ORDER — AZITHROMYCIN 500 MG PO TABS: 500.0000 mg | ORAL_TABLET | Freq: Every day | ORAL | 0 refills | Status: DC

## 2023-11-05 MED ORDER — AMOXICILLIN-POT CLAVULANATE 875-125 MG PO TABS: 1.0000 | ORAL_TABLET | Freq: Two times a day (BID) | ORAL | 0 refills | Status: DC

## 2023-11-05 NOTE — Assessment & Plan Note (Signed)
 Bilateral LL infiltrates noted; cough has persisted . Treating with augmentin and azithromycin

## 2023-11-05 NOTE — Addendum Note (Signed)
 Addended by: Sherlene Shams on: 11/05/2023 09:20 PM   Modules accepted: Orders

## 2023-11-12 ENCOUNTER — Ambulatory Visit (INDEPENDENT_AMBULATORY_CARE_PROVIDER_SITE_OTHER): Admitting: Internal Medicine

## 2023-11-12 ENCOUNTER — Ambulatory Visit: Payer: Self-pay

## 2023-11-12 ENCOUNTER — Encounter: Payer: Self-pay | Admitting: Internal Medicine

## 2023-11-12 VITALS — BP 144/82 | HR 82 | Temp 98.0°F | Ht 62.0 in | Wt 132.0 lb

## 2023-11-12 DIAGNOSIS — R197 Diarrhea, unspecified: Secondary | ICD-10-CM | POA: Diagnosis not present

## 2023-11-12 DIAGNOSIS — J189 Pneumonia, unspecified organism: Secondary | ICD-10-CM | POA: Diagnosis not present

## 2023-11-12 DIAGNOSIS — I10 Essential (primary) hypertension: Secondary | ICD-10-CM | POA: Diagnosis not present

## 2023-11-12 NOTE — Assessment & Plan Note (Signed)
-  Patient was recently diagnosed with community-acquired pneumonia and was started on Augmentin and azithromycin -Patient still complains of some productive cough at night but overall feels much better -Patient was instructed to complete her course of antibiotics  -Patient can continue with Tessalon as needed for her cough -I explained to the patient that the cough will likely persist for a couple weeks and then should resolve -No further workup required at this time.  Patient may benefit from repeat chest x-ray after 4-6 weeks to ensure resolution of infiltrates.

## 2023-11-12 NOTE — Telephone Encounter (Signed)
 Spoke with pt and scheduled her for an appt this afternoon with Dr. Heide Spark.

## 2023-11-12 NOTE — Telephone Encounter (Signed)
 Copied From CRM [id number]. Reason for Triage: Patient is calling because she has diarrhea and her blood pressure is high   Chief Complaint: HTN, cough Symptoms: loose, watery stools, tired, BP this morning 177/76: @0925  164/84,    HR@ 97,  @0927  161/77 HR@92 , cough Frequency: diarrhea possible from ABX Pertinent Negatives: Patient denies fever Disposition: [] ED /[] Urgent Care (no appt availability in office) / [] Appointment(In office/virtual)/ []  [facility]/ [] Home Care/ [] Refused Recommended Disposition /[] [facility]/ [x]  Follow-up with PCP Additional Notes: LOV pt dx with pneumonia - taking ABX & prescribed cough medication: pt states still coughing up stuff & wants to know if she needs to continue with cough medication or stop.  LOV given new BP meds: now BP readings are higher than usual:  130, 135, 140 150 160.  Pt states she feels like her BP is elevated.  Pt having diarrhea: wants to know if it related to ABX.  Reason for Disposition  Cough  [1] Systolic BP  < 130 with Diastolic < 80 AND [2] taking BP medications    Pt started on new BP medication: BP higher than normal: will route to PCP to determine plan of care  Answer Assessment - Initial Assessment Questions 1. BLOOD PRESSURE: "What is the blood pressure?" "Did you take at least two measurements 5 minutes apart?"     126/70 today then 176/77 @ [id number]   @0927  HR@97   161/77 @92  2. ONSET: "When did you take your blood pressure?"     X last few days 3. HOW: "How did you take your blood pressure?" (e.g., automatic home BP monitor, visiting nurse)     Automatic BP  4. HISTORY: "Do you have a history of high blood pressure?"     yes 5. MEDICINES: "Are you taking any medicines for blood pressure?" "Have you missed any doses recently?"     Started new blood pressure medication and BP has been elevated 6. OTHER SYMPTOMS: "Do you have any symptoms?" (e.g., blurred vision, chest pain, difficulty breathing,  headache, weakness)     For 2-3 days room going around when laying and went away, tired, 7. PREGNANCY: "Is there any chance you are pregnant?" "When was your last menstrual period?"     N/a  Answer Assessment - Initial Assessment Questions 1. ONSET: "When did the cough begin?"      X 3 weeks 2. SEVERITY: "How bad is the cough today?"      Mild to moderate 3. SPUTUM: "Describe the color of your sputum" (none, dry cough; clear, white, yellow, green)     Yes having sputum  4. HEMOPTYSIS: "Are you coughing up any blood?" If so ask: "How much?" (flecks, streaks, tablespoons, etc.)     no 5. DIFFICULTY BREATHING: "Are you having difficulty breathing?" If Yes, ask: "How bad is it?" (e.g., mild, moderate, severe)    - MILD: No SOB at rest, mild SOB with walking, speaks normally in sentences, can lie down, no retractions, pulse < 100.    - MODERATE: SOB at rest, SOB with minimal exertion and prefers to sit, cannot lie down flat, speaks in phrases, mild retractions, audible wheezing, pulse 100-120.    - SEVERE: Very SOB at rest, speaks in single words, struggling to breathe, sitting hunched forward, retractions, pulse > 120      Mild - with exertion 6. FEVER: "Do you have a fever?" If Yes, ask: "What is your temperature, how was it measured, and when did it start?"  no 7. CARDIAC HISTORY: "Do you have any history of heart disease?" (e.g., heart attack, congestive heart failure)      N/a 8. LUNG HISTORY: "Do you have any history of lung disease?"  (e.g., pulmonary embolus, asthma, emphysema)     N/a 9. PE RISK FACTORS: "Do you have a history of blood clots?" (or: recent major surgery, recent prolonged travel, bedridden)     N/a 10. OTHER SYMPTOMS: "Do you have any other symptoms?" (e.g., runny nose, wheezing, chest pain)       no 11. PREGNANCY: "Is there any chance you are pregnant?" "When was your last menstrual period?"       N/a 12. TRAVEL: "Have you traveled out of the country in the last  month?" (e.g., travel history, exposures)       N/a  Protocols used: Blood Pressure - High-A-AH, Cough - Acute Productive-A-AH

## 2023-11-12 NOTE — Patient Instructions (Addendum)
-  It was a pleasure meeting you today -I suspect that your diarrhea is likely secondary to the antibiotic that you are on and to get better in the next couple of days.   -However, I want to rule out an underlying infection called C. difficile.  We will obtain a stool sample to rule this out -You can continue with your cough medications for now.  I suspect that your cough should get better in the next couple weeks. -I do not want to change your blood pressure medications currently given that you have ongoing diarrhea and this could cause dehydration and low blood pressures.  If the blood pressure remains elevated after your diarrhea improves please follow-up with [place] for further evaluation.

## 2023-11-12 NOTE — Assessment & Plan Note (Signed)
-  Patient states she has had elevated systolic blood pressures at home in the 160s to 170s -Systolic blood pressure here today was in the 140s -Given that she has ongoing diarrhea currently I do not want to add on an additional antihypertensives at this time -Will continue telmisartan 40 mg daily for now -Patient discontinued amlodipine at her last visit -If patient's blood pressure remains elevated once she improves patient to follow-up with [place] and we will consider adding an additional medication

## 2023-11-12 NOTE — Progress Notes (Signed)
 Acute Office Visit  Subjective:     Patient ID: Crystal Kidd, female    DOB: 05-02-1946, 77 y.o.   MRN: [id number]  Chief Complaint  Patient presents with   Acute Visit    Diarrhea, productive cough and Elevated BP 8th day of Antibiotics for Pneumonia    HPI Patient is in today for diarrhea beginning yesterday.  Patient states that yesterday she had 3 episodes of diarrhea which was mainly watery.  No melena or hematochezia noted.  Patient states that the last bowel movement she had yesterday was slightly more solid.  Patient did have 1 more episode of diarrhea this morning.  Patient states that she has been on antibiotics (Augmentin and azithromycin) for recently diagnosed pneumonia.  She denies any fevers or chills.  States that she did have some mild lower abdominal pain yesterday prior to going to the bathroom but this resolved.    Patient also complains of persistent cough mainly at night secondary to her pneumonia.  She states that this is improving and she is feeling much better  Patient also has noted increase in her blood pressures at home with systolics up to the 160s to 170s.  She has recently started telmisartan and stopped her amlodipine.  Systolic blood pressure was in the 140s today in the clinic.  Review of Systems  Constitutional: Negative.   HENT: Negative.    Respiratory:  Positive for cough and sputum production. Negative for hemoptysis, shortness of breath and wheezing.   Cardiovascular: Negative.   Gastrointestinal:  Positive for abdominal pain and diarrhea. Negative for nausea and vomiting.  Neurological: Negative.   Psychiatric/Behavioral: Negative.          Objective:    BP (!) 144/82   Pulse 82   Temp 98 F (36.7 C)   Ht 5\' 2"  (1.575 m)   Wt 132 lb (59.9 kg)   SpO2 98%   BMI 24.14 kg/m    Physical Exam Constitutional:      Appearance: Normal appearance.  HENT:     Head: Normocephalic and atraumatic.  Cardiovascular:     Rate and Rhythm:  Normal rate.     Heart sounds: Normal heart sounds.  Pulmonary:     Breath sounds: Normal breath sounds. No wheezing or rales.  Abdominal:     General: Bowel sounds are normal. There is no distension.     Palpations: Abdomen is soft.     Tenderness: There is no abdominal tenderness. There is no guarding or rebound.  Neurological:     Mental Status: She is alert and oriented to person, place, and time.  Psychiatric:        Mood and Affect: Mood normal.     No results found for any visits on 11/12/23.      Assessment & Plan:   Problem List Items Addressed This Visit       Cardiovascular and Mediastinum   White coat syndrome with hypertension   -Patient states she has had elevated systolic blood pressures at home in the 160s to 170s -Systolic blood pressure here today was in the 140s -Given that she has ongoing diarrhea currently I do not want to add on an additional antihypertensives at this time -Will continue telmisartan 40 mg daily for now -Patient discontinued amlodipine at her last visit -If patient's blood pressure remains elevated once she improves patient to follow-up with [place] and we will consider adding an additional medication        Respiratory  Community acquired pneumonia   -Patient was recently diagnosed with community-acquired pneumonia and was started on Augmentin and azithromycin -Patient still complains of some productive cough at night but overall feels much better -Patient was instructed to complete her course of antibiotics  -Patient can continue with Tessalon as needed for her cough -I explained to the patient that the cough will likely persist for a couple weeks and then should resolve -No further workup required at this time.  Patient may benefit from repeat chest x-ray after 4-6 weeks to ensure resolution of infiltrates.        Other   Diarrhea - Primary   -Patient presented today with diarrhea beginning yesterday.  Patient has 3 episodes  yesterday with some abdominal pain which resolved after defecation.  Patient also had an additional episode today.  No hematochezia or melena noted. -She has been on antibiotics (Augmentin and completed azithromycin) for recently diagnosed pneumonia -I suspect this is likely antibiotic associated diarrhea -However, given that she will start on Augmentin there is concern for possible C. Difficile -We will obtain a stool for C. difficile to rule this out -Return precautions given to the patient -No further workup at this time      Relevant Orders   C Difficile Quick Screen w PCR reflex    No orders of the defined types were placed in this encounter.   No follow-ups on file.  Earl Lagos, MD

## 2023-11-12 NOTE — Assessment & Plan Note (Signed)
-  Patient presented today with diarrhea beginning yesterday.  Patient has 3 episodes yesterday with some abdominal pain which resolved after defecation.  Patient also had an additional episode today.  No hematochezia or melena noted. -She has been on antibiotics (Augmentin and completed azithromycin) for recently diagnosed pneumonia -I suspect this is likely antibiotic associated diarrhea -However, given that she will start on Augmentin there is concern for possible C. Difficile -We will obtain a stool for C. difficile to rule this out -Return precautions given to the patient -No further workup at this time

## 2023-11-13 ENCOUNTER — Other Ambulatory Visit: Payer: Self-pay | Admitting: Internal Medicine

## 2023-11-13 DIAGNOSIS — L814 Other melanin hyperpigmentation: Secondary | ICD-10-CM | POA: Diagnosis not present

## 2023-11-13 DIAGNOSIS — D1801 Hemangioma of skin and subcutaneous tissue: Secondary | ICD-10-CM | POA: Diagnosis not present

## 2023-11-13 DIAGNOSIS — L821 Other seborrheic keratosis: Secondary | ICD-10-CM | POA: Diagnosis not present

## 2023-11-13 DIAGNOSIS — L80 Vitiligo: Secondary | ICD-10-CM | POA: Diagnosis not present

## 2023-11-13 DIAGNOSIS — D225 Melanocytic nevi of trunk: Secondary | ICD-10-CM | POA: Diagnosis not present

## 2023-11-14 ENCOUNTER — Other Ambulatory Visit
Admission: RE | Admit: 2023-11-14 | Discharge: 2023-11-14 | Disposition: A | Discharge status: Home / Self Care | Source: Ambulatory Visit | Attending: Internal Medicine | Admitting: Internal Medicine

## 2023-11-14 DIAGNOSIS — R197 Diarrhea, unspecified: Secondary | ICD-10-CM | POA: Insufficient documentation

## 2023-11-14 LAB — C DIFFICILE QUICK SCREEN W PCR REFLEX
C Diff antigen: NEGATIVE
C Diff interpretation: NOT DETECTED
C Diff toxin: NEGATIVE

## 2023-11-17 ENCOUNTER — Encounter: Payer: Self-pay | Admitting: Family

## 2023-12-22 ENCOUNTER — Encounter (HOSPITAL_COMMUNITY): Payer: Self-pay

## 2023-12-29 ENCOUNTER — Ambulatory Visit: Payer: Medicare HMO | Admitting: *Deleted

## 2023-12-29 ENCOUNTER — Telehealth: Payer: Self-pay | Admitting: *Deleted

## 2023-12-29 VITALS — BP 137/70 | HR 67 | Ht 62.0 in | Wt 132.0 lb

## 2023-12-29 DIAGNOSIS — Z0000 Encounter for general adult medical examination without abnormal findings: Secondary | ICD-10-CM

## 2023-12-29 NOTE — Progress Notes (Signed)
 Subjective:   Crystal Kidd is a 78 y.o. who presents for a Medicare Wellness preventive visit.  As a reminder, Annual Wellness Visits don't include a physical exam, and some assessments may be limited, especially if this visit is performed virtually. We may recommend an in-person follow-up visit with your provider if needed.  Visit Complete: Virtual I connected with  [name] on 12/29/23 by a audio enabled telemedicine application and verified that I am speaking with the correct person using two identifiers.  Patient Location: Home  Provider Location: Home Office  I discussed the limitations of evaluation and management by telemedicine. The patient expressed understanding and agreed to proceed.  Vital Signs: Because this visit was a virtual/telehealth visit, some criteria may be missing or patient reported. Any vitals not documented were not able to be obtained and vitals that have been documented are patient reported.  VideoDeclined- This patient declined Librarian, academic. Therefore the visit was completed with audio only.  Persons Participating in Visit: Patient.  AWV Questionnaire: Yes Patient Medicare AWV Questionnaire was completed by the patient on 12/24/23.. I have confirmed that all information answered by the patient is correct since this date  Cardiac Risk Factors include: advanced age (>33men, >34 women);hypertension;dyslipidemia     Objective:     Today's Vitals   12/29/23 0941 12/29/23 0944  BP: (!) 159/72 137/70  Pulse: 67   Weight: 132 lb (59.9 kg)   Height: 5\' 2"  (1.575 m)    Body mass index is 24.14 kg/m.     12/29/2023   10:02 AM 04/29/2023    7:02 AM 12/27/2022    9:28 AM 12/19/2021   10:36 AM 12/14/2020    2:47 PM 12/14/2019    9:51 AM 10/19/2019    4:00 AM  Advanced Directives  Does Patient Have a Medical Advance Directive? Yes No Yes Yes Yes No Yes  Type of Estate agent of [place];Living will   Healthcare Power of [place];Living will Healthcare Power of [place];Living will Healthcare Power of [place];Living will  Healthcare Power of Attorney  Does patient want to make changes to medical advance directive? No - Patient declined  No - Patient declined No - Patient declined No - Patient declined Yes (MAU/Ambulatory/Procedural Areas - Information given) No - Patient declined  Copy of Healthcare Power of Attorney in Chart? Yes - validated most recent copy scanned in chart (See row information)  No - copy requested No - copy requested No - copy requested  No - copy requested  Would patient like information on creating a medical advance directive?       No - Patient declined    Current Medications (verified) Outpatient Encounter Medications as of 12/29/2023  Medication Sig   Alum Hydroxide-Mag Carbonate (GAVISCON PO) Take 1 capsule by mouth daily.   benzonatate  (TESSALON ) 200 MG capsule Take 1 capsule (200 mg total) by mouth 3 (three) times daily as needed for cough.   esomeprazole  (NEXIUM ) 40 MG capsule Take 1 capsule (40 mg total) by mouth daily.   famotidine  (PEPCID ) 20 MG tablet TAKE 1 TABLET (20 MG TOTAL) BY MOUTH DAILY PRIOR TO DINNER   gabapentin  (NEURONTIN ) 100 MG capsule TAKE 1 CAPSULE (100 MG TOTAL) BY MOUTH 3 (THREE) TIMES DAILY. EVERY 8 HOURS AS NEEDED   Inulin (FIBER CHOICE PO) Take 1 tablet by mouth daily as needed.   levothyroxine  (SYNTHROID ) 75 MCG tablet TAKE 1 TABLET EVERY DAY BEFORE BREAKFAST   loratadine  (CLARITIN ) 10  MG tablet Take 10 mg by mouth in the morning and at bedtime.   ondansetron  (ZOFRAN -ODT) 4 MG disintegrating tablet TAKE 1 TABLET BY MOUTH EVERY 8 HOURS AS NEEDED FOR NAUSEA AND VOMITING   Peppermint Oil (IBGARD PO) Take by mouth.   Polyethyl Glycol-Propyl Glycol (SYSTANE OP) Apply to eye. As needed   raloxifene  (EVISTA ) 60 MG tablet Take 1 tablet (60 mg total) by mouth daily.   simvastatin  (ZOCOR ) 20 MG tablet TAKE 1 TABLET EVERY EVENING   telmisartan   (MICARDIS ) 40 MG tablet TAKE 1 TABLET BY MOUTH EVERY DAY   amLODipine  (NORVASC ) 5 MG tablet Take 1 tablet (5 mg total) by mouth daily. (Patient not taking: Reported on 12/29/2023)   Calcium -Phosphorus-Vitamin D  (CALCIUM  GUMMIES PO) Take by mouth. (Patient not taking: Reported on 12/29/2023)   carboxymethylcellul-glycerin (REFRESH OPTIVE) 0.5-0.9 % ophthalmic solution Place 1 drop into both eyes as needed for dry eyes. (Patient not taking: Reported on 12/29/2023)   cholecalciferol (VITAMIN D3) 25 MCG (1000 UNIT) tablet Take 1,000 Units by mouth daily. (Patient not taking: Reported on 12/29/2023)   [DISCONTINUED] amoxicillin -clavulanate (AUGMENTIN ) 875-125 MG tablet Take 1 tablet by mouth 2 (two) times daily. (Patient not taking: Reported on 12/29/2023)   [DISCONTINUED] azithromycin  (ZITHROMAX ) 500 MG tablet Take 1 tablet (500 mg total) by mouth daily. (Patient not taking: Reported on 12/29/2023)   No facility-administered encounter medications on file as of 12/29/2023.    Allergies (verified) Besivance [besifloxacin hcl], Polymyxin b, Alendronate sodium, Dilaudid [hydromorphone hcl], Codeine, Flonase  [fluticasone ], and Ibandronate sodium   History: Past Medical History:  Diagnosis Date   Allergic rhinoconjunctivitis    Allergy 1992   Arthritis    Cataract    Complication of anesthesia    Diverticulosis    Dizziness 04/21/2023   Eosinophilic esophagitis    GERD (gastroesophageal reflux disease)    PMH of esophageal stricture   Hyperlipidemia    Hypertension    Osteopenia    BMD @[facility]    PONV (postoperative nausea and vomiting)    Thyroid  disease    hypothyroidism   Transfusion history 1968   post partum   Trapezius muscle spasm 04/13/2015   Vitiligo    Past Surgical History:  Procedure Laterality Date   ABDOMINAL HYSTERECTOMY     for dysfunctional menses; USO with TAH   APPENDECTOMY     BREAST EXCISIONAL BIOPSY Right 1972   NEG   BREAST EXCISIONAL BIOPSY Left 1980'S   NEG    BREAST SURGERY     biopsy X 2   CHOLECYSTECTOMY N/A 04/30/2016   Procedure: LAPAROSCOPIC CHOLECYSTECTOMY;  Surgeon: Dorena Gander, MD;  Location: [facility] OR;  Service: General;  Laterality: N/A;   COLONOSCOPY     X2; Tics; Dr Sandrea Cruel, GI   COLONOSCOPY     COLONOSCOPY WITH PROPOFOL  N/A 04/29/2023   Procedure: COLONOSCOPY WITH PROPOFOL ;  Surgeon: Marnee Sink, MD;  Location: [facility] ENDOSCOPY;  Service: Endoscopy;  Laterality: N/A;   ESOPHAGEAL DILATION     X2   ESOPHAGOGASTRODUODENOSCOPY (EGD) WITH PROPOFOL  N/A 10/19/2019   Procedure: ESOPHAGOGASTRODUODENOSCOPY (EGD) WITH PROPOFOL ;  Surgeon: Sergio Dandy, MD;  Location: [facility] ENDOSCOPY;  Service: Endoscopy;  Laterality: N/A;   EYE SURGERY     cataracts removed   FRACTURE SURGERY  2003or 2004   ROTATOR CUFF REPAIR     R shoulder   TUBAL LIGATION     befor hysterectomy   UPPER GASTROINTESTINAL ENDOSCOPY     Family History  Problem Relation Age of Onset   Stomach  cancer Mother    Breast cancer Mother    Emphysema Father    Breast cancer Sister        mammograms @ [facility]   Colon polyps Sister    Coronary artery disease Brother    COPD Brother    Colon polyps Brother    Liver disease Brother    Colon cancer Maternal Aunt    Cancer Maternal Aunt 40       colon cA   Diabetes Neg Hx    Stroke Neg Hx    Heart disease Neg Hx    Esophageal cancer Neg Hx    Rectal cancer Neg Hx    Social History   Socioeconomic History   Marital status: Widowed    Spouse name: Not on file   Number of children: 2   Years of education: Not on file   Highest education level: 12th grade  Occupational History   Occupation: Retired  Tobacco Use   Smoking status: Never   Smokeless tobacco: Never  Vaping Use   Vaping status: Never Used  Substance and Sexual Activity   Alcohol  use: Not Currently    Comment: have wine 1 glass every 6 months   Drug use: No   Sexual activity: Not Currently  Other Topics Concern   Not on file  Social History Narrative    Not on file   Social Drivers of Health   Financial Resource Strain: Low Risk  (12/24/2023)   Overall Financial Resource Strain (CARDIA)    Difficulty of Paying Living Expenses: Not hard at all  Food Insecurity: No Food Insecurity (12/24/2023)   Hunger Vital Sign    Worried About Running Out of Food in the Last Year: Never true    Ran Out of Food in the Last Year: Never true  Transportation Needs: No Transportation Needs (12/24/2023)   PRAPARE - Administrator, Civil Service (Medical): No    Lack of Transportation (Non-Medical): No  Physical Activity: Sufficiently Active (12/24/2023)   Exercise Vital Sign    Days of Exercise per Week: 4 days    Minutes of Exercise per Session: 50 min  Stress: No Stress Concern Present (12/24/2023)   [employer] of Occupational Health - Occupational Stress Questionnaire    Feeling of Stress : Not at all  Social Connections: Moderately Isolated (12/24/2023)   Social Connection and Isolation Panel [NHANES]    Frequency of Communication with Friends and Family: More than three times a week    Frequency of Social Gatherings with Friends and Family: Three times a week    Attends Religious Services: More than 4 times per year    Active Member of Clubs or Organizations: No    Attends Banker Meetings: Not on file    Marital Status: Widowed    Tobacco Counseling Counseling given: Not Answered    Clinical Intake:  Pre-visit preparation completed: Yes  Pain : No/denies pain     BMI - recorded: 24.14 Nutritional Status: BMI of 19-24  Normal Nutritional Risks: None Diabetes: No  Lab Results  Component Value Date   HGBA1C 5.6 10/20/2023   HGBA1C 5.4 02/08/2021   HGBA1C 5.5 06/20/2017     How often do you need to have someone help you when you read instructions, pamphlets, or other written materials from your doctor or pharmacy?: 1 - Never  Interpreter Needed?: No  Information entered by :: R. Olanda Downie  LPN   Activities of Daily Living  12/29/2023    9:45 AM  In your present state of health, do you have any difficulty performing the following activities:  Hearing? 1  Comment wears aids  Vision? 0  Comment readers  Difficulty concentrating or making decisions? 0  Walking or climbing stairs? 0  Dressing or bathing? 0  Doing errands, shopping? 0  Preparing Food and eating ? N  Using the Toilet? N  In the past six months, have you accidently leaked urine? N  Do you have problems with loss of bowel control? N  Managing your Medications? N  Managing your Finances? N  Housekeeping or managing your Housekeeping? N    Patient Care Team: Thersia Flax, MD as PCP - General (Internal Medicine)  Indicate any recent Medical Services you may have received from other than [facility] providers in the past year (date may be approximate).     Assessment:    This is a routine wellness examination for Tiffany.  Hearing/Vision screen Hearing Screening - Comments:: Wears aids Vision Screening - Comments:: readers   Goals Addressed             This Visit's Progress    Patient Stated       Wants to continue to do all of her housework, lawn work and stay active       Depression Screen     12/29/2023    9:56 AM 11/12/2023    1:49 PM 10/20/2023    9:35 AM 04/21/2023    8:20 AM 12/27/2022    9:16 AM 10/18/2022    8:17 AM 04/17/2022    3:25 PM  PHQ 2/9 Scores  PHQ - 2 Score 0 0 0 0 0 0 0  PHQ- 9 Score 0 0  0       Fall Risk     12/29/2023    9:48 AM 11/12/2023    1:49 PM 10/20/2023    9:35 AM 04/21/2023    8:20 AM 12/27/2022    9:17 AM  Fall Risk   Falls in the past year? 0 0 0 0 0  Number falls in past yr: 0 0 0 0 0  Injury with Fall? 0 0 0 0 0  Risk for fall due to : No Fall Risks No Fall Risks No Fall Risks No Fall Risks No Fall Risks  Follow up Falls prevention discussed;Falls evaluation completed Falls evaluation completed Falls evaluation completed Falls evaluation completed Falls  evaluation completed    MEDICARE RISK AT HOME:  Medicare Risk at Home Any stairs in or around the home?: Yes If so, are there any without handrails?: No Home free of loose throw rugs in walkways, pet beds, electrical cords, etc?: Yes Adequate lighting in your home to reduce risk of falls?: Yes Life alert?: No Use of a cane, walker or w/c?: No Grab bars in the bathroom?: Yes Shower chair or bench in shower?: Yes Elevated toilet seat or a handicapped toilet?: Yes  TIMED UP AND GO:  Was the test performed?  No  Cognitive Function: 6CIT completed    12/14/2019    9:57 AM 12/01/2017   12:09 PM 11/29/2016   11:24 AM  MMSE - Mini Mental State Exam  Not completed: Unable to complete    Orientation to time  5 5  Orientation to Place  5 5  Registration  3 3  Attention/ Calculation  5 5  Recall  3 3  Language- name 2 objects  2 2  Language- repeat  1  1  Language- follow 3 step command  3 3  Language- read & follow direction  1 1  Write a sentence  1 1  Copy design  1 1  Total score  30 30        12/29/2023   10:02 AM 12/27/2022    9:25 AM 12/14/2020    3:08 PM 12/09/2018    1:43 PM  6CIT Screen  What Year? 0 points 0 points 0 points 0 points  What month? 0 points 0 points 0 points 0 points  What time? 0 points 0 points 0 points 0 points  Count back from 20 0 points 0 points  0 points  Months in reverse 0 points 0 points  0 points  Repeat phrase 0 points 0 points  0 points  Total Score 0 points 0 points  0 points    Immunizations Immunization History  Administered Date(s) Administered   Fluad Quad(high Dose 65+) 04/26/2019, 06/02/2020, 05/16/2021, 05/21/2022   Fluad Trivalent(High Dose 65+) 05/06/2023   H1N1 08/31/2008   Influenza Split 05/22/2011, 04/29/2012   Influenza Whole 08/12/2002, 05/05/2008, 05/16/2009, 05/21/2010   Influenza, High Dose Seasonal PF 04/12/2013, 04/09/2016, 04/10/2017, 05/25/2018   Influenza,inj,Quad PF,6+ Mos 04/20/2014, 05/02/2015    PFIZER(Purple Top)SARS-COV-2 Vaccination 09/01/2019, 09/20/2019, 06/23/2020   Pfizer Covid-19 Vaccine Bivalent Booster 69yrs & up 06/15/2021   Pfizer(Comirnaty)Fall Seasonal Vaccine 12 years and older 07/15/2022, 05/05/2023   Pneumococcal Conjugate-13 06/13/2014   Pneumococcal Polysaccharide-23 06/21/2010, 11/30/2015   Td 04/13/2004   Tdap 05/11/2014   Zoster Recombinant(Shingrix) 06/30/2019, 12/28/2019   Zoster, Live 07/26/2009    Screening Tests Health Maintenance  Topic Date Due   COVID-19 Vaccine (7 - 2024-25 season) 11/02/2023   Medicare Annual Wellness (AWV)  12/27/2023   MAMMOGRAM  01/23/2024   INFLUENZA VACCINE  03/12/2024   DTaP/Tdap/Td (3 - Td or Tdap) 05/11/2024   Colonoscopy  04/28/2026   Pneumonia Vaccine 29+ Years old  Completed   DEXA SCAN  Completed   Hepatitis C Screening  Completed   Zoster Vaccines- Shingrix  Completed   HPV VACCINES  Aged Out   Meningococcal B Vaccine  Aged Out    Health Maintenance  Health Maintenance Due  Topic Date Due   COVID-19 Vaccine (7 - 2024-25 season) 11/02/2023   Medicare Annual Wellness (AWV)  12/27/2023   Health Maintenance Items Addressed: See Nurse Notes  Additional Screening:  Vision Screening: Recommended annual ophthalmology exams for early detection of glaucoma and other disorders of the eye.Up to date [facility]  Dental Screening: Recommended annual dental exams for proper oral hygiene  Community Resource Referral / Chronic Care Management: CRR required this visit?  No   CCM required this visit?  No   Plan:    I have personally reviewed and noted the following in the patient's chart:   Medical and social history Use of alcohol , tobacco or illicit drugs  Current medications and supplements including opioid prescriptions. Patient is not currently taking opioid prescriptions. Functional ability and status Nutritional status Physical activity Advanced directives List of other  physicians Hospitalizations, surgeries, and ER visits in previous 12 months Vitals Screenings to include cognitive, depression, and falls Referrals and appointments  In addition, I have reviewed and discussed with patient certain preventive protocols, quality metrics, and best practice recommendations. A written personalized care plan for preventive services as well as general preventive health recommendations were provided to patient.   Felicitas Horse, LPN   1/61/0960   After Visit Summary: (MyChart) Due to  this being a telephonic visit, the after visit summary with patients personalized plan was offered to patient via MyChart   Notes: Nothing significant to report at this time.Phone note sent to PCP regarding blood pressure.

## 2023-12-29 NOTE — Telephone Encounter (Signed)
 Performed AWV today. Please see visit notes. Patient's stated that she took her blood pressure shortly after she got up this morning and it was 159/72 pulse 67. Patient stated that she had taken her blood pressure medication about 15 minutes earlier. Patient stated about 20 minutes after taking her medication she took it again and it was 137/70. Patient stated that her blood pressure medication was recently changed. Patient stated that she is monitoring her blood pressure on a regular basis and it is up and down. Patient stated in the past month 4 times it has been 150-159 so she is a little concerned. Patient wants to know if she needs to do anything differently.  Patient denies any symptoms that would be related to her blood pressure.

## 2023-12-29 NOTE — Telephone Encounter (Signed)
 Spoke with pt and advised her to check her bp once a day at various times of the day. I also scheduled pt for for 01/12/2024 there was not an appointment available in one week.

## 2023-12-29 NOTE — Telephone Encounter (Signed)
 Copied from CRM [id number]. Topic: General - Other >> Dec 29, 2023  3:27 PM Juleen Oakland F wrote: Reason for CRM: Patient returning Jessica's phone call, she wants to know how many times a day should she be logging her blood pressure? Please call her back at  [phone] (H)

## 2023-12-29 NOTE — Patient Instructions (Signed)
 Crystal Kidd , Thank you for taking time out of your busy schedule to complete your Annual Wellness Visit with me. I enjoyed our conversation and look forward to speaking with you again next year. I, as well as your care team,  appreciate your ongoing commitment to your health goals. Please review the following plan we discussed and let me know if I can assist you in the future. Your Game plan/ To Do List    Referrals: If you haven't heard from the office you've been referred to, please reach out to them at the phone provided.  None Follow up Visits: Next Medicare AWV with our clinical staff: 12/31/24 @ 10:10   Have you seen your provider in the last 6 months (3 months if uncontrolled diabetes)? Yes Next Office Visit with your provider: 04/21/24  Clinician Recommendations:  Aim for 30 minutes of exercise or brisk walking, 6-8 glasses of water, and 5 servings of fruits and vegetables each day.       This is a list of the screening recommended for you and due dates:  Health Maintenance  Topic Date Due   COVID-19 Vaccine (7 - 2024-25 season) 11/02/2023   Mammogram  01/23/2024   Flu Shot  03/12/2024   DTaP/Tdap/Td vaccine (3 - Td or Tdap) 05/11/2024   Medicare Annual Wellness Visit  12/28/2024   Colon Cancer Screening  04/28/2026   Pneumonia Vaccine  Completed   DEXA scan (bone density measurement)  Completed   Hepatitis C Screening  Completed   Zoster (Shingles) Vaccine  Completed   HPV Vaccine  Aged Out   Meningitis B Vaccine  Aged Out    Advanced directives: (In Chart) A copy of your advanced directives are scanned into your chart should your provider ever need it. Advance Care Planning is important because it:  [x]  Makes sure you receive the medical care that is consistent with your values, goals, and preferences  [x]  It provides guidance to your family and loved ones and reduces their decisional burden about whether or not they are making the right decisions based on your  wishes.  Follow the link provided in your after visit summary or read over the paperwork we have mailed to you to help you started getting your Advance Directives in place. If you need assistance in completing these, please reach out to us  so that we can help you!

## 2023-12-29 NOTE — Telephone Encounter (Signed)
 LMTCB. Please relay the message below to pt.

## 2023-12-30 NOTE — Telephone Encounter (Signed)
 Pt is aware.

## 2024-01-12 ENCOUNTER — Encounter: Payer: Self-pay | Admitting: Internal Medicine

## 2024-01-12 ENCOUNTER — Ambulatory Visit (INDEPENDENT_AMBULATORY_CARE_PROVIDER_SITE_OTHER): Admitting: Internal Medicine

## 2024-01-12 VITALS — BP 168/70 | HR 85 | Ht 62.0 in | Wt 134.8 lb

## 2024-01-12 DIAGNOSIS — R053 Chronic cough: Secondary | ICD-10-CM

## 2024-01-12 DIAGNOSIS — J189 Pneumonia, unspecified organism: Secondary | ICD-10-CM

## 2024-01-12 DIAGNOSIS — I10 Essential (primary) hypertension: Secondary | ICD-10-CM

## 2024-01-12 MED ORDER — TRIAMCINOLONE ACETONIDE 0.1 % EX CREA: 1.0000 | TOPICAL_CREAM | Freq: Two times a day (BID) | CUTANEOUS | 0 refills | Status: AC

## 2024-01-12 MED ORDER — SIMVASTATIN 20 MG PO TABS: 20.0000 mg | ORAL_TABLET | Freq: Every evening | ORAL | 3 refills | Status: AC

## 2024-01-12 MED ORDER — TELMISARTAN 40 MG PO TABS: 40.0000 mg | ORAL_TABLET | Freq: Every day | ORAL | 3 refills | Status: DC

## 2024-01-12 MED ORDER — DOXYCYCLINE HYCLATE 100 MG PO TABS: 100.0000 mg | ORAL_TABLET | Freq: Two times a day (BID) | ORAL | 0 refills | Status: DC

## 2024-01-12 NOTE — Assessment & Plan Note (Addendum)
-  advised to increase telmisartan  to 80 mg daily based on elevated readings from home reviewed today

## 2024-01-12 NOTE — Patient Instructions (Addendum)
 1) BP:  Increase your telmisartan  dose to 80 mg daily as a trial .  We want your blood pressure to be less than 130/80  2) rash:  I am prescribing triamcinolone steroid ointment for your rash on our chest wall   3) Bowels: You can use citrucel or fiber supplement every day up to 2 times daily  Do not use more than 200 mg  (2 capsules) of colace daily    4) [facility] Spotted Fever  can present with fever and a headache, NOT a rash;  the occurrence of these symptoms during spring/summer/fall if you have had recent tick exposure is RMSF until proven otherwise and should be treated immediately with doxycycline .  I have sent this rx to your pharmacy to use if you develop these symptoms.  Let me know if this occurs so we can set you up for the appropriate testing .

## 2024-01-12 NOTE — Progress Notes (Signed)
 Subjective:  Patient ID: Crystal Kidd, female    DOB: [birth year]  Age: 78 y.o. MRN: [id number]  CC: The primary encounter diagnosis was Community acquired pneumonia of right middle lobe of lung. Diagnoses of White coat syndrome with hypertension and Persistent cough for 3 weeks or longer were also pertinent to this visit.   HPI Crystal Kidd presents for  Chief Complaint  Patient presents with   Medical Management of Chronic Issues    Follow up on blood pressure   1) Hypertension: patient checking BP daily at home .  Readings have been  >  130/80 at rest . Patient is following a reduced salt diet most days and is taking medications as prescribed (5 mg amlodipine  stopped,  still taking 40 mg  telmisartan  ) since April visit   2)  irregular bowel movements.   3) history of pneumonia; diagnosed in march ith chest x ray noting bilateral LL infiltrates.  Her fatigue has resolved. Repeat chest x ray needed   Outpatient Medications Prior to Visit  Medication Sig Dispense Refill   Alum Hydroxide-Mag Carbonate (GAVISCON PO) Take 1 capsule by mouth daily.     benzonatate  (TESSALON ) 200 MG capsule Take 1 capsule (200 mg total) by mouth 3 (three) times daily as needed for cough. 60 capsule 1   Calcium -Phosphorus-Vitamin D  (CALCIUM  GUMMIES PO) Take by mouth.     cholecalciferol (VITAMIN D3) 25 MCG (1000 UNIT) tablet Take 1,000 Units by mouth daily.     esomeprazole  (NEXIUM ) 40 MG capsule Take 1 capsule (40 mg total) by mouth daily. 90 capsule 1   famotidine  (PEPCID ) 20 MG tablet TAKE 1 TABLET (20 MG TOTAL) BY MOUTH DAILY PRIOR TO DINNER 90 tablet 1   gabapentin  (NEURONTIN ) 100 MG capsule TAKE 1 CAPSULE (100 MG TOTAL) BY MOUTH 3 (THREE) TIMES DAILY. EVERY 8 HOURS AS NEEDED 90 capsule 1   Inulin (FIBER CHOICE PO) Take 1 tablet by mouth daily as needed.     levothyroxine  (SYNTHROID ) 75 MCG tablet TAKE 1 TABLET EVERY DAY BEFORE BREAKFAST 90 tablet 3   loratadine  (CLARITIN ) 10 MG tablet Take 10 mg  by mouth in the morning and at bedtime.     ondansetron  (ZOFRAN -ODT) 4 MG disintegrating tablet TAKE 1 TABLET BY MOUTH EVERY 8 HOURS AS NEEDED FOR NAUSEA AND VOMITING 20 tablet 3   Peppermint Oil (IBGARD PO) Take by mouth.     Polyethyl Glycol-Propyl Glycol (SYSTANE OP) Apply to eye. As needed     raloxifene  (EVISTA ) 60 MG tablet Take 1 tablet (60 mg total) by mouth daily. 90 tablet 1   simvastatin  (ZOCOR ) 20 MG tablet TAKE 1 TABLET EVERY EVENING 90 tablet 3   telmisartan  (MICARDIS ) 40 MG tablet TAKE 1 TABLET BY MOUTH EVERY DAY 90 tablet 3   amLODipine  (NORVASC ) 5 MG tablet Take 1 tablet (5 mg total) by mouth daily. (Patient not taking: Reported on 01/12/2024) 90 tablet 3   carboxymethylcellul-glycerin (REFRESH OPTIVE) 0.5-0.9 % ophthalmic solution Place 1 drop into both eyes as needed for dry eyes. (Patient not taking: Reported on 01/12/2024)     No facility-administered medications prior to visit.    Review of Systems;  Patient denies headache, fevers, malaise, unintentional weight loss, skin rash, eye pain, sinus congestion and sinus pain, sore throat, dysphagia,  hemoptysis , cough, dyspnea, wheezing, chest pain, palpitations, orthopnea, edema, abdominal pain, nausea, melena, diarrhea, constipation, flank pain, dysuria, hematuria, urinary  Frequency, nocturia, numbness, tingling, seizures,  Focal weakness, Loss of  consciousness,  Tremor, insomnia, depression, anxiety, and suicidal ideation.      Objective:  BP (!) 168/70   Pulse 85   Ht 5\' 2"  (1.575 m)   Wt 134 lb 12.8 oz (61.1 kg)   SpO2 96%   BMI 24.66 kg/m   BP Readings from Last 3 Encounters:  01/12/24 (!) 168/70  12/29/23 137/70  11/12/23 (!) 144/82    Wt Readings from Last 3 Encounters:  01/12/24 134 lb 12.8 oz (61.1 kg)  12/29/23 132 lb (59.9 kg)  11/12/23 132 lb (59.9 kg)    Physical Exam Vitals reviewed.  Constitutional:      General: She is not in acute distress.    Appearance: Normal appearance. She is normal  weight. She is not ill-appearing, toxic-appearing or diaphoretic.  HENT:     Head: Normocephalic.  Eyes:     General: No scleral icterus.       Right eye: No discharge.        Left eye: No discharge.     Conjunctiva/sclera: Conjunctivae normal.  Cardiovascular:     Rate and Rhythm: Normal rate and regular rhythm.     Heart sounds: Normal heart sounds.  Pulmonary:     Effort: Pulmonary effort is normal. No respiratory distress.     Breath sounds: Normal breath sounds.  Musculoskeletal:        General: Normal range of motion.  Skin:    General: Skin is warm and dry.     Comments: Scattered areas of Loss of pigmentation c/w vitiligo  Neurological:     General: No focal deficit present.     Mental Status: She is alert and oriented to person, place, and time. Mental status is at baseline.  Psychiatric:        Mood and Affect: Mood normal.        Behavior: Behavior normal.        Thought Content: Thought content normal.        Judgment: Judgment normal.    Lab Results  Component Value Date   HGBA1C 5.6 10/20/2023   HGBA1C 5.4 02/08/2021   HGBA1C 5.5 06/20/2017    Lab Results  Component Value Date   CREATININE 0.81 11/04/2023   CREATININE 0.73 10/20/2023   CREATININE 0.76 04/21/2023    Lab Results  Component Value Date   WBC 4.8 04/21/2023   HGB 13.8 04/21/2023   HCT 42.1 04/21/2023   PLT 266.0 04/21/2023   GLUCOSE 93 11/04/2023   CHOL 140 10/20/2023   TRIG 78.0 10/20/2023   HDL 55.90 10/20/2023   LDLDIRECT 74.0 10/20/2023   LDLCALC 68 10/20/2023   ALT 14 10/20/2023   AST 20 10/20/2023   NA 139 11/04/2023   K 4.0 11/04/2023   CL 105 11/04/2023   CREATININE 0.81 11/04/2023   BUN 12 11/04/2023   CO2 29 11/04/2023   TSH 1.75 10/20/2023   HGBA1C 5.6 10/20/2023   MICROALBUR <0.7 10/20/2023    No results found.  Assessment & Plan:  .Community acquired pneumonia of right middle lobe of lung Assessment & Plan: -Patient completed empiric treatment with   Augmentin  and azithromycin  and feels much better.  repeat chest x-ray  is needed  to ensure resolution of infiltrates.  Orders: -     DG Chest 2 View; Future  White coat syndrome with hypertension Assessment & Plan: -advised to increase telmisartan  to 80 mg daily based on elevated readings from home reviewed today   Persistent cough for 3 weeks or  longer Assessment & Plan: Secondary to community acquired pneumonia. Confirmd by chest x ray. Now   Resolved.  Repeat films needed    Other orders -     Simvastatin ; Take 1 tablet (20 mg total) by mouth every evening.  Dispense: 90 tablet; Refill: 3 -     Telmisartan ; Take 1 tablet (40 mg total) by mouth daily.  Dispense: 90 tablet; Refill: 3 -     Triamcinolone Acetonide; Apply 1 Application topically 2 (two) times daily. To rash until resolved  Dispense: 80 g; Refill: 0 -     Doxycycline  Hyclate; Take 1 tablet (100 mg total) by mouth 2 (two) times daily.  Dispense: 14 tablet; Refill: 0     Follow-up: No follow-ups on file.   Thersia Flax, MD

## 2024-01-13 ENCOUNTER — Other Ambulatory Visit

## 2024-01-13 ENCOUNTER — Ambulatory Visit

## 2024-01-13 DIAGNOSIS — J189 Pneumonia, unspecified organism: Secondary | ICD-10-CM | POA: Diagnosis not present

## 2024-01-13 DIAGNOSIS — I700 Atherosclerosis of aorta: Secondary | ICD-10-CM | POA: Diagnosis not present

## 2024-01-13 DIAGNOSIS — R918 Other nonspecific abnormal finding of lung field: Secondary | ICD-10-CM | POA: Diagnosis not present

## 2024-01-13 DIAGNOSIS — R9389 Abnormal findings on diagnostic imaging of other specified body structures: Secondary | ICD-10-CM | POA: Diagnosis not present

## 2024-01-13 NOTE — Assessment & Plan Note (Addendum)
 Secondary to community acquired pneumonia. Confirmd by chest x ray. Now   Resolved.  Repeat films needed

## 2024-01-13 NOTE — Assessment & Plan Note (Signed)
-  Patient completed empiric treatment with  Augmentin  and azithromycin  and feels much better.  repeat chest x-ray  is needed  to ensure resolution of infiltrates.

## 2024-01-20 ENCOUNTER — Ambulatory Visit: Payer: Self-pay | Admitting: Internal Medicine

## 2024-01-27 ENCOUNTER — Ambulatory Visit
Admission: RE | Admit: 2024-01-27 | Discharge: 2024-01-27 | Disposition: A | Discharge status: Home / Self Care | Source: Ambulatory Visit | Attending: Internal Medicine | Admitting: Internal Medicine

## 2024-01-27 DIAGNOSIS — Z1231 Encounter for screening mammogram for malignant neoplasm of breast: Secondary | ICD-10-CM | POA: Insufficient documentation

## 2024-01-29 ENCOUNTER — Telehealth: Payer: Self-pay

## 2024-01-29 MED ORDER — TELMISARTAN 80 MG PO TABS: 80.0000 mg | ORAL_TABLET | Freq: Every day | ORAL | 1 refills | Status: DC

## 2024-01-29 NOTE — Telephone Encounter (Signed)
 Pt dropped off blood pressure readings for review. Readings are placed in the yellow folder. The readings are since she increased the Telmisartan  to 80 mg daily. Pt is wanting to know if she needs to continue the 80 mg Telmisartan  or if any other changes are needed.

## 2024-01-29 NOTE — Telephone Encounter (Signed)
 Copied from CRM [id number]. Topic: Clinical - Medication Question >> Jan 29, 2024 10:25 AM Marlan Silva wrote: Reason for CRM: Patient called in wanting to know if she should refill her BP medication or if Dr. Madelon Scheuermann wants her to stop taking it. Patient requesting a call back to [phone]. Call disconnected due to computer freezing. >> Jan 29, 2024 10:31 AM Marlan Silva wrote: Call was disconnected, patient called back and wanted to know if Dr. Madelon Scheuermann would be sending the same bp medicaiton or will she be changing it to something else. Patient can be reached at the number on file [phone].

## 2024-01-29 NOTE — Telephone Encounter (Signed)
 Pt is aware and gave a verbal understanding.

## 2024-01-29 NOTE — Addendum Note (Signed)
 Addended by: Thersia Flax on: 01/29/2024 02:01 PM   Modules accepted: Orders

## 2024-03-01 ENCOUNTER — Ambulatory Visit: Admitting: Internal Medicine

## 2024-03-01 ENCOUNTER — Ambulatory Visit: Payer: Self-pay

## 2024-03-01 VITALS — BP 112/72 | HR 80 | Temp 98.7°F | Ht 62.0 in | Wt 132.2 lb

## 2024-03-01 DIAGNOSIS — S9032XA Contusion of left foot, initial encounter: Secondary | ICD-10-CM

## 2024-03-01 DIAGNOSIS — E559 Vitamin D deficiency, unspecified: Secondary | ICD-10-CM | POA: Diagnosis not present

## 2024-03-01 DIAGNOSIS — R5383 Other fatigue: Secondary | ICD-10-CM

## 2024-03-01 DIAGNOSIS — I10 Essential (primary) hypertension: Secondary | ICD-10-CM | POA: Diagnosis not present

## 2024-03-01 NOTE — Assessment & Plan Note (Signed)
-   Patient has a history of vitamin D  deficiency and is on oral vitamin D  supplementation -Her last vitamin D  level was normal -Will recheck today given her fatigue -No further workup at this time

## 2024-03-01 NOTE — Assessment & Plan Note (Signed)
-   Patient complains of bruising over her left foot over the last couple of days -Ecchymosis noted on the medial aspect of her left foot near the first MTP joint -Ecchymosis is slowly improving -Patient denies any trauma but may have accidentally hit her foot while walking -No further workup at this time -If ecchymosis recurs or worsens without trauma would consider further workup

## 2024-03-01 NOTE — Assessment & Plan Note (Signed)
-   Patient complains of increased fatigue and somnolence during the day over the last couple weeks -Patient states that she normally sleeps around 7 to 8 hours and is usually awake during the day without any somnolence or fatigue -The last couple weeks she has noted some increased somnolence during the day and fatigue -Will check a vitamin B12, CBC and a TSH for further evaluation -If somnolence persists and these labs are normal would consider obtaining a sleep study for further evaluation -No further workup at this time

## 2024-03-01 NOTE — Patient Instructions (Signed)
-   Was a pleasure meeting you today -I suspect that your variable blood pressure readings are secondary to the blood pressure cuff is very different from the reading that we are getting when we do a blood pressure here for you -I would consider getting a new blood pressure cuff and not using this 1 -Continue using your current antihypertensive medication (telmisartan ) -As far as your sleepiness goes during the day I would like to check some blood work on you including a vitamin B12, blood counts, vitamin D  and a thyroid  function -I suspect the bruise on your foot should improve as time goes on.  If that does not heal appropriately please call us  so that we can evaluate you further

## 2024-03-01 NOTE — Telephone Encounter (Signed)
 Pt was seen by Dr. Narendra today

## 2024-03-01 NOTE — Progress Notes (Signed)
 Acute Office Visit  Subjective:     Patient ID: Crystal Kidd, female    DOB: 1945/12/09, 78 y.o.   MRN: [id number]  Chief Complaint  Patient presents with   Acute Visit    Fluctuating BP    HPI Patient is in today for fluctuation in her blood pressure readings at home.  Patient states that she got a new blood pressure cuff in June and has noted a variable blood pressure readings on the cuff with systolics ranging from the 90s to the 170s.  No headaches, no chest pain, no lightheadedness, no syncope.  Patient also complains of some increased somnolence over the last couple of weeks.  States that she is sleeping well at night but has noted feeling sleepy during the day especially over the last few days.  She also noted bruising over her left foot but states that this is improving.  She denies any known trauma but states that she may have accidentally hit it while walking.  Review of Systems  Constitutional:  Positive for malaise/fatigue.  HENT: Negative.    Respiratory: Negative.    Cardiovascular: Negative.   Gastrointestinal: Negative.   Musculoskeletal: Negative.   Neurological: Negative.   Psychiatric/Behavioral: Negative.          Objective:    BP 112/72   Pulse 80   Temp 98.7 F (37.1 C)   Ht 5' 2 (1.575 m)   Wt 132 lb 3.2 oz (60 kg)   SpO2 97%   BMI 24.18 kg/m    Physical Exam Constitutional:      Appearance: Normal appearance.  Cardiovascular:     Rate and Rhythm: Normal rate.     Heart sounds: Normal heart sounds.  Pulmonary:     Effort: No respiratory distress.     Breath sounds: No wheezing.  Musculoskeletal:        General: No swelling or tenderness.  Neurological:     Mental Status: She is alert and oriented to person, place, and time.  Psychiatric:        Mood and Affect: Mood normal.        Behavior: Behavior normal.     No results found for any visits on 03/01/24.      Assessment & Plan:   Problem List Items Addressed This  Visit       Cardiovascular and Mediastinum   White coat syndrome with hypertension   - Patient has variable blood pressures at home ranging from systolics 90s to the 170s.  She got a new blood pressure cuff in June and has noted variable blood pressure readings since then -She brought her blood pressure cuff to the appointment today and it was reading 182/92.  However manual blood pressure done here was 112/72.  While I was in the room with her she repeated it on her cough and had a blood pressure in the 170s over 90s but repeat manual blood pressure done by RN was systolics in the 120s. -I suspect that the reason for her blood pressure variability is secondary to inaccurate readings from her cuff especially since she has no other symptoms -I advised the patient to get a new cuff -Continue with current medications (telmisartan  80 mg daily).  No further workup at this time        Other   Fatigue - Primary   - Patient complains of increased fatigue and somnolence during the day over the last couple weeks -Patient states that she normally sleeps around  7 to 8 hours and is usually awake during the day without any somnolence or fatigue -The last couple weeks she has noted some increased somnolence during the day and fatigue -Will check a vitamin B12, CBC and a TSH for further evaluation -If somnolence persists and these labs are normal would consider obtaining a sleep study for further evaluation -No further workup at this time      Relevant Orders   CBC w/Diff   TSH   Vitamin B12   Traumatic ecchymosis of foot, left, initial encounter   - Patient complains of bruising over her left foot over the last couple of days -Ecchymosis noted on the medial aspect of her left foot near the first MTP joint -Ecchymosis is slowly improving -Patient denies any trauma but may have accidentally hit her foot while walking -No further workup at this time -If ecchymosis recurs or worsens without trauma  would consider further workup      Vitamin D  deficiency   - Patient has a history of vitamin D  deficiency and is on oral vitamin D  supplementation -Her last vitamin D  level was normal -Will recheck today given her fatigue -No further workup at this time      Relevant Orders   VITAMIN D  25 Hydroxy (Vit-D Deficiency, Fractures)    No orders of the defined types were placed in this encounter.   No follow-ups on file.  Daundre Biel, MD

## 2024-03-01 NOTE — Telephone Encounter (Signed)
 FYI Only or Action Required?: FYI only for provider.  Patient was last seen in primary care on 01/12/2024 by Marylynn Verneita CROME, MD.  Called Nurse Triage reporting Hypertension and Hypotension.  Symptoms began several weeks ago.  Interventions attempted: Prescription medications: telmisartan .  Symptoms are: unchanged.  Triage Disposition: See PCP When Office is Open (Within 3 Days)  Patient/caregiver understands and will follow disposition?: Yes                             Copied from CRM [id number]. Topic: Clinical - Red Word Triage >> Mar 01, 2024  8:52 AM Suzen RAMAN wrote: Red Word that prompted transfer to Nurse Triage:   last BP reading this morning 134/66... last night BP- 173/83 Reason for Disposition  Systolic BP >= 160 OR Diastolic >= 100  Answer Assessment - Initial Assessment Questions 1. BLOOD PRESSURE: What is your blood pressure? Did you take at least two measurements 5 minutes apart?     173/83 last night, 134/66 this morning before taking BP medication 2. ONSET: When did you take your blood pressure?     States BP has been fluctuating for a few weeks 3. HOW: How did you take your blood pressure? (e.g., automatic home BP monitor, visiting nurse)     Automatic cuff at home  4. HISTORY: Do you have a history of high blood pressure?     Yes  5. MEDICINES: Are you taking any medicines for blood pressure? Have you missed any doses recently?     Telmisartan  80 MG, states she started this medication in June 6. OTHER SYMPTOMS: Do you have any symptoms? (e.g., blurred vision, chest pain, difficulty breathing, headache, weakness)     Head and neck pain at baseline due to nerve issue, fatigue within past few weeks, dry eyes, denies difficulty breathing, denies chest pain  Protocols used: Blood Pressure - High-A-AH

## 2024-03-01 NOTE — Assessment & Plan Note (Signed)
-   Patient has variable blood pressures at home ranging from systolics 90s to the 170s.  She got a new blood pressure cuff in June and has noted variable blood pressure readings since then -She brought her blood pressure cuff to the appointment today and it was reading 182/92.  However manual blood pressure done here was 112/72.  While I was in the room with her she repeated it on her cough and had a blood pressure in the 170s over 90s but repeat manual blood pressure done by RN was systolics in the 120s. -I suspect that the reason for her blood pressure variability is secondary to inaccurate readings from her cuff especially since she has no other symptoms -I advised the patient to get a new cuff -Continue with current medications (telmisartan  80 mg daily).  No further workup at this time

## 2024-03-02 ENCOUNTER — Ambulatory Visit: Payer: Self-pay | Admitting: Internal Medicine

## 2024-03-02 LAB — CBC WITH DIFFERENTIAL/PLATELET
Basophils Absolute: 0.1 K/uL (ref 0.0–0.1)
Basophils Relative: 1.2 % (ref 0.0–3.0)
Eosinophils Absolute: 0.2 K/uL (ref 0.0–0.7)
Eosinophils Relative: 4.3 % (ref 0.0–5.0)
HCT: 38.9 % (ref 36.0–46.0)
Hemoglobin: 12.9 g/dL (ref 12.0–15.0)
Lymphocytes Relative: 21.6 % (ref 12.0–46.0)
Lymphs Abs: 1.1 K/uL (ref 0.7–4.0)
MCHC: 33.1 g/dL (ref 30.0–36.0)
MCV: 87.7 fl (ref 78.0–100.0)
Monocytes Absolute: 0.6 K/uL (ref 0.1–1.0)
Monocytes Relative: 11.5 % (ref 3.0–12.0)
Neutro Abs: 3.2 K/uL (ref 1.4–7.7)
Neutrophils Relative %: 61.4 % (ref 43.0–77.0)
Platelets: 257 K/uL (ref 150.0–400.0)
RBC: 4.44 Mil/uL (ref 3.87–5.11)
RDW: 14.8 % (ref 11.5–15.5)
WBC: 5.3 K/uL (ref 4.0–10.5)

## 2024-03-02 LAB — VITAMIN B12: Vitamin B-12: 452 pg/mL (ref 211–911)

## 2024-03-02 LAB — TSH: TSH: 3.42 u[IU]/mL (ref 0.35–5.50)

## 2024-03-02 LAB — VITAMIN D 25 HYDROXY (VIT D DEFICIENCY, FRACTURES): VITD: 41.02 ng/mL (ref 30.00–100.00)

## 2024-03-03 ENCOUNTER — Telehealth: Payer: Self-pay | Admitting: Internal Medicine

## 2024-03-03 ENCOUNTER — Other Ambulatory Visit: Payer: Self-pay | Admitting: Internal Medicine

## 2024-03-03 ENCOUNTER — Ambulatory Visit

## 2024-03-03 MED ORDER — AMLODIPINE BESYLATE 2.5 MG PO TABS: 2.5000 mg | ORAL_TABLET | Freq: Every day | ORAL | 1 refills | Status: DC

## 2024-03-03 NOTE — Telephone Encounter (Signed)
 Patient called office and would like to bring her new bp monitor into the office to have it collaborated. Do we need orders for this? Let front office know so we can schedule a nurse visit.  Thanks

## 2024-03-03 NOTE — Telephone Encounter (Signed)
 noted

## 2024-03-03 NOTE — Telephone Encounter (Signed)
 Lm for patient to call office and schedule a nurse visit to have her new bp monitor collaborated.   E2C2 please schedule a 30 minute nurse visit for patient. Thank you

## 2024-03-03 NOTE — Telephone Encounter (Signed)
 Pt seen in office for nurse visit today 03/03/2024

## 2024-03-03 NOTE — Progress Notes (Signed)
 Patient here for nurse visit BP check per order from Dr. Marylynn.   Patient reports compliance with prescribed BP medications: yes  Last dose of BP medication:   BP Readings from Last 3 Encounters:  03/03/24 (!) 182/77  03/01/24 112/72  01/12/24 (!) 168/70   Pulse Readings from Last 3 Encounters:  03/03/24 85  03/01/24 80  01/12/24 85    Per Dr. Marylynn : Start 2.5 MG Amlodipine  daily, and send us  a reading with her BP  via mychart.  Patient verbalized understanding of instructions.   Burnard Plank, CMA

## 2024-03-08 ENCOUNTER — Telehealth: Payer: Self-pay | Admitting: Internal Medicine

## 2024-03-08 DIAGNOSIS — I10 Essential (primary) hypertension: Secondary | ICD-10-CM

## 2024-03-08 NOTE — Telephone Encounter (Signed)
 Placed in yellow results folder for review.

## 2024-03-08 NOTE — Telephone Encounter (Signed)
 LMTCB. Please let pt know that bp readings are normal and no changes are needed at this time.

## 2024-03-08 NOTE — Telephone Encounter (Signed)
 Pt stopped in to drop off BP reading list for Dr Marylynn. It's in her color folder up front. [facility]

## 2024-03-08 NOTE — Assessment & Plan Note (Signed)
 Home readings checked daily from 7/23 to 7/28 were all <140/90 AND as low as 115/59 .  No changes to regimen

## 2024-03-09 NOTE — Telephone Encounter (Signed)
 Pt is aware and gave a verbal understanding.

## 2024-04-21 ENCOUNTER — Encounter: Payer: Self-pay | Admitting: Internal Medicine

## 2024-04-21 ENCOUNTER — Ambulatory Visit: Admitting: Internal Medicine

## 2024-04-21 VITALS — BP 137/63 | HR 79 | Ht 62.0 in | Wt 133.6 lb

## 2024-04-21 DIAGNOSIS — Z0000 Encounter for general adult medical examination without abnormal findings: Secondary | ICD-10-CM | POA: Diagnosis not present

## 2024-04-21 DIAGNOSIS — J189 Pneumonia, unspecified organism: Secondary | ICD-10-CM | POA: Diagnosis not present

## 2024-04-21 DIAGNOSIS — R1319 Other dysphagia: Secondary | ICD-10-CM | POA: Diagnosis not present

## 2024-04-21 DIAGNOSIS — L600 Ingrowing nail: Secondary | ICD-10-CM

## 2024-04-21 DIAGNOSIS — K2100 Gastro-esophageal reflux disease with esophagitis, without bleeding: Secondary | ICD-10-CM | POA: Diagnosis not present

## 2024-04-21 DIAGNOSIS — E785 Hyperlipidemia, unspecified: Secondary | ICD-10-CM | POA: Diagnosis not present

## 2024-04-21 LAB — COMPREHENSIVE METABOLIC PANEL WITH GFR
ALT: 10 U/L (ref 0–35)
AST: 15 U/L (ref 0–37)
Albumin: 3.9 g/dL (ref 3.5–5.2)
Alkaline Phosphatase: 95 U/L (ref 39–117)
BUN: 11 mg/dL (ref 6–23)
CO2: 30 meq/L (ref 19–32)
Calcium: 9.4 mg/dL (ref 8.4–10.5)
Chloride: 104 meq/L (ref 96–112)
Creatinine, Ser: 0.73 mg/dL (ref 0.40–1.20)
GFR: 78.54 mL/min (ref >60.00)
Glucose, Bld: 79 mg/dL (ref 70–99)
Potassium: 4.5 meq/L (ref 3.5–5.1)
Sodium: 139 meq/L (ref 135–145)
Total Bilirubin: 0.6 mg/dL (ref 0.2–1.2)
Total Protein: 6.5 g/dL (ref 6.0–8.3)

## 2024-04-21 LAB — LIPID PANEL
Cholesterol: 135 mg/dL (ref 0–200)
HDL: 50.6 mg/dL (ref >39.00)
LDL Cholesterol: 69 mg/dL (ref 0–99)
NonHDL: 84.25
Total CHOL/HDL Ratio: 3
Triglycerides: 76 mg/dL (ref 0.0–149.0)
VLDL: 15.2 mg/dL (ref 0.0–40.0)

## 2024-04-21 LAB — LDL CHOLESTEROL, DIRECT: Direct LDL: 77 mg/dL

## 2024-04-21 MED ORDER — RALOXIFENE HCL 60 MG PO TABS: 60.0000 mg | ORAL_TABLET | Freq: Every day | ORAL | 1 refills | Status: AC

## 2024-04-21 MED ORDER — TELMISARTAN 80 MG PO TABS: 80.0000 mg | ORAL_TABLET | Freq: Every day | ORAL | 1 refills | Status: AC

## 2024-04-21 MED ORDER — OMEPRAZOLE 40 MG PO CPDR: DELAYED_RELEASE_CAPSULE | ORAL | 3 refills | Status: DC

## 2024-04-21 NOTE — Assessment & Plan Note (Signed)
 Repeat chest film in June showed resolution of bilateral opacities.

## 2024-04-21 NOTE — Patient Instructions (Signed)
 For your reflux:   I have changed your regimen.  Stop the nexium  and  famotidine  Use omeprazole  40 mg twice daily instead  If symptoms do not resolve in 2 weeks,  let me know    You are due for your tetanus-diptheria-pertussis vaccine   (TDaP)  at the end of the month,    You can  get it at your pharmacy for FREE  I WOULD POSTPONE THE FLU VACCINE UNTIL MID OCTOBER

## 2024-04-21 NOTE — Assessment & Plan Note (Signed)

## 2024-04-21 NOTE — Progress Notes (Unsigned)
 Patient ID: Crystal Kidd, female    DOB: June 11, 1946  Age: 78 y.o. MRN: [id number]  The patient is here for annual preventive examination and management of other chronic and acute problems.   The risk factors are reflected in the social history.   The roster of all physicians providing medical care to patient - is listed in the Snapshot section of the chart.   Activities of daily living:  The patient is 100% independent in all ADLs: dressing, toileting, feeding as well as independent mobility   Home safety : The patient has smoke detectors in the home. They wear seatbelts.  There are no unsecured firearms at home. There is no violence in the home.    There is no risks for hepatitis, STDs or HIV. There is no   history of blood transfusion. They have no travel history to infectious disease endemic areas of the world.   The patient has seen their dentist in the last six month. They have seen their eye doctor in the last year. The patinet  denies slight hearing difficulty with regard to whispered voices and some television programs.  They have deferred audiologic testing in the last year.  They do not  have excessive sun exposure. Discussed the need for sun protection: hats, long sleeves and use of sunscreen if there is significant sun exposure.    Diet: the importance of a healthy diet is discussed. They do have a healthy diet.   The benefits of regular aerobic exercise were discussed. The patient  exercises  3 to 5 days per week  for  60 minutes.    Depression screen: there are no signs or vegative symptoms of depression- irritability, change in appetite, anhedonia, sadness/tearfullness.   The following portions of the patient's history were reviewed and updated as appropriate: allergies, current medications, past family history, past medical history,  past surgical history, past social history  and problem list.   Visual acuity was not assessed per patient preference since the patient has  regular follow up with an  ophthalmologist. Hearing and body mass index were assessed and reviewed.    During the course of the visit the patient was educated and counseled about appropriate screening and preventive services including : fall prevention , diabetes screening, nutrition counseling, colorectal cancer screening, and recommended immunizations.    Chief Complaint:   1) GERD  taking famotidine  40 pre dinner and nexium  in the AM alone.  Symptoms have been worse for the last several months  develops a full feeling in her throat,  then gets nauseated ,  has not lost weight    Review of Symptoms  Patient denies headache, fevers, malaise, unintentional weight loss, skin rash, eye pain, sinus congestion and sinus pain, sore throat, dysphagia,  hemoptysis , cough, dyspnea, wheezing, chest pain, palpitations, orthopnea, edema, abdominal pain, nausea, melena, diarrhea, constipation, flank pain, dysuria, hematuria, urinary  Frequency, nocturia, numbness, tingling, seizures,  Focal weakness, Loss of consciousness,  Tremor, insomnia, depression, anxiety, and suicidal ideation.    Physical Exam:  BP 137/63   Pulse 79   Ht 5' 2 (1.575 m)   Wt 133 lb 9.6 oz (60.6 kg)   SpO2 96%   BMI 24.44 kg/m    Physical Exam Vitals reviewed.  Constitutional:      General: She is not in acute distress.    Appearance: Normal appearance. She is normal weight. She is not ill-appearing, toxic-appearing or diaphoretic.  HENT:     Head: Normocephalic.  Eyes:     General: No scleral icterus.       Right eye: No discharge.        Left eye: No discharge.     Conjunctiva/sclera: Conjunctivae normal.  Cardiovascular:     Rate and Rhythm: Normal rate and regular rhythm.     Heart sounds: Normal heart sounds.  Pulmonary:     Effort: Pulmonary effort is normal. No respiratory distress.     Breath sounds: Normal breath sounds.  Musculoskeletal:        General: Normal range of motion.  Skin:    General: Skin  is warm and dry.  Neurological:     General: No focal deficit present.     Mental Status: She is alert and oriented to person, place, and time. Mental status is at baseline.  Psychiatric:        Mood and Affect: Mood normal.        Behavior: Behavior normal.        Thought Content: Thought content normal.        Judgment: Judgment normal.     Assessment and Plan: Hyperlipidemia LDL goal <100 -     Lipid panel -     LDL cholesterol, direct -     Comprehensive metabolic panel with GFR  Encounter for preventive health examination Assessment & Plan: age appropriate education and counseling updated, referrals for preventative services and immunizations addressed, dietary and smoking counseling addressed, most recent labs reviewed.  I have personally reviewed and have noted:   1) the patient's medical and social history 2) The pt's use of alcohol , tobacco, and illicit drugs 3) The patient's current medications and supplements 4) Functional ability including ADL's, fall risk, home safety risk, hearing and visual impairment 5) Diet and physical activities 6) Evidence for depression or mood disorder 7) The patient's height, weight, and BMI have been recorded in the chart   I have made referrals, and provided counseling and education based on review of the above    Community acquired pneumonia, unspecified laterality Assessment & Plan: Repeat chest film in June showed resolution of bilateral opacities.    Ingrown toenail of left foot -     Ambulatory referral to Podiatry  Esophageal dysphagia Assessment & Plan: Subjective, with no evidence of stricture on last EGD and last  DG esophagus.  She declined to swallow the barium tablet. disordered peristalsis noted on swallow evaluation .    Gastroesophageal reflux disease with esophagitis without hemorrhage Assessment & Plan: Changiing  her dication regimen to PPI bid as a trial due to increased symptoms of reflux    Other  orders -     Raloxifene  HCl; Take 1 tablet (60 mg total) by mouth daily.  Dispense: 90 tablet; Refill: 1 -     Telmisartan ; Take 1 tablet (80 mg total) by mouth daily.  Dispense: 90 tablet; Refill: 1 -     Omeprazole ; 2 Times daily 30 minutes prior to meals  Dispense: 60 capsule; Refill: 3    Return in about 6 months (around 10/19/2024).  Verneita LITTIE Kettering, MD

## 2024-04-22 NOTE — Assessment & Plan Note (Addendum)
 Subjective, with no evidence of stricture on last EGD and last  DG esophagus.  She declined to swallow the barium tablet. disordered peristalsis noted on swallow evaluation .

## 2024-04-22 NOTE — Assessment & Plan Note (Signed)
 Changiing  her dication regimen to PPI bid as a trial due to increased symptoms of reflux

## 2024-04-23 ENCOUNTER — Ambulatory Visit: Payer: Self-pay | Admitting: Internal Medicine

## 2024-04-28 ENCOUNTER — Ambulatory Visit: Admitting: Podiatry

## 2024-04-28 DIAGNOSIS — L600 Ingrowing nail: Secondary | ICD-10-CM

## 2024-05-01 NOTE — Progress Notes (Signed)
  Subjective:  Patient ID: Crystal Kidd, female    DOB: May 02, 1946,  MRN: [id number]  Chief Complaint  Patient presents with   Nail Problem    Ingrown Toenail of Left Foot. NO pain. Does not want any procedure today. Has a wedding to attend.    78 y.o. female presents with the above complaint. History confirmed with patient.   Objective:  Physical Exam: warm, good capillary refill, no trophic changes or ulcerative lesions, normal DP and PT pulses, normal sensory exam, and slight pain on nail with dystrophy.   Assessment:  No diagnosis found.   Plan:  Patient was evaluated and treated and all questions answered.  Debrided the nail at her request today, discussed with her treatment options for removing ingrown toenails including permanent partial or total matricectomy.  We discussed the risks and benefits of this.  She will return to see me as needed for this in the future if she needs it.  No follow-ups on file.

## 2024-05-26 ENCOUNTER — Ambulatory Visit (INDEPENDENT_AMBULATORY_CARE_PROVIDER_SITE_OTHER)

## 2024-05-26 DIAGNOSIS — Z23 Encounter for immunization: Secondary | ICD-10-CM

## 2024-05-26 NOTE — Progress Notes (Signed)
 Patient was administered a High dose flu vaccine into her left deltoid. Patient tolerated the High dose flu vaccine well.

## 2024-06-04 NOTE — Progress Notes (Signed)
 Crystal Kidd                                          MRN: [id number]   06/04/2024   The [facility] Quality Team Specialist reviewed this patient medical record for the purposes of chart review for care gap closure. The following were reviewed: abstraction for care gap closure-controlling blood pressure.    [facility] Quality Team

## 2024-06-30 NOTE — Telephone Encounter (Signed)
 open in error

## 2024-07-16 ENCOUNTER — Other Ambulatory Visit: Payer: Self-pay | Admitting: Internal Medicine

## 2024-10-19 ENCOUNTER — Ambulatory Visit: Admitting: Internal Medicine

## 2024-12-31 ENCOUNTER — Ambulatory Visit
